# Patient Record
Sex: Female | Born: 1960
Health system: Southern US, Community
[De-identification: ages and names within clinical notes are randomized; demographics above are authoritative.]

## PROBLEM LIST (undated history)

## (undated) DIAGNOSIS — E785 Hyperlipidemia, unspecified: Secondary | ICD-10-CM

## (undated) DIAGNOSIS — M1990 Unspecified osteoarthritis, unspecified site: Secondary | ICD-10-CM

## (undated) DIAGNOSIS — J45909 Unspecified asthma, uncomplicated: Secondary | ICD-10-CM

## (undated) DIAGNOSIS — I471 Supraventricular tachycardia: Secondary | ICD-10-CM

## (undated) DIAGNOSIS — E1169 Type 2 diabetes mellitus with other specified complication: Secondary | ICD-10-CM

## (undated) DIAGNOSIS — I499 Cardiac arrhythmia, unspecified: Secondary | ICD-10-CM

## (undated) DIAGNOSIS — I491 Atrial premature depolarization: Secondary | ICD-10-CM

## (undated) DIAGNOSIS — I4719 Other supraventricular tachycardia: Secondary | ICD-10-CM

## (undated) DIAGNOSIS — I493 Ventricular premature depolarization: Secondary | ICD-10-CM

## (undated) DIAGNOSIS — G4733 Obstructive sleep apnea (adult) (pediatric): Secondary | ICD-10-CM

## (undated) DIAGNOSIS — I700 Atherosclerosis of aorta: Secondary | ICD-10-CM

## (undated) DIAGNOSIS — I2510 Atherosclerotic heart disease of native coronary artery without angina pectoris: Secondary | ICD-10-CM

## (undated) DIAGNOSIS — K760 Fatty (change of) liver, not elsewhere classified: Secondary | ICD-10-CM

## (undated) DIAGNOSIS — D219 Benign neoplasm of connective and other soft tissue, unspecified: Secondary | ICD-10-CM

## (undated) DIAGNOSIS — R0600 Dyspnea, unspecified: Secondary | ICD-10-CM

## (undated) DIAGNOSIS — Z9989 Dependence on other enabling machines and devices: Secondary | ICD-10-CM

## (undated) DIAGNOSIS — I10 Essential (primary) hypertension: Secondary | ICD-10-CM

## (undated) DIAGNOSIS — E669 Obesity, unspecified: Secondary | ICD-10-CM

## (undated) DIAGNOSIS — I5032 Chronic diastolic (congestive) heart failure: Secondary | ICD-10-CM

## (undated) HISTORY — DX: Obstructive sleep apnea (adult) (pediatric): Z99.89

## (undated) HISTORY — DX: Benign neoplasm of connective and other soft tissue, unspecified: D21.9

## (undated) HISTORY — DX: Atherosclerotic heart disease of native coronary artery without angina pectoris: I25.10

## (undated) HISTORY — PX: DILATION AND CURETTAGE OF UTERUS: SHX78

## (undated) HISTORY — DX: Fatty (change of) liver, not elsewhere classified: K76.0

## (undated) HISTORY — DX: Obesity, unspecified: E66.9

## (undated) HISTORY — DX: Morbid (severe) obesity due to excess calories: E66.01

## (undated) HISTORY — DX: Atrial premature depolarization: I49.1

## (undated) HISTORY — PX: OTHER SURGICAL HISTORY: SHX169

## (undated) HISTORY — PX: HYSTEROSCOPY: SHX211

## (undated) HISTORY — DX: Hyperlipidemia, unspecified: E78.5

## (undated) HISTORY — DX: Atherosclerosis of aorta: I70.0

## (undated) HISTORY — DX: Chronic diastolic (congestive) heart failure: I50.32

## (undated) HISTORY — DX: Unspecified asthma, uncomplicated: J45.909

## (undated) HISTORY — PX: TONSILLECTOMY: SUR1361

## (undated) HISTORY — DX: Other supraventricular tachycardia: I47.19

## (undated) HISTORY — DX: Supraventricular tachycardia: I47.1

## (undated) HISTORY — DX: Essential (primary) hypertension: I10

## (undated) HISTORY — DX: Type 2 diabetes mellitus with other specified complication: E11.69

## (undated) HISTORY — DX: Obstructive sleep apnea (adult) (pediatric): G47.33

## (undated) HISTORY — DX: Ventricular premature depolarization: I49.3

---

## 1998-02-10 ENCOUNTER — Other Ambulatory Visit: Admission: RE | Admit: 1998-02-10 | Discharge: 1998-02-10 | Payer: Self-pay | Admitting: Gynecology

## 1999-05-04 ENCOUNTER — Other Ambulatory Visit: Admission: RE | Admit: 1999-05-04 | Discharge: 1999-05-04 | Payer: Self-pay | Admitting: Gynecology

## 1999-05-05 ENCOUNTER — Encounter (INDEPENDENT_AMBULATORY_CARE_PROVIDER_SITE_OTHER): Payer: Self-pay

## 1999-05-05 ENCOUNTER — Ambulatory Visit (HOSPITAL_COMMUNITY): Admission: RE | Admit: 1999-05-05 | Discharge: 1999-05-05 | Payer: Self-pay | Admitting: Gynecology

## 1999-10-12 ENCOUNTER — Other Ambulatory Visit: Admission: RE | Admit: 1999-10-12 | Discharge: 1999-10-12 | Payer: Self-pay | Admitting: Gynecology

## 2000-05-04 ENCOUNTER — Other Ambulatory Visit: Admission: RE | Admit: 2000-05-04 | Discharge: 2000-05-04 | Payer: Self-pay | Admitting: Gynecology

## 2001-05-07 ENCOUNTER — Other Ambulatory Visit: Admission: RE | Admit: 2001-05-07 | Discharge: 2001-05-07 | Payer: Self-pay | Admitting: Gynecology

## 2002-05-08 ENCOUNTER — Other Ambulatory Visit: Admission: RE | Admit: 2002-05-08 | Discharge: 2002-05-08 | Payer: Self-pay | Admitting: Gynecology

## 2002-10-03 ENCOUNTER — Other Ambulatory Visit: Admission: RE | Admit: 2002-10-03 | Discharge: 2002-10-03 | Payer: Self-pay | Admitting: Gynecology

## 2003-05-12 ENCOUNTER — Other Ambulatory Visit: Admission: RE | Admit: 2003-05-12 | Discharge: 2003-05-12 | Payer: Self-pay | Admitting: Gynecology

## 2004-05-19 ENCOUNTER — Other Ambulatory Visit: Admission: RE | Admit: 2004-05-19 | Discharge: 2004-05-19 | Payer: Self-pay | Admitting: Gynecology

## 2004-08-09 ENCOUNTER — Encounter: Admission: RE | Admit: 2004-08-09 | Discharge: 2004-08-09 | Payer: Self-pay | Admitting: Orthopedic Surgery

## 2005-06-01 ENCOUNTER — Other Ambulatory Visit: Admission: RE | Admit: 2005-06-01 | Discharge: 2005-06-01 | Payer: Self-pay | Admitting: Gynecology

## 2006-06-02 ENCOUNTER — Other Ambulatory Visit: Admission: RE | Admit: 2006-06-02 | Discharge: 2006-06-02 | Payer: Self-pay | Admitting: Gynecology

## 2007-06-06 ENCOUNTER — Encounter: Admission: RE | Admit: 2007-06-06 | Discharge: 2007-06-06 | Payer: Self-pay | Admitting: Orthopedic Surgery

## 2007-07-20 ENCOUNTER — Other Ambulatory Visit: Admission: RE | Admit: 2007-07-20 | Discharge: 2007-07-20 | Payer: Self-pay | Admitting: Gynecology

## 2007-10-16 ENCOUNTER — Encounter: Payer: Self-pay | Admitting: Pulmonary Disease

## 2007-10-22 ENCOUNTER — Encounter: Payer: Self-pay | Admitting: Pulmonary Disease

## 2007-10-30 ENCOUNTER — Telehealth: Payer: Self-pay | Admitting: Pulmonary Disease

## 2007-12-09 ENCOUNTER — Encounter: Payer: Self-pay | Admitting: Pulmonary Disease

## 2007-12-10 ENCOUNTER — Encounter: Payer: Self-pay | Admitting: Pulmonary Disease

## 2008-01-22 ENCOUNTER — Encounter: Payer: Self-pay | Admitting: Pulmonary Disease

## 2008-01-31 ENCOUNTER — Ambulatory Visit: Payer: Self-pay | Admitting: Pulmonary Disease

## 2008-01-31 DIAGNOSIS — Q676 Pectus excavatum: Secondary | ICD-10-CM | POA: Insufficient documentation

## 2008-01-31 DIAGNOSIS — R0602 Shortness of breath: Secondary | ICD-10-CM | POA: Insufficient documentation

## 2008-02-06 ENCOUNTER — Telehealth (INDEPENDENT_AMBULATORY_CARE_PROVIDER_SITE_OTHER): Payer: Self-pay | Admitting: *Deleted

## 2008-02-18 ENCOUNTER — Telehealth (INDEPENDENT_AMBULATORY_CARE_PROVIDER_SITE_OTHER): Payer: Self-pay | Admitting: *Deleted

## 2008-03-26 ENCOUNTER — Ambulatory Visit: Payer: Self-pay | Admitting: Women's Health

## 2008-04-03 ENCOUNTER — Ambulatory Visit: Payer: Self-pay | Admitting: Women's Health

## 2008-05-06 ENCOUNTER — Ambulatory Visit: Payer: Self-pay | Admitting: Pulmonary Disease

## 2008-06-20 HISTORY — PX: DILATION AND CURETTAGE OF UTERUS: SHX78

## 2008-06-20 HISTORY — PX: HYSTEROSCOPY: SHX211

## 2008-06-20 HISTORY — PX: ABDOMINAL HYSTERECTOMY: SHX81

## 2008-07-28 ENCOUNTER — Ambulatory Visit: Payer: Self-pay | Admitting: Women's Health

## 2008-07-28 ENCOUNTER — Other Ambulatory Visit: Admission: RE | Admit: 2008-07-28 | Discharge: 2008-07-28 | Payer: Self-pay | Admitting: Gynecology

## 2008-07-28 ENCOUNTER — Encounter: Payer: Self-pay | Admitting: Women's Health

## 2008-08-07 ENCOUNTER — Ambulatory Visit: Payer: Self-pay | Admitting: Women's Health

## 2008-10-19 ENCOUNTER — Encounter: Payer: Self-pay | Admitting: Pulmonary Disease

## 2008-11-19 ENCOUNTER — Ambulatory Visit (HOSPITAL_COMMUNITY): Admission: RE | Admit: 2008-11-19 | Discharge: 2008-11-19 | Payer: Self-pay | Admitting: Obstetrics and Gynecology

## 2008-11-27 ENCOUNTER — Ambulatory Visit: Payer: Self-pay | Admitting: Pulmonary Disease

## 2008-11-27 ENCOUNTER — Telehealth (INDEPENDENT_AMBULATORY_CARE_PROVIDER_SITE_OTHER): Payer: Self-pay | Admitting: *Deleted

## 2008-12-01 ENCOUNTER — Telehealth (INDEPENDENT_AMBULATORY_CARE_PROVIDER_SITE_OTHER): Payer: Self-pay | Admitting: *Deleted

## 2008-12-03 ENCOUNTER — Ambulatory Visit: Payer: Self-pay | Admitting: Cardiology

## 2009-01-06 ENCOUNTER — Ambulatory Visit: Payer: Self-pay | Admitting: Pulmonary Disease

## 2009-01-15 ENCOUNTER — Telehealth: Payer: Self-pay | Admitting: Pulmonary Disease

## 2009-01-18 HISTORY — PX: OTHER SURGICAL HISTORY: SHX169

## 2009-02-13 ENCOUNTER — Ambulatory Visit: Payer: Self-pay | Admitting: Women's Health

## 2009-03-25 ENCOUNTER — Ambulatory Visit: Payer: Self-pay | Admitting: Women's Health

## 2009-06-18 ENCOUNTER — Ambulatory Visit: Payer: Self-pay | Admitting: Women's Health

## 2009-07-06 ENCOUNTER — Ambulatory Visit: Payer: Self-pay | Admitting: Women's Health

## 2010-01-15 ENCOUNTER — Encounter: Admission: RE | Admit: 2010-01-15 | Discharge: 2010-01-15 | Payer: Self-pay | Admitting: Obstetrics and Gynecology

## 2010-06-20 DIAGNOSIS — I5032 Chronic diastolic (congestive) heart failure: Secondary | ICD-10-CM

## 2010-06-20 HISTORY — DX: Chronic diastolic (congestive) heart failure: I50.32

## 2010-07-12 ENCOUNTER — Encounter: Payer: Self-pay | Admitting: Obstetrics and Gynecology

## 2010-07-20 NOTE — Progress Notes (Signed)
Summary: CXR reesults  Phone Note Call from Patient   Caller: Patient Call For: alva Summary of Call: req cxr results.  Initial call taken by: Tivis Ringer,  February 18, 2008 3:36 PM  Follow-up for Phone Call        let her know pectus was seen, heart size appears large as before, otherwise lungs look normal. Follow-up by: Comer Locket. Vassie Loll MD,  February 18, 2008 10:00 PM  Additional Follow-up for Phone Call Additional follow up Details #1::        Pt is aware of CXR results. Additional Follow-up by: Michel Bickers CMA,  February 19, 2008 9:39 AM

## 2010-07-22 ENCOUNTER — Other Ambulatory Visit: Payer: Self-pay | Admitting: Women's Health

## 2010-07-22 ENCOUNTER — Encounter: Payer: BC Managed Care – PPO | Admitting: Women's Health

## 2010-07-22 ENCOUNTER — Other Ambulatory Visit (HOSPITAL_COMMUNITY)
Admission: RE | Admit: 2010-07-22 | Discharge: 2010-07-22 | Disposition: A | Discharge status: Home / Self Care | Payer: BC Managed Care – PPO | Source: Ambulatory Visit | Attending: Gynecology | Admitting: Gynecology

## 2010-07-22 DIAGNOSIS — Z01419 Encounter for gynecological examination (general) (routine) without abnormal findings: Secondary | ICD-10-CM

## 2010-07-22 DIAGNOSIS — Z124 Encounter for screening for malignant neoplasm of cervix: Secondary | ICD-10-CM | POA: Insufficient documentation

## 2010-11-05 NOTE — Op Note (Signed)
[facility]  Patient:    Jaime Nichols                  MRN: [id number] Proc. Date: 05/05/99 Adm. Date:  [id number] Attending:  Merrily Pew                           Operative Report  PREOPERATIVE DIAGNOSIS:       Irregular endometrial cavity consistent with polyps.  POSTOPERATIVE DIAGNOSIS:      Endometrial hyperplastic-type pattern grossly.  OPERATION:                    Hysteroscopy, D&C.  SURGEON:                      Timothy P. Fontaine, M.D.  ASSISTANT:  ANESTHESIA:                   IV sedation with 1% lidocaine paracervical block.   SPECIMEN:                     Endometrial curettings.  ESTIMATED BLOOD LOSS:         Minimal.  SORBITOL DISCREPANCY:         Minimal.  COMPLICATIONS:                None.  FINDINGS:                     Endometrial cavity with exaggerated undulated pattern.  Peaked-up areas of endometrium.  No frank polyps.  Otherwise normal noting right and left tubal ostia visualized.  Fundus, anterior and posterior uterine surfaces, lower uterine segment, and endocervical canal visualized.  DESCRIPTION OF PROCEDURE:     The patient was taken to the operating room and placed in the supine position and received IV sedation.  She was subsequently placed in the low dorsal lithotomy position and received a perineal and vaginal  preparation with Betadine scrub and Betadine solution.  The bladder was emptied  with an in-and-out Foley catheterization.  EUA was performed and the patient was draped in the usual fashion.  The cervix was visualized, grasped with a single tooth tenaculum, and a paracervical block using 1% lidocaine a total of 10 cc placed.  The cervix was then gently and gradually dilated to admit the operative hysteroscope and hysteroscopy was performed.  A sharp curettage was performed.  Subsequent hysteroscopy showed emptying of the cavity without any residual abnormalities.  Good uterine  distention.  No evidence of perforation. Instruments were removed.  Tenaculum removed.  Adequate hemostasis visualized and the patient was subsequently taken to the recovery room in good condition having tolerated he procedure well. DD:  05/05/99 TD:  05/05/99 Job: [id number]

## 2010-12-27 ENCOUNTER — Other Ambulatory Visit: Payer: Self-pay | Admitting: Obstetrics and Gynecology

## 2010-12-27 DIAGNOSIS — Z1231 Encounter for screening mammogram for malignant neoplasm of breast: Secondary | ICD-10-CM

## 2011-02-15 ENCOUNTER — Ambulatory Visit: Payer: BC Managed Care – PPO

## 2011-02-17 ENCOUNTER — Ambulatory Visit
Admission: RE | Admit: 2011-02-17 | Discharge: 2011-02-17 | Disposition: A | Discharge status: Home / Self Care | Payer: BC Managed Care – PPO | Source: Ambulatory Visit | Attending: Obstetrics and Gynecology | Admitting: Obstetrics and Gynecology

## 2011-02-17 DIAGNOSIS — Z1231 Encounter for screening mammogram for malignant neoplasm of breast: Secondary | ICD-10-CM

## 2011-04-21 ENCOUNTER — Ambulatory Visit (INDEPENDENT_AMBULATORY_CARE_PROVIDER_SITE_OTHER): Payer: BC Managed Care – PPO | Admitting: Women's Health

## 2011-04-21 ENCOUNTER — Encounter: Payer: Self-pay | Admitting: Women's Health

## 2011-04-21 ENCOUNTER — Telehealth: Payer: Self-pay

## 2011-04-21 VITALS — BP 130/70

## 2011-04-21 DIAGNOSIS — B373 Candidiasis of vulva and vagina: Secondary | ICD-10-CM

## 2011-04-21 DIAGNOSIS — N898 Other specified noninflammatory disorders of vagina: Secondary | ICD-10-CM

## 2011-04-21 DIAGNOSIS — L293 Anogenital pruritus, unspecified: Secondary | ICD-10-CM

## 2011-04-21 DIAGNOSIS — E785 Hyperlipidemia, unspecified: Secondary | ICD-10-CM | POA: Insufficient documentation

## 2011-04-21 MED ORDER — FLUCONAZOLE 150 MG PO TABS: 150.0000 mg | ORAL_TABLET | Freq: Once | ORAL | Status: AC

## 2011-04-21 MED ORDER — NYSTATIN 100000 UNIT/GM EX CREA: TOPICAL_CREAM | Freq: Two times a day (BID) | CUTANEOUS | Status: DC

## 2011-04-21 NOTE — Progress Notes (Signed)
  Presents with a complaint of vaginal burning, with minimal discharge. Has had some problems with recurrent UTIs. States has not had increased pain with urination, denies any pain at the end of the stream. Denies fever.  Exam: UA negative. External genitalia erythematous at introitus. Speculum exam status post hysterectomy, vaginal walls are slightly erythematous, scant white discharge, wet prep positive for yeast.  Plan: Urine culture, nystatin cream to external genitalia twice daily as needed, Diflucan 150 by mouth x1 dose with a refill given. Yeast prevention discussed, instructed to call if no relief of symptoms.

## 2011-04-21 NOTE — Telephone Encounter (Signed)
Patient called in voice mail requesting RX for questionable BV symptoms. I called her back and got her voice mail and told her we recommended office visit to assess this before RX and she needed to call back and speak with appts. And schedule.

## 2011-04-22 ENCOUNTER — Other Ambulatory Visit: Payer: Self-pay | Admitting: Women's Health

## 2011-04-22 DIAGNOSIS — N390 Urinary tract infection, site not specified: Secondary | ICD-10-CM

## 2011-04-22 MED ORDER — SULFAMETHOXAZOLE-TRIMETHOPRIM 800-160 MG PO TABS: 1.0000 | ORAL_TABLET | Freq: Two times a day (BID) | ORAL | Status: AC

## 2011-04-25 ENCOUNTER — Telehealth: Payer: Self-pay | Admitting: *Deleted

## 2011-04-25 DIAGNOSIS — N390 Urinary tract infection, site not specified: Secondary | ICD-10-CM

## 2011-04-25 MED ORDER — CIPROFLOXACIN HCL 500 MG PO TABS: 500.0000 mg | ORAL_TABLET | Freq: Two times a day (BID) | ORAL | Status: AC

## 2011-04-25 NOTE — Telephone Encounter (Signed)
Patient called to say that the Bactrim rx given Friday is not really helping and wanted to know if she could be rx'd another prescription?

## 2011-04-25 NOTE — Telephone Encounter (Signed)
Amy, please call patient and called in Cipro 500 by mouth twice a day for 5 days #10. Please have her return to the office in 2 weeks for a test of cure UA.

## 2011-04-25 NOTE — Telephone Encounter (Signed)
Patient informed.  Order in pc.  Rx sent.

## 2011-04-25 NOTE — Telephone Encounter (Signed)
Jaime Nichols, she had 100,000 pure bacteria UTI. Please call in Cipro 500 by mouth twice a day for 5 days. Instructed her to return to the office in 2 weeks for a test of cure nUA. Have her call if this does not help her symptoms.

## 2011-08-04 ENCOUNTER — Other Ambulatory Visit: Payer: Self-pay | Admitting: Women's Health

## 2011-09-08 ENCOUNTER — Encounter: Payer: BC Managed Care – PPO | Admitting: Women's Health

## 2011-10-27 ENCOUNTER — Encounter: Payer: BC Managed Care – PPO | Admitting: Women's Health

## 2011-11-03 ENCOUNTER — Other Ambulatory Visit: Payer: Self-pay | Admitting: Dermatology

## 2011-11-23 ENCOUNTER — Encounter: Payer: BC Managed Care – PPO | Admitting: Women's Health

## 2012-03-05 ENCOUNTER — Other Ambulatory Visit: Payer: Self-pay | Admitting: Obstetrics and Gynecology

## 2012-03-05 DIAGNOSIS — Z1231 Encounter for screening mammogram for malignant neoplasm of breast: Secondary | ICD-10-CM

## 2012-03-20 ENCOUNTER — Ambulatory Visit
Admission: RE | Admit: 2012-03-20 | Discharge: 2012-03-20 | Disposition: A | Discharge status: Home / Self Care | Payer: BC Managed Care – PPO | Source: Ambulatory Visit | Attending: Obstetrics and Gynecology | Admitting: Obstetrics and Gynecology

## 2012-03-20 DIAGNOSIS — Z1231 Encounter for screening mammogram for malignant neoplasm of breast: Secondary | ICD-10-CM

## 2013-02-20 ENCOUNTER — Other Ambulatory Visit: Payer: Self-pay

## 2013-02-20 DIAGNOSIS — Z1231 Encounter for screening mammogram for malignant neoplasm of breast: Secondary | ICD-10-CM

## 2013-03-28 ENCOUNTER — Telehealth: Payer: Self-pay | Admitting: Cardiology

## 2013-03-28 NOTE — Telephone Encounter (Signed)
New Problem:  Pt states she wants a copy of her prescription for a C-pap machine. Pt states Dr. Mayford Knife stated  She could research C-pap then Dr Mayford Knife would approve it. Pt would like to see the Rx for her C-pap... Please  Advise. Pt states she can be contacted at home or on her mobile: [phone]

## 2013-04-01 ENCOUNTER — Ambulatory Visit
Admission: RE | Admit: 2013-04-01 | Discharge: 2013-04-01 | Disposition: A | Discharge status: Home / Self Care | Payer: BC Managed Care – PPO | Source: Ambulatory Visit

## 2013-04-01 DIAGNOSIS — Z1231 Encounter for screening mammogram for malignant neoplasm of breast: Secondary | ICD-10-CM

## 2013-04-01 NOTE — Telephone Encounter (Signed)
New Problem:  Pt request a call back of C pap machine prescription// Request a copy of the script for personal use.Marland Kitchen Please assist

## 2013-04-02 NOTE — Telephone Encounter (Signed)
Spoke w pt. She will call me tomorrow to let me know what cpap she chose for Dr. Mayford Knife to write an rx for and [place] send into [email or web address]

## 2013-04-03 ENCOUNTER — Telehealth: Payer: Self-pay | Admitting: Interventional Cardiology

## 2013-04-03 NOTE — Telephone Encounter (Signed)
New problem:   Pt states she is returning a call from [place] regarding her C-pap. Please advise

## 2013-04-03 NOTE — Telephone Encounter (Signed)
Pt wants her rx faxed to [phone] [facility].  Pt wants the Resmed airscent 10 for her.  At her last OV you had stated she could look up what CPAP machine she wanted and you would write a rx. This was stated at her last OV at [facility]. It is in [facility].

## 2013-04-04 NOTE — Telephone Encounter (Signed)
Prescription written

## 2013-04-04 NOTE — Telephone Encounter (Signed)
Faxed for pt.

## 2013-04-08 ENCOUNTER — Telehealth: Payer: Self-pay | Admitting: Cardiology

## 2013-04-08 NOTE — Telephone Encounter (Signed)
New PRobl;em  Pt states that the script for CPAP machine is not incorrect//No further details// Please call back to discuss.

## 2013-04-09 NOTE — Telephone Encounter (Signed)
Will route to [place] w/Dr. Mayford Knife

## 2013-04-09 NOTE — Telephone Encounter (Signed)
Follow up  Pt asked that you please call her back about her C pap scrprit

## 2013-04-09 NOTE — Telephone Encounter (Signed)
Pt needs a rx for her pressure to be continuous 12.6 like she had for her last machine. Ok to write and fax To [facility] [phone]

## 2013-04-10 NOTE — Telephone Encounter (Signed)
Sent to Medical records to be faxed over for pt.

## 2013-04-10 NOTE — Telephone Encounter (Signed)
Prescription done

## 2013-04-11 NOTE — Telephone Encounter (Signed)
Spoke with pt. We re-faxed CPAP rx to the [facility]AP shop. They stated they would call in the am if they still did not receive.

## 2013-04-11 NOTE — Telephone Encounter (Signed)
Follow up     Still do not have a correct presc for a cpap machine.  Pt is fustrated because the company still has not received the correct presc---Pls refax

## 2013-10-04 ENCOUNTER — Ambulatory Visit: Payer: BC Managed Care – PPO | Admitting: Interventional Cardiology

## 2013-11-21 ENCOUNTER — Ambulatory Visit: Payer: BC Managed Care – PPO | Admitting: Interventional Cardiology

## 2013-12-02 ENCOUNTER — Encounter: Payer: Self-pay | Admitting: Interventional Cardiology

## 2013-12-04 ENCOUNTER — Encounter: Payer: Self-pay | Admitting: Interventional Cardiology

## 2013-12-10 ENCOUNTER — Ambulatory Visit (INDEPENDENT_AMBULATORY_CARE_PROVIDER_SITE_OTHER): Payer: BC Managed Care – PPO | Admitting: Interventional Cardiology

## 2013-12-10 ENCOUNTER — Encounter: Payer: Self-pay | Admitting: Interventional Cardiology

## 2013-12-10 VITALS — BP 130/80 | HR 88 | Ht 63.0 in | Wt 284.0 lb

## 2013-12-10 DIAGNOSIS — I5032 Chronic diastolic (congestive) heart failure: Secondary | ICD-10-CM

## 2013-12-10 DIAGNOSIS — G4730 Sleep apnea, unspecified: Secondary | ICD-10-CM

## 2013-12-10 DIAGNOSIS — E785 Hyperlipidemia, unspecified: Secondary | ICD-10-CM

## 2013-12-10 NOTE — Patient Instructions (Signed)
Your physician recommends that you continue on your current medications as directed. Please refer to the Current Medication list given to you today.  Your physician has requested that you have an echocardiogram. Echocardiography is a painless test that uses sound waves to create images of your heart. It provides your doctor with information about the size and shape of your heart and how well your heart's chambers and valves are working. This procedure takes approximately one hour. There are no restrictions for this procedure.   Your physician wants you to follow-up in: 1 year You will receive a reminder letter in the mail two months in advance. If you don't receive a letter, please call our office to schedule the follow-up appointment.   

## 2013-12-10 NOTE — Progress Notes (Signed)
Patient ID: Jaime Nichols, female   DOB: 21-Sep-1960, 53 y.o.   MRN: [id number]    [address], [address] [place], [place]  [address] Phone: [phone] Fax:  [phone]  Date:  12/10/2013   ID:  Jaime Nichols, DOB 12-Apr-1961, MRN [id number]  PCP:  Gerrit Heck, MD   ASSESSMENT:  1. Diastolic heart failure, stable 2. Morbid obesity 3. Hypertension  PLAN:  1. 2-D Doppler echocardiogram to re-assess LV wall thickness and monitor LVH regression 2. No change in medical regimen at this time.   SUBJECTIVE: Jaime Nichols is a 53 y.o. female feels improved compared with the way she was doing last year. Tolerating metoprolol quite well.   Wt Readings from Last 3 Encounters:  12/10/13 284 lb (128.822 kg)  01/06/09 266 lb 4 oz (120.77 kg)  11/27/08 264 lb 6.1 oz (119.923 kg)     Past Medical History  Diagnosis Date  . Hypertension   . Hyperlipidemia   . Fibroid     Current Outpatient Prescriptions  Medication Sig Dispense Refill  . Calcium Citrate-Vitamin D (CALCIUM CITRATE + PO) Take 630 mg by mouth daily.      . Cholecalciferol (VITAMIN D3 PO) Take 2,500 Units by mouth daily.      . Glucosamine-Chondroit-Vit C-Mn (GLUCOSAMINE CHONDR 1500 COMPLX) CAPS Take 1,500 capsules by mouth daily.      Marland Kitchen lisinopril-hydrochlorothiazide (PRINZIDE,ZESTORETIC) 10-12.5 MG per tablet Take 1 tablet by mouth.       . metFORMIN (GLUCOPHAGE) 500 MG tablet Take 500 mg by mouth 2 (two) times daily with a meal.      . metoprolol succinate (TOPROL-XL) 25 MG 24 hr tablet Take 25 mg by mouth daily.       . Multiple Vitamin (MULTIVITAMIN) tablet Take 1 tablet by mouth daily.        . Oxycodone-Acetaminophen (MAGNACET PO) Take 250 mg by mouth daily.      . Probiotic Product (PROBIOTIC DAILY PO) Take by mouth daily.      . rosuvastatin (CRESTOR) 10 MG tablet Take 10 mg by mouth daily.         No current facility-administered medications for this visit.     Allergies:    Allergies  Allergen Reactions  . Latex     Social History:  The patient  reports that she has never smoked. She has never used smokeless tobacco. She reports that she drinks alcohol. She reports that she does not use illicit drugs.   ROS:  Please see the history of present illness.   Has not lost weight. Not exercising.   All other systems reviewed and negative.   OBJECTIVE: VS:  BP 130/80  Pulse 88  Ht 5\' 3"  (1.6 m)  Wt 284 lb (128.822 kg)  BMI 50.32 kg/m2 Well nourished, well developed, in no acute distress, obese HEENT: normal Neck: JVD unable to visualize due to short thick neck. Carotid bruit absent  Cardiac:  normal S1, S2; RRR; no murmur Lungs:  clear to auscultation bilaterally, no wheezing, rhonchi or rales Abd: soft, nontender, no hepatomegaly Ext: Edema trace. Pulses 2+ Skin: warm and dry Neuro:  CNs 2-12 intact, no focal abnormalities noted  EKG:  Normal sinus rhythm with left axis deviation   .    Signed, Illene Labrador III, MD 12/10/2013 4:26 PM

## 2013-12-25 ENCOUNTER — Other Ambulatory Visit: Payer: Self-pay | Admitting: *Deleted

## 2013-12-25 MED ORDER — METOPROLOL SUCCINATE ER 25 MG PO TB24: 25.0000 mg | ORAL_TABLET | Freq: Every day | ORAL | Status: DC

## 2013-12-30 ENCOUNTER — Ambulatory Visit (HOSPITAL_COMMUNITY): Payer: BC Managed Care – PPO | Attending: Cardiology | Admitting: Cardiology

## 2013-12-30 DIAGNOSIS — I10 Essential (primary) hypertension: Secondary | ICD-10-CM | POA: Insufficient documentation

## 2013-12-30 DIAGNOSIS — I509 Heart failure, unspecified: Secondary | ICD-10-CM | POA: Insufficient documentation

## 2013-12-30 DIAGNOSIS — I5032 Chronic diastolic (congestive) heart failure: Secondary | ICD-10-CM

## 2013-12-30 DIAGNOSIS — I517 Cardiomegaly: Secondary | ICD-10-CM | POA: Insufficient documentation

## 2013-12-30 DIAGNOSIS — E785 Hyperlipidemia, unspecified: Secondary | ICD-10-CM | POA: Insufficient documentation

## 2013-12-30 DIAGNOSIS — I079 Rheumatic tricuspid valve disease, unspecified: Secondary | ICD-10-CM | POA: Insufficient documentation

## 2013-12-30 NOTE — Progress Notes (Signed)
Echo performed. 

## 2014-02-06 ENCOUNTER — Ambulatory Visit: Payer: BC Managed Care – PPO | Admitting: Cardiology

## 2014-02-07 ENCOUNTER — Ambulatory Visit: Payer: BC Managed Care – PPO | Admitting: Cardiology

## 2014-02-21 ENCOUNTER — Ambulatory Visit: Payer: BC Managed Care – PPO | Admitting: Cardiology

## 2014-03-28 ENCOUNTER — Other Ambulatory Visit: Payer: Self-pay

## 2014-03-28 DIAGNOSIS — Z1239 Encounter for other screening for malignant neoplasm of breast: Secondary | ICD-10-CM

## 2014-04-10 ENCOUNTER — Other Ambulatory Visit: Payer: Self-pay

## 2014-04-10 DIAGNOSIS — Z1231 Encounter for screening mammogram for malignant neoplasm of breast: Secondary | ICD-10-CM

## 2014-04-16 ENCOUNTER — Ambulatory Visit
Admission: RE | Admit: 2014-04-16 | Discharge: 2014-04-16 | Disposition: A | Discharge status: Home / Self Care | Payer: BC Managed Care – PPO | Source: Ambulatory Visit

## 2014-04-16 DIAGNOSIS — Z1231 Encounter for screening mammogram for malignant neoplasm of breast: Secondary | ICD-10-CM

## 2014-04-21 ENCOUNTER — Encounter: Payer: Self-pay | Admitting: Interventional Cardiology

## 2014-04-22 ENCOUNTER — Ambulatory Visit (INDEPENDENT_AMBULATORY_CARE_PROVIDER_SITE_OTHER): Payer: BC Managed Care – PPO | Admitting: Cardiology

## 2014-04-22 ENCOUNTER — Encounter: Payer: Self-pay | Admitting: Cardiology

## 2014-04-22 VITALS — BP 140/80 | HR 103 | Ht 63.0 in | Wt 270.8 lb

## 2014-04-22 DIAGNOSIS — I10 Essential (primary) hypertension: Secondary | ICD-10-CM | POA: Insufficient documentation

## 2014-04-22 DIAGNOSIS — G4730 Sleep apnea, unspecified: Secondary | ICD-10-CM

## 2014-04-22 NOTE — Patient Instructions (Signed)
Your physician wants you to follow-up in: 1 year with Dr. Radford Pax. You will receive a reminder letter in the mail two months in advance. If you don't receive a letter, please call our office to schedule the follow-up appointment.  Your physician recommends that you continue on your current medications as directed. Please refer to the Current Medication list given to you today.

## 2014-04-22 NOTE — Progress Notes (Signed)
[place] [place], [place]  [address] Phone: [phone] Fax:  [phone]  Date:  04/22/2014   ID:  Jaime Nichols, DOB May 11, 1961, MRN [id number]  PCP:  Gerrit Heck, MD  Cardiologist:  Fransico Him, MD    History of Present Illness: This is a 53yo female with a history of OSA, obesity and HTN who presents today for followup.  She is doing well.  She tolerates her CPAP therapy without problems.  She tolerates the nasal pillow mask and feels the pressure is adequate.  She feels rested in the am and has no daytime sleepiness.  She does not get any aerobic exercise.   Wt Readings from Last 3 Encounters:  04/22/14 270 lb 12.8 oz (122.834 kg)  12/10/13 284 lb (128.822 kg)  01/06/09 266 lb 4 oz (120.77 kg)     Past Medical History  Diagnosis Date  . Hypertension   . Hyperlipidemia   . Fibroid     Current Outpatient Prescriptions  Medication Sig Dispense Refill  . Calcium Citrate-Vitamin D (CALCIUM CITRATE + PO) Take 630 mg by mouth daily.    . Cholecalciferol (VITAMIN D3 PO) Take 2,500 Units by mouth daily.    . Glucosamine-Chondroit-Vit C-Mn (GLUCOSAMINE CHONDR 1500 COMPLX) CAPS Take 1,500 capsules by mouth daily.    Marland Kitchen lisinopril-hydrochlorothiazide (PRINZIDE,ZESTORETIC) 10-12.5 MG per tablet Take 1 tablet by mouth.     . metFORMIN (GLUCOPHAGE) 500 MG tablet Take 500 mg by mouth 2 (two) times daily with a meal.    . metoprolol succinate (TOPROL-XL) 25 MG 24 hr tablet Take 1 tablet (25 mg total) by mouth daily. 30 tablet 5  . Multiple Vitamin (MULTIVITAMIN) tablet Take 1 tablet by mouth daily.      . Probiotic Product (PROBIOTIC DAILY PO) Take by mouth daily.    . rosuvastatin (CRESTOR) 10 MG tablet Take 10 mg by mouth daily.       No current facility-administered medications for this visit.    Allergies:    Allergies  Allergen Reactions  . Latex     Social History:  The patient  reports that she has never smoked. She has never used  smokeless tobacco. She reports that she drinks alcohol. She reports that she does not use illicit drugs.   Family History:  The patient's family history includes Diabetes in her paternal grandmother; Heart disease in her father; Hypertension in her mother.   ROS:  Please see the history of present illness.      All other systems reviewed and negative.   PHYSICAL EXAM: VS:  BP 140/80 mmHg  Pulse 103  Ht 5\' 3"  (1.6 m)  Wt 270 lb 12.8 oz (122.834 kg)  BMI 47.98 kg/m2 Well nourished, well developed, in no acute distress HEENT: normal Neck: no JVD Cardiac:  normal S1, S2; RRR; no murmur Lungs:  clear to auscultation bilaterally, no wheezing, rhonchi or rales Abd: soft, nontender, no hepatomegaly Ext: no edema Skin: warm and dry Neuro:  CNs 2-12 intact, no focal abnormalities noted  EKG:  NSR at 95bpm with LAD and poor R wave transition      ASSESSMENT AND PLAN:  1. OSA on CPAP and tolerating well.  Her d/l today showed an AHI of 0.4/hr on 12.6cm H2O and 99% compliance in using more than 4 hours nightly.  She will continue CPAP at 12cm H2O. 2. Obesity  - I have strongly encouraged her to get into an exercise program to try to lose weight  3. HTN - well controlled.  Continue Prinizide and Toprol  Followup with me in 1 year  Signed, Fransico Him, MD [facility] 04/22/2014 4:07 PM

## 2014-04-22 NOTE — Addendum Note (Signed)
Addended by: Harland German A on: 04/22/2014 05:38 PM   Modules accepted: Orders

## 2014-06-25 ENCOUNTER — Other Ambulatory Visit: Payer: Self-pay | Admitting: *Deleted

## 2014-06-25 MED ORDER — METOPROLOL SUCCINATE ER 25 MG PO TB24: 25.0000 mg | ORAL_TABLET | Freq: Every day | ORAL | Status: DC

## 2014-12-17 NOTE — Progress Notes (Signed)
Cardiology Office Note   Date:  12/18/2014   ID:  Jaime Nichols, DOB 29-Jul-1960, MRN [id number]  PCP:  Gerrit Heck, MD  Cardiologist:  Sinclair Grooms, MD   Chief Complaint  Patient presents with  . Congestive Heart Failure      History of Present Illness: Jaime Nichols is a 54 y.o. female who presents for morbid obesity, diastolic dysfunction, and hypertension. She still feels well. Beta blocker therapy has been a tremendous improvement for her. She feels that she had more impact early on after starting the beta blocker than she is now. She feels hot. She has some shortness of breath. She is terrified of increasing the dose of the beta blocker.    Past Medical History  Diagnosis Date  . Hypertension   . Hyperlipidemia   . Fibroid     Past Surgical History  Procedure Laterality Date  . Abdominal hysterectomy  2010    BSO  . Dilation and curettage of uterus    . Hysteroscopy    . Benign uterine polyps  01/2009     Current Outpatient Prescriptions  Medication Sig Dispense Refill  . Calcium Citrate-Vitamin D (CALCIUM CITRATE + PO) Take 630 mg by mouth daily.    . Cholecalciferol (VITAMIN D3 PO) Take 2,500 Units by mouth daily.    Marland Kitchen CLINPRO 5000 1.1 % PSTE Take 1 application by mouth 2 (two) times daily.  10  . Glucosamine-Chondroit-Vit C-Mn (GLUCOSAMINE CHONDR 1500 COMPLX) CAPS Take 1,500 capsules by mouth daily.    Marland Kitchen lisinopril-hydrochlorothiazide (PRINZIDE,ZESTORETIC) 10-12.5 MG per tablet Take 1 tablet by mouth.     . metFORMIN (GLUCOPHAGE) 500 MG tablet Take 500 mg by mouth 2 (two) times daily with a meal.    . metoprolol succinate (TOPROL-XL) 25 MG 24 hr tablet Take 1 tablet (25 mg total) by mouth daily. 30 tablet 5  . Multiple Vitamin (MULTIVITAMIN) tablet Take 1 tablet by mouth daily.      . rosuvastatin (CRESTOR) 10 MG tablet Take 10 mg by mouth daily.      Marland Kitchen sulfamethoxazole-trimethoprim (BACTRIM DS,SEPTRA DS) 800-160 MG per  tablet Take 1 tablet by mouth 2 (two) times daily.  0   No current facility-administered medications for this visit.    Allergies:   Latex    Social History:  The patient  reports that she has never smoked. She has never used smokeless tobacco. She reports that she drinks alcohol. She reports that she does not use illicit drugs.   Family History:  The patient's family history includes Depression in her brother; Diabetes in her paternal grandmother; Healthy in her sister and sister; Heart disease in her father; Hypertension in her mother.    ROS:  Please see the history of present illness.   Otherwise, review of systems are positive for none.   All other systems are reviewed and negative.    PHYSICAL EXAM: VS:  BP 110/82 mmHg  Pulse 87  Ht 5\' 3"  (1.6 m)  Wt 126.009 kg (277 lb 12.8 oz)  BMI 49.22 kg/m2  SpO2 96% , BMI Body mass index is 49.22 kg/(m^2). GEN: Well nourished, well developed, in no acute distress HEENT: normal Neck: no JVD, carotid bruits, or masses Cardiac: RRR; no murmurs, rubs, or gallops,no edema  Respiratory:  clear to auscultation bilaterally, normal work of breathing GI: soft, nontender, nondistended, + BS MS: no deformity or atrophy Skin: warm and dry, no rash Neuro:  Strength and sensation are intact Psych:  euthymic mood, full affect   EKG:  EKG is not ordered today.    Recent Labs: No results found for requested labs within last 365 days.    Lipid Panel No results found for: CHOL, TRIG, HDL, CHOLHDL, VLDL, LDLCALC, LDLDIRECT    Wt Readings from Last 3 Encounters:  12/18/14 126.009 kg (277 lb 12.8 oz)  04/22/14 122.834 kg (270 lb 12.8 oz)  12/10/13 128.822 kg (284 lb)      Other studies Reviewed: Additional studies/ records that were reviewed today include: .    ASSESSMENT AND PLAN:  1. Chronic diastolic heart failure No evidence of volume overload  2. Essential hypertension Controlled  3. Hyperlipidemia Followed by primary  care  4. Morbid obesity Significant  5. Sleep apnea     Current medicines are reviewed at length with the patient today.  The patient does not have concerns regarding medicines.  The following changes have been made:  no change.   She is having some mild shortness of breath. She feels her heart rate is faster than it has been on the beta blocker. We may need to upwardly adjust her dose over time. We discussed this but decided to leave her regimen stable for the time being.  Labs/ tests ordered today include:  No orders of the defined types were placed in this encounter.     Disposition:   FU with HS in 1 year  Signed, Sinclair Grooms, MD  12/18/2014 12:29 PM    [address], [employer], [place]  [address] Phone: [phone]; Fax: [phone]

## 2014-12-18 ENCOUNTER — Encounter: Payer: Self-pay | Admitting: Interventional Cardiology

## 2014-12-18 ENCOUNTER — Ambulatory Visit (INDEPENDENT_AMBULATORY_CARE_PROVIDER_SITE_OTHER): Payer: BLUE CROSS/BLUE SHIELD | Admitting: Interventional Cardiology

## 2014-12-18 VITALS — BP 110/82 | HR 87 | Ht 63.0 in | Wt 277.8 lb

## 2014-12-18 DIAGNOSIS — G4730 Sleep apnea, unspecified: Secondary | ICD-10-CM

## 2014-12-18 DIAGNOSIS — I5032 Chronic diastolic (congestive) heart failure: Secondary | ICD-10-CM | POA: Diagnosis not present

## 2014-12-18 DIAGNOSIS — I10 Essential (primary) hypertension: Secondary | ICD-10-CM

## 2014-12-18 DIAGNOSIS — E785 Hyperlipidemia, unspecified: Secondary | ICD-10-CM

## 2014-12-18 NOTE — Patient Instructions (Signed)
Medication Instructions:  Your physician recommends that you continue on your current medications as directed. Please refer to the Current Medication list given to you today.   Labwork: None   Testing/Procedures: None   Follow-Up: Your physician wants you to follow-up in: 1 year with Dr.Smith You will receive a reminder letter in the mail two months in advance. If you don't receive a letter, please call our office to schedule the follow-up appointment.   Any Other Special Instructions Will Be Listed Below (If Applicable). Your physician discussed the importance of regular exercise and recommended that you start or continue a regular exercise program for good health.  Low-Sodium Eating Plan Sodium raises blood pressure and causes water to be held in the body. Getting less sodium from food will help lower your blood pressure, reduce any swelling, and protect your heart, liver, and kidneys. We get sodium by adding salt (sodium chloride) to food. Most of our sodium comes from canned, boxed, and frozen foods. Restaurant foods, fast foods, and pizza are also very high in sodium. Even if you take medicine to lower your blood pressure or to reduce fluid in your body, getting less sodium from your food is important. WHAT IS MY PLAN? Most people should limit their sodium intake to 2,300 mg a day. Your health care provider recommends that you limit your sodium intake to __________ a day.  WHAT DO I NEED TO KNOW ABOUT THIS EATING PLAN? For the low-sodium eating plan, you will follow these general guidelines:  Choose foods with a % Daily Value for sodium of less than 5% (as listed on the food label).   Use salt-free seasonings or herbs instead of table salt or sea salt.   Check with your health care provider or pharmacist before using salt substitutes.   Eat fresh foods.  Eat more vegetables and fruits.  Limit canned vegetables. If you do use them, rinse them well to decrease the sodium.    Limit cheese to 1 oz (28 g) per day.   Eat lower-sodium products, often labeled as "lower sodium" or "no salt added."  Avoid foods that contain monosodium glutamate (MSG). MSG is sometimes added to [place] food and some canned foods.  Check food labels (Nutrition Facts labels) on foods to learn how much sodium is in one serving.  Eat more home-cooked food and less restaurant, buffet, and fast food.  When eating at a restaurant, ask that your food be prepared with less salt or none, if possible.  HOW DO I READ FOOD LABELS FOR SODIUM INFORMATION? The Nutrition Facts label lists the amount of sodium in one serving of the food. If you eat more than one serving, you must multiply the listed amount of sodium by the number of servings. Food labels may also identify foods as:  Sodium free--Less than 5 mg in a serving.  Very low sodium--35 mg or less in a serving.  Low sodium--140 mg or less in a serving.  Light in sodium--50% less sodium in a serving. For example, if a food that usually has 300 mg of sodium is changed to become light in sodium, it will have 150 mg of sodium.  Reduced sodium--25% less sodium in a serving. For example, if a food that usually has 400 mg of sodium is changed to reduced sodium, it will have 300 mg of sodium. WHAT FOODS CAN I EAT? Grains Low-sodium cereals, including oats, puffed wheat and rice, and shredded wheat cereals. Low-sodium crackers. Unsalted rice and pasta. Lower-sodium  bread.  Vegetables Frozen or fresh vegetables. Low-sodium or reduced-sodium canned vegetables. Low-sodium or reduced-sodium tomato sauce and paste. Low-sodium or reduced-sodium tomato and vegetable juices.  Fruits Fresh, frozen, and canned fruit. Fruit juice.  Meat and Other Protein Products Low-sodium canned tuna and salmon. Fresh or frozen meat, poultry, seafood, and fish. Lamb. Unsalted nuts. Dried beans, peas, and lentils without added salt. Unsalted canned beans.  Homemade soups without salt. Eggs.  Dairy Milk. Soy milk. Ricotta cheese. Low-sodium or reduced-sodium cheeses. Yogurt.  Condiments Fresh and dried herbs and spices. Salt-free seasonings. Onion and garlic powders. Low-sodium varieties of mustard and ketchup. Lemon juice.  Fats and Oils Reduced-sodium salad dressings. Unsalted butter.  Other Unsalted popcorn and pretzels.  The items listed above may not be a complete list of recommended foods or beverages. Contact your dietitian for more options. WHAT FOODS ARE NOT RECOMMENDED? Grains Instant hot cereals. Bread stuffing, pancake, and biscuit mixes. Croutons. Seasoned rice or pasta mixes. Noodle soup cups. Boxed or frozen macaroni and cheese. Self-rising flour. Regular salted crackers. Vegetables Regular canned vegetables. Regular canned tomato sauce and paste. Regular tomato and vegetable juices. Frozen vegetables in sauces. Salted french fries. Olives. Angie Fava. Relishes. Sauerkraut. Salsa. Meat and Other Protein Products Salted, canned, smoked, spiced, or pickled meats, seafood, or fish. Bacon, ham, sausage, hot dogs, corned beef, chipped beef, and packaged luncheon meats. Salt pork. Jerky. Pickled herring. Anchovies, regular canned tuna, and sardines. Salted nuts. Dairy Processed cheese and cheese spreads. Cheese curds. Blue cheese and cottage cheese. Buttermilk.  Condiments Onion and garlic salt, seasoned salt, table salt, and sea salt. Canned and packaged gravies. Worcestershire sauce. Tartar sauce. Barbecue sauce. Teriyaki sauce. Soy sauce, including reduced sodium. Steak sauce. Fish sauce. Oyster sauce. Cocktail sauce. Horseradish. Regular ketchup and mustard. Meat flavorings and tenderizers. Bouillon cubes. Hot sauce. Tabasco sauce. Marinades. Taco seasonings. Relishes. Fats and Oils Regular salad dressings. Salted butter. Margarine. Ghee. Bacon fat.  Other Potato and tortilla chips. Corn chips and puffs. Salted popcorn  and pretzels. Canned or dried soups. Pizza. Frozen entrees and pot pies.  The items listed above may not be a complete list of foods and beverages to avoid. Contact your dietitian for more information. Document Released: 11/26/2001 Document Revised: 06/11/2013 Document Reviewed: 04/10/2013 [facility] Patient Information 2015 [place], [place]. This information is not intended to replace advice given to you by your health care provider. Make sure you discuss any questions you have with your health care provider.

## 2014-12-24 ENCOUNTER — Other Ambulatory Visit: Payer: Self-pay

## 2014-12-24 MED ORDER — METOPROLOL SUCCINATE ER 25 MG PO TB24: 25.0000 mg | ORAL_TABLET | Freq: Every day | ORAL | Status: DC

## 2015-01-28 ENCOUNTER — Encounter: Payer: Self-pay | Admitting: Cardiology

## 2015-03-16 ENCOUNTER — Other Ambulatory Visit: Payer: Self-pay

## 2015-03-16 MED ORDER — METOPROLOL SUCCINATE ER 25 MG PO TB24: 25.0000 mg | ORAL_TABLET | Freq: Every day | ORAL | Status: DC

## 2015-03-16 NOTE — Telephone Encounter (Signed)
Patient requesting 90 day supply  Belva Crome, MD at 12/17/2014 7:14 PM  metoprolol succinate (TOPROL-XL) 25 MG 24 hr tabletTake 1 tablet (25 mg total) by mouth daily Current medicines are reviewed at length with the patient today. The patient does not have concerns regarding medicines. The following changes have been made: no change.

## 2015-04-10 ENCOUNTER — Other Ambulatory Visit: Payer: Self-pay

## 2015-04-10 DIAGNOSIS — Z1231 Encounter for screening mammogram for malignant neoplasm of breast: Secondary | ICD-10-CM

## 2015-04-30 ENCOUNTER — Ambulatory Visit (INDEPENDENT_AMBULATORY_CARE_PROVIDER_SITE_OTHER): Payer: BLUE CROSS/BLUE SHIELD | Admitting: Cardiology

## 2015-04-30 ENCOUNTER — Encounter: Payer: Self-pay | Admitting: Cardiology

## 2015-04-30 VITALS — BP 118/82 | HR 94 | Ht 63.0 in | Wt 275.6 lb

## 2015-04-30 DIAGNOSIS — G4730 Sleep apnea, unspecified: Secondary | ICD-10-CM

## 2015-04-30 DIAGNOSIS — I10 Essential (primary) hypertension: Secondary | ICD-10-CM

## 2015-04-30 NOTE — Patient Instructions (Signed)

## 2015-04-30 NOTE — Progress Notes (Signed)
Cardiology Office Note   Date:  04/30/2015   ID:  Jaime Nichols, DOB [birth year], MRN [id number]  PCP:  Gerrit Heck, MD    Chief Complaint  Patient presents with  . Sleep Apnea      History of Present Illness: This is a 54yo female with a history of OSA, obesity and HTN who presents today for followup. She is doing well. She tolerates her CPAP therapy without problems. She tolerates the nasal pillow mask and feels the pressure is adequate. She feels rested in the am and has no daytime sleepiness. She does not get any aerobic exercise.     Past Medical History  Diagnosis Date  . Hypertension   . Hyperlipidemia   . Fibroid     Past Surgical History  Procedure Laterality Date  . Abdominal hysterectomy  2010    BSO  . Dilation and curettage of uterus    . Hysteroscopy    . Benign uterine polyps  01/2009     Current Outpatient Prescriptions  Medication Sig Dispense Refill  . Calcium Citrate-Vitamin D (CALCIUM CITRATE + PO) Take 630 mg by mouth daily.    . Cholecalciferol (VITAMIN D3 PO) Take 2,500 Units by mouth daily.    Marland Kitchen CLINPRO 5000 1.1 % PSTE Take 1 application by mouth 2 (two) times daily.  10  . Glucosamine-Chondroit-Vit C-Mn (GLUCOSAMINE CHONDR 1500 COMPLX) CAPS Take 1,500 capsules by mouth daily.    Marland Kitchen lisinopril-hydrochlorothiazide (PRINZIDE,ZESTORETIC) 10-12.5 MG per tablet Take 1 tablet by mouth.     . metFORMIN (GLUCOPHAGE) 500 MG tablet Take 500 mg by mouth 2 (two) times daily with a meal.    . metoprolol succinate (TOPROL-XL) 25 MG 24 hr tablet Take 1 tablet (25 mg total) by mouth daily. 90 tablet 2  . Multiple Vitamin (MULTIVITAMIN) tablet Take 1 tablet by mouth daily.      . rosuvastatin (CRESTOR) 10 MG tablet Take 10 mg by mouth daily.       No current facility-administered medications for this visit.    Allergies:   Latex    Social History:  The patient  reports that she has never smoked. She has never  used smokeless tobacco. She reports that she drinks alcohol. She reports that she does not use illicit drugs.   Family History:  The patient's family history includes Depression in her brother; Diabetes in her paternal grandmother; Healthy in her sister and sister; Heart disease in her father; Hypertension in her mother.    ROS:  Please see the history of present illness.   Otherwise, review of systems are positive for none.   All other systems are reviewed and negative.    PHYSICAL EXAM: VS:  BP 118/82 mmHg  Pulse 94  Ht 5\' 3"  (1.6 m)  Wt 275 lb 9.6 oz (125.011 kg)  BMI 48.83 kg/m2 , BMI Body mass index is 48.83 kg/(m^2). GEN: Well nourished, well developed, in no acute distress HEENT: normal Neck: no JVD, carotid bruits, or masses Cardiac: RRR; no murmurs, rubs, or gallops,no edema  Respiratory:  clear to auscultation bilaterally, normal work of breathing GI: soft, nontender, nondistended, + BS MS: no deformity or atrophy Skin: warm and dry, no rash Neuro:  Strength and sensation are intact Psych: euthymic mood, full affect   EKG:  EKG was ordered today showing NSR with LAD and IRBBB and septal infarct    Recent Labs:  No results found for requested labs within last 365 days.    Lipid Panel No results found for: CHOL, TRIG, HDL, CHOLHDL, VLDL, LDLCALC, LDLDIRECT    Wt Readings from Last 3 Encounters:  04/30/15 275 lb 9.6 oz (125.011 kg)  12/18/14 277 lb 12.8 oz (126.009 kg)  04/22/14 270 lb 12.8 oz (122.834 kg)    ASSESSMENT AND PLAN:  1. OSA on CPAP and tolerating well. Her d/l today showed an AHI of 0.4/hr on 12.6cm H2O and 100% compliance in using more than 4 hours nightly. She will continue CPAP at 12cm H2O. 2. Obesity - I have strongly encouraged her to get into an exercise program to try to lose weight 3. HTN - well controlled. Continue Prinizide and Toprol     Current medicines are reviewed at length with the patient today.  The patient does not have  concerns regarding medicines.  The following changes have been made:  no change  Labs/ tests ordered today: See above Assessment and Plan No orders of the defined types were placed in this encounter.     Disposition:   FU with me in 1 year  Signed, Sueanne Margarita, MD  04/30/2015 2:58 PM    [address], [place], [place]  [address] Phone: [phone]; Fax: [phone]

## 2015-05-13 ENCOUNTER — Encounter: Payer: Self-pay | Admitting: Cardiology

## 2015-05-19 ENCOUNTER — Ambulatory Visit
Admission: RE | Admit: 2015-05-19 | Discharge: 2015-05-19 | Disposition: A | Discharge status: Home / Self Care | Payer: BLUE CROSS/BLUE SHIELD | Source: Ambulatory Visit

## 2015-05-19 DIAGNOSIS — Z1231 Encounter for screening mammogram for malignant neoplasm of breast: Secondary | ICD-10-CM

## 2015-06-26 ENCOUNTER — Telehealth: Payer: Self-pay | Admitting: *Deleted

## 2015-06-26 MED ORDER — FLUCONAZOLE 150 MG PO TABS: 150.0000 mg | ORAL_TABLET | Freq: Once | ORAL | Status: DC

## 2015-06-26 NOTE — Telephone Encounter (Signed)
Per Jaime Nichols staff message to nancy young:   Jaime Gala! Good morning.  Help help help :) Look who called........last in for ce w/u was feb 2012 and last in for problem visit was nov 2012.  i explained to her about new pt all over again. And she is dealing w/a yeast inf? And has tried moistat and requesting diflucan. i told her all i could do is ask you and she does have a pcp dr Drema [place], said its hard to get in there.  She did make her appt for ce on jan 25  You decide and let me know   Jaime Gala young response:  Best if office visit with Dr Phineas Real or Dr Toney Rakes, if unable to get her in - [place] for diflucan 150mg  and office visit is no relief  Jaime Nichols called and pt and she states unable to come in so Rx will be sent per nancy above note.

## 2015-06-27 ENCOUNTER — Other Ambulatory Visit: Payer: Self-pay | Admitting: Women's Health

## 2015-06-27 DIAGNOSIS — B3731 Acute candidiasis of vulva and vagina: Secondary | ICD-10-CM

## 2015-06-27 DIAGNOSIS — B373 Candidiasis of vulva and vagina: Secondary | ICD-10-CM

## 2015-06-27 MED ORDER — FLUCONAZOLE 150 MG PO TABS: 150.0000 mg | ORAL_TABLET | Freq: Once | ORAL | Status: DC

## 2015-06-30 ENCOUNTER — Ambulatory Visit: Payer: Self-pay | Admitting: Women's Health

## 2015-07-14 ENCOUNTER — Encounter: Payer: Self-pay | Admitting: Women's Health

## 2015-07-14 ENCOUNTER — Ambulatory Visit (INDEPENDENT_AMBULATORY_CARE_PROVIDER_SITE_OTHER): Payer: BLUE CROSS/BLUE SHIELD | Admitting: Women's Health

## 2015-07-14 VITALS — BP 128/80 | Ht 63.0 in | Wt 271.0 lb

## 2015-07-14 DIAGNOSIS — Z01419 Encounter for gynecological examination (general) (routine) without abnormal findings: Secondary | ICD-10-CM | POA: Diagnosis not present

## 2015-07-14 DIAGNOSIS — E119 Type 2 diabetes mellitus without complications: Secondary | ICD-10-CM | POA: Insufficient documentation

## 2015-07-14 DIAGNOSIS — B373 Candidiasis of vulva and vagina: Secondary | ICD-10-CM | POA: Diagnosis not present

## 2015-07-14 DIAGNOSIS — B3731 Acute candidiasis of vulva and vagina: Secondary | ICD-10-CM

## 2015-07-14 LAB — URINALYSIS W MICROSCOPIC + REFLEX CULTURE
Bilirubin Urine: NEGATIVE
Casts: NONE SEEN [LPF]
Crystals: NONE SEEN [HPF]
Hgb urine dipstick: NEGATIVE
LEUKOCYTES UA: NEGATIVE
NITRITE: POSITIVE — AB
PH: 5 (ref 5.0–8.0)

## 2015-07-14 LAB — WET PREP FOR TRICH, YEAST, CLUE
CLUE CELLS WET PREP: NONE SEEN
TRICH WET PREP: NONE SEEN

## 2015-07-14 MED ORDER — FLUCONAZOLE 150 MG PO TABS: ORAL_TABLET | ORAL | Status: DC

## 2015-07-14 NOTE — Patient Instructions (Addendum)
Menopause is a normal process in which your reproductive ability comes to an end. This process happens gradually over a span of months to years, usually between the ages of 48 and 55. Menopause is complete when you have missed 12 consecutive menstrual periods. It is important to talk with your health care provider about some of the most common conditions that affect postmenopausal women, such as heart disease, cancer, and bone loss (osteoporosis). Adopting a healthy lifestyle and getting preventive care can help to promote your health and wellness. Those actions can also lower your chances of developing some of these common conditions. WHAT SHOULD I KNOW ABOUT MENOPAUSE? During menopause, you may experience a number of symptoms, such as:  Moderate-to-severe hot flashes.  Night sweats.  Decrease in sex drive.  Mood swings.  Headaches.  Tiredness.  Irritability.  Memory problems.  Insomnia. Choosing to treat or not to treat menopausal changes is an individual decision that you make with your health care provider. WHAT SHOULD I KNOW ABOUT HORMONE REPLACEMENT THERAPY AND SUPPLEMENTS? Hormone therapy products are effective for treating symptoms that are associated with menopause, such as hot flashes and night sweats. Hormone replacement carries certain risks, especially as you become older. If you are thinking about using estrogen or estrogen with progestin treatments, discuss the benefits and risks with your health care provider. WHAT SHOULD I KNOW ABOUT HEART DISEASE AND STROKE? Heart disease, heart attack, and stroke become more likely as you age. This may be due, in part, to the hormonal changes that your body experiences during menopause. These can affect how your body processes dietary fats, triglycerides, and cholesterol. Heart attack and stroke are both medical emergencies. There are many things that you can do to help prevent heart disease and stroke:  Have your blood pressure  checked at least every 1-2 years. High blood pressure causes heart disease and increases the risk of stroke.  If you are 55-79 years old, ask your health care provider if you should take aspirin to prevent a heart attack or a stroke.  Do not use any tobacco products, including cigarettes, chewing tobacco, or electronic cigarettes. If you need help quitting, ask your health care provider.  It is important to eat a healthy diet and maintain a healthy weight.  Be sure to include plenty of vegetables, fruits, low-fat dairy products, and lean protein.  Avoid eating foods that are high in solid fats, added sugars, or salt (sodium).  Get regular exercise. This is one of the most important things that you can do for your health.  Try to exercise for at least 150 minutes each week. The type of exercise that you do should increase your heart rate and make you sweat. This is known as moderate-intensity exercise.  Try to do strengthening exercises at least twice each week. Do these in addition to the moderate-intensity exercise.  Know your numbers.Ask your health care provider to check your cholesterol and your blood glucose. Continue to have your blood tested as directed by your health care provider. WHAT SHOULD I KNOW ABOUT CANCER SCREENING? There are several types of cancer. Take the following steps to reduce your risk and to catch any cancer development as early as possible. Breast Cancer  Practice breast self-awareness.  This means understanding how your breasts normally appear and feel.  It also means doing regular breast self-exams. Let your health care provider know about any changes, no matter how small.  If you are 40 or older, have a clinician do a   breast exam (clinical breast exam or CBE) every year. Depending on your age, family history, and medical history, it may be recommended that you also have a yearly breast X-ray (mammogram).  If you have a family history of breast cancer,  talk with your health care provider about genetic screening.  If you are at high risk for breast cancer, talk with your health care provider about having an MRI and a mammogram every year.  Breast cancer (BRCA) gene test is recommended for women who have family members with BRCA-related cancers. Results of the assessment will determine the need for genetic counseling and BRCA1 and for BRCA2 testing. BRCA-related cancers include these types:  Breast. This occurs in males or females.  Ovarian.  Tubal. This may also be called fallopian tube cancer.  Cancer of the abdominal or pelvic lining (peritoneal cancer).  Prostate.  Pancreatic. Cervical, Uterine, and Ovarian Cancer Your health care provider may recommend that you be screened regularly for cancer of the pelvic organs. These include your ovaries, uterus, and vagina. This screening involves a pelvic exam, which includes checking for microscopic changes to the surface of your cervix (Pap test).  For women ages 21-65, health care providers may recommend a pelvic exam and a Pap test every three years. For women ages 77-65, they may recommend the Pap test and pelvic exam, combined with testing for human papilloma virus (HPV), every five years. Some types of HPV increase your risk of cervical cancer. Testing for HPV may also be done on women of any age who have unclear Pap test results.  Other health care providers may not recommend any screening for nonpregnant women who are considered low risk for pelvic cancer and have no symptoms. Ask your health care provider if a screening pelvic exam is right for you.  If you have had past treatment for cervical cancer or a condition that could lead to cancer, you need Pap tests and screening for cancer for at least 20 years after your treatment. If Pap tests have been discontinued for you, your risk factors (such as having a new sexual partner) need to be reassessed to determine if you should start having  screenings again. Some women have medical problems that increase the chance of getting cervical cancer. In these cases, your health care provider may recommend that you have screening and Pap tests more often.  If you have a family history of uterine cancer or ovarian cancer, talk with your health care provider about genetic screening.  If you have vaginal bleeding after reaching menopause, tell your health care provider.  There are currently no reliable tests available to screen for ovarian cancer. Lung Cancer Lung cancer screening is recommended for adults 3-70 years old who are at high risk for lung cancer because of a history of smoking. A yearly low-dose CT scan of the lungs is recommended if you:  Currently smoke.  Have a history of at least 30 pack-years of smoking and you currently smoke or have quit within the past 15 years. A pack-year is smoking an average of one pack of cigarettes per day for one year. Yearly screening should:  Continue until it has been 15 years since you quit.  Stop if you develop a health problem that would prevent you from having lung cancer treatment. Colorectal Cancer  This type of cancer can be detected and can often be prevented.  Routine colorectal cancer screening usually begins at age 38 and continues through age 12.  If you have  risk factors for colon cancer, your health care provider may recommend that you be screened at an earlier age.  If you have a family history of colorectal cancer, talk with your health care provider about genetic screening.  Your health care provider may also recommend using home test kits to check for hidden blood in your stool.  A small camera at the end of a tube can be used to examine your colon directly (sigmoidoscopy or colonoscopy). This is done to check for the earliest forms of colorectal cancer.  Direct examination of the colon should be repeated every 5-10 years until age 67. However, if early forms of  precancerous polyps or small growths are found or if you have a family history or genetic risk for colorectal cancer, you may need to be screened more often. Skin Cancer  Check your skin from head to toe regularly.  Monitor any moles. Be sure to tell your health care provider:  About any new moles or changes in moles, especially if there is a change in a mole's shape or color.  If you have a mole that is larger than the size of a pencil eraser.  If any of your family members has a history of skin cancer, especially at a young age, talk with your health care provider about genetic screening.  Always use sunscreen. Apply sunscreen liberally and repeatedly throughout the day.  Whenever you are outside, protect yourself by wearing long sleeves, pants, a wide-brimmed hat, and sunglasses. WHAT SHOULD I KNOW ABOUT OSTEOPOROSIS? Osteoporosis is a condition in which bone destruction happens more quickly than new bone creation. After menopause, you may be at an increased risk for osteoporosis. To help prevent osteoporosis or the bone fractures that can happen because of osteoporosis, the following is recommended:  If you are 39-61 years old, get at least 1,000 mg of calcium and at least 600 mg of vitamin D per day.  If you are older than age 16 but younger than age 7, get at least 1,200 mg of calcium and at least 600 mg of vitamin D per day.  If you are older than age 47, get at least 1,200 mg of calcium and at least 800 mg of vitamin D per day. Smoking and excessive alcohol intake increase the risk of osteoporosis. Eat foods that are rich in calcium and vitamin D, and do weight-bearing exercises several times each week as directed by your health care provider. WHAT SHOULD I KNOW ABOUT HOW MENOPAUSE AFFECTS Jaime Nichols? Depression may occur at any age, but it is more common as you become older. Common symptoms of depression include:  Low or sad mood.  Changes in sleep patterns.  Changes  in appetite or eating patterns.  Feeling an overall lack of motivation or enjoyment of activities that you previously enjoyed.  Frequent crying spells. Talk with your health care provider if you think that you are experiencing depression. WHAT SHOULD I KNOW ABOUT IMMUNIZATIONS? It is important that you get and maintain your immunizations. These include:  Tetanus, diphtheria, and pertussis (Tdap) booster vaccine.  Influenza every year before the flu season begins.  Pneumonia vaccine.  Shingles vaccine. Your health care provider may also recommend other immunizations.   This information is not intended to replace advice given to you by your health care provider. Make sure you discuss any questions you have with your health care provider.   Document Released: 07/29/2005 Document Revised: 06/27/2014 Document Reviewed: 02/06/2014 Elsevier Interactive Patient Education 2016 Elsevier  Inc[employer] Basic Carbohydrate Counting for Diabetes Mellitus Carbohydrate counting is a method for keeping track of the amount of carbohydrates you eat. Eating carbohydrates naturally increases the level of sugar (glucose) in your blood, so it is important for you to know the amount that is okay for you to have in every meal. Carbohydrate counting helps keep the level of glucose in your blood within normal limits. The amount of carbohydrates allowed is different for every person. A dietitian can help you calculate the amount that is right for you. Once you know the amount of carbohydrates you can have, you can count the carbohydrates in the foods you want to eat. Carbohydrates are found in the following foods:  Grains, such as breads and cereals.  Dried beans and soy products.  Starchy vegetables, such as potatoes, peas, and corn.  Fruit and fruit juices.  Milk and yogurt.  Sweets and snack foods, such as cake, cookies, candy, chips, soft drinks, and fruit drinks. CARBOHYDRATE COUNTING There are two ways to  count the carbohydrates in your food. You can use either of the methods or a combination of both. Reading the "Nutrition Facts" on [place] The "Nutrition Facts" is an area that is included on the labels of almost all packaged food and beverages in the [place]. It includes the serving size of that food or beverage and information about the nutrients in each serving of the food, including the grams (g) of carbohydrate per serving.  Decide the number of servings of this food or beverage that you will be able to eat or drink. Multiply that number of servings by the number of grams of carbohydrate that is listed on the label for that serving. The total will be the amount of carbohydrates you will be having when you eat or drink this food or beverage. Learning Standard Serving Sizes of Food When you eat food that is not packaged or does not include "Nutrition Facts" on the label, you need to measure the servings in order to count the amount of carbohydrates.A serving of most carbohydrate-rich foods contains about 15 g of carbohydrates. The following list includes serving sizes of carbohydrate-rich foods that provide 15 g ofcarbohydrate per serving:   1 slice of bread (1 oz) or 1 six-inch tortilla.    of a hamburger bun or English muffin.  4-6 crackers.   cup unsweetened dry cereal.    cup hot cereal.   cup rice or pasta.    cup mashed potatoes or  of a large baked potato.  1 cup fresh fruit or one small piece of fruit.    cup canned or frozen fruit or fruit juice.  1 cup milk.   cup plain fat-free yogurt or yogurt sweetened with artificial sweeteners.   cup cooked dried beans or starchy vegetable, such as peas, corn, or potatoes.  Decide the number of standard-size servings that you will eat. Multiply that number of servings by 15 (the grams of carbohydrates in that serving). For example, if you eat 2 cups of strawberries, you will have eaten 2 servings and 30 g of  carbohydrates (2 servings x 15 g = 30 g). For foods such as soups and casseroles, in which more than one food is mixed in, you will need to count the carbohydrates in each food that is included. EXAMPLE OF CARBOHYDRATE COUNTING Sample Dinner  3 oz chicken breast.   cup of brown rice.   cup of corn.  1 cup milk.   1 cup  strawberries with sugar-free whipped topping.  Carbohydrate Calculation Step 1: Identify the foods that contain carbohydrates:   Rice.   Corn.   Milk.   Strawberries. Step 2:Calculate the number of servings eaten of each:   2 servings of rice.   1 serving of corn.   1 serving of milk.   1 serving of strawberries. Step 3: Multiply each of those number of servings by 15 g:   2 servings of rice x 15 g = 30 g.   1 serving of corn x 15 g = 15 g.   1 serving of milk x 15 g = 15 g.   1 serving of strawberries x 15 g = 15 g. Step 4: Add together all of the amounts to find the total grams of carbohydrates eaten: 30 g + 15 g + 15 g + 15 g = 75 g.   This information is not intended to replace advice given to you by your health care provider. Make sure you discuss any questions you have with your health care provider.   Document Released: 06/06/2005 Document Revised: 06/27/2014 Document Reviewed: 05/03/2013 Elsevier Interactive Patient Education 2016 Elsevier Inc. Monilial Vaginitis Vaginitis in a soreness, swelling and redness (inflammation) of the vagina and vulva. Monilial vaginitis is not a sexually transmitted infection. CAUSES  Yeast vaginitis is caused by yeast (candida) that is normally found in your vagina. With a yeast infection, the candida has overgrown in number to a point that upsets the chemical balance. SYMPTOMS   White, thick vaginal discharge.  Swelling, itching, redness and irritation of the vagina and possibly the lips of the vagina (vulva).  Burning or painful urination.  Painful intercourse. DIAGNOSIS  Things that  may contribute to monilial vaginitis are:  Postmenopausal and virginal states.  Pregnancy.  Infections.  Being tired, sick or stressed, especially if you had monilial vaginitis in the past.  Diabetes. Good control will help lower the chance.  Birth control pills.  Tight fitting garments.  Using bubble bath, feminine sprays, douches or deodorant tampons.  Taking certain medications that kill germs (antibiotics).  Sporadic recurrence can occur if you become ill. TREATMENT  Your caregiver will give you medication.  There are several kinds of anti monilial vaginal creams and suppositories specific for monilial vaginitis. For recurrent yeast infections, use a suppository or cream in the vagina 2 times a week, or as directed.  Anti-monilial or steroid cream for the itching or irritation of the vulva may also be used. Get your caregiver's permission.  Painting the vagina with methylene blue solution may help if the monilial cream does not work.  Eating yogurt may help prevent monilial vaginitis. HOME CARE INSTRUCTIONS   Finish all medication as prescribed.  Do not have sex until treatment is completed or after your caregiver tells you it is okay.  Take warm sitz baths.  Do not douche.  Do not use tampons, especially scented ones.  Wear cotton underwear.  Avoid tight pants and panty hose.  Tell your sexual partner that you have a yeast infection. They should go to their caregiver if they have symptoms such as mild rash or itching.  Your sexual partner should be treated as well if your infection is difficult to eliminate.  Practice safer sex. Use condoms.  Some vaginal medications cause latex condoms to fail. Vaginal medications that harm condoms are:  Cleocin cream.  Butoconazole (Femstat).  Terconazole (Terazol) vaginal suppository.  Miconazole (Monistat) (may be purchased over the counter). SEEK MEDICAL CARE  IF:   You have a temperature by mouth above 102  F (38.9 C).  The infection is getting worse after 2 days of treatment.  The infection is not getting better after 3 days of treatment.  You develop blisters in or around your vagina.  You develop vaginal bleeding, and it is not your menstrual period.  You have pain when you urinate.  You develop intestinal problems.  You have pain with sexual intercourse.   This information is not intended to replace advice given to you by your health care provider. Make sure you discuss any questions you have with your health care provider.   Document Released: 03/16/2005 Document Revised: 08/29/2011 Document Reviewed: 12/08/2014 Elsevier Interactive Patient Education 2016 Elsevier Inc. Sleeve Gastrectomy A sleeve gastrectomy is a surgery in which a large portion of the stomach is removed. After the surgery, the stomach will be a narrow tube about the size of a banana. This surgery is performed to help a person lose weight. The person loses weight because the reduced size of the stomach restricts the amount of food that the person can eat. The stomach will hold much less food than before the surgery. Also, the part of the stomach that is removed produces a hormone that causes hunger.  This surgery is done for people who have morbid obesity, defined as a body mass index (BMI) greater than 40. BMI is an estimate of body fat and is calculated from the height and weight of a person. This surgery may also be done for people with a BMI between 35 and 40 if they have other diseases, such as type 2 diabetes mellitus, obstructive sleep apnea, or heart and lung disorders (cardiopulmonary diseases).  LET [facility] CARE PROVIDER KNOW ABOUT:  Any allergies you have.   All medicines you are taking, including vitamins, herbs, eyedrops, creams, and over-the-counter medicines.   Use of steroids (by mouth or creams).   Previous problems you or members of your family have had with the use of anesthetics.   Any  blood disorders you have.   Previous surgeries you have had.   Possibility of pregnancy, if this applies.   Other health problems you have. RISKS AND COMPLICATIONS Generally, sleeve gastrectomy is a safe procedure. However, as with any procedure, complications can occur. Possible complications include:  Infection.  Bleeding.  Blood clots.  Damage to other organs or tissue.  Leakage of fluid from the stomach into the abdominal cavity (rare). BEFORE THE PROCEDURE  You may need to have blood tests and imaging tests (such as X-rays or ultrasonography) done before the day of surgery. A test to evaluate your esophagus and how it moves (esophageal manometry) may also be done.  You may be placed on a liquid diet 2-3 weeks before the surgery.  Ask your health care provider about changing or stopping your regular medicines.  Do not eat or drink anything for at least 8 hours before the procedure.   Make plans to have someone drive you home after your hospital stay. Also arrange for someone to help you with activities during recovery. PROCEDURE  A laparoscopic technique is usually used for this surgery:  You will be given medicine to make you sleep through the procedure (general anesthetic). This medicine will be given through an intravenous (IV) access tube that is put into one of your veins.  Once you are asleep, your abdomen will be cleaned and sterilized.  Several small incisions will be made in your abdomen.  Your abdomen will be filled with air so that it expands. This gives the surgeon more room to operate and makes your organs easier to see.  A thin, lighted tube with a tiny camera on the end (laparoscope) is put through a small incision in your abdomen. The camera on the laparoscope sends a picture to a TV screen in the operating room. This gives the surgeon a good view inside the abdomen.  Hollow tubes are put through the other small incisions in your abdomen. The tools  needed for the procedure are put through these tubes.  The surgeon uses staples to divide part of the stomach and then removes it through one of the incisions.  The remaining stomach may be reinforced using stitches or surgical glue or both to prevent leakage of the stomach contents. A small tube (drain) may be placed through one of the incisions to allow extra fluid to flow from the area.  The incisions are closed with stitches, staples, or glue. AFTER THE PROCEDURE  You will be monitored closely in a recovery area. Once the anesthetic has worn off, you will likely be moved to a regular hospital room.  You will be given medicine for pain and nausea.   You may have a drain from one of the incisions in your abdomen. If a drain is used, it may stay in place after you go home from the hospital and be removed at a follow-up appointment.   You will be encouraged to walk around several times a day. This helps prevent blood clots.  You will be started on a liquid diet the first day after your surgery. Sometimes a test is done to check for leaking before you can eat.  You will be urged to cough and do deep breathing exercises. This helps prevent a lung infection after a surgery.  You will likely need to stay in the hospital for a few days.    This information is not intended to replace advice given to you by your health care provider. Make sure you discuss any questions you have with your health care provider.   Document Released: 04/03/2009 Document Revised: 02/06/2013 Document Reviewed: 11/28/2014 Elsevier Interactive Patient Education [employer].

## 2015-07-14 NOTE — Progress Notes (Signed)
Jaime Nichols 1960/12/17 [id number]    History:    Presents for annual exam.  Postmenopausal on no HRT, 2010 TAH with BSO for benign endometrial polyps done at [facility]. Hypertension, hypercholesteremia primary care manages. Negative colonoscopy at age 55. Normal Pap and mammogram history. Chronic knee pain has follow-up scheduled.  Past medical history, past surgical history, family history and social history were all reviewed and documented in the EPIC chart. One daughter PharmD, youngest daughter graduating with the degree in graphic design this year. Both doing well. Mother hypertension, father heart disease. Homemaker.  ROS:  A ROS was performed and pertinent positives and negatives are included.  Exam:  Filed Vitals:   07/14/15 1430  BP: 128/80    General appearance:  Normal Thyroid:  Symmetrical, normal in size, without palpable masses or nodularity. Respiratory  Auscultation:  Clear without wheezing or rhonchi Cardiovascular  Auscultation:  Regular rate, without rubs, murmurs or gallops  Edema/varicosities:  Not grossly evident Abdominal  Soft,nontender, without masses, guarding or rebound.  Liver/spleen:  No organomegaly noted  Hernia:  None appreciated  Skin  Inspection:  Grossly normal   Breasts: Examined lying and sitting.     Right: Without masses, retractions, discharge or axillary adenopathy.     Left: Without masses, retractions, discharge or axillary adenopathy. Gentitourinary   Inguinal/mons:  Normal without inguinal adenopathy  External genitalia:  Erythematous  BUS/Urethra/Skene's glands:  Normal  Vagina:  Erythematous, wet prep positive for yeast  Cervix:  Uterus absent Adnexa/parametria:     Rt: Without masses or tenderness.   Lt: Without masses or tenderness.  Anus and perineum: Normal  Digital rectal exam: Normal sphincter tone without palpated masses or tenderness  Assessment/Plan:  55 y.o. MWF G2 P2 for annual exam with complaint of vaginal  irritation with itching.  2010 TAH with BSO on no HRT Yeast vaginitis Reports osteopenia DEXA at primary care no meds Hypertension/hypercholesterolemia/diabetes-primary care manages labs and meds Morbid obesity Chronic knee pain-with. Follow-up scheduled  Plan: Diflucan 150 by mouth today, repeat in 3 days. Prescription, proper use given and reviewed. Instructed to apply A and D ointment externally has stress incontinence which is added to the irritation. Yeast prevention discussed. Reviewed diet, diabetes control help to prevent yeast. SBE's, continue annual screening mammogram normal November 2016. Follow-up DEXA at primary care. Reviewed importance of weight loss in relationship to chronic knee pain and helping chronic disease prevention. Home safety, fall prevention reviewed.   Huel Cote [facility], 5:07 PM 07/14/2015

## 2015-07-15 ENCOUNTER — Ambulatory Visit: Payer: Self-pay | Admitting: Women's Health

## 2015-07-17 ENCOUNTER — Other Ambulatory Visit: Payer: Self-pay | Admitting: Women's Health

## 2015-07-17 ENCOUNTER — Telehealth: Payer: Self-pay

## 2015-07-17 LAB — URINE CULTURE

## 2015-07-17 MED ORDER — CIPROFLOXACIN HCL 250 MG PO TABS: 250.0000 mg | ORAL_TABLET | Freq: Two times a day (BID) | ORAL | Status: DC

## 2015-07-17 NOTE — Telephone Encounter (Signed)
Jaime Nichols, Sensitivity is final now and the type of bacteria changed on final culture. Just wanted you to review before I sent the Septra in. Thanks

## 2015-07-17 NOTE — Telephone Encounter (Signed)
Septra is effective, good catch, Cipro I think would be better  Cipro 250 twice daily for 3 days.

## 2015-07-17 NOTE — Telephone Encounter (Signed)
Called in to pharmacy and patient was informed.

## 2015-07-17 NOTE — Telephone Encounter (Signed)
Left message to call me.

## 2015-07-17 NOTE — Telephone Encounter (Signed)
Checking on urine results from earlier this week.

## 2015-07-17 NOTE — Telephone Encounter (Signed)
Urine culture is positive for E coli, sensitivity is not back, septra bid for 3 days

## 2015-07-20 ENCOUNTER — Telehealth: Payer: Self-pay | Admitting: *Deleted

## 2015-07-20 NOTE — Telephone Encounter (Signed)
Rx for Cipro 250 mg x 3 days on 07/16/14 called today asking if Rx could be extended, today is last dose of Rx and still has symptoms. Please advise

## 2015-07-21 ENCOUNTER — Telehealth: Payer: Self-pay | Admitting: *Deleted

## 2015-07-21 ENCOUNTER — Other Ambulatory Visit: Payer: Self-pay | Admitting: Women's Health

## 2015-07-21 DIAGNOSIS — N3000 Acute cystitis without hematuria: Secondary | ICD-10-CM

## 2015-07-21 MED ORDER — CIPROFLOXACIN HCL 250 MG PO TABS: 250.0000 mg | ORAL_TABLET | Freq: Two times a day (BID) | ORAL | Status: DC

## 2015-07-21 MED ORDER — CIPROFLOXACIN HCL 500 MG PO TABS: 500.0000 mg | ORAL_TABLET | Freq: Two times a day (BID) | ORAL | Status: DC

## 2015-07-21 NOTE — Telephone Encounter (Signed)
Pt called yesterday c/o symptoms no better requesting extended dose of cipro 250 mg # 6 tablets was sent. Pt then called back again asking if maybe she should switch to Cipro 500 mg x 7 days? Since the 250 mg dose doesn't seem to help. [phone] if needed. Pt just wants you thoughts. Please advise

## 2015-07-21 NOTE — Telephone Encounter (Signed)
TC to review request, history of recurrent UTIs was having 5-6 yearly and would have to use Cipro 500 for 7 days last year only one, this year only one. States feels 25-30% better will do Cipro 500 for 7 days twice a day and do a test of cure UA after completing medication. Aware of UTI prevention.

## 2015-07-21 NOTE — Telephone Encounter (Signed)
Left on voicemail Rx sent,  °

## 2015-07-21 NOTE — Telephone Encounter (Signed)
Ok repeat for 3 more days

## 2015-07-24 ENCOUNTER — Encounter: Payer: Self-pay | Admitting: Cardiology

## 2015-07-24 ENCOUNTER — Telehealth: Payer: Self-pay | Admitting: *Deleted

## 2015-07-24 MED ORDER — FLUCONAZOLE 150 MG PO TABS: 150.0000 mg | ORAL_TABLET | Freq: Once | ORAL | Status: DC

## 2015-07-24 MED ORDER — AMOXICILLIN-POT CLAVULANATE 500-125 MG PO TABS: 1.0000 | ORAL_TABLET | Freq: Two times a day (BID) | ORAL | Status: DC

## 2015-07-24 NOTE — Telephone Encounter (Signed)
Pt called back to follow up telephone counter 07/21/15 states she is on day 3 of Cipro 500 mg and is waiting for it to get worse. Pt asked if maybe she should be switched to another sensitive medication? [id number]

## 2015-07-24 NOTE — Telephone Encounter (Signed)
Pt informed Rx's sent.

## 2015-07-24 NOTE — Telephone Encounter (Signed)
Okay, stop Cipro, Augmentin 500 mg by mouth twice a day for 5 days. Also Diflucan 150 times one dose, often penicillin-based antibiotics can cause yeast infections.

## 2015-08-10 ENCOUNTER — Telehealth: Payer: Self-pay | Admitting: *Deleted

## 2015-08-10 NOTE — Telephone Encounter (Signed)
Pt called c/o vaginal infection ,took diflucan tablet and no relief, I advised pt to make OV with provider.

## 2015-08-11 ENCOUNTER — Encounter: Payer: Self-pay | Admitting: Gynecology

## 2015-08-11 ENCOUNTER — Ambulatory Visit (INDEPENDENT_AMBULATORY_CARE_PROVIDER_SITE_OTHER): Payer: BLUE CROSS/BLUE SHIELD | Admitting: Gynecology

## 2015-08-11 VITALS — BP 120/74

## 2015-08-11 DIAGNOSIS — N61 Mastitis without abscess: Secondary | ICD-10-CM

## 2015-08-11 DIAGNOSIS — N39498 Other specified urinary incontinence: Secondary | ICD-10-CM | POA: Diagnosis not present

## 2015-08-11 DIAGNOSIS — R300 Dysuria: Secondary | ICD-10-CM

## 2015-08-11 DIAGNOSIS — N898 Other specified noninflammatory disorders of vagina: Secondary | ICD-10-CM | POA: Diagnosis not present

## 2015-08-11 LAB — URINALYSIS W MICROSCOPIC + REFLEX CULTURE
Bilirubin Urine: NEGATIVE
Casts: NONE SEEN [LPF]
Crystals: NONE SEEN [HPF]
Ketones, ur: NEGATIVE
LEUKOCYTES UA: NEGATIVE
Nitrite: NEGATIVE
PH: 5 (ref 5.0–8.0)
PROTEIN: NEGATIVE
Specific Gravity, Urine: 1.015 (ref 1.001–1.035)
WBC, UA: NONE SEEN WBC/HPF (ref <=5)
YEAST: NONE SEEN [HPF]

## 2015-08-11 LAB — WET PREP FOR TRICH, YEAST, CLUE
CLUE CELLS WET PREP: NONE SEEN
TRICH WET PREP: NONE SEEN
WBC, Wet Prep HPF POC: NONE SEEN
Yeast Wet Prep HPF POC: NONE SEEN

## 2015-08-11 MED ORDER — TERCONAZOLE 0.4 % VA CREA: 1.0000 | TOPICAL_CREAM | Freq: Every day | VAGINAL | Status: DC

## 2015-08-11 MED ORDER — NYSTATIN-TRIAMCINOLONE 100000-0.1 UNIT/GM-% EX OINT: 1.0000 "application " | TOPICAL_OINTMENT | Freq: Two times a day (BID) | CUTANEOUS | Status: DC

## 2015-08-11 NOTE — Progress Notes (Signed)
Jaime Nichols 01/10/1961 [id number]        55 y.o.  G2P2 Presents with vaginal irritation. Has been treated for recurrent UTIs and now has irritation with slight discharge. Took a Diflucan several days ago but the irritation has continued. Also notes a red area on her right breast times several days. She notes that she has recurrent right breast red areas that she thinks are infected hair follicles. Comes and goes.  Past medical history,surgical history, problem list, medications, allergies, family history and social history were all reviewed and documented in the EPIC chart.  Directed ROS with pertinent positives and negatives documented in the history of present illness/assessment and plan.  Exam: Gilman Schmidt assistant Filed Vitals:   08/11/15 1455  BP: 120/74   General appearance:  Normal Both breasts examined with right breast showing a 2 cm erythematous folliculitis type area [id number] position several finger breasts off areola. Otherwise without masses, retractions, discharge or adenopathy. Left breast without masses, retractions, discharge, adenopathy Abdomen soft nontender without masses guarding rebound Pelvic external BUS vagina with generalized erythematous vulvitis from periclitoral to perianal region. Vagina without significant discharge. Bimanual without gross masses or tenderness noting exam limited by abdominal girth  Assessment/Plan:  55 y.o. G2P2 with:  1. Erythematous vulvitis suggestive of yeast. Wet prep is unremarkable. We'll cover with Terazol 7 day cream internally 7 nights. Mycolog cream externally 2-3 times daily. Follow up if symptoms continue or recur. 2. Recent treatment for UTI. Urinalysis with 20-40 RBCs with few bacteria. Patient notes she does not think it's a clean-catch urine due to her incontinence. I suspect it is secondary to contamination from the skin irritation.  Will follow up with culture and treat accordingly. Recommended clean-catch repeat  urinalysis in several weeks.  If continues with blood in her urine then urology referral. 3. Urinary incontinence. Has been for years. Had seen urology who recommended weight loss first before surgery. Patient has failed to do so. Offered appointment now with urologist but declined at this point. She will call if she wants referral. 4. Erythematous area right breast consistent with folliculitis. Reports having recurrent episodes different places of the right breast. Nothing ever persists.  Recent mammogram in November. Recommend heat to the area as long as it totally resolves and will follow. If persists or certainly worsen sent follow up for further evaluation.    Anastasio Auerbach MD, 3:45 PM 08/11/2015

## 2015-08-11 NOTE — Patient Instructions (Signed)
Use the terconazole vaginal cream nightly for 7 nights. You see other prescribed cream externally twice daily for the outside rash Apply heat to the red area of the right breast. Call if this area persists. Repeat a clean catch urinalysis in several weeks to make sure the blood in the urine clears.

## 2015-08-12 LAB — URINE CULTURE
Colony Count: NO GROWTH
ORGANISM ID, BACTERIA: NO GROWTH

## 2015-08-14 ENCOUNTER — Telehealth: Payer: Self-pay | Admitting: *Deleted

## 2015-08-14 NOTE — Telephone Encounter (Signed)
Pt called requesting urine culture results 08/11/15, I left detailed message on voicemail per DPR no growth.

## 2015-09-08 ENCOUNTER — Ambulatory Visit (INDEPENDENT_AMBULATORY_CARE_PROVIDER_SITE_OTHER): Payer: BLUE CROSS/BLUE SHIELD | Admitting: Women's Health

## 2015-09-08 ENCOUNTER — Encounter: Payer: Self-pay | Admitting: Women's Health

## 2015-09-08 DIAGNOSIS — B3731 Acute candidiasis of vulva and vagina: Secondary | ICD-10-CM

## 2015-09-08 DIAGNOSIS — B373 Candidiasis of vulva and vagina: Secondary | ICD-10-CM | POA: Diagnosis not present

## 2015-09-08 DIAGNOSIS — R350 Frequency of micturition: Secondary | ICD-10-CM | POA: Diagnosis not present

## 2015-09-08 LAB — URINALYSIS W MICROSCOPIC + REFLEX CULTURE
Casts: NONE SEEN [LPF]
Crystals: NONE SEEN [HPF]
LEUKOCYTES UA: NEGATIVE
NITRITE: NEGATIVE
PH: 5 (ref 5.0–8.0)
Specific Gravity, Urine: 1.025 (ref 1.001–1.035)

## 2015-09-08 LAB — WET PREP FOR TRICH, YEAST, CLUE
CLUE CELLS WET PREP: NONE SEEN
Trich, Wet Prep: NONE SEEN

## 2015-09-08 MED ORDER — TERCONAZOLE 0.4 % VA CREA
1.0000 | TOPICAL_CREAM | Freq: Every day | VAGINAL | Status: DC
Start: 2015-09-08 — End: 2015-11-20

## 2015-09-08 NOTE — Progress Notes (Signed)
Patient ID: Jaime Nichols, female   DOB: April 14, 1961, 55 y.o.   MRN: [id number] Presents with complaint of intense vaginal irritation, burning sensation of the skin with urination, vaginal itching. Treated with Terazol 7 for 1 week in February had good relief but symptoms returned within 2 weeks. Has had several UTIs and yeast infections in the past 6 months. Is having difficulty maintaining blood sugars, has had increased hemoglobin A1c's and has follow-up scheduled with primary care. Has had recent positive hematuria, urinary incontinence for years, has appointment with urologist in one week.  Denies pain at the end of the stream of urination, abdominal pain or fever.  Exam: Appears well. Obese. External genitalia extremely erythematous at introitus, labia edematous, wet prep done with a Q-tip, positive for yeast. UA negative leukocytes, +3 glucose, +3 blood, 6-10 WBCs, 10-20 rbc's, moderate bacteria.  Yeast vaginitis Hematuria Type 2 diabetes-poor control  Plan: Terazol 7 one applicator at bedtime internally and rub externally as well for 2 weeks. Prescription, proper use given and reviewed. Instructed to call if no relief of symptoms. Keep scheduled urology appointment. Urine culture pending.

## 2015-09-08 NOTE — Patient Instructions (Signed)

## 2015-09-09 LAB — URINE CULTURE: Colony Count: 30000

## 2015-09-10 ENCOUNTER — Telehealth: Payer: Self-pay | Admitting: *Deleted

## 2015-09-10 ENCOUNTER — Encounter: Payer: Self-pay | Admitting: Women's Health

## 2015-09-10 NOTE — Telephone Encounter (Signed)
Pt called asking if Rx for diflucan was sent to pharmacy from [place] 09/08/15 I called pt back and told her only Terazol 7 one applicator at bedtime internally and rub externally as well for 2 weeks was sent and to use internally and externally.

## 2015-09-29 DIAGNOSIS — N390 Urinary tract infection, site not specified: Secondary | ICD-10-CM | POA: Diagnosis not present

## 2015-09-29 DIAGNOSIS — N281 Cyst of kidney, acquired: Secondary | ICD-10-CM | POA: Diagnosis not present

## 2015-09-29 DIAGNOSIS — R319 Hematuria, unspecified: Secondary | ICD-10-CM | POA: Diagnosis not present

## 2015-09-29 DIAGNOSIS — B379 Candidiasis, unspecified: Secondary | ICD-10-CM | POA: Diagnosis not present

## 2015-09-30 ENCOUNTER — Telehealth: Payer: Self-pay | Admitting: *Deleted

## 2015-09-30 MED ORDER — ESTRADIOL 2 MG VA RING: 2.0000 mg | VAGINAL_RING | VAGINAL | Status: DC

## 2015-09-30 NOTE — Telephone Encounter (Signed)
The Estring would be a good choice for Jaime Nichols, Estring 2 mg per vagina every 3 months has minimal systemic affect but will help vaginal and urinary symptoms hopefully. There is a coupon card for this. The Estring is put in the vagina every 3 months, thrown away and a new one put in then., You can have intercourse with it in. Since minimal systemic effects has much less risk for blood clots and strokes.

## 2015-09-30 NOTE — Telephone Encounter (Signed)
Pt aware, rx sent.

## 2015-09-30 NOTE — Telephone Encounter (Signed)
Pt is established with urologist now , had CT scan done yesterday. Pt said that the urologist mention to her she may want to start on estrogen vaginal cream, due to vaginal atrophy. Pt hadTAH with BSO 2010. If this is option, do want OV to discuss? Please advise

## 2015-10-06 ENCOUNTER — Encounter: Payer: Self-pay | Admitting: *Deleted

## 2015-10-06 ENCOUNTER — Encounter: Payer: BLUE CROSS/BLUE SHIELD | Attending: Family Medicine | Admitting: *Deleted

## 2015-10-06 DIAGNOSIS — E119 Type 2 diabetes mellitus without complications: Secondary | ICD-10-CM | POA: Diagnosis not present

## 2015-10-07 NOTE — Progress Notes (Signed)
Diabetes Self-Management Education  Visit Type: First/Initial  Appt. Start Time: 1545 Appt. End Time: 1700  10/07/2015  Ms. Jaime Nichols, identified by name and date of birth, is a 55 y.o. female with a diagnosis of Diabetes:  .   ASSESSMENT  States she has been diagnosed with diabetes for ~2 years and for "several years" has maintained glucose control through low dose Metformin.  States her A1C rose to ~10% this past December and she's not sure why.         Diabetes Self-Management Education - 10/06/15 1550    Visit Information   Visit Type First/Initial   Complications   Last HgB A1C per patient/outside source 10 %   How often do you check your blood sugar? 1-2 times/day   Fasting Blood glucose range (mg/dL) >200   Number of hypoglycemic episodes per month 0   Have you had a dilated eye exam in the past 12 months? Yes   Have you had a dental exam in the past 12 months? Yes   Are you checking your feet? 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  has "heart issue"   Patient Education   Disease state  Factors that contribute to the development of diabetes;Explored patient's options for treatment of their diabetes   Nutrition management  Other (comment)  MyPlate recommendations   Physical activity and exercise  Role of exercise on diabetes management, blood pressure control and cardiac health.;Helped patient identify appropriate exercises in relation to his/her diabetes, diabetes complications and other health issue.   Medications Other (comment)  referred to endocrinology, as needed, for med adjustment   Psychosocial adjustment Role of stress on diabetes   Personal strategies to promote health Lifestyle issues that need to be addressed for better diabetes care   Individualized Goals (developed by patient)   Nutrition General guidelines for healthy choices and portions discussed   Outcomes   Expected Outcomes Demonstrated interest in learning. Expect positive outcomes   Future DMSE PRN   Program Status Not Completed      Individualized Plan for Diabetes Self-Management Training:   Learning Objective:  Patient will have a greater understanding of diabetes self-management. Patient education plan is to attend individual and/or group sessions per assessed needs and concerns.   Plan:  Follow MyPlate recommendations for meal planning and focus on increasing vegetables and providing adequate carbohydrates Increase physical activity via recumbent bike or water aerobics Consider asking PCP for referral to endocrinology Practice time management with your dog in order to decrease stress  Expected Outcomes:  Demonstrated interest in learning. Expect positive outcomes  Education material provided: My Plate  If problems or questions, patient to contact team via:  Phone  Future DSME appointment: PRN

## 2015-10-07 NOTE — Patient Instructions (Signed)
Follow MyPlate recommendations for meal planning and focus on increasing vegetables and providing adequate carbohydrates Increase physical activity via recumbent bike or water aerobics Consider asking PCP for referral to endocrinology Practice time management with your dog in order to decrease stress

## 2015-10-08 DIAGNOSIS — L720 Epidermal cyst: Secondary | ICD-10-CM | POA: Diagnosis not present

## 2015-10-15 DIAGNOSIS — N390 Urinary tract infection, site not specified: Secondary | ICD-10-CM | POA: Diagnosis not present

## 2015-10-21 DIAGNOSIS — E1165 Type 2 diabetes mellitus with hyperglycemia: Secondary | ICD-10-CM | POA: Diagnosis not present

## 2015-10-28 ENCOUNTER — Encounter: Payer: Self-pay | Admitting: Endocrinology

## 2015-10-28 ENCOUNTER — Ambulatory Visit (INDEPENDENT_AMBULATORY_CARE_PROVIDER_SITE_OTHER): Payer: BLUE CROSS/BLUE SHIELD | Admitting: Endocrinology

## 2015-10-28 VITALS — BP 136/80 | HR 86 | Temp 98.5°F | Ht 63.0 in | Wt 260.0 lb

## 2015-10-28 DIAGNOSIS — I509 Heart failure, unspecified: Secondary | ICD-10-CM

## 2015-10-28 DIAGNOSIS — E669 Obesity, unspecified: Secondary | ICD-10-CM | POA: Diagnosis not present

## 2015-10-28 DIAGNOSIS — E1142 Type 2 diabetes mellitus with diabetic polyneuropathy: Secondary | ICD-10-CM

## 2015-10-28 MED ORDER — SITAGLIPTIN PHOSPHATE 100 MG PO TABS: 100.0000 mg | ORAL_TABLET | Freq: Every day | ORAL | Status: DC

## 2015-10-28 MED ORDER — GLIMEPIRIDE 4 MG PO TABS
4.0000 mg | ORAL_TABLET | Freq: Every day | ORAL | Status: DC
Start: 2015-10-28 — End: 2016-02-16

## 2015-10-28 NOTE — Progress Notes (Signed)
Subjective:    Patient ID: Jaime Nichols, female    DOB: [birth year], 55 y.o.   MRN: [id number]  HPI pt states DM was dx'ed in 2015 (she had GDM in 1992); she has mild if any neuropathy of the lower extremities; she is unaware of any associated chronic complications; she has never been on insulin; pt says her diet and exercise are poor; she has never had pancreatitis, severe hypoglycemia or DKA.  Glycemic control deteriorated from 2016 to 2017.  She says cbg's are in the 300's.  She has chronic yeast vaginitis and UTI's.   Past Medical History  Diagnosis Date  . Hyperlipidemia   . Fibroid   . Diabetes mellitus without complication ([place])   . CHF (congestive heart failure) ([place])   . HTN (hypertension)     Past Surgical History  Procedure Laterality Date  . Abdominal hysterectomy  2010    BSO  . Dilation and curettage of uterus    . Hysteroscopy    . Benign uterine polyps  01/2009    Social History   Social History  . Marital Status: Married    Spouse Name: N/A  . Number of Children: N/A  . Years of Education: N/A   Occupational History  . Not on file.   Social History Main Topics  . Smoking status: Never Smoker   . Smokeless tobacco: Never Used  . Alcohol Use: Yes     Comment: occ.  . Drug Use: No  . Sexual Activity:    Partners: Male    Birth Control/ Protection: Surgical     Comment: hyst   Other Topics Concern  . Not on file   Social History Narrative    Current Outpatient Prescriptions on File Prior to Visit  Medication Sig Dispense Refill  . Cholecalciferol (VITAMIN D3 PO) Take 2,500 Units by mouth daily.    Marland Kitchen CLINPRO 5000 1.1 % PSTE Take 1 application by mouth 2 (two) times daily. Reported on 08/11/2015  10  . estradiol (ESTRING) 2 MG vaginal ring Place 2 mg vaginally every 3 (three) months. follow package directions 1 each 3  . Glucosamine-Chondroit-Vit C-Mn (GLUCOSAMINE CHONDR 1500 COMPLX) CAPS Take 1,500 capsules by mouth daily.    Marland Kitchen  lisinopril-hydrochlorothiazide (PRINZIDE,ZESTORETIC) 10-12.5 MG per tablet Take 1 tablet by mouth.     Marland Kitchen MAGNESIUM PO Take 1 tablet by mouth daily.    . metoprolol succinate (TOPROL-XL) 25 MG 24 hr tablet Take 1 tablet (25 mg total) by mouth daily. 90 tablet 2  . nystatin-triamcinolone ointment (MYCOLOG) Apply 1 application topically 2 (two) times daily. 30 g 0  . rosuvastatin (CRESTOR) 20 MG tablet Take 20 mg by mouth every other day.   1  . terconazole (TERAZOL 7) 0.4 % vaginal cream Place 1 applicator vaginally at bedtime. For 14 nights 90 g 1   No current facility-administered medications on file prior to visit.    Allergies  Allergen Reactions  . Latex Other (See Comments)    REACTION: "CONTACT DERMATITIS"  . Erythromycin     Family History  Problem Relation Age of Onset  . Hypertension Mother   . Heart disease Father   . Diabetes Paternal Grandmother   . Healthy Sister   . Depression Brother   . Healthy Sister     BP 136/80 mmHg  Pulse 86  Temp(Src) 98.5 F (36.9 C) (Oral)  Ht 5\' 3"  (1.6 m)  Wt 260 lb (117.935 kg)  BMI 46.07 kg/m2  SpO2 96%  Review of Systems denies blurry vision, headache, chest pain, sob, n/v, muscle cramps, excessive diaphoresis, depression, cold intolerance, rhinorrhea, and easy bruising.  She has frequent urination and weight loss (15 lbs x 5 months).      Objective:   Physical Exam VS: see vs page GEN: no distress HEAD: head: no deformity eyes: no periorbital swelling, no proptosis external nose and ears are normal mouth: no lesion seen NECK: supple, thyroid is not enlarged CHEST WALL: no deformity LUNGS: clear to auscultation CV: reg rate and rhythm, no murmur ABD: abdomen is soft, nontender.  no hepatosplenomegaly.  not distended.  no hernia MUSCULOSKELETAL: muscle bulk and strength are grossly normal.  no obvious joint swelling.  gait is normal and steady EXTEMITIES: no deformity.  no ulcer on the feet.  feet are of normal color and  temp.  no edema PULSES: dorsalis pedis intact bilat.  no carotid bruit NEURO:  cn 2-12 grossly intact.   readily moves all 4's.  sensation is intact to touch on the feet SKIN:  Normal texture and temperature.  No rash or suspicious lesion is visible.   NODES:  None palpable at the neck PSYCH: alert, well-oriented.  Does not appear anxious nor depressed.    outside test results are reviewed: A1c=11.4%  I have reviewed outside records, and summarized: Pt was noted to have severely elevated a1c, and referred here.  i personally reviewed electrocardiogram tracing (04/30/15): Indication: HTN Impression: anterior QS complexes.    Assessment & Plan:  DM: severe exacerbation.  I told pt she will likely need insulin, but she declines for now.   Morbid obesity, new to me  Patient is advised the following: Patient Instructions  good diet and exercise significantly improve the control of your diabetes.  please let me know if you wish to be referred to a dietician.  high blood sugar is very risky to your health.  you should see an eye doctor and dentist every year.  It is very important to get all recommended vaccinations.  controlling your blood pressure and cholesterol drastically reduces the damage diabetes does to your body.  Those who smoke should quit.  please discuss these with your doctor.  check your blood sugar once a day.  vary the time of day when you check, between before the 3 meals, and at bedtime.  also check if you have symptoms of your blood sugar being too high or too low.  please keep a record of the readings and bring it to your next appointment here (or you can bring the meter itself).  You can write it on any piece of paper.  please call us sooner if your blood sugar goes below 70, or if you have a lot of readings over 200.  Please consider having weight loss surgery.  It is good for your health.  Here is some information about it.  If you decide to consider further, please call  the phone number in the papers, and register for a free informational meeting. Please continue the same metformin, and: I have sent a prescription to your pharmacy, to add 2 more medications.  Drink plenty of fluids.  This helps your blood sugar, also.   Please come back for a follow-up appointment in 2-3 weeks.

## 2015-10-28 NOTE — Patient Instructions (Addendum)
good diet and exercise significantly improve the control of your diabetes.  please let me know if you wish to be referred to a dietician.  high blood sugar is very risky to your health.  you should see an eye doctor and dentist every year.  It is very important to get all recommended vaccinations.  controlling your blood pressure and cholesterol drastically reduces the damage diabetes does to your body.  Those who smoke should quit.  please discuss these with your doctor.  check your blood sugar once a day.  vary the time of day when you check, between before the 3 meals, and at bedtime.  also check if you have symptoms of your blood sugar being too high or too low.  please keep a record of the readings and bring it to your next appointment here (or you can bring the meter itself).  You can write it on any piece of paper.  please call us sooner if your blood sugar goes below 70, or if you have a lot of readings over 200.  Please consider having weight loss surgery.  It is good for your health.  Here is some information about it.  If you decide to consider further, please call the phone number in the papers, and register for a free informational meeting. Please continue the same metformin, and: I have sent a prescription to your pharmacy, to add 2 more medications.  Drink plenty of fluids.  This helps your blood sugar, also.   Please come back for a follow-up appointment in 2-3 weeks.

## 2015-11-20 ENCOUNTER — Encounter: Payer: Self-pay | Admitting: Endocrinology

## 2015-11-20 ENCOUNTER — Ambulatory Visit (INDEPENDENT_AMBULATORY_CARE_PROVIDER_SITE_OTHER): Payer: BLUE CROSS/BLUE SHIELD | Admitting: Endocrinology

## 2015-11-20 VITALS — BP 112/70 | HR 91 | Temp 98.5°F | Ht 63.0 in | Wt 264.0 lb

## 2015-11-20 DIAGNOSIS — E1142 Type 2 diabetes mellitus with diabetic polyneuropathy: Secondary | ICD-10-CM | POA: Diagnosis not present

## 2015-11-20 MED ORDER — BROMOCRIPTINE MESYLATE 2.5 MG PO TABS: ORAL_TABLET | ORAL | Status: DC

## 2015-11-20 NOTE — Patient Instructions (Addendum)
check your blood sugar once a day.  vary the time of day when you check, between before the 3 meals, and at bedtime.  also check if you have symptoms of your blood sugar being too high or too low.  please keep a record of the readings and bring it to your next appointment here (or you can bring the meter itself).  You can write it on any piece of paper.  please call us sooner if your blood sugar goes below 70, or if you have a lot of readings over 200. Please continue to pursue the weight loss surgery.  It is good for your health.  I have sent a prescription to your pharmacy, to add "bromocriptine," to help your blood sugar. It has possible side effects of nausea and dizziness.  These go away with time.  You can avoid these by taking it at bedtime. Please call us next week, to tell us how the blood sugar is.  If necessary, we can add "invokana."  Here is a discount card.  If we add this, we would need to half the lisinopril-HCTZ.  Drink plenty of fluids.  This helps your blood sugar, also.   Please come back for a follow-up appointment in 3 months.

## 2015-11-20 NOTE — Progress Notes (Signed)
Subjective:    Patient ID: Jaime Nichols, female    DOB: 05-09-1961, 55 y.o.   MRN: [id number]  HPI Pt returns for f/u of diabetes mellitus: DM type: 2 Dx'ed: 123456 Complications: none Therapy: 3 oral meds GDM: never DKA: never Severe hypoglycemia: never Pancreatitis: never Other: she has never been on insulin; She has chronic yeast vaginitis and UTI's; she will likely need insulin, but she declines for now  Interval history: she is pursuing weight loss surgery. She tolerate meds well.  no cbg record, but states fasting cbg's (the time of day she checks) are in the mid-100's.   Past Medical History  Diagnosis Date  . Hyperlipidemia   . Fibroid   . Diabetes mellitus without complication ([place])   . CHF (congestive heart failure) ([place])   . HTN (hypertension)     Past Surgical History  Procedure Laterality Date  . Abdominal hysterectomy  2010    BSO  . Dilation and curettage of uterus    . Hysteroscopy    . Benign uterine polyps  01/2009    Social History   Social History  . Marital Status: Married    Spouse Name: N/A  . Number of Children: N/A  . Years of Education: N/A   Occupational History  . Not on file.   Social History Main Topics  . Smoking status: Never Smoker   . Smokeless tobacco: Never Used  . Alcohol Use: Yes     Comment: occ.  . Drug Use: No  . Sexual Activity:    Partners: Male    Birth Control/ Protection: Surgical     Comment: hyst   Other Topics Concern  . Not on file   Social History Narrative    Current Outpatient Prescriptions on File Prior to Visit  Medication Sig Dispense Refill  . Cholecalciferol (VITAMIN D3 PO) Take 2,500 Units by mouth daily.    Marland Kitchen CLINPRO 5000 1.1 % PSTE Take 1 application by mouth 2 (two) times daily. Reported on 08/11/2015  10  . estradiol (ESTRING) 2 MG vaginal ring Place 2 mg vaginally every 3 (three) months. follow package directions 1 each 3  . glimepiride (AMARYL) 4 MG tablet Take 1 tablet (4 mg  total) by mouth daily before breakfast. 30 tablet 3  . Glucosamine-Chondroit-Vit C-Mn (GLUCOSAMINE CHONDR 1500 COMPLX) CAPS Take 1,500 capsules by mouth daily.    Marland Kitchen lisinopril-hydrochlorothiazide (PRINZIDE,ZESTORETIC) 10-12.5 MG per tablet Take 1 tablet by mouth.     Marland Kitchen MAGNESIUM PO Take 1 tablet by mouth daily.    . metFORMIN (GLUMETZA) 1000 MG (MOD) 24 hr tablet Take 1,000 mg by mouth 2 (two) times daily with a meal.    . metoprolol succinate (TOPROL-XL) 25 MG 24 hr tablet Take 1 tablet (25 mg total) by mouth daily. 90 tablet 2  . rosuvastatin (CRESTOR) 20 MG tablet Take 20 mg by mouth every other day.   1  . sitaGLIPtin (JANUVIA) 100 MG tablet Take 1 tablet (100 mg total) by mouth daily. 30 tablet 11  . valACYclovir (VALTREX) 1000 MG tablet Take 1,000 mg by mouth 2 (two) times daily.     No current facility-administered medications on file prior to visit.    Allergies  Allergen Reactions  . Latex Other (See Comments)    REACTION: "CONTACT DERMATITIS"  . Erythromycin    Family History  Problem Relation Age of Onset  . Hypertension Mother   . Heart disease Father   . Diabetes Paternal Grandmother   .  Healthy Sister   . Depression Brother   . Healthy Sister    BP 112/70 mmHg  Pulse 91  Temp(Src) 98.5 F (36.9 C) (Oral)  Ht 5\' 3"  (1.6 m)  Wt 264 lb (119.75 kg)  BMI 46.78 kg/m2  SpO2 98%  Review of Systems She denies hypoglycemia    Objective:   Physical Exam VITAL SIGNS:  See vs page GENERAL: no distress Pulses: dorsalis pedis intact bilat.   MSK: no deformity of the feet CV: trace leg edema Skin:  no ulcer on the feet.  normal color and temp on the feet. Neuro: sensation is intact to touch on the feet.       Assessment & Plan:  Type 2 DM: she needs increased rx.  Patient is advised the following: Patient Instructions  check your blood sugar once a day.  vary the time of day when you check, between before the 3 meals, and at bedtime.  also check if you have  symptoms of your blood sugar being too high or too low.  please keep a record of the readings and bring it to your next appointment here (or you can bring the meter itself).  You can write it on any piece of paper.  please call us sooner if your blood sugar goes below 70, or if you have a lot of readings over 200. Please continue to pursue the weight loss surgery.  It is good for your health.  I have sent a prescription to your pharmacy, to add "bromocriptine," to help your blood sugar. It has possible side effects of nausea and dizziness.  These go away with time.  You can avoid these by taking it at bedtime. Please call us next week, to tell us how the blood sugar is.  If necessary, we can add "invokana."  Here is a discount card.  If we add this, we would need to half the lisinopril-HCTZ.  Drink plenty of fluids.  This helps your blood sugar, also.   Please come back for a follow-up appointment in 3 months.     Renato Shin, MD

## 2015-11-23 ENCOUNTER — Telehealth: Payer: Self-pay | Admitting: Endocrinology

## 2015-11-23 NOTE — Telephone Encounter (Signed)
Pt advised it is ok to stay hold off on the bromocriptine for right now. Pt voiced understanding

## 2015-11-23 NOTE — Telephone Encounter (Signed)
ok 

## 2015-11-23 NOTE — Telephone Encounter (Signed)
Can she put off starting the bromocriptine this am was 120 upon wake up and she wants to give the other med a longer time to start, is it ok for her to hold off for now

## 2015-11-23 NOTE — Telephone Encounter (Signed)
See note below and please advise, Thanks! 

## 2015-11-25 ENCOUNTER — Telehealth: Payer: Self-pay | Admitting: Endocrinology

## 2015-11-25 MED ORDER — METFORMIN HCL ER 500 MG PO TB24: 500.0000 mg | ORAL_TABLET | Freq: Every day | ORAL | Status: DC

## 2015-11-25 NOTE — Addendum Note (Signed)
Addended by: Verlin Grills T on: 11/25/2015 03:16 PM   Modules accepted: Orders

## 2015-11-25 NOTE — Telephone Encounter (Signed)
rx needed for metformin xr 500 mg 2 tabs twice daily called [facility] on [address].

## 2015-11-25 NOTE — Telephone Encounter (Signed)
Rx submitted per pt's request.  

## 2015-11-25 NOTE — Telephone Encounter (Signed)
Please refill prn 

## 2015-11-25 NOTE — Telephone Encounter (Signed)
See note below and please advise if ok to refill. Medication is not on current med list.  

## 2015-11-27 ENCOUNTER — Telehealth: Payer: Self-pay | Admitting: Endocrinology

## 2015-11-27 MED ORDER — METFORMIN HCL ER 500 MG PO TB24: ORAL_TABLET | ORAL | Status: DC

## 2015-11-27 NOTE — Telephone Encounter (Signed)
Patient has questions about her metformin medication it was reduced, she want to know why, please advise

## 2015-11-27 NOTE — Telephone Encounter (Signed)
LM for call back from pt

## 2015-11-27 NOTE — Telephone Encounter (Signed)
Jaime Nichols, Could you call this pt and advise we fixed the rx the first prescription for metformin 500 mg 1 time per day was an error.

## 2015-12-24 ENCOUNTER — Telehealth: Payer: Self-pay | Admitting: Endocrinology

## 2015-12-24 MED ORDER — METFORMIN HCL ER 500 MG PO TB24: ORAL_TABLET | ORAL | Status: DC

## 2015-12-24 NOTE — Telephone Encounter (Signed)
Need a refill of metFORMIN (GLUCOPHAGE XR) 500 MG 24 hr tablet, send to  [id number] [place], [place] (Phone) [phone] (Fax)

## 2015-12-24 NOTE — Telephone Encounter (Signed)
Rx submitted per pt's request.  

## 2016-01-03 ENCOUNTER — Other Ambulatory Visit: Payer: Self-pay | Admitting: Interventional Cardiology

## 2016-01-28 DIAGNOSIS — E782 Mixed hyperlipidemia: Secondary | ICD-10-CM | POA: Diagnosis not present

## 2016-01-28 DIAGNOSIS — I10 Essential (primary) hypertension: Secondary | ICD-10-CM | POA: Diagnosis not present

## 2016-01-28 DIAGNOSIS — E1165 Type 2 diabetes mellitus with hyperglycemia: Secondary | ICD-10-CM | POA: Diagnosis not present

## 2016-01-28 DIAGNOSIS — E559 Vitamin D deficiency, unspecified: Secondary | ICD-10-CM | POA: Diagnosis not present

## 2016-01-29 DIAGNOSIS — E559 Vitamin D deficiency, unspecified: Secondary | ICD-10-CM | POA: Diagnosis not present

## 2016-01-29 DIAGNOSIS — E1165 Type 2 diabetes mellitus with hyperglycemia: Secondary | ICD-10-CM | POA: Diagnosis not present

## 2016-01-29 DIAGNOSIS — E782 Mixed hyperlipidemia: Secondary | ICD-10-CM | POA: Diagnosis not present

## 2016-02-16 ENCOUNTER — Other Ambulatory Visit: Payer: Self-pay | Admitting: Endocrinology

## 2016-02-24 ENCOUNTER — Ambulatory Visit (INDEPENDENT_AMBULATORY_CARE_PROVIDER_SITE_OTHER): Payer: BLUE CROSS/BLUE SHIELD | Admitting: Endocrinology

## 2016-02-24 VITALS — BP 142/86 | HR 89 | Wt 268.0 lb

## 2016-02-24 DIAGNOSIS — E1142 Type 2 diabetes mellitus with diabetic polyneuropathy: Secondary | ICD-10-CM

## 2016-02-24 MED ORDER — REPAGLINIDE 2 MG PO TABS: 2.0000 mg | ORAL_TABLET | Freq: Three times a day (TID) | ORAL | 11 refills | Status: DC

## 2016-02-24 NOTE — Patient Instructions (Addendum)
check your blood sugar once a day.  vary the time of day when you check, between before the 3 meals, and at bedtime.  also check if you have symptoms of your blood sugar being too high or too low.  please keep a record of the readings and bring it to your next appointment here (or you can bring the meter itself).  You can write it on any piece of paper.  please call us sooner if your blood sugar goes below 70, or if you have a lot of readings over 200. Please continue to pursue the weight loss surgery.  It is good for your health.   I have sent a prescription to your pharmacy, to add "repaglinide."  If necessary, we can add "invokana" or bromocriptine.  Please come back for a follow-up appointment in 3 months.

## 2016-02-24 NOTE — Progress Notes (Signed)
Subjective:    Patient ID: Jaime Nichols, female    DOB: 1961-05-05, 55 y.o.   MRN: [id number]  HPI Pt returns for f/u of diabetes mellitus: DM type: 2 Dx'ed: 123456 Complications: none Therapy: 3 oral meds GDM: never DKA: never Severe hypoglycemia: never Pancreatitis: never Other: she has never been on insulin; she has chronic yeast vaginitis and UTI's; she will likely need insulin, but she declines for now.  Interval history: Meter is downloaded today, and the printout is scanned into the record.  It varies from 95-193.  It is in general higher as the day goes on.  She does not take parlodel.  2 mos ago, she had tremor.  She checked cbg, and it was 70.   Past Medical History:  Diagnosis Date  . CHF (congestive heart failure) ([place])   . Diabetes mellitus without complication ([address])   . Fibroid   . HTN (hypertension)   . Hyperlipidemia     Past Surgical History:  Procedure Laterality Date  . ABDOMINAL HYSTERECTOMY  2010   BSO  . Benign uterine polyps  01/2009  . DILATION AND CURETTAGE OF UTERUS    . HYSTEROSCOPY      Social History   Social History  . Marital status: Married    Spouse name: N/A  . Number of children: N/A  . Years of education: N/A   Occupational History  . Not on file.   Social History Main Topics  . Smoking status: Never Smoker  . Smokeless tobacco: Never Used  . Alcohol use Yes     Comment: occ.  . Drug use: No  . Sexual activity: Yes    Partners: Male    Birth control/ protection: Surgical     Comment: hyst   Other Topics Concern  . Not on file   Social History Narrative  . No narrative on file    Current Outpatient Prescriptions on File Prior to Visit  Medication Sig Dispense Refill  . CLINPRO 5000 1.1 % PSTE Take 1 application by mouth 2 (two) times daily. Reported on 08/11/2015  10  . lisinopril-hydrochlorothiazide (PRINZIDE,ZESTORETIC) 10-12.5 MG per tablet Take 1 tablet by mouth.     Marland Kitchen MAGNESIUM PO Take 1 tablet by mouth  daily.    . metFORMIN (GLUCOPHAGE XR) 500 MG 24 hr tablet Take 2 tabs twice daily with meals. 120 tablet 5  . metoprolol succinate (TOPROL-XL) 25 MG 24 hr tablet TAKE 1 TABLET (25 MG TOTAL) BY MOUTH DAILY. 90 tablet 0  . rosuvastatin (CRESTOR) 20 MG tablet Take 20 mg by mouth every other day.   1  . sitaGLIPtin (JANUVIA) 100 MG tablet Take 1 tablet (100 mg total) by mouth daily. 30 tablet 11   No current facility-administered medications on file prior to visit.     Allergies  Allergen Reactions  . Latex Other (See Comments)    REACTION: "CONTACT DERMATITIS"  . Erythromycin     Family History  Problem Relation Age of Onset  . Hypertension Mother   . Heart disease Father   . Diabetes Paternal Grandmother   . Healthy Sister   . Depression Brother   . Healthy Sister     BP (!) 142/86   Pulse 89   Wt 268 lb (121.6 kg)   SpO2 97%   BMI 47.47 kg/m    Review of Systems She denies LOC.     Objective:   Physical Exam VITAL SIGNS:  See vs page GENERAL: no distress Pulses:  dorsalis pedis intact bilat.   MSK: no deformity of the feet CV: trace leg edema Skin:  no ulcer on the feet.  normal color and temp on the feet. Neuro: sensation is intact to touch on the feet.    outside test results are reviewed:  A1c=6.4%    Assessment & Plan:  Type 2 DM: overcontrolled, for this SU-containing regimen

## 2016-02-29 DIAGNOSIS — L738 Other specified follicular disorders: Secondary | ICD-10-CM | POA: Diagnosis not present

## 2016-02-29 DIAGNOSIS — D485 Neoplasm of uncertain behavior of skin: Secondary | ICD-10-CM | POA: Diagnosis not present

## 2016-02-29 DIAGNOSIS — D2262 Melanocytic nevi of left upper limb, including shoulder: Secondary | ICD-10-CM | POA: Diagnosis not present

## 2016-02-29 DIAGNOSIS — D1801 Hemangioma of skin and subcutaneous tissue: Secondary | ICD-10-CM | POA: Diagnosis not present

## 2016-02-29 DIAGNOSIS — D225 Melanocytic nevi of trunk: Secondary | ICD-10-CM | POA: Diagnosis not present

## 2016-03-02 ENCOUNTER — Encounter: Payer: Self-pay | Admitting: *Deleted

## 2016-03-16 ENCOUNTER — Encounter: Payer: Self-pay | Admitting: Interventional Cardiology

## 2016-03-16 ENCOUNTER — Ambulatory Visit (INDEPENDENT_AMBULATORY_CARE_PROVIDER_SITE_OTHER): Payer: BLUE CROSS/BLUE SHIELD | Admitting: Interventional Cardiology

## 2016-03-16 VITALS — BP 114/68 | HR 95 | Ht 62.5 in | Wt 268.6 lb

## 2016-03-16 DIAGNOSIS — I5032 Chronic diastolic (congestive) heart failure: Secondary | ICD-10-CM

## 2016-03-16 DIAGNOSIS — R0602 Shortness of breath: Secondary | ICD-10-CM | POA: Diagnosis not present

## 2016-03-16 DIAGNOSIS — I10 Essential (primary) hypertension: Secondary | ICD-10-CM

## 2016-03-16 DIAGNOSIS — E785 Hyperlipidemia, unspecified: Secondary | ICD-10-CM | POA: Diagnosis not present

## 2016-03-16 NOTE — Progress Notes (Signed)
Cardiology Office Note    Date:  03/16/2016   ID:  Jaime Nichols, DOB 1961/02/20, MRN [id number]  PCP:  Gerrit Heck, MD  Cardiologist: Sinclair Grooms, MD   Chief Complaint  Patient presents with  . Congestive Heart Failure    History of Present Illness:  Jaime Nichols is a 55 y.o. female who presents for morbid obesity, diastolic dysfunction, and hypertension. She still feels well. Beta blocker therapy has been a tremendous improvement for her. She feels that she had more impact early on after starting the beta blocker than she is now. She feels hot. She has some shortness of breath. She is terrified of increasing the dose of the beta blocker.DM 2 with poor control.  She is concerned that she can fill her heart beating faster than normal after she exercises or walks up a flight of stairs. She is concerned that her heart rate then increased to "" 105-110 bpm". He returns to normal after resting. She denies any spontaneous acceleration's and heart rate. She denies orthopnea, PND, and peripheral edema.   Past Medical History:  Diagnosis Date  . CHF (congestive heart failure) ([place])   . Diabetes mellitus without complication ([place])   . Fibroid   . HTN (hypertension)   . Hyperlipidemia     Past Surgical History:  Procedure Laterality Date  . ABDOMINAL HYSTERECTOMY  2010   BSO  . Benign uterine polyps  01/2009  . DILATION AND CURETTAGE OF UTERUS    . HYSTEROSCOPY      Current Medications: Outpatient Medications Prior to Visit  Medication Sig Dispense Refill  . CLINPRO 5000 1.1 % PSTE Take 1 application by mouth 2 (two) times daily. Reported on 08/11/2015  10  . lisinopril-hydrochlorothiazide (PRINZIDE,ZESTORETIC) 10-12.5 MG per tablet Take 1 tablet by mouth.     Marland Kitchen MAGNESIUM PO Take 1 tablet by mouth daily.    . metoprolol succinate (TOPROL-XL) 25 MG 24 hr tablet TAKE 1 TABLET (25 MG TOTAL) BY MOUTH DAILY. 90 tablet 0  . rosuvastatin (CRESTOR) 20 MG  tablet Take 20 mg by mouth every other day.   1  . sitaGLIPtin (JANUVIA) 100 MG tablet Take 1 tablet (100 mg total) by mouth daily. 30 tablet 11  . metFORMIN (GLUCOPHAGE XR) 500 MG 24 hr tablet Take 2 tabs twice daily with meals. (Patient not taking: Reported on 03/16/2016) 120 tablet 5  . repaglinide (PRANDIN) 2 MG tablet Take 1 tablet (2 mg total) by mouth 3 (three) times daily before meals. (Patient not taking: Reported on 03/16/2016) 90 tablet 11   No facility-administered medications prior to visit.      Allergies:   Latex and Erythromycin   Social History   Social History  . Marital status: Married    Spouse name: N/A  . Number of children: N/A  . Years of education: N/A   Social History Main Topics  . Smoking status: Never Smoker  . Smokeless tobacco: Never Used  . Alcohol use Yes     Comment: occ.  . Drug use: No  . Sexual activity: Yes    Partners: Male    Birth control/ protection: Surgical     Comment: hyst   Other Topics Concern  . None   Social History Narrative  . None     Family History:  The patient's family history includes Depression in her brother; Diabetes in her paternal grandmother; Healthy in her sister and sister; Heart disease in her father; Hypertension in  her mother.   ROS:   Please see the history of present illness.    Shortness of breath, orthopnea, and anxiety.  All other systems reviewed and are negative.   PHYSICAL EXAM:   VS:  BP 114/68   Pulse 95   Ht 5' 2.5" (1.588 m)   Wt 268 lb 9.6 oz (121.8 kg)   BMI 48.34 kg/m    GEN: Well nourished, well developed, in no acute distress . Morbidly obese. HEENT: normal  Neck: no JVD, carotid bruits, or masses Cardiac: RRR; no murmurs, rubs, or gallops,no edema  Respiratory:  clear to auscultation bilaterally, normal work of breathing GI: soft, nontender, nondistended, + BS MS: no deformity or atrophy  Skin: warm and dry, no rash Neuro:  Alert and Oriented x 3, Strength and sensation are  intact Psych: euthymic mood, full affect  Wt Readings from Last 3 Encounters:  03/16/16 268 lb 9.6 oz (121.8 kg)  02/24/16 268 lb (121.6 kg)  11/20/15 264 lb (119.7 kg)      Studies/Labs Reviewed:   EKG:  EKG  Not performed.  Recent Labs: No results found for requested labs within last 8760 hours.   Lipid Panel No results found for: CHOL, TRIG, HDL, CHOLHDL, VLDL, LDLCALC, LDLDIRECT  Additional studies/ records that were reviewed today include:  We will review the echocardiogram that was performed in 2015. Jaime attempted to explain diastolic dysfunction and contrasted with true clinical heart failure.    ASSESSMENT:    1. Chronic diastolic heart failure ([place])   2. Essential hypertension   3. Hyperlipidemia   4. DYSPNEA   5. Morbid obesity due to excess calories ([place])      PLAN:  In order of problems listed above:  1. No evidence of volume overload or heart failure this time. She was re-assure that about the "diastolic failure", that was on her echo report. 2. Excellent blood pressure control. 2 g sodium diet and weight loss are encouraged. 3. Followed by her primary care 4. This is not much of an issue although she does admit to very lax commitment / attitude towards exercise. Jaime encouraged her to reengage in a regular routine of aerobic activity.  This is a prolonged office visit due to extended conversation concerning diastolic dysfunction. Greater than 50% of the time spent during this 40 minute visit was spent in counseling.  Medication Adjustments/Labs and Tests Ordered: Current medicines are reviewed at length with the patient today.  Concerns regarding medicines are outlined above.  Medication changes, Labs and Tests ordered today are listed in the Patient Instructions below. There are no Patient Instructions on file for this visit.   Signed, Sinclair Grooms, MD  03/16/2016 11:47 AM    [facility] [place], [place], [place]   [address] Phone: [phone]; Fax: [phone]

## 2016-03-16 NOTE — Patient Instructions (Signed)

## 2016-04-04 ENCOUNTER — Other Ambulatory Visit: Payer: Self-pay | Admitting: Interventional Cardiology

## 2016-04-15 ENCOUNTER — Other Ambulatory Visit: Payer: Self-pay

## 2016-04-15 MED ORDER — GLIMEPIRIDE 4 MG PO TABS: 4.0000 mg | ORAL_TABLET | Freq: Every day | ORAL | 1 refills | Status: DC

## 2016-04-15 MED ORDER — METFORMIN HCL ER 500 MG PO TB24: 1000.0000 mg | ORAL_TABLET | Freq: Two times a day (BID) | ORAL | 2 refills | Status: DC

## 2016-04-19 DIAGNOSIS — M549 Dorsalgia, unspecified: Secondary | ICD-10-CM | POA: Diagnosis not present

## 2016-04-19 DIAGNOSIS — Z23 Encounter for immunization: Secondary | ICD-10-CM | POA: Diagnosis not present

## 2016-04-29 ENCOUNTER — Ambulatory Visit: Payer: BLUE CROSS/BLUE SHIELD | Admitting: Cardiology

## 2016-05-20 DIAGNOSIS — M545 Low back pain: Secondary | ICD-10-CM | POA: Diagnosis not present

## 2016-05-20 DIAGNOSIS — M546 Pain in thoracic spine: Secondary | ICD-10-CM | POA: Diagnosis not present

## 2016-06-01 DIAGNOSIS — M546 Pain in thoracic spine: Secondary | ICD-10-CM | POA: Diagnosis not present

## 2016-06-08 ENCOUNTER — Encounter: Payer: Self-pay | Admitting: *Deleted

## 2016-06-08 DIAGNOSIS — M546 Pain in thoracic spine: Secondary | ICD-10-CM | POA: Diagnosis not present

## 2016-06-15 DIAGNOSIS — M546 Pain in thoracic spine: Secondary | ICD-10-CM | POA: Diagnosis not present

## 2016-06-21 DIAGNOSIS — M546 Pain in thoracic spine: Secondary | ICD-10-CM | POA: Diagnosis not present

## 2016-06-27 ENCOUNTER — Encounter: Payer: Self-pay | Admitting: Cardiology

## 2016-06-28 DIAGNOSIS — M546 Pain in thoracic spine: Secondary | ICD-10-CM | POA: Diagnosis not present

## 2016-06-28 DIAGNOSIS — M545 Low back pain: Secondary | ICD-10-CM | POA: Diagnosis not present

## 2016-06-29 ENCOUNTER — Encounter: Payer: Self-pay | Admitting: Cardiology

## 2016-06-29 ENCOUNTER — Telehealth: Payer: Self-pay

## 2016-06-29 ENCOUNTER — Ambulatory Visit (INDEPENDENT_AMBULATORY_CARE_PROVIDER_SITE_OTHER): Payer: BLUE CROSS/BLUE SHIELD | Admitting: Cardiology

## 2016-06-29 VITALS — BP 124/62 | HR 98 | Ht 62.75 in | Wt 276.1 lb

## 2016-06-29 DIAGNOSIS — G4733 Obstructive sleep apnea (adult) (pediatric): Secondary | ICD-10-CM | POA: Insufficient documentation

## 2016-06-29 DIAGNOSIS — I5032 Chronic diastolic (congestive) heart failure: Secondary | ICD-10-CM | POA: Diagnosis not present

## 2016-06-29 DIAGNOSIS — Z9989 Dependence on other enabling machines and devices: Secondary | ICD-10-CM | POA: Insufficient documentation

## 2016-06-29 DIAGNOSIS — I10 Essential (primary) hypertension: Secondary | ICD-10-CM | POA: Diagnosis not present

## 2016-06-29 NOTE — Telephone Encounter (Signed)
Patient in today for OSA evaluation. She requests to change primary cardiologist from Dr. Tamala Julian to Dr. Radford Pax so she only has to see one doctor yearly.  To Dr. Tamala Julian and Dr. Radford Pax for agreement.

## 2016-06-29 NOTE — Patient Instructions (Signed)

## 2016-06-29 NOTE — Telephone Encounter (Signed)
I am fine with that 

## 2016-06-29 NOTE — Telephone Encounter (Signed)
Cool with me.

## 2016-06-29 NOTE — Progress Notes (Signed)
Cardiology Office Note    Date:  06/29/2016   ID:  Jaime Nichols, DOB September 05, 1960, MRN [id number]  PCP:  Gerrit Heck, MD  Cardiologist:  Fransico Him, MD   Chief Complaint  Patient presents with  . Sleep Apnea  . Hypertension    History of Present Illness:  Jaime Nichols is a 56 y.o. female with a history of OSA, obesity and HTN who presents today for followup. She is doing well. She tolerates her CPAP therapy without problems. She tolerates the nasal pillow mask and feels the pressure is adequate. She feels rested in the am and has no significant daytime sleepiness and does not nap unless she is having problems with her back or is sick. She does not get any aerobic exercise due to chronic back pain.   Past Medical History:  Diagnosis Date  . Chronic diastolic CHF (congestive heart failure) ([place])   . Diabetes mellitus without complication ([place])   . Fibroid   . HTN (hypertension)   . Hyperlipidemia   . OSA on CPAP     Past Surgical History:  Procedure Laterality Date  . ABDOMINAL HYSTERECTOMY  2010   BSO  . Benign uterine polyps  01/2009  . DILATION AND CURETTAGE OF UTERUS    . HYSTEROSCOPY      Current Medications: Outpatient Medications Prior to Visit  Medication Sig Dispense Refill  . CLINPRO 5000 1.1 % PSTE Take 1 application by mouth 2 (two) times daily. Reported on 08/11/2015  10  . glimepiride (AMARYL) 4 MG tablet Take 1 tablet (4 mg total) by mouth daily before breakfast. 90 tablet 1  . lisinopril-hydrochlorothiazide (PRINZIDE,ZESTORETIC) 10-12.5 MG per tablet Take 1 tablet by mouth.     Marland Kitchen MAGNESIUM PO Take 1 tablet by mouth daily.    . metFORMIN (GLUCOPHAGE-XR) 500 MG 24 hr tablet Take 2 tablets (1,000 mg total) by mouth 2 (two) times daily with a meal. 360 tablet 2  . metoprolol succinate (TOPROL-XL) 25 MG 24 hr tablet TAKE 1 TABLET (25 MG TOTAL) BY MOUTH DAILY. 90 tablet 3  . rosuvastatin (CRESTOR) 20 MG tablet Take 20 mg by  mouth every other day.   1  . sitaGLIPtin (JANUVIA) 100 MG tablet Take 1 tablet (100 mg total) by mouth daily. 30 tablet 11   No facility-administered medications prior to visit.      Allergies:   Latex and Erythromycin   Social History   Social History  . Marital status: Married    Spouse name: N/A  . Number of children: N/A  . Years of education: N/A   Social History Main Topics  . Smoking status: Never Smoker  . Smokeless tobacco: Never Used  . Alcohol use Yes     Comment: occ.  . Drug use: No  . Sexual activity: Yes    Partners: Male    Birth control/ protection: Surgical     Comment: hyst   Other Topics Concern  . None   Social History Narrative  . None     Family History:  The patient's family history includes Depression in her brother; Diabetes in her paternal grandmother; Healthy in her sister and sister; Heart disease in her father; Hypertension in her mother.   ROS:   Please see the history of present illness.    ROS All other systems reviewed and are negative.  No flowsheet data found.     PHYSICAL EXAM:   VS:  BP 124/62   Pulse 98  Ht 5' 2.75" (1.594 m)   Wt 276 lb 1.9 oz (125.2 kg)   SpO2 95%   BMI 49.30 kg/m    GEN: Well nourished, well developed, in no acute distress  HEENT: normal  Neck: no JVD, carotid bruits, or masses Cardiac: RRR; no murmurs, rubs, or gallops,no edema.  Intact distal pulses bilaterally.  Respiratory:  clear to auscultation bilaterally, normal work of breathing GI: soft, nontender, nondistended, + BS MS: no deformity or atrophy  Skin: warm and dry, no rash Neuro:  Alert and Oriented x 3, Strength and sensation are intact Psych: euthymic mood, full affect  Wt Readings from Last 3 Encounters:  06/29/16 276 lb 1.9 oz (125.2 kg)  03/16/16 268 lb 9.6 oz (121.8 kg)  02/24/16 268 lb (121.6 kg)      Studies/Labs Reviewed:   EKG:  EKG ist ordered today and showed sinus tachycardia at 104bpm with septal infarct and no  ST changes.  No significant change from 2016.  Recent Labs: No results found for requested labs within last 8760 hours.   Lipid Panel No results found for: CHOL, TRIG, HDL, CHOLHDL, VLDL, LDLCALC, LDLDIRECT  Additional studies/ records that were reviewed today include:  CPAP download    ASSESSMENT:    1. OSA on CPAP   2. Essential hypertension   3. Morbid obesity ([place])   4. Chronic diastolic CHF (congestive heart failure) ([facility])      PLAN:  In order of problems listed above:  OSA - the patient is tolerating PAP therapy well without any problems. The PAP download was reviewed today and showed an AHI of 0.4/hr on 12.6 cm H2O with 100% compliance in using more than 4 hours nightly.  The patient has been using and benefiting from CPAP use and will continue to benefit from therapy.  HTN - BP controlled on current meds.  Continue ACE I/diuretic /BB. Obesity - unfortunately she is limited in her exercise due to chronic back problems. I encouraged her to consider weight loss surgery.  She has discussed this with her endocrinologist. Chronic diastolic CHF - appears euvolemic on exam today.       Medication Adjustments/Labs and Tests Ordered: Current medicines are reviewed at length with the patient today.  Concerns regarding medicines are outlined above.  Medication changes, Labs and Tests ordered today are listed in the Patient Instructions below.  There are no Patient Instructions on file for this visit.   Signed, Fransico Him, MD  06/29/2016 11:20 AM    [address] [place], [place], [place]  [address] Phone: [phone]; Fax: [phone]

## 2016-06-30 NOTE — Addendum Note (Signed)
Addended by: Zebedee Iba on: 06/30/2016 03:11 PM   Modules accepted: Orders

## 2016-07-05 ENCOUNTER — Telehealth: Payer: Self-pay | Admitting: Cardiology

## 2016-07-05 NOTE — Telephone Encounter (Signed)
Patient called to update her pharmacy:  [facility] [phone] [address] [place]

## 2016-07-12 ENCOUNTER — Telehealth: Payer: Self-pay | Admitting: Cardiology

## 2016-07-12 NOTE — Telephone Encounter (Signed)
Patient requests Dr. Radford Pax to take a second look at her EKG from last OV. She reports Dr. Radford Pax told her that her EKG was unchanged from the others, but there is a new finding at the top of the most recent EKG that suggests "possible lateral infarct" and she is concerned. Explained to patient that Dr. Radford Pax reviewed her EKG prior to seeing her and was not concerned with any findings. Per Dr. Theodosia Blender note: "EKG:  EKG ist ordered today and showed sinus tachycardia at 104bpm with septal infarct and no ST changes.  No significant change from 2016."  Patient requests Dr. Radford Pax to take another look at her EKG for more assurance.  To Dr. Radford Pax.    Patient requests Dr. Theodosia Blender notes to be left on her VM if patient does not pick up.

## 2016-07-12 NOTE — Telephone Encounter (Signed)
New Message  Pt call requesting to speak with RN about abnormal EKG. Please call back to discuss

## 2016-07-13 DIAGNOSIS — M8588 Other specified disorders of bone density and structure, other site: Secondary | ICD-10-CM | POA: Diagnosis not present

## 2016-07-13 NOTE — Telephone Encounter (Signed)
Informed patient of Dr. Theodosia Blender notes and she understand there have been no significant changes since at least 2015. She was grateful for call.

## 2016-07-13 NOTE — Telephone Encounter (Signed)
Her EKG shows poor R wave progression that is due to lead placement due to breast tissue.  Her EKG has been the same since at least 2015 at which time an echo showed all walls of her heart to be functioning normally with no evidence of infarct

## 2016-07-14 DIAGNOSIS — M546 Pain in thoracic spine: Secondary | ICD-10-CM | POA: Diagnosis not present

## 2016-07-14 DIAGNOSIS — M545 Low back pain: Secondary | ICD-10-CM | POA: Diagnosis not present

## 2016-07-18 DIAGNOSIS — M2550 Pain in unspecified joint: Secondary | ICD-10-CM | POA: Diagnosis not present

## 2016-07-18 DIAGNOSIS — M150 Primary generalized (osteo)arthritis: Secondary | ICD-10-CM | POA: Diagnosis not present

## 2016-07-18 DIAGNOSIS — M545 Low back pain: Secondary | ICD-10-CM | POA: Diagnosis not present

## 2016-07-19 DIAGNOSIS — M546 Pain in thoracic spine: Secondary | ICD-10-CM | POA: Diagnosis not present

## 2016-07-19 DIAGNOSIS — M545 Low back pain: Secondary | ICD-10-CM | POA: Diagnosis not present

## 2016-08-15 DIAGNOSIS — H5213 Myopia, bilateral: Secondary | ICD-10-CM | POA: Diagnosis not present

## 2016-08-15 DIAGNOSIS — E119 Type 2 diabetes mellitus without complications: Secondary | ICD-10-CM | POA: Diagnosis not present

## 2016-10-10 ENCOUNTER — Other Ambulatory Visit: Payer: Self-pay | Admitting: Endocrinology

## 2016-10-28 ENCOUNTER — Other Ambulatory Visit: Payer: Self-pay | Admitting: Women's Health

## 2016-10-28 DIAGNOSIS — Z1231 Encounter for screening mammogram for malignant neoplasm of breast: Secondary | ICD-10-CM

## 2016-11-01 DIAGNOSIS — E559 Vitamin D deficiency, unspecified: Secondary | ICD-10-CM | POA: Diagnosis not present

## 2016-11-01 DIAGNOSIS — E119 Type 2 diabetes mellitus without complications: Secondary | ICD-10-CM | POA: Diagnosis not present

## 2016-11-01 DIAGNOSIS — I10 Essential (primary) hypertension: Secondary | ICD-10-CM | POA: Diagnosis not present

## 2016-11-01 DIAGNOSIS — E782 Mixed hyperlipidemia: Secondary | ICD-10-CM | POA: Diagnosis not present

## 2016-11-02 DIAGNOSIS — E119 Type 2 diabetes mellitus without complications: Secondary | ICD-10-CM | POA: Diagnosis not present

## 2016-11-02 DIAGNOSIS — E782 Mixed hyperlipidemia: Secondary | ICD-10-CM | POA: Diagnosis not present

## 2016-11-02 DIAGNOSIS — R6889 Other general symptoms and signs: Secondary | ICD-10-CM | POA: Diagnosis not present

## 2016-11-03 ENCOUNTER — Other Ambulatory Visit: Payer: Self-pay | Admitting: Endocrinology

## 2016-11-03 NOTE — Telephone Encounter (Signed)
Please refill x 3 mos Ov is due 

## 2016-11-04 NOTE — Telephone Encounter (Signed)
Patient needs appointment for further refills

## 2016-11-22 ENCOUNTER — Encounter: Payer: Self-pay | Admitting: Women's Health

## 2016-11-22 ENCOUNTER — Ambulatory Visit
Admission: RE | Admit: 2016-11-22 | Discharge: 2016-11-22 | Disposition: A | Discharge status: Home / Self Care | Payer: BLUE CROSS/BLUE SHIELD | Source: Ambulatory Visit | Attending: Women's Health | Admitting: Women's Health

## 2016-11-22 DIAGNOSIS — Z1231 Encounter for screening mammogram for malignant neoplasm of breast: Secondary | ICD-10-CM

## 2016-12-01 DIAGNOSIS — M79644 Pain in right finger(s): Secondary | ICD-10-CM | POA: Diagnosis not present

## 2016-12-23 DIAGNOSIS — S61210A Laceration without foreign body of right index finger without damage to nail, initial encounter: Secondary | ICD-10-CM | POA: Diagnosis not present

## 2017-01-02 ENCOUNTER — Other Ambulatory Visit: Payer: Self-pay | Admitting: Endocrinology

## 2017-01-19 DIAGNOSIS — R350 Frequency of micturition: Secondary | ICD-10-CM | POA: Diagnosis not present

## 2017-02-02 ENCOUNTER — Other Ambulatory Visit: Payer: Self-pay | Admitting: Endocrinology

## 2017-02-07 DIAGNOSIS — E559 Vitamin D deficiency, unspecified: Secondary | ICD-10-CM | POA: Diagnosis not present

## 2017-02-07 DIAGNOSIS — R300 Dysuria: Secondary | ICD-10-CM | POA: Diagnosis not present

## 2017-02-07 DIAGNOSIS — E119 Type 2 diabetes mellitus without complications: Secondary | ICD-10-CM | POA: Diagnosis not present

## 2017-02-07 DIAGNOSIS — E782 Mixed hyperlipidemia: Secondary | ICD-10-CM | POA: Diagnosis not present

## 2017-02-14 DIAGNOSIS — N390 Urinary tract infection, site not specified: Secondary | ICD-10-CM | POA: Diagnosis not present

## 2017-02-22 ENCOUNTER — Other Ambulatory Visit: Payer: Self-pay | Admitting: Endocrinology

## 2017-02-27 DIAGNOSIS — N3946 Mixed incontinence: Secondary | ICD-10-CM | POA: Diagnosis not present

## 2017-02-27 DIAGNOSIS — N390 Urinary tract infection, site not specified: Secondary | ICD-10-CM | POA: Diagnosis not present

## 2017-02-28 ENCOUNTER — Ambulatory Visit (INDEPENDENT_AMBULATORY_CARE_PROVIDER_SITE_OTHER): Payer: BLUE CROSS/BLUE SHIELD | Admitting: Women's Health

## 2017-02-28 ENCOUNTER — Encounter: Payer: Self-pay | Admitting: Women's Health

## 2017-02-28 VITALS — BP 118/66

## 2017-02-28 DIAGNOSIS — L739 Follicular disorder, unspecified: Secondary | ICD-10-CM

## 2017-02-28 NOTE — Progress Notes (Signed)
Presents with complaint of a lump  left upper inner arm for the past few days. Nonpainful, normal mammogram 11/2016. Denies change in breast exam. Denies vaginal discharge, urinary symptoms, abdominal pain or fever. Numerous health problems including diabetes, hypercholesteremia, hypertension, and heart disease. Contemplating weight loss surgery, needs bilateral knee replacements.  Exam: Appears well. Morbid obesity. Breast exam and sitting and lying position without palpable nodules, no visible retractions, one superficial small 2 cm erythematous area on left upper breast nontender, left upper inner arm 2 cm superficial nontender probable nodules/ folliculitis, non-indurated.  Probable folliculitis  Plan: Reviewed importance of keeping dry, avoid skin to skin rubbing, apply Neosporin or topical antibiotic cream,. Reassurance given, keep scheduled annual exam in October, questions answered. Instructed to call if continued problems.

## 2017-02-28 NOTE — Patient Instructions (Signed)
Sleeve Gastrectomy A sleeve gastrectomy is a surgery in which a large portion of the stomach is removed. After the surgery, the stomach is a narrow tube about the size of a banana. The reduced size of the stomach restricts the amount of food that you can eat, which helps you to lose weight. Tell a health care provider about:  Any allergies you have.  All medicines you are taking, including vitamins, herbs, eye drops, creams, and over-the-counter medicines.  Any problems you or family members have had with anesthetic medicines.  Any blood disorders you have.  Any surgeries you have had.  Any medical conditions you have.  Whether you are pregnant or may be pregnant, if this applies. What are the risks? Generally, this is a safe procedure. However, problems may occur, including:  Infection.  Bleeding.  Allergic reactions to medicines.  Damage to other structures or organs.  Blood clots.  Leakage of fluid from the stomach into the abdominal cavity. (This is rare.)  What happens before the procedure?  Ask your health care provider about: ? Changing or stopping your regular medicines. This is especially important if you are taking diabetes medicines or blood thinners. ? Taking medicines such as aspirin and ibuprofen. These medicines can thin your blood. Do not take these medicines before your procedure if your health care provider instructs you not to.  You may have tests, including: ? Blood tests. ? Urine tests. ? Stool tests. ? X-rays. ? Ultrasound. ? Esophageal manometry. This checks the tube that carries food and liquids from your mouth to your stomach (esophagus).  Follow instructions from your health care provider about eating or drinking restrictions.  Plan to have someone take you home after the procedure.  If you will be going home right after the procedure, plan to have someone with you for 24 hours.  Ask your health care provider how your surgical site will be  marked or identified.  You may be given antibiotic medicine to help prevent infection. What happens during the procedure?  To reduce your risk of infection: ? Your health care team will wash or sanitize their hands. ? Your skin will be washed with soap.  An IV tube will be inserted into one of your veins.  You will be given a medicine to make you fall asleep (general anesthetic). You may also be given a medicine to help you relax (sedative).  Several small incisions will be made in your abdomen.  A thin, lighted tube with a tiny camera on the end (laparoscope) will be inserted into one of the incisions. The camera sends a picture to a TV screen in the operating room. This gives the surgeon a good view of the stomach.  Small surgical instruments will be put through the other incisions.  Part of your stomach will be cut and removed through one of the incisions.  The remaining part of your stomach will be closed using stitches (sutures).  A small tube (drain) may be placed through one of the incisions to allow extra fluid to flow from the area.  Your incisions may be closed with sutures, staples, or skin glue.  Your incisions may be covered with a bandage (dressing). The procedure may vary among health care providers and hospitals. What happens after the procedure?  You will continue to receive fluids and medicines through an IV tube.  You may have some pain and nausea. Medicines will be available to help you.  You may have fluid leaking from a  drain in one of your incisions.  You may have to wear compression stockings. These stockings help to prevent blood clots and reduce swelling in your legs.  You will be encouraged to walk around several times a day. This helps to prevent blood clots.  You will be started on a liquid diet the first day after your procedure. You may have some tests to check if you are ready to start the liquid diet.  You will be encouraged to cough and to  perform deep-breathing exercises. This helps to prevent a lung infection.  Do not drive for 24 hours if you received a sedative. This information is not intended to replace advice given to you by your health care provider. Make sure you discuss any questions you have with your health care provider. Document Released: 04/03/2009 Document Revised: 02/29/2016 Document Reviewed: 11/28/2014 Elsevier Interactive Patient Education  [employer].

## 2017-03-15 DIAGNOSIS — E782 Mixed hyperlipidemia: Secondary | ICD-10-CM | POA: Diagnosis not present

## 2017-03-15 DIAGNOSIS — E559 Vitamin D deficiency, unspecified: Secondary | ICD-10-CM | POA: Diagnosis not present

## 2017-03-15 DIAGNOSIS — E119 Type 2 diabetes mellitus without complications: Secondary | ICD-10-CM | POA: Diagnosis not present

## 2017-04-02 ENCOUNTER — Other Ambulatory Visit: Payer: Self-pay | Admitting: Endocrinology

## 2017-04-02 ENCOUNTER — Other Ambulatory Visit: Payer: Self-pay | Admitting: Interventional Cardiology

## 2017-04-02 NOTE — Telephone Encounter (Signed)
Please refill x 1 Ov is due  

## 2017-04-03 ENCOUNTER — Ambulatory Visit (INDEPENDENT_AMBULATORY_CARE_PROVIDER_SITE_OTHER): Payer: BLUE CROSS/BLUE SHIELD | Admitting: Women's Health

## 2017-04-03 ENCOUNTER — Encounter: Payer: Self-pay | Admitting: Women's Health

## 2017-04-03 ENCOUNTER — Other Ambulatory Visit: Payer: Self-pay

## 2017-04-03 VITALS — BP 126/80 | Ht 62.0 in | Wt 277.0 lb

## 2017-04-03 DIAGNOSIS — B373 Candidiasis of vulva and vagina: Secondary | ICD-10-CM | POA: Diagnosis not present

## 2017-04-03 DIAGNOSIS — Z01419 Encounter for gynecological examination (general) (routine) without abnormal findings: Secondary | ICD-10-CM

## 2017-04-03 DIAGNOSIS — B3731 Acute candidiasis of vulva and vagina: Secondary | ICD-10-CM

## 2017-04-03 MED ORDER — GLIMEPIRIDE 4 MG PO TABS: ORAL_TABLET | ORAL | 0 refills | Status: DC

## 2017-04-03 MED ORDER — TERCONAZOLE 0.4 % VA CREA: 1.0000 | TOPICAL_CREAM | Freq: Every day | VAGINAL | 1 refills | Status: DC

## 2017-04-03 NOTE — Progress Notes (Signed)
Jaime Nichols [birth year] [id number]    History:    Presents for annual exam.  2010 TAH with BSO for endometrial polyps done at [facility]. Hypertension, DM, hypercholesteremia, osteopenia, managed by PC. Neg colonoscopy at 56.   Past medical history, past surgical history, family history and social history were all reviewed and documented in the EPIC chart. Homemaker, 2 daughters both doing well. Mother hypertension, father heart disease.   ROS was performed and pertinent positives and negatives are included.  Exam:  Vitals:   04/03/17 1413  BP: 126/80  Weight: 277 lb (125.6 kg)  Height: 5\' 2"  (1.575 m)   Body mass index is 50.66 kg/m.   General appearance:  Normal Thyroid:  Symmetrical, normal in size, without palpable masses or nodularity. Respiratory  Auscultation:  Clear without wheezing or rhonchi Cardiovascular  Auscultation:  Regular rate, without rubs, murmurs or gallops  Edema/varicosities:  Not grossly evident Abdominal  Soft,nontender, without masses, guarding or rebound.  Liver/spleen:  No organomegaly noted  Hernia:  None appreciated  Skin  Inspection:  Grossly normal   Breasts: Examined lying and sitting.     Right: Without masses, retractions, discharge or axillary adenopathy.     Left: Without masses, retractions, discharge or axillary adenopathy. Gentitourinary   Inguinal/mons:  Normal without inguinal adenopathy  External genitalia:  Normal  BUS/Urethra/Skene's glands:  Normal  Vagina:  Normal  Cervix:  And uterus abent  Adnexa/parametria:     Rt: Without masses or tenderness.   Lt: Without masses or tenderness.  Anus and perineum: Normal  Digital rectal exam: Normal sphincter tone without palpated masses or tenderness  Assessment/Plan:  56 y.o. M WF G2 P2 for annual exam with no complaints.  2010 TAH with BSO for endometrial polyps on no HRT Hypertension, diabetes, hypercholesterolemia -  primary care manages labs and meds Chronic knee  pain Morbid obesity  Plan: Refill of Terazol 7 given per request, no symptoms today. SBE's, continue annual screening mammogram, calcium rich diet, vitamin D 2000 daily encouraged.Reviewed importance of weight loss, briefly reviewed weight loss surgery. Normal Pap history, screening guidelines reviewed.   Victoria Vera, 3:44 PM 04/03/2017

## 2017-04-03 NOTE — Patient Instructions (Addendum)
Health Maintenance for Postmenopausal Women Menopause is a normal process in which your reproductive ability comes to an end. This process happens gradually over a span of months to years, usually between the ages of 22 and 9. Menopause is complete when you have missed 12 consecutive menstrual periods. It is important to talk with your health care provider about some of the most common conditions that affect postmenopausal women, such as heart disease, cancer, and bone loss (osteoporosis). Adopting a healthy lifestyle and getting preventive care can help to promote your health and wellness. Those actions can also lower your chances of developing some of these common conditions. What should I know about menopause? During menopause, you may experience a number of symptoms, such as:  Moderate-to-severe hot flashes.  Night sweats.  Decrease in sex drive.  Mood swings.  Headaches.  Tiredness.  Irritability.  Memory problems.  Insomnia.  Choosing to treat or not to treat menopausal changes is an individual decision that you make with your health care provider. What should I know about hormone replacement therapy and supplements? Hormone therapy products are effective for treating symptoms that are associated with menopause, such as hot flashes and night sweats. Hormone replacement carries certain risks, especially as you become older. If you are thinking about using estrogen or estrogen with progestin treatments, discuss the benefits and risks with your health care provider. What should I know about heart disease and stroke? Heart disease, heart attack, and stroke become more likely as you age. This may be due, in part, to the hormonal changes that your body experiences during menopause. These can affect how your body processes dietary fats, triglycerides, and cholesterol. Heart attack and stroke are both medical emergencies. There are many things that you can do to help prevent heart disease  and stroke:  Have your blood pressure checked at least every 1-2 years. High blood pressure causes heart disease and increases the risk of stroke.  If you are 53-22 years old, ask your health care provider if you should take aspirin to prevent a heart attack or a stroke.  Do not use any tobacco products, including cigarettes, chewing tobacco, or electronic cigarettes. If you need help quitting, ask your health care provider.  It is important to eat a healthy diet and maintain a healthy weight. ? Be sure to include plenty of vegetables, fruits, low-fat dairy products, and lean protein. ? Avoid eating foods that are high in solid fats, added sugars, or salt (sodium).  Get regular exercise. This is one of the most important things that you can do for your health. ? Try to exercise for at least 150 minutes each week. The type of exercise that you do should increase your heart rate and make you sweat. This is known as moderate-intensity exercise. ? Try to do strengthening exercises at least twice each week. Do these in addition to the moderate-intensity exercise.  Know your numbers.Ask your health care provider to check your cholesterol and your blood glucose. Continue to have your blood tested as directed by your health care provider.  What should I know about cancer screening? There are several types of cancer. Take the following steps to reduce your risk and to catch any cancer development as early as possible. Breast Cancer  Practice breast self-awareness. ? This means understanding how your breasts normally appear and feel. ? It also means doing regular breast self-exams. Let your health care provider know about any changes, no matter how small.  If you are 40  or older, have a clinician do a breast exam (clinical breast exam or CBE) every year. Depending on your age, family history, and medical history, it may be recommended that you also have a yearly breast X-ray (mammogram).  If you  have a family history of breast cancer, talk with your health care provider about genetic screening.  If you are at high risk for breast cancer, talk with your health care provider about having an MRI and a mammogram every year.  Breast cancer (BRCA) gene test is recommended for women who have family members with BRCA-related cancers. Results of the assessment will determine the need for genetic counseling and BRCA1 and for BRCA2 testing. BRCA-related cancers include these types: ? Breast. This occurs in males or females. ? Ovarian. ? Tubal. This may also be called fallopian tube cancer. ? Cancer of the abdominal or pelvic lining (peritoneal cancer). ? Prostate. ? Pancreatic.  Cervical, Uterine, and Ovarian Cancer Your health care provider may recommend that you be screened regularly for cancer of the pelvic organs. These include your ovaries, uterus, and vagina. This screening involves a pelvic exam, which includes checking for microscopic changes to the surface of your cervix (Pap test).  For women ages 21-65, health care providers may recommend a pelvic exam and a Pap test every three years. For women ages 79-65, they may recommend the Pap test and pelvic exam, combined with testing for human papilloma virus (HPV), every five years. Some types of HPV increase your risk of cervical cancer. Testing for HPV may also be done on women of any age who have unclear Pap test results.  Other health care providers may not recommend any screening for nonpregnant women who are considered low risk for pelvic cancer and have no symptoms. Ask your health care provider if a screening pelvic exam is right for you.  If you have had past treatment for cervical cancer or a condition that could lead to cancer, you need Pap tests and screening for cancer for at least 20 years after your treatment. If Pap tests have been discontinued for you, your risk factors (such as having a new sexual partner) need to be  reassessed to determine if you should start having screenings again. Some women have medical problems that increase the chance of getting cervical cancer. In these cases, your health care provider may recommend that you have screening and Pap tests more often.  If you have a family history of uterine cancer or ovarian cancer, talk with your health care provider about genetic screening.  If you have vaginal bleeding after reaching menopause, tell your health care provider.  There are currently no reliable tests available to screen for ovarian cancer.  Lung Cancer Lung cancer screening is recommended for adults 69-62 years old who are at high risk for lung cancer because of a history of smoking. A yearly low-dose CT scan of the lungs is recommended if you:  Currently smoke.  Have a history of at least 30 pack-years of smoking and you currently smoke or have quit within the past 15 years. A pack-year is smoking an average of one pack of cigarettes per day for one year.  Yearly screening should:  Continue until it has been 15 years since you quit.  Stop if you develop a health problem that would prevent you from having lung cancer treatment.  Colorectal Cancer  This type of cancer can be detected and can often be prevented.  Routine colorectal cancer screening usually begins at  age 42 and continues through age 45.  If you have risk factors for colon cancer, your health care provider may recommend that you be screened at an earlier age.  If you have a family history of colorectal cancer, talk with your health care provider about genetic screening.  Your health care provider may also recommend using home test kits to check for hidden blood in your stool.  A small camera at the end of a tube can be used to examine your colon directly (sigmoidoscopy or colonoscopy). This is done to check for the earliest forms of colorectal cancer.  Direct examination of the colon should be repeated every  5-10 years until age 71. However, if early forms of precancerous polyps or small growths are found or if you have a family history or genetic risk for colorectal cancer, you may need to be screened more often.  Skin Cancer  Check your skin from head to toe regularly.  Monitor any moles. Be sure to tell your health care provider: ? About any new moles or changes in moles, especially if there is a change in a mole's shape or color. ? If you have a mole that is larger than the size of a pencil eraser.  If any of your family members has a history of skin cancer, especially at a young age, talk with your health care provider about genetic screening.  Always use sunscreen. Apply sunscreen liberally and repeatedly throughout the day.  Whenever you are outside, protect yourself by wearing long sleeves, pants, a wide-brimmed hat, and sunglasses.  What should I know about osteoporosis? Osteoporosis is a condition in which bone destruction happens more quickly than new bone creation. After menopause, you may be at an increased risk for osteoporosis. To help prevent osteoporosis or the bone fractures that can happen because of osteoporosis, the following is recommended:  If you are 46-71 years old, get at least 1,000 mg of calcium and at least 600 mg of vitamin D per day.  If you are older than age 55 but younger than age 65, get at least 1,200 mg of calcium and at least 600 mg of vitamin D per day.  If you are older than age 54, get at least 1,200 mg of calcium and at least 800 mg of vitamin D per day.  Smoking and excessive alcohol intake increase the risk of osteoporosis. Eat foods that are rich in calcium and vitamin D, and do weight-bearing exercises several times each week as directed by your health care provider. What should I know about how menopause affects my mental health? Depression may occur at any age, but it is more common as you become older. Common symptoms of depression  include:  Low or sad mood.  Changes in sleep patterns.  Changes in appetite or eating patterns.  Feeling an overall lack of motivation or enjoyment of activities that you previously enjoyed.  Frequent crying spells.  Talk with your health care provider if you think that you are experiencing depression. What should I know about immunizations? It is important that you get and maintain your immunizations. These include:  Tetanus, diphtheria, and pertussis (Tdap) booster vaccine.  Influenza every year before the flu season begins.  Pneumonia vaccine.  Shingles vaccine.  Your health care provider may also recommend other immunizations. This information is not intended to replace advice given to you by your health care provider. Make sure you discuss any questions you have with your health care provider. Document Released: 07/29/2005  Document Revised: 12/25/2015 Document Reviewed: 03/10/2015 Elsevier Interactive Patient Education  2018 [employer].  Carbohydrate Counting for Diabetes Mellitus, Adult Carbohydrate counting is a method for keeping track of how many carbohydrates you eat. Eating carbohydrates naturally increases the amount of sugar (glucose) in the blood. Counting how many carbohydrates you eat helps keep your blood glucose within normal limits, which helps you manage your diabetes (diabetes mellitus). It is important to know how many carbohydrates you can safely have in each meal. This is different for every person. A diet and nutrition specialist (registered dietitian) can help you make a meal plan and calculate how many carbohydrates you should have at each meal and snack. Carbohydrates are found in the following foods:  Grains, such as breads and cereals.  Dried beans and soy products.  Starchy vegetables, such as potatoes, peas, and corn.  Fruit and fruit juices.  Milk and yogurt.  Sweets and snack foods, such as cake, cookies, candy, chips, and soft  drinks.  How do I count carbohydrates? There are two ways to count carbohydrates in food. You can use either of the methods or a combination of both. Reading "Nutrition Facts" on packaged food The "Nutrition Facts" list is included on the labels of almost all packaged foods and beverages in the [place] It includes:  The serving size.  Information about nutrients in each serving, including the grams (g) of carbohydrate per serving.  To use the "Nutrition Facts":  Decide how many servings you will have.  Multiply the number of servings by the number of carbohydrates per serving.  The resulting number is the total amount of carbohydrates that you will be having.  Learning standard serving sizes of other foods When you eat foods containing carbohydrates that are not packaged or do not include "Nutrition Facts" on the label, you need to measure the servings in order to count the amount of carbohydrates:  Measure the foods that you will eat with a food scale or measuring cup, if needed.  Decide how many standard-size servings you will eat.  Multiply the number of servings by 15. Most carbohydrate-rich foods have about 15 g of carbohydrates per serving. ? For example, if you eat 8 oz (170 g) of strawberries, you will have eaten 2 servings and 30 g of carbohydrates (2 servings x 15 g = 30 g).  For foods that have more than one food mixed, such as soups and casseroles, you must count the carbohydrates in each food that is included.  The following list contains standard serving sizes of common carbohydrate-rich foods. Each of these servings has about 15 g of carbohydrates:   hamburger bun or  English muffin.   oz (15 mL) syrup.   oz (14 g) jelly.  1 slice of bread.  1 six-inch tortilla.  3 oz (85 g) cooked rice or pasta.  4 oz (113 g) cooked dried beans.  4 oz (113 g) starchy vegetable, such as peas, corn, or potatoes.  4 oz (113 g) hot cereal.  4 oz (113 g) mashed potatoes  or  of a large baked potato.  4 oz (113 g) canned or frozen fruit.  4 oz (120 mL) fruit juice.  4-6 crackers.  6 chicken nuggets.  6 oz (170 g) unsweetened dry cereal.  6 oz (170 g) plain fat-free yogurt or yogurt sweetened with artificial sweeteners.  8 oz (240 mL) milk.  8 oz (170 g) fresh fruit or one small piece of fruit.  24 oz (680 g)  popcorn.  Example of carbohydrate counting Sample meal  3 oz (85 g) chicken breast.  6 oz (170 g) brown rice.  4 oz (113 g) corn.  8 oz (240 mL) milk.  8 oz (170 g) strawberries with sugar-free whipped topping. Carbohydrate calculation 1. Identify the foods that contain carbohydrates: ? Rice. ? Corn. ? Milk. ? Strawberries. 2. Calculate how many servings you have of each food: ? 2 servings rice. ? 1 serving corn. ? 1 serving milk. ? 1 serving strawberries. 3. Multiply each number of servings by 15 g: ? 2 servings rice x 15 g = 30 g. ? 1 serving corn x 15 g = 15 g. ? 1 serving milk x 15 g = 15 g. ? 1 serving strawberries x 15 g = 15 g. 4. Add together all of the amounts to find the total grams of carbohydrates eaten: ? 30 g + 15 g + 15 g + 15 g = 75 g of carbohydrates total. This information is not intended to replace advice given to you by your health care provider. Make sure you discuss any questions you have with your health care provider. Document Released: 06/06/2005 Document Revised: 12/25/2015 Document Reviewed: 11/18/2015 Elsevier Interactive Patient Education  2018 [employer]  

## 2017-04-30 ENCOUNTER — Other Ambulatory Visit: Payer: Self-pay | Admitting: Interventional Cardiology

## 2017-05-04 ENCOUNTER — Other Ambulatory Visit: Payer: Self-pay | Admitting: Endocrinology

## 2017-05-08 DIAGNOSIS — L738 Other specified follicular disorders: Secondary | ICD-10-CM | POA: Diagnosis not present

## 2017-05-08 DIAGNOSIS — B353 Tinea pedis: Secondary | ICD-10-CM | POA: Diagnosis not present

## 2017-05-08 DIAGNOSIS — L858 Other specified epidermal thickening: Secondary | ICD-10-CM | POA: Diagnosis not present

## 2017-05-08 DIAGNOSIS — L918 Other hypertrophic disorders of the skin: Secondary | ICD-10-CM | POA: Diagnosis not present

## 2017-05-22 ENCOUNTER — Telehealth: Payer: Self-pay | Admitting: Cardiology

## 2017-05-22 NOTE — Telephone Encounter (Signed)
Patient c/o SOB, and heart palpitations for the past week. BP 109/40 HR 103 when she felt those symptoms. Patient stated symptoms have improved but she still feels her heart racing at times and is winded with walking around the house. She also stated she was eating canned soup for a few days and "my fingers feel tight" Patient stated she weighed herself 2 days ago and had 3lb weight gain. Patient denies chest pain and chest pressure. Informed patient I would send to Dr. Radford Pax for further recommendations.  Patient has an upcoming appt on 07/17/17. Patient verbalized understanding and thanked me for the call.

## 2017-05-22 NOTE — Telephone Encounter (Signed)
New message  Patient calling with concerns of heart racing.  Patient c/o Palpitations:  High priority if patient c/o lightheadedness, shortness of breath, or chest pain  1) How long have you had palpitations/irregular HR/ Afib? Are you having the symptoms now?  Heart racing  2) Are you currently experiencing lightheadedness, SOB or CP? SOB all last week  3) Do you have a history of afib (atrial fibrillation) or irregular heart rhythm? NO  4) Have you checked your BP or HR? (document readings if available): 109/40 HR 103  5) Are you experiencing any other symptoms? Fatigue, SOB, heart racing

## 2017-05-23 NOTE — Telephone Encounter (Signed)
Patient is scheduled with Melina Copa, PA on 05/26/17 at 10am. Patient confirmed appointment and thanked me for the call.

## 2017-05-23 NOTE — Telephone Encounter (Signed)
[place] please have patient come in to see the extender

## 2017-05-25 ENCOUNTER — Encounter: Payer: Self-pay | Admitting: Physician Assistant

## 2017-05-25 NOTE — Progress Notes (Signed)
Cardiology Office Note    Date:  05/26/2017  ID:  Makia Bossi Prats, DOB Dec 04, 1960, MRN [id number] PCP:  Leighton Ruff, MD  Cardiologist:  Dr. Radford Pax   Chief Complaint: palpitations, SOB  History of Present Illness:  Karyssa Amaral Strange is a 56 y.o. female with history of OSA, morbid obesity, HTN, chronic diastolic CHF, DM who presents for evaluation of palpitations and dyspnea.   Last labs were in 10/2015 showing glucose 264, Cr 0.74, Na 140, K 4.6, CO2 33, albumin 4.4, Tbili 0.5, ALP 88, AST 18, ALT 29, A1C 11.4; in 2015 she had Hgb 13. 2D echo 12/2013 showed EF 55-60%, grade 1 DD, cannot exclude RWMA, mildly dilated LA.  Ms Brill describes 4-6 hrs of palpitations on Saturday 05/20/17. She states that during this episode she could not do any activities and could only sit still. She felt her heart racing in her throat. During this time, she was short of breath and had an overall feeling of unwell. She also felt that if she stood and walked that she might pass out. During this time, her BP was lower than her usual (eg. 113/41) and her HR per her BP monitor was in the low 100s. The palpitations subsided after 4-6 hrs; however, she generally felt unwell all weekend. She wonders if this was perhaps related to the Zuppa toscana soup she made with her husband which was extremely salty. The palpitations have not recurred this week. She reports chronic mild DOE (not out of proportion to baseline level of activity). She knows she needs to lose weight. She denies chest pain. She states that her toprol helped her previous self-reported tachycardia, but that it might be time to increase the dose as she states she frequently feels like her heart rate is increased above her baseline in the 80-90s.    Past Medical History:  Diagnosis Date  . Chronic diastolic CHF (congestive heart failure) ([place])   . Diabetes mellitus type 2 in obese ([place])   . Fibroid   . HTN (hypertension)   . Hyperlipidemia    . OSA on CPAP     Past Surgical History:  Procedure Laterality Date  . ABDOMINAL HYSTERECTOMY  2010   BSO  . Benign uterine polyps  01/2009  . DILATION AND CURETTAGE OF UTERUS    . HYSTEROSCOPY      Current Medications: Current Meds  Medication Sig  . CALCIUM CITRATE PO Take 600 mg by mouth daily.  Marland Kitchen CLINPRO 5000 1.1 % PSTE Take 1 application by mouth 2 (two) times daily. Reported on 08/11/2015  . glimepiride (AMARYL) 4 MG tablet TAKE 1 TABLET BY MOUTH EVERY DAY BEFORE BREAKFAST  . JANUVIA 100 MG tablet TAKE 1 TABLET BY MOUTH EVERY DAY  . lisinopril-hydrochlorothiazide (PRINZIDE,ZESTORETIC) 10-12.5 MG per tablet Take 1 tablet by mouth.   Marland Kitchen MAGNESIUM PO Take 1 tablet by mouth daily.  . metFORMIN (GLUCOPHAGE-XR) 500 MG 24 hr tablet TAKE 2 TABLETS BY MOUTH TWICE A DAY WITH MEALS  . metoprolol succinate (TOPROL-XL) 25 MG 24 hr tablet Take one (1) tablet (25 mg) by mouth daily. Please call to make appointment 2nd attempt thanks.  . rosuvastatin (CRESTOR) 20 MG tablet Take 20 mg by mouth every other day.   . [DISCONTINUED] terconazole (TERAZOL 7) 0.4 % vaginal cream Place 1 applicator vaginally at bedtime.  . [DISCONTINUED] tiZANidine (ZANAFLEX) 2 MG tablet Take 1 tablet by mouth as directed.     Allergies:   Latex; Erythromycin; and Statins  Social History   Socioeconomic History  . Marital status: Married    Spouse name: None  . Number of children: None  . Years of education: None  . Highest education level: None  Social Needs  . Financial resource strain: None  . Food insecurity - worry: None  . Food insecurity - inability: None  . Transportation needs - medical: None  . Transportation needs - non-medical: None  Occupational History  . None  Tobacco Use  . Smoking status: Never Smoker  . Smokeless tobacco: Never Used  Substance and Sexual Activity  . Alcohol use: Yes    Comment: occ.  . Drug use: No  . Sexual activity: Yes    Partners: Male    Birth  control/protection: Surgical    Comment: hyst  Other Topics Concern  . None  Social History Narrative  . None     Family History:  Family History  Problem Relation Age of Onset  . Hypertension Mother   . Heart disease Father   . Healthy Sister   . Depression Brother   . Healthy Sister   . Diabetes Paternal Grandmother     ROS:   Please see the history of present illness.All other systems are reviewed and otherwise negative.    PHYSICAL EXAM:   VS:  BP 140/70   Pulse 82   Resp 16   Ht 5\' 3"  (1.6 m)   Wt 276 lb 12.8 oz (125.6 kg)   SpO2 98%   BMI 49.03 kg/m   BMI: Body mass index is 49.03 kg/m. GEN: Well nourished, well developed morbidlyobese WF, in no acute distress  HEENT: normocephalic, atraumatic Neck: no JVD, carotid bruits, or masses Cardiac: RRR; no murmurs, rubs, or gallops, no edema  Respiratory:  clear to auscultation bilaterally, normal work of breathing GI: soft, nontender, nondistended, + BS MS: no deformity or atrophy  Skin: warm and dry, no rash Neuro:  Alert and Oriented x 3, Strength and sensation are intact, follows commands Psych: euthymic mood, full affect  Wt Readings from Last 3 Encounters:  05/26/17 276 lb 12.8 oz (125.6 kg)  04/03/17 277 lb (125.6 kg)  06/29/16 276 lb 1.9 oz (125.2 kg)      Studies/Labs Reviewed:   EKG:  EKG was ordered today and personally reviewed by me and demonstrates NSR 90bpm, once PVC, poor R wave progression  Recent Labs: No results found for requested labs within last 8760 hours.   Lipid Panel No results found for: CHOL, TRIG, HDL, CHOLHDL, VLDL, LDLCALC, LDLDIRECT  Additional studies/ records that were reviewed today include: Summarized above.    ASSESSMENT & PLAN:    1. Palpitations - pt experienced 4-6 hrs of palpitations associated with lower than usual blood pressure, feeling unwell, shortness of breath, and feelings of pre-syncope. These palpitations subsided, but given her obesity and OSA, she  would be at high risk for something like atrial fib/flutter. Will update labs including BMET, CBC, Mg, TSH. I also discussed wearing a 30-day event monitor to capture a recurrence of her palpitations to rule out atrial arrhythmia. She is reluctant but agreeable. Will also update 2D echo as this information will help guide what med regimen to titrate if needed. Will tentatively increase Toprol to 1.5 tablets daily. 2. Dyspnea - this was associated with her 4-6 hr bout of palpitations. She states she is at her baseline SOB given her weight and overall cardiac health. F/u BNP with labs. Pulse ox during this visit was 98% on  room air.  3. Chronic diastolic CHF - Last echo in 2015 with Grade 1 DD. Plan to repeat echo pending results of her event monitor. If changed from previous, may consider ischemic evaluation. She appears euvolemic on exam. Discussed importance of long term weight loss. 4. Essential HTN - She states her BP was depressed compared to her usual BP during palpitations. Her blood pressure is upper-normal today. She states she normally runs 465-035W systolic. Continue Toprol (increase as above) and lisinopril-HCTZ.   Disposition: F/u with me in 6-8 weeks post-monitor.  Medication Adjustments/Labs and Tests Ordered: Current medicines are reviewed at length with the patient today.  Concerns regarding medicines are outlined above. Medication changes, Labs and Tests ordered today are summarized above and listed in the Patient Instructions accessible in Encounters.   Signed, Charlie Pitter, PA-C  05/26/2017 10:37 AM    [address], [place], [place]  [address] Phone: [phone]; Fax: [phone]

## 2017-05-26 ENCOUNTER — Ambulatory Visit (INDEPENDENT_AMBULATORY_CARE_PROVIDER_SITE_OTHER): Payer: BLUE CROSS/BLUE SHIELD | Admitting: Physician Assistant

## 2017-05-26 ENCOUNTER — Encounter: Payer: Self-pay | Admitting: Physician Assistant

## 2017-05-26 VITALS — BP 140/70 | HR 82 | Resp 16 | Ht 63.0 in | Wt 276.8 lb

## 2017-05-26 DIAGNOSIS — I10 Essential (primary) hypertension: Secondary | ICD-10-CM

## 2017-05-26 DIAGNOSIS — R0602 Shortness of breath: Secondary | ICD-10-CM | POA: Diagnosis not present

## 2017-05-26 DIAGNOSIS — R002 Palpitations: Secondary | ICD-10-CM | POA: Diagnosis not present

## 2017-05-26 DIAGNOSIS — I5032 Chronic diastolic (congestive) heart failure: Secondary | ICD-10-CM | POA: Diagnosis not present

## 2017-05-26 LAB — BASIC METABOLIC PANEL
BUN / CREAT RATIO: 18 (ref 9–23)
BUN: 14 mg/dL (ref 6–24)
CALCIUM: 10 mg/dL (ref 8.7–10.2)
CO2: 26 mmol/L (ref 20–29)
CREATININE: 0.8 mg/dL (ref 0.57–1.00)
Chloride: 101 mmol/L (ref 96–106)
GFR calc non Af Amer: 82 mL/min/{1.73_m2} (ref >59)
GFR, EST AFRICAN AMERICAN: 95 mL/min/{1.73_m2} (ref >59)
Glucose: 123 mg/dL — ABNORMAL HIGH (ref 65–99)
Potassium: 4.6 mmol/L (ref 3.5–5.2)
Sodium: 142 mmol/L (ref 134–144)

## 2017-05-26 LAB — CBC
HEMATOCRIT: 41.6 % (ref 34.0–46.6)
HEMOGLOBIN: 13.6 g/dL (ref 11.1–15.9)
MCH: 28.2 pg (ref 26.6–33.0)
MCHC: 32.7 g/dL (ref 31.5–35.7)
MCV: 86 fL (ref 79–97)
Platelets: 358 10*3/uL (ref 150–379)
RBC: 4.82 x10E6/uL (ref 3.77–5.28)
RDW: 14.5 % (ref 12.3–15.4)
WBC: 10.3 10*3/uL (ref 3.4–10.8)

## 2017-05-26 LAB — PRO B NATRIURETIC PEPTIDE: NT-Pro BNP: 117 pg/mL (ref 0–287)

## 2017-05-26 LAB — TSH: TSH: 1.6 u[IU]/mL (ref 0.450–4.500)

## 2017-05-26 LAB — MAGNESIUM: Magnesium: 2 mg/dL (ref 1.6–2.3)

## 2017-05-26 MED ORDER — METOPROLOL SUCCINATE ER 25 MG PO TB24: 37.5000 mg | ORAL_TABLET | Freq: Every day | ORAL | 3 refills | Status: DC

## 2017-05-26 NOTE — Patient Instructions (Addendum)
Medication Instructions:  Your physician has recommended you make the following change in your medication: 1.  INCREASE the Toprol 25 mg to taking 1 1/2 tablet daily   Labwork: TODAY:  BMET, CBC, MAGNESIUM, TSH, & PRO BNP  Testing/Procedures: Your physician has requested that you have an echocardiogram. Echocardiography is a painless test that uses sound waves to create images of your heart. It provides your doctor with information about the size and shape of your heart and how well your heart's chambers and valves are working. This procedure takes approximately one hour. There are no restrictions for this procedure.   Your physician has recommended that you wear an event monitor. Event monitors are medical devices that record the heart's electrical activity. Doctors most often [place] these monitors to diagnose arrhythmias. Arrhythmias are problems with the speed or rhythm of the heartbeat. The monitor is a small, portable device. You can wear one while you do your normal daily activities. This is usually used to diagnose what is causing palpitations/syncope (passing out).    Follow-Up: Your physician recommends that you schedule a follow-up appointment in: [place], PA-C   Any Other Special Instructions Will Be Listed Below (If Applicable).  Echocardiogram An echocardiogram, or echocardiography, uses sound waves (ultrasound) to produce an image of your heart. The echocardiogram is simple, painless, obtained within a short period of time, and offers valuable information to your health care provider. The images from an echocardiogram can provide information such as:  Evidence of coronary artery disease (CAD).  Heart size.  Heart muscle function.  Heart valve function.  Aneurysm detection.  Evidence of a past heart attack.  Fluid buildup around the heart.  Heart muscle thickening.  Assess heart valve function.  Tell a health care provider about:  Any allergies  you have.  All medicines you are taking, including vitamins, herbs, eye drops, creams, and over-the-counter medicines.  Any problems you or family members have had with anesthetic medicines.  Any blood disorders you have.  Any surgeries you have had.  Any medical conditions you have.  Whether you are pregnant or may be pregnant. What happens before the procedure? No special preparation is needed. Eat and drink normally. What happens during the procedure?  In order to produce an image of your heart, gel will be applied to your chest and a wand-like tool (transducer) will be moved over your chest. The gel will help transmit the sound waves from the transducer. The sound waves will harmlessly bounce off your heart to allow the heart images to be captured in real-time motion. These images will then be recorded.  You may need an IV to receive a medicine that improves the quality of the pictures. What happens after the procedure? You may return to your normal schedule including diet, activities, and medicines, unless your health care provider tells you otherwise. This information is not intended to replace advice given to you by your health care provider. Make sure you discuss any questions you have with your health care provider. Document Released: 06/03/2000 Document Revised: 01/23/2016 Document Reviewed: 02/11/2013 Elsevier Interactive Patient Education  2017 [place].   Cardiac Event Monitoring A cardiac event monitor is a small recording device that is used to detect abnormal heart rhythms (arrhythmias). The monitor is used to record your heart rhythm when you have symptoms, such as:  Fast heartbeats (palpitations), such as heart racing or fluttering.  Dizziness.  Fainting or light-headedness.  Unexplained weakness.  Some monitors are wired  to electrodes placed on your chest. Electrodes are flat, sticky disks that attach to your skin. Other monitors may be hand-held or worn  on the wrist. The monitor can be worn for up to 30 days. If the monitor is attached to your chest, a technician will prepare your chest for the electrode placement and show you how to work the monitor. Take time to practice using the monitor before you leave the office. Make sure you understand how to send the information from the monitor to your health care provider. In some cases, you may need to use a landline telephone instead of a cell phone. What are the risks? Generally, this device is safe to use, but it possible that the skin under the electrodes will become irritated. How to use your cardiac event monitor  Wear your monitor at all times, except when you are in water: ? Do not let the monitor get wet. ? Take the monitor off when you bathe. Do not swim or use a hot tub with it on.  Keep your skin clean. Do not put body lotion or moisturizer on your chest.  Change the electrodes as told by your health care provider or any time they stop sticking to your skin. You may need to use medical tape to keep them on.  Try to put the electrodes in slightly different places on your chest to help prevent skin irritation. They must remain in the area under your left breast and in the upper right section of your chest.  Make sure the monitor is safely clipped to your clothing or in a location close to your body that your health care provider recommends.  Press the button to record as soon as you feel heart-related symptoms, such as: ? Dizziness. ? Weakness. ? Light-headedness. ? Palpitations. ? Thumping or pounding in your chest. ? Shortness of breath. ? Unexplained weakness.  Keep a diary of your activities, such as walking, doing chores, and taking medicine. It is very important to note what you were doing when you pushed the button to record your symptoms. This will help your health care provider determine what might be contributing to your symptoms.  Send the recorded information as  recommended by your health care provider. It may take some time for your health care provider to process the results.  Change the batteries as told by your health care provider.  Keep electronic devices away from your monitor. This includes: ? Tablets. ? MP3 players. ? Cell phones.  While wearing your monitor you should avoid: ? Electric blankets. ? Armed forces operational officer. ? Electric toothbrushes. ? Microwave ovens. ? Magnets. ? Metal detectors. Get help right away if:  You have chest pain.  You have extreme difficulty breathing or shortness of breath.  You develop a very fast heartbeat that persists.  You develop dizziness that does not go away.  You faint or constantly feel like you are about to faint. Summary  A cardiac event monitor is a small recording device that is used to help detect abnormal heart rhythms (arrhythmias).  The monitor is used to record your heart rhythm when you have heart-related symptoms.  Make sure you understand how to send the information from the monitor to your health care provider.  It is important to press the button on the monitor when you have any heart-related symptoms.  Keep a diary of your activities, such as walking, doing chores, and taking medicine. It is very important to note what you were doing when  you pushed the button to record your symptoms. This will help your health care provider learn what might be causing your symptoms. This information is not intended to replace advice given to you by your health care provider. Make sure you discuss any questions you have with your health care provider. Document Released: 03/15/2008 Document Revised: 05/21/2016 Document Reviewed: 05/21/2016 Elsevier Interactive Patient Education  2017 [employer].   If you need a refill on your cardiac medications before your next appointment, please call your pharmacy.

## 2017-05-28 ENCOUNTER — Other Ambulatory Visit: Payer: Self-pay | Admitting: Interventional Cardiology

## 2017-06-08 ENCOUNTER — Other Ambulatory Visit (HOSPITAL_COMMUNITY): Payer: BLUE CROSS/BLUE SHIELD

## 2017-06-21 DIAGNOSIS — L820 Inflamed seborrheic keratosis: Secondary | ICD-10-CM | POA: Diagnosis not present

## 2017-06-29 ENCOUNTER — Other Ambulatory Visit: Payer: Self-pay | Admitting: Endocrinology

## 2017-06-29 ENCOUNTER — Ambulatory Visit (HOSPITAL_COMMUNITY): Payer: BLUE CROSS/BLUE SHIELD | Attending: Cardiology

## 2017-06-29 ENCOUNTER — Other Ambulatory Visit (HOSPITAL_COMMUNITY): Payer: BLUE CROSS/BLUE SHIELD

## 2017-06-29 ENCOUNTER — Ambulatory Visit (INDEPENDENT_AMBULATORY_CARE_PROVIDER_SITE_OTHER): Payer: BLUE CROSS/BLUE SHIELD

## 2017-06-29 ENCOUNTER — Other Ambulatory Visit: Payer: Self-pay

## 2017-06-29 DIAGNOSIS — R0602 Shortness of breath: Secondary | ICD-10-CM | POA: Insufficient documentation

## 2017-06-29 DIAGNOSIS — R002 Palpitations: Secondary | ICD-10-CM | POA: Diagnosis not present

## 2017-07-17 ENCOUNTER — Encounter: Payer: Self-pay | Admitting: Cardiology

## 2017-07-17 ENCOUNTER — Ambulatory Visit (INDEPENDENT_AMBULATORY_CARE_PROVIDER_SITE_OTHER): Payer: BLUE CROSS/BLUE SHIELD | Admitting: Cardiology

## 2017-07-17 VITALS — BP 136/70 | HR 96 | Ht 62.0 in | Wt 278.8 lb

## 2017-07-17 DIAGNOSIS — I5032 Chronic diastolic (congestive) heart failure: Secondary | ICD-10-CM | POA: Diagnosis not present

## 2017-07-17 DIAGNOSIS — Z9989 Dependence on other enabling machines and devices: Secondary | ICD-10-CM | POA: Diagnosis not present

## 2017-07-17 DIAGNOSIS — G4733 Obstructive sleep apnea (adult) (pediatric): Secondary | ICD-10-CM

## 2017-07-17 DIAGNOSIS — I10 Essential (primary) hypertension: Secondary | ICD-10-CM | POA: Diagnosis not present

## 2017-07-17 NOTE — Progress Notes (Signed)
Cardiology Office Note:    Date:  07/17/2017   ID:  Jaime Nichols, DOB 09/17/1960, MRN [id number]  PCP:  Leighton Ruff, MD  Cardiologist:  No primary care provider on file.    Referring MD: Leighton Ruff, MD   Chief Complaint  Patient presents with  . Sleep Apnea  . Hypertension    History of Present Illness:    Jaime Nichols is a 57 y.o. female with a hx of OSA, obesity and HTN.  She is doing well with her CPAP device.  She tolerates the mask and feels the pressure is adequate.  Since going on CPAP she feels rested in the am and has no significant daytime sleepiness.  She denies any significant mouth or nasal dryness or nasal congestion.  She does not think that he snores.   She denies any chest pain or pressure, SOB, DOE, PND, orthopnea, LE edema, dizziness, palpitations or syncope. She is compliant with her meds and is tolerating meds with no SE.    Past Medical History:  Diagnosis Date  . Chronic diastolic CHF (congestive heart failure) ([place])   . Diabetes mellitus type 2 in obese ([place])   . Fibroid   . HTN (hypertension)   . Hyperlipidemia   . OSA on CPAP     Past Surgical History:  Procedure Laterality Date  . ABDOMINAL HYSTERECTOMY  2010   BSO  . Benign uterine polyps  01/2009  . DILATION AND CURETTAGE OF UTERUS    . HYSTEROSCOPY      Current Medications: Current Meds  Medication Sig  . CALCIUM CITRATE PO Take 600 mg by mouth daily.  Marland Kitchen CLINPRO 5000 1.1 % PSTE Take 1 application by mouth 2 (two) times daily. Reported on 08/11/2015  . glimepiride (AMARYL) 4 MG tablet TAKE 1 TABLET BY MOUTH EVERY DAY BEFORE BREAKFAST  . JANUVIA 100 MG tablet TAKE 1 TABLET BY MOUTH EVERY DAY  . lisinopril-hydrochlorothiazide (PRINZIDE,ZESTORETIC) 10-12.5 MG per tablet Take 1 tablet by mouth daily.   Marland Kitchen MAGNESIUM PO Take 1 tablet by mouth daily.  . metFORMIN (GLUCOPHAGE-XR) 500 MG 24 hr tablet TAKE 2 TABLETS BY MOUTH TWICE A DAY WITH MEALS  . metoprolol succinate  (TOPROL XL) 25 MG 24 hr tablet Take 1.5 tablets (37.5 mg total) by mouth daily.  . rosuvastatin (CRESTOR) 20 MG tablet Take 20 mg by mouth every other day.      Allergies:   Latex; Erythromycin; and Statins   Social History   Socioeconomic History  . Marital status: Married    Spouse name: None  . Number of children: None  . Years of education: None  . Highest education level: None  Social Needs  . Financial resource strain: None  . Food insecurity - worry: None  . Food insecurity - inability: None  . Transportation needs - medical: None  . Transportation needs - non-medical: None  Occupational History  . None  Tobacco Use  . Smoking status: Never Smoker  . Smokeless tobacco: Never Used  Substance and Sexual Activity  . Alcohol use: Yes    Comment: occ.  . Drug use: No  . Sexual activity: Yes    Partners: Male    Birth control/protection: Surgical    Comment: hyst  Other Topics Concern  . None  Social History Narrative  . None     Family History: The patient's family history includes Depression in her brother; Diabetes in her paternal grandmother; Healthy in her sister and sister; Heart disease  in her father; Hypertension in her mother.  ROS:   Please see the history of present illness.    ROS  All other systems reviewed and negative.   EKGs/Labs/Other Studies Reviewed:    The following studies were reviewed today: CPAP download  EKG:  EKG is not ordered today.    Recent Labs: 05/26/2017: BUN 14; Creatinine, Ser 0.80; Hemoglobin 13.6; Magnesium 2.0; NT-Pro BNP 117; Platelets 358; Potassium 4.6; Sodium 142; TSH 1.600   Recent Lipid Panel No results found for: CHOL, TRIG, HDL, CHOLHDL, VLDL, LDLCALC, LDLDIRECT  Physical Exam:    VS:  BP 136/70   Pulse 96   Ht 5\' 2"  (1.575 m)   Wt 278 lb 12.8 oz (126.5 kg)   BMI 50.99 kg/m     Wt Readings from Last 3 Encounters:  07/17/17 278 lb 12.8 oz (126.5 kg)  05/26/17 276 lb 12.8 oz (125.6 kg)  04/03/17 277 lb  (125.6 kg)     GEN:  Well nourished, well developed in no acute distress HEENT: Normal NECK: No JVD; No carotid bruits LYMPHATICS: No lymphadenopathy CARDIAC: RRR, no murmurs, rubs, gallops RESPIRATORY:  Clear to auscultation without rales, wheezing or rhonchi  ABDOMEN: Soft, non-tender, non-distended MUSCULOSKELETAL:  No edema; No deformity  SKIN: Warm and dry NEUROLOGIC:  Alert and oriented x 3 PSYCHIATRIC:  Normal affect   ASSESSMENT:    1. OSA on CPAP   2. Essential hypertension   3. Morbid obesity ([place])   4. Chronic diastolic CHF (congestive heart failure) ([facility])    PLAN:    In order of problems listed above:  1.  OSA - the patient is tolerating PAP therapy well without any problems. The PAP download was reviewed today and showed an AHI of 0.4/hr on 12.6 cm H2O with 100% compliance in using more than 4 hours nightly.  The patient has been using and benefiting from PAP use and will continue to benefit from therapy.   2.  HTN - BP is well controlled on exam today.  She will continue on Lisinopril HCT 10-12.5mg  daily and Toprol XL 37.5mg  daily  Her creatinine is stable at 0.8.  3.  Morbid obesity - I have strongly encouraged her to get into a routine exercise program and cut back on carbs and portions.   4.  Chronic diastolic CHF - she appears euvolemic on exam and weight is stable.  She has chronic DOE that she feels is stable and likely related to underlying obesity.     Medication Adjustments/Labs and Tests Ordered: Current medicines are reviewed at length with the patient today.  Concerns regarding medicines are outlined above.  No orders of the defined types were placed in this encounter.  No orders of the defined types were placed in this encounter.   Signed, Fransico Him, MD  07/17/2017 12:58 PM    [facility]

## 2017-07-17 NOTE — Patient Instructions (Signed)

## 2017-07-21 DIAGNOSIS — E559 Vitamin D deficiency, unspecified: Secondary | ICD-10-CM | POA: Diagnosis not present

## 2017-07-21 DIAGNOSIS — E119 Type 2 diabetes mellitus without complications: Secondary | ICD-10-CM | POA: Diagnosis not present

## 2017-07-21 DIAGNOSIS — R002 Palpitations: Secondary | ICD-10-CM | POA: Diagnosis not present

## 2017-07-21 DIAGNOSIS — E782 Mixed hyperlipidemia: Secondary | ICD-10-CM | POA: Diagnosis not present

## 2017-07-27 ENCOUNTER — Ambulatory Visit: Payer: BLUE CROSS/BLUE SHIELD | Admitting: Physician Assistant

## 2017-07-31 ENCOUNTER — Other Ambulatory Visit: Payer: Self-pay | Admitting: Endocrinology

## 2017-08-02 ENCOUNTER — Telehealth: Payer: Self-pay

## 2017-08-02 ENCOUNTER — Other Ambulatory Visit: Payer: Self-pay

## 2017-08-02 MED ORDER — SITAGLIPTIN PHOSPHATE 100 MG PO TABS: 100.0000 mg | ORAL_TABLET | Freq: Every day | ORAL | 0 refills | Status: DC

## 2017-08-02 NOTE — Telephone Encounter (Signed)
Please refill x 1 Ov is due  

## 2017-08-02 NOTE — Telephone Encounter (Signed)
Patient requesting a refill on Januvia but she has not been seen since 02/24/16 with no future appointments scheduled- refill or refuse please advise

## 2017-08-02 NOTE — Telephone Encounter (Signed)
I have sent to patient;'s pharmacy.  

## 2017-08-03 ENCOUNTER — Telehealth: Payer: Self-pay

## 2017-08-03 DIAGNOSIS — I493 Ventricular premature depolarization: Secondary | ICD-10-CM

## 2017-08-03 MED ORDER — METOPROLOL SUCCINATE ER 50 MG PO TB24: 50.0000 mg | ORAL_TABLET | Freq: Every day | ORAL | 0 refills | Status: DC

## 2017-08-03 NOTE — Telephone Encounter (Signed)
Notes recorded by Teressa Senter, RN on 08/03/2017 at 2:25 PM EST Patient made aware of results and Dr. Theodosia Blender recommendation to INCREASE Toprol to 50 mg and patient scheduled for 24 holter monitor on 08/08/17. Patient in agreement with plan and thanked me for the call.   Notes recorded by Sueanne Margarita, MD on 08/02/2017 at 10:28 AM EST Heart monitor showed NSR with frequent PVCs and an episode of nonsustained atrial tachy. No afib. Increase toprol XL to 50mg  daily and get a 24 hour HOlter to assess PVC load

## 2017-08-03 NOTE — Telephone Encounter (Signed)
Please refill x 1 Ov is due  

## 2017-08-04 ENCOUNTER — Other Ambulatory Visit: Payer: Self-pay

## 2017-08-04 MED ORDER — SITAGLIPTIN PHOSPHATE 100 MG PO TABS: 100.0000 mg | ORAL_TABLET | Freq: Every day | ORAL | 0 refills | Status: DC

## 2017-08-08 ENCOUNTER — Ambulatory Visit (INDEPENDENT_AMBULATORY_CARE_PROVIDER_SITE_OTHER): Payer: BLUE CROSS/BLUE SHIELD

## 2017-08-08 DIAGNOSIS — I493 Ventricular premature depolarization: Secondary | ICD-10-CM

## 2017-08-14 ENCOUNTER — Encounter: Payer: Self-pay | Admitting: Physician Assistant

## 2017-08-14 NOTE — Progress Notes (Signed)
Cardiology Office Note    Date:  08/16/2017  ID:  Jaime Nichols, DOB 1960/11/02, MRN [id number] PCP:  Leighton Ruff, MD  Cardiologist:  Dr. Radford Pax   Chief Complaint: f/u event monitor  History of Present Illness:  Jaime Nichols is a 57 y.o. female with history of OSA, morbid obesity, HTN, chronic diastolic CHF, DM who presents for f/u of palpitations (PVCs, atrial tach) and dyspnea.  She has a longstanding history of dyspnea with exertion. She even recalls a stress test from many years ago when she weighed 150lb where she had an exaggerated HR response with exercise. She was told it was due to deconditioning. She fell off a ladder many years ago and attributes substantial weight gain to that event. She also has 2 bad knees which need replacing, but has been told this cannot occur until she loses some weight. She was recently seen in clinic 05/2017 for palpitations and dyspnea with sensation of heart racing in her throat.  She also felt that if she stood and walked that she might pass out. During this time, her BP was lower than her usual (eg. 113/41) and her HR per her BP monitor was in the low 100s. The palpitations subsided after 4-6 hrs; however, she generally felt unwell all weekend. She wondered if this was perhaps related to the Zuppa toscana soup she made with her husband which was extremely salty. Labs showed normal TSH, CBC, Mg, BNP. 2D Echo 06/2017 showed EF 60-65%, grade 2 DD, high ventricular filling pressure, mild LAE, PASP 36mmHg. Event monitor showed NSR/sinus tach with average HR 87bpm, occasional PVCs, bigeminal and trigeminal, and nonsustained atrial tach. Dr. Radford Pax increased her Toprol to 50mg  daily and arranged subsequent f/u 24 hour monitor which showed average HR 84 (58->126 range), occasional PVCs with load 1.28%, and occasional PACs in form of singles, couplets, and triplets.  She returns for follow-up today. To clarify she reports a longstanding history of  DOE but feels it has been worsening ever since December. She stopped all magnesium/calcium supplements because she thought this might be contributing to the palpitations. She does feel the metoprolol increased has helped some but but she continues to notice SOB/heart racing with minimal activity. She also notices palpitations (both tachy and flipping/skipping) after she eats dinner or when she goes to lie down at night. Her dad died in his 25s of a cardiac event - they were told it was atherosclerosis, but he apparently also had PVCs. Her sister has PVCs and a more distant relative has WPW. She reports remote stress tests but had a traumatic experience feeling like she was pushed too far on the treadmill one time. She is compliant with CPAP.   Past Medical History:  Diagnosis Date  . Chronic diastolic CHF (congestive heart failure) ([place])   . Diabetes mellitus type 2 in obese ([place])   . Fibroid   . HTN (hypertension)   . Hyperlipidemia   . OSA on CPAP   . PAT (paroxysmal atrial tachycardia) ([place])   . PVC's (premature ventricular contractions)     Past Surgical History:  Procedure Laterality Date  . ABDOMINAL HYSTERECTOMY  2010   BSO  . Benign uterine polyps  01/2009  . DILATION AND CURETTAGE OF UTERUS    . HYSTEROSCOPY      Current Medications: Current Meds  Medication Sig  . CLINPRO 5000 1.1 % PSTE Take 1 application by mouth 2 (two) times daily. Reported on 08/11/2015  . glimepiride (AMARYL) 4  MG tablet TAKE 1 TABLET BY MOUTH EVERY DAY BEFORE BREAKFAST  . lisinopril-hydrochlorothiazide (PRINZIDE,ZESTORETIC) 10-12.5 MG per tablet Take 1 tablet by mouth daily.   . metFORMIN (GLUCOPHAGE-XR) 500 MG 24 hr tablet TAKE 2 TABLETS BY MOUTH TWICE A DAY WITH MEALS  . metoprolol succinate (TOPROL-XL) 50 MG 24 hr tablet Take 1 tablet (50 mg total) by mouth daily. Take with or immediately following a meal.  . rosuvastatin (CRESTOR) 20 MG tablet Take 20 mg by mouth every other day.   . sitaGLIPtin  (JANUVIA) 100 MG tablet Take 1 tablet (100 mg total) by mouth daily.    Allergies:   Latex; Erythromycin; and Statins   Social History   Socioeconomic History  . Marital status: Married    Spouse name: None  . Number of children: None  . Years of education: None  . Highest education level: None  Social Needs  . Financial resource strain: None  . Food insecurity - worry: None  . Food insecurity - inability: None  . Transportation needs - medical: None  . Transportation needs - non-medical: None  Occupational History  . None  Tobacco Use  . Smoking status: Never Smoker  . Smokeless tobacco: Never Used  Substance and Sexual Activity  . Alcohol use: Yes    Comment: occ.  . Drug use: No  . Sexual activity: Yes    Partners: Male    Birth control/protection: Surgical    Comment: hyst  Other Topics Concern  . None  Social History Narrative  . None     Family History:  Family History  Problem Relation Age of Onset  . Hypertension Mother   . Heart disease Father   . Healthy Sister   . Depression Brother   . Healthy Sister   . Diabetes Paternal Grandmother     ROS:   Please see the history of present illness.  All other systems are reviewed and otherwise negative.    PHYSICAL EXAM:   VS:  BP 132/64   Pulse 88   Ht 5\' 3"  (1.6 m)   Wt 280 lb (127 kg)   SpO2 96%   BMI 49.60 kg/m   BMI: Body mass index is 49.6 kg/m. GEN: Well nourished, well developed morbidly obese WF, in no acute distress  HEENT: normocephalic, atraumatic Neck: no JVD, carotid bruits, or masses Cardiac: RRR; no murmurs, rubs, or gallops, no edema  Respiratory:  clear to auscultation bilaterally, normal work of breathing GI: soft, nontender, nondistended, + BS MS: no deformity or atrophy  Skin: warm and dry, no rash Neuro:  Alert and Oriented x 3, Strength and sensation are intact, follows commands Psych: euthymic mood, full affect  Wt Readings from Last 3 Encounters:  08/16/17 280 lb (127  kg)  07/17/17 278 lb 12.8 oz (126.5 kg)  05/26/17 276 lb 12.8 oz (125.6 kg)      Studies/Labs Reviewed:   EKG:  EKG was ordered today and personally reviewed by me and demonstrates NSR 86bpm, possible prior anteroseptal infarct, no acute ST-T changes  Recent Labs: 05/26/2017: BUN 14; Creatinine, Ser 0.80; Hemoglobin 13.6; Magnesium 2.0; NT-Pro BNP 117; Platelets 358; Potassium 4.6; Sodium 142; TSH 1.600   Lipid Panel No results found for: CHOL, TRIG, HDL, CHOLHDL, VLDL, LDLCALC, LDLDIRECT  Additional studies/ records that were reviewed today include: Summarized above    ASSESSMENT & PLAN:   1. Dyspnea on exertion - suspect multifactorial from deconditioning, morbid obesity (?early OHS), ectopy, and chronic diastolic CHF. We walked  her in clinic today and her HR went to 129 and pulse ox dropped briefly to 88% transiently but came up to 93% then back up to 96% upon rest. It took a while for her heart rate to recover back to baseline (her normal baseline appears in the 80s-90s). She reports acute on chronic dyspnea ever since December. Given these findings, will check stat d-dimer. If abnormal, I would recommend a CT angio to definitively rule out PE (will sign this out to APP on call this evening). Will also plan on 2V CXR. Recent labs otherwise by PCP earlier this month showed Hgb 13.6, Cr 0.777, K 4.3, TSH 1.850, A1C 7.1, LDL 72. On event monitor result, Dr. Radford Pax recommended titration of Toprol to 75mg  daily. The patient is hesitant because she is concerned about being sensitive to medication but is agreeable. Given her family history will also update her stress test. She had a bad experience with a treadmill test in the past but is agreeable to [place]. This will likely be a 2-day test given her size. Recent BNP for similar sx was normal but unfortunately her obesity could mask a potential rise related to HF. Empiric trial of diuretic could be considered if the above evaluation is  unrevealing and she has no improvement with symptoms after titration of beta blocker. 2. Palpitations - PVCs/PACs/paroxysmal atrial tach - increase metoprolol was above. 3. Chronic diastolic CHF - volume status is difficult to assess given BMI of almost 50 but her lungs sound clear and she does not have any pitting edema on exam, only large baseline leg habitus. If symptoms do not improve with increasing beta blocker, could consider trial of empiric diuretic. 4. HTN - follow BP with med changes. 5. Morbid obesity/OSA - CPAP followed by Dr. Radford Pax. Long discussion about role of weight as I feel her smyptoms are only likely to worsen rather than improve unless this is addressed. This has been a long struggle for her. I am worried given her HR/O2 response to exercise that this may represent early manifestations of obesity hypoventilation syndrome.  Disposition: F/u with Dr. Betsy Pries team APP in 4 weeks.   Medication Adjustments/Labs and Tests Ordered: Current medicines are reviewed at length with the patient today.  Concerns regarding medicines are outlined above. Medication changes, Labs and Tests ordered today are summarized above and listed in the Patient Instructions accessible in Encounters.   Signed, Charlie Pitter, PA-C  08/16/2017 3:32 PM    [facility] [place], [place], [place]  [address] Phone: [phone]; Fax: [phone]

## 2017-08-15 DIAGNOSIS — H5213 Myopia, bilateral: Secondary | ICD-10-CM | POA: Diagnosis not present

## 2017-08-15 DIAGNOSIS — E119 Type 2 diabetes mellitus without complications: Secondary | ICD-10-CM | POA: Diagnosis not present

## 2017-08-16 ENCOUNTER — Ambulatory Visit
Admission: RE | Admit: 2017-08-16 | Discharge: 2017-08-16 | Disposition: A | Discharge status: Home / Self Care | Payer: BLUE CROSS/BLUE SHIELD | Source: Ambulatory Visit | Attending: Physician Assistant | Admitting: Physician Assistant

## 2017-08-16 ENCOUNTER — Ambulatory Visit (INDEPENDENT_AMBULATORY_CARE_PROVIDER_SITE_OTHER): Payer: BLUE CROSS/BLUE SHIELD | Admitting: Physician Assistant

## 2017-08-16 ENCOUNTER — Encounter: Payer: Self-pay | Admitting: Physician Assistant

## 2017-08-16 VITALS — BP 132/64 | HR 88 | Ht 63.0 in | Wt 280.0 lb

## 2017-08-16 DIAGNOSIS — R0609 Other forms of dyspnea: Principal | ICD-10-CM

## 2017-08-16 DIAGNOSIS — I493 Ventricular premature depolarization: Secondary | ICD-10-CM | POA: Diagnosis not present

## 2017-08-16 DIAGNOSIS — I5032 Chronic diastolic (congestive) heart failure: Secondary | ICD-10-CM

## 2017-08-16 DIAGNOSIS — R0600 Dyspnea, unspecified: Secondary | ICD-10-CM

## 2017-08-16 DIAGNOSIS — I471 Supraventricular tachycardia: Secondary | ICD-10-CM

## 2017-08-16 DIAGNOSIS — G4733 Obstructive sleep apnea (adult) (pediatric): Secondary | ICD-10-CM

## 2017-08-16 DIAGNOSIS — R0602 Shortness of breath: Secondary | ICD-10-CM | POA: Diagnosis not present

## 2017-08-16 DIAGNOSIS — I10 Essential (primary) hypertension: Secondary | ICD-10-CM

## 2017-08-16 DIAGNOSIS — Z9989 Dependence on other enabling machines and devices: Secondary | ICD-10-CM | POA: Diagnosis not present

## 2017-08-16 DIAGNOSIS — I491 Atrial premature depolarization: Secondary | ICD-10-CM

## 2017-08-16 LAB — D-DIMER, QUANTITATIVE (NOT AT ARMC): D-DIMER: 0.53 mg{FEU}/L — AB (ref 0.00–0.49)

## 2017-08-16 MED ORDER — METOPROLOL SUCCINATE ER 50 MG PO TB24: 50.0000 mg | ORAL_TABLET | Freq: Every day | ORAL | 3 refills | Status: DC

## 2017-08-16 MED ORDER — METOPROLOL SUCCINATE ER 25 MG PO TB24: 25.0000 mg | ORAL_TABLET | Freq: Every day | ORAL | 3 refills | Status: DC

## 2017-08-16 NOTE — Patient Instructions (Addendum)
Medication Instructions:  Your physician has recommended you make the following change in your medication:  1-INCREASE Metoprolol 75 mg by mouth daily.  Labwork: Your physician recommends that you have lab work today- d-dimer.   Testing/Procedures: Your physician has requested that you have a lexiscan myoview. For further information please visit [email or web address]. Please follow instruction sheet, as given.  A chest x-ray takes a picture of the organs and structures inside the chest, including the heart, lungs, and blood vessels. This test can show several things, including, whether the heart is enlarges; whether fluid is building up in the lungs; and whether pacemaker / defibrillator leads are still in place.  Follow-Up: Your physician wants you to follow-up in: 1 months with Dr. Radford Pax or Melina Copa PA.   If you need a refill on your cardiac medications before your next appointment, please call your pharmacy.

## 2017-08-17 ENCOUNTER — Telehealth: Payer: Self-pay | Admitting: Physician Assistant

## 2017-08-17 NOTE — Telephone Encounter (Signed)
NEW MESSAGE ° ° ° °Patient calling for lab results. Please call °

## 2017-08-17 NOTE — Telephone Encounter (Signed)
Pt is aware of D- dimer,chest xray results and PA's recommendations. Pt verbalized understanding. Pt states that she didn't refused to the weight management when it was recommended the first time. Pt said that she did go to a nutritionist.

## 2017-08-20 ENCOUNTER — Other Ambulatory Visit: Payer: Self-pay | Admitting: Endocrinology

## 2017-08-20 NOTE — Telephone Encounter (Signed)
Please refill x 1 Ov is due  

## 2017-08-21 ENCOUNTER — Telehealth (HOSPITAL_COMMUNITY): Payer: Self-pay | Admitting: *Deleted

## 2017-08-21 NOTE — Telephone Encounter (Signed)
Patient given detailed instructions per Myocardial Perfusion Study Information Sheet for the test on 08/23/17 at 1230. Patient notified to arrive 15 minutes early and that it is imperative to arrive on time for appointment to keep from having the test rescheduled.  If you need to cancel or reschedule your appointment, please call the office within 24 hours of your appointment. . Patient verbalized understanding.Jaime Nichols, Ranae Palms

## 2017-08-22 DIAGNOSIS — R102 Pelvic and perineal pain: Secondary | ICD-10-CM | POA: Diagnosis not present

## 2017-08-22 DIAGNOSIS — N390 Urinary tract infection, site not specified: Secondary | ICD-10-CM | POA: Diagnosis not present

## 2017-08-23 ENCOUNTER — Ambulatory Visit (HOSPITAL_COMMUNITY): Payer: BLUE CROSS/BLUE SHIELD | Attending: Internal Medicine

## 2017-08-23 DIAGNOSIS — I110 Hypertensive heart disease with heart failure: Secondary | ICD-10-CM | POA: Insufficient documentation

## 2017-08-23 DIAGNOSIS — G4733 Obstructive sleep apnea (adult) (pediatric): Secondary | ICD-10-CM | POA: Insufficient documentation

## 2017-08-23 DIAGNOSIS — I10 Essential (primary) hypertension: Secondary | ICD-10-CM | POA: Diagnosis not present

## 2017-08-23 DIAGNOSIS — I5032 Chronic diastolic (congestive) heart failure: Secondary | ICD-10-CM | POA: Diagnosis not present

## 2017-08-23 DIAGNOSIS — R0600 Dyspnea, unspecified: Secondary | ICD-10-CM

## 2017-08-23 DIAGNOSIS — I493 Ventricular premature depolarization: Secondary | ICD-10-CM | POA: Diagnosis not present

## 2017-08-23 DIAGNOSIS — E119 Type 2 diabetes mellitus without complications: Secondary | ICD-10-CM | POA: Diagnosis not present

## 2017-08-23 DIAGNOSIS — I491 Atrial premature depolarization: Secondary | ICD-10-CM | POA: Diagnosis not present

## 2017-08-23 DIAGNOSIS — R0609 Other forms of dyspnea: Secondary | ICD-10-CM | POA: Insufficient documentation

## 2017-08-23 DIAGNOSIS — I471 Supraventricular tachycardia: Secondary | ICD-10-CM

## 2017-08-23 DIAGNOSIS — Z9989 Dependence on other enabling machines and devices: Secondary | ICD-10-CM

## 2017-08-23 MED ORDER — REGADENOSON 0.4 MG/5ML IV SOLN
0.4000 mg | Freq: Once | INTRAVENOUS | Status: AC
  Administered 2017-08-23: 0.4 mg via INTRAVENOUS

## 2017-08-23 MED ORDER — TECHNETIUM TC 99M TETROFOSMIN IV KIT
32.9000 | PACK | Freq: Once | INTRAVENOUS | Status: AC | PRN
  Administered 2017-08-23: 32.9 via INTRAVENOUS
  Filled 2017-08-23: qty 33

## 2017-08-24 ENCOUNTER — Ambulatory Visit (HOSPITAL_COMMUNITY): Payer: BLUE CROSS/BLUE SHIELD | Attending: Cardiology

## 2017-08-24 LAB — MYOCARDIAL PERFUSION IMAGING
CHL CUP NUCLEAR SRS: 2
CSEPPHR: 107 {beats}/min
LV dias vol: 91 mL (ref 46–106)
LV sys vol: 40 mL
NUC STRESS TID: 0.95
RATE: 0.29
Rest HR: 75 {beats}/min
SDS: 3
SSS: 5

## 2017-08-24 MED ORDER — TECHNETIUM TC 99M TETROFOSMIN IV KIT
31.8000 | PACK | Freq: Once | INTRAVENOUS | Status: AC | PRN
  Administered 2017-08-24: 31.8 via INTRAVENOUS
  Filled 2017-08-24: qty 32

## 2017-09-01 ENCOUNTER — Telehealth: Payer: Self-pay | Admitting: Cardiology

## 2017-09-01 NOTE — Telephone Encounter (Signed)
New Message   Would like to know if she is cleared for flying on the current medication she is on. Please call

## 2017-09-01 NOTE — Telephone Encounter (Signed)
She is fine to travel

## 2017-09-01 NOTE — Telephone Encounter (Signed)
Pt calling to ask Dr Radford Pax or Melina Copa PA-C if there is any contraindication with her current cardiac meds and cardiac history and taking a flight in later April, to a family members wedding. Pt states that she will need to purchase plane tickets in the very near future, and wants to make sure this is not contraindicated before purchasing. Informed the pt that when reviewing recent test and labs done, it appears she is safe to fly, but I will still run this by Dayna and Dr Radford Pax. Informed the pt that someone from the office will call her back, once advise provided.  Pt verbalized understanding and agrees with this plan.

## 2017-09-01 NOTE — Telephone Encounter (Signed)
Left message letting pt know ok to travel per Dr. Radford Pax. Advised to call back with any questions.

## 2017-09-13 ENCOUNTER — Other Ambulatory Visit: Payer: Self-pay | Admitting: Endocrinology

## 2017-09-14 NOTE — Telephone Encounter (Signed)
Please refill x 1 Ov is due  

## 2017-09-14 NOTE — Telephone Encounter (Signed)
Last ov 02/24/16 no future scheduled refill or refuse please advise

## 2017-09-18 ENCOUNTER — Ambulatory Visit (INDEPENDENT_AMBULATORY_CARE_PROVIDER_SITE_OTHER): Payer: BLUE CROSS/BLUE SHIELD | Admitting: Physician Assistant

## 2017-09-18 ENCOUNTER — Encounter: Payer: Self-pay | Admitting: Physician Assistant

## 2017-09-18 VITALS — BP 126/66 | HR 95 | Ht 62.0 in | Wt 279.1 lb

## 2017-09-18 DIAGNOSIS — I493 Ventricular premature depolarization: Secondary | ICD-10-CM | POA: Diagnosis not present

## 2017-09-18 DIAGNOSIS — I491 Atrial premature depolarization: Secondary | ICD-10-CM

## 2017-09-18 DIAGNOSIS — R0600 Dyspnea, unspecified: Secondary | ICD-10-CM

## 2017-09-18 DIAGNOSIS — I5032 Chronic diastolic (congestive) heart failure: Secondary | ICD-10-CM

## 2017-09-18 DIAGNOSIS — I471 Supraventricular tachycardia: Secondary | ICD-10-CM | POA: Diagnosis not present

## 2017-09-18 DIAGNOSIS — R0609 Other forms of dyspnea: Secondary | ICD-10-CM | POA: Diagnosis not present

## 2017-09-18 DIAGNOSIS — I10 Essential (primary) hypertension: Secondary | ICD-10-CM | POA: Diagnosis not present

## 2017-09-18 MED ORDER — DILTIAZEM HCL ER COATED BEADS 120 MG PO CP24: 120.0000 mg | ORAL_CAPSULE | Freq: Every day | ORAL | 3 refills | Status: DC

## 2017-09-18 MED ORDER — METOPROLOL SUCCINATE ER 25 MG PO TB24: 25.0000 mg | ORAL_TABLET | Freq: Every day | ORAL | 3 refills | Status: DC

## 2017-09-18 NOTE — Progress Notes (Signed)
Cardiology Office Note    Date:  09/18/2017  ID:  Jaime Nichols, DOB 1961-01-17, MRN [id number] PCP:  Leighton Ruff, MD  Cardiologist:  Dr. Radford Pax   Chief Complaint: f/u shortness of breath  History of Present Illness:  Jaime Nichols ([place]) is a 57 y.o. female with history of OSA, morbid obesity, HTN, chronic diastolic CHF, DM, nonsustained atrial tach, PACs, PVCs who presents for f/u of palpitations (PVCs, atrial tach) and dyspnea.  She has a longstanding history of dyspnea with exertion. She even recalls a stress test from many years ago when she weighed 150lb where she had an exaggerated HR response with exercise. She was told it was due to deconditioning. She fell off a ladder many years ago and attributes substantial weight gain to that event. She also has 2 arthritic knees which need replacing, but has been told this cannot occur until she loses some weight. She has been unable to do so thus far. She was seen in clinic 05/2017 for palpitations and dyspnea with sensation of heart racing in her throat. During this time, her BP was lower than her usual (eg. 113/41) and her HR per her BP monitor was in the low 100s. The palpitations subsided after 4-6 hrs but she generally felt unwell all that weekend. She wondered if this was perhaps related to the Zuppa toscana soup she made with her husband which was extremely salty. Labs showed normal TSH, CBC, Mg, BNP. 2D Echo 06/2017 showed EF 60-65%, grade 2 DD, high ventricular filling pressure, mild LAE, PASP 36mmHg. Event monitor showed NSR/sinus tach with average HR 87bpm, occasional PVCs, bigeminal and trigeminal, and nonsustained atrial tach. Dr. Radford Pax recommended to increase her Toprol to 50mg  daily and arranged subsequent f/u 24 hour monitor which showed average HR 84 (58->126 range), occasional PVCs with load 1.28%, and occasional PACs in form of singles, couplets, and triplets. When seen back in follow-up 07/2017, the  metoprolol had helped her palpitations but she continued to notice sensation of heart racing and dyspnea with minimal activity. She also reported episodic palpitations (both tachy and flipping/skipping) after eating dinner or lying down at night. She did report compliance with CPAP. At that visit, she ambulated and her HR went up to 129 and pulse ox dropped transiently to 88% but then came up to 93-96% on rest. It took a while for her HR to recover back to baseline to the 80s-90s. D-dimer was minimally up at 0.53 which, when adjusted for age criteria, was negative (<0.58). Nuclear stress test 08/23/17 was low risk with fixed small mild apical perfusion defect - given normal wall motion, suspect attenuation, no evidence for ischemia, EF 56%. CXR showed borderline cardiomegaly, aortic atherosclerosis, calcified mediastinal lymph nodes, clear lungs. Her dad died in his 17s of a cardiac event. They were told it was atherosclerosis, but he apparently also had PVCs. Her sister has PVCs and a more distant relative has WPW. It was recommended she titrate Toprol to 75mg  daily. She was extensively counseled on dire need for weight loss. No recent lipids on file as this is followed through PCP. She previously had issues with higher doses of rosuvastatin but is tolerating 20mg  QOD well.  She returns for follow-up feeling slightly better. She admits she was nervous to titrate the Toprol so actually only recently went from 25->50mg . That brought her palpitations down to twice a week, lasting 2-3 hours at a time, described as an uncomfortable sensation. She will periodically take an extra 1/2  Toprol and symptoms will ease off. She remains hesitant to fully titrate the Toprol but asks if there is perhaps a medicine that can control the rhythm better. Her DOE persists and as a result she is stuck in a pattern where she is completely sedentary because she becomes SOB with any attempts at exercise. This has been that way "even 175  pounds ago" - She states she is happy with status quo so finds it hard to get motivated.   Past Medical History:  Diagnosis Date  . Aortic atherosclerosis ([place])   . Chronic diastolic CHF (congestive heart failure) ([place])   . Diabetes mellitus type 2 in obese ([place])   . Fibroid   . HTN (hypertension)   . Hyperlipidemia   . Morbid obesity ([place])   . OSA on CPAP   . PAT (paroxysmal atrial tachycardia) ([place])   . Premature atrial contractions   . PVC's (premature ventricular contractions)     Past Surgical History:  Procedure Laterality Date  . ABDOMINAL HYSTERECTOMY  2010   BSO  . Benign uterine polyps  01/2009  . DILATION AND CURETTAGE OF UTERUS    . HYSTEROSCOPY      Current Medications: Current Meds  Medication Sig  . CLINPRO 5000 1.1 % PSTE Take 1 application by mouth 2 (two) times daily. Reported on 08/11/2015  . glimepiride (AMARYL) 4 MG tablet TAKE 1 TABLET BY MOUTH EVERY DAY BEFORE BREAKFAST  . lisinopril-hydrochlorothiazide (PRINZIDE,ZESTORETIC) 10-12.5 MG per tablet Take 1 tablet by mouth daily.   . metFORMIN (GLUCOPHAGE-XR) 500 MG 24 hr tablet TAKE 2 TABLETS BY MOUTH TWICE A DAY WITH MEALS  . metoprolol succinate (TOPROL XL) 25 MG 24 hr tablet Take 1 tablet (25 mg total) by mouth daily. Take with metoprolol succinate 50 mg to make dose 75 mg.  . metoprolol succinate (TOPROL-XL) 50 MG 24 hr tablet Take 1 tablet (50 mg total) by mouth daily. Take with metoprolol succinate 25 mg tablet to make dose 75 mg.  . rosuvastatin (CRESTOR) 20 MG tablet Take 20 mg by mouth every other day.   . sitaGLIPtin (JANUVIA) 100 MG tablet Take 1 tablet (100 mg total) by mouth daily.    Allergies:   Latex; Erythromycin; and Statins   Social History   Socioeconomic History  . Marital status: Married    Spouse name: Not on file  . Number of children: Not on file  . Years of education: Not on file  . Highest education level: Not on file  Occupational History  . Not on file  Social Needs    . Financial resource strain: Not on file  . Food insecurity:    Worry: Not on file    Inability: Not on file  . Transportation needs:    Medical: Not on file    Non-medical: Not on file  Tobacco Use  . Smoking status: Never Smoker  . Smokeless tobacco: Never Used  Substance and Sexual Activity  . Alcohol use: Yes    Comment: occ.  . Drug use: No  . Sexual activity: Yes    Partners: Male    Birth control/protection: Surgical    Comment: hyst  Lifestyle  . Physical activity:    Days per week: Not on file    Minutes per session: Not on file  . Stress: Not on file  Relationships  . Social connections:    Talks on phone: Not on file    Gets together: Not on file    Attends religious  service: Not on file    Active member of club or organization: Not on file    Attends meetings of clubs or organizations: Not on file    Relationship status: Not on file  Other Topics Concern  . Not on file  Social History Narrative  . Not on file     Family History:  Family History  Problem Relation Age of Onset  . Hypertension Mother   . Heart disease Father   . Healthy Sister   . Depression Brother   . Healthy Sister   . Diabetes Paternal Grandmother    ROS:   Please see the history of present illness. Chronically sleeps elevated even since 170lb All other systems are reviewed and otherwise negative.    PHYSICAL EXAM:   VS:  BP 126/66   Pulse 95   Ht 5\' 2"  (1.575 m)   Wt 279 lb 1.9 oz (126.6 kg)   SpO2 97%   BMI 51.05 kg/m   BMI: Body mass index is 51.05 kg/m. GEN: Well nourished, well developed morbidly obese WF in no acute distress  HEENT: normocephalic, atraumatic Neck: no JVD, carotid bruits, or masses Cardiac: RRR; no murmurs, rubs, or gallops, no edema  Respiratory:  clear to auscultation bilaterally, normal work of breathing GI: soft, nontender, nondistended, + BS MS: no deformity or atrophy  Skin: warm and dry, no rash Neuro:  Alert and Oriented x 3, Strength  and sensation are intact, follows commands Psych: euthymic mood, full affect  Wt Readings from Last 3 Encounters:  09/18/17 279 lb 1.9 oz (126.6 kg)  08/23/17 280 lb (127 kg)  08/16/17 280 lb (127 kg)      Studies/Labs Reviewed:   EKG:  EKG was ordered today and personally reviewed by me and demonstrates NSR 78bpm, left axis deviation, poor R wave progression, nonspecific TW changes no change from prior.  Recent Labs: 05/26/2017: BUN 14; Creatinine, Ser 0.80; Hemoglobin 13.6; Magnesium 2.0; NT-Pro BNP 117; Platelets 358; Potassium 4.6; Sodium 142; TSH 1.600   Lipid Panel No results found for: CHOL, TRIG, HDL, CHOLHDL, VLDL, LDLCALC, LDLDIRECT  Additional studies/ records that were reviewed today include: Summarized above.    ASSESSMENT & PLAN:   1. Dyspnea on exertion - suspected due to deconditioning and exaggerated HR response to activity. Recent BNP wnl. D-dimer was marginally elevated but normal when adjusted for age (<0.58), and picture doesn't really fit the chronicity for VTE. Nuclear stress test was low risk and EF is normal. Her CXR showed calcified mediastinal lymph nodes. She recalls seeing Dr. Elsworth Soho many years ago and was told perhaps she had restrictive lung disease but she does not wish to see him again. She is interested in pulmonology evaluation which I think would be helpful given her recent progression in symptoms and the transient drop in O2 last visit. We again emphasized importance of regular physical activity. Will also follow dyspnea response to med titration as below. 2. PACs/PVCS/PAT - she remains terrified of titrating Toprol dose as was present in prior notes even to when Dr. Tamala Julian saw the patient. She wants to try a different approach. Will decrease Toprol to 25mg  daily (keeping it on board, as it still had some benefit when she initially started it) and add Diltiazem CD 120mg  daily. May need EP input if she remains symptomatic. 3. Chronic diastolic CHF -  appears euvolemic on exam. Lungs clear, no effusion or vascular congestion on CXR, BNP normal, and no edema. 4. Essential HTN - controlled,  follow with med changes. 5. Aortic atherosclerosis - discussed importance of following with primary care to make sure goal LDL is <70. If not, I told her they may need to consider addition of Zetia.  Disposition: F/u with Dr. Anise Salvo in 3 months. I will also send chart to Dr. Radford Pax for review to see if there is anything else she would suggest.   Medication Adjustments/Labs and Tests Ordered: Current medicines are reviewed at length with the patient today.  Concerns regarding medicines are outlined above. Medication changes, Labs and Tests ordered today are summarized above and listed in the Patient Instructions accessible in Encounters.   Signed, Charlie Pitter, PA-C  09/18/2017 4:10 PM    [facility] [place], [place], [place]  [address] Phone: [phone]; Fax: [phone]

## 2017-09-18 NOTE — Patient Instructions (Signed)
Your physician has recommended you make the following change in your medication:  1.) stop the Toprol XL 50 mg 2.) continue Toprol XL 25 mg once a day 3.) start diltiazem CD 120 once a day  You have been referred to Pulmonary- Dr. Lake Bells   Your physician recommends that you schedule a follow-up appointment in: 3 months with Melina Copa, PA-C.

## 2017-09-19 ENCOUNTER — Other Ambulatory Visit: Payer: Self-pay

## 2017-09-19 ENCOUNTER — Telehealth: Payer: Self-pay | Admitting: Student

## 2017-09-19 DIAGNOSIS — R0609 Other forms of dyspnea: Principal | ICD-10-CM

## 2017-09-19 DIAGNOSIS — R0600 Dyspnea, unspecified: Secondary | ICD-10-CM

## 2017-09-19 NOTE — Telephone Encounter (Signed)
Spoke to patient and gave her Dr Theodosia Blender recommendations.. She verbalized understanding.Marland Kitchen

## 2017-09-19 NOTE — Telephone Encounter (Signed)
Lpmtcb 4/2 md

## 2017-09-19 NOTE — Telephone Encounter (Signed)
   Covering for Dayna - she had reached out to Dr. Radford Pax to see if she had any further recommendations regarding testing for the patient's persistent dyspnea on exertion.  Dr. Radford Pax has recommended a cardiopulmonary stress test. It was also recommended she follow-up with Pulmonology again for possible restrictive lung disease.   Please contact the patient with these recommendations. Thank you for your help.   Signed, Erma Heritage, PA-C 09/19/2017, 12:52 PM

## 2017-09-21 ENCOUNTER — Telehealth (HOSPITAL_COMMUNITY): Payer: Self-pay | Admitting: *Deleted

## 2017-09-21 NOTE — Telephone Encounter (Signed)
Patient was contacted per request from [facility] at [facility] for Dr. Theodosia Blender patient to have CPX scheduled. Patient was unavailable but LMTCB to schedule appointment.    Landis Martins, MS, ACSM-RCEP 09/21/2017 3:58 PM

## 2017-09-24 ENCOUNTER — Other Ambulatory Visit: Payer: Self-pay | Admitting: Endocrinology

## 2017-09-24 NOTE — Telephone Encounter (Signed)
Please refill x 1 Ov is due  

## 2017-09-25 ENCOUNTER — Telehealth (HOSPITAL_COMMUNITY): Payer: Self-pay | Admitting: *Deleted

## 2017-09-25 ENCOUNTER — Other Ambulatory Visit: Payer: Self-pay

## 2017-09-25 MED ORDER — METFORMIN HCL ER 500 MG PO TB24: ORAL_TABLET | ORAL | 0 refills | Status: DC

## 2017-09-25 NOTE — Telephone Encounter (Signed)
Returned patient call to schedule CPX. Patient was unavailable. LMTCB.    Landis Martins, MS, ACSM-RCEP 09/25/2017 8:48 AM

## 2017-10-11 ENCOUNTER — Ambulatory Visit (HOSPITAL_COMMUNITY): Payer: BLUE CROSS/BLUE SHIELD | Attending: Internal Medicine

## 2017-10-11 DIAGNOSIS — R0600 Dyspnea, unspecified: Secondary | ICD-10-CM

## 2017-10-11 DIAGNOSIS — R0609 Other forms of dyspnea: Secondary | ICD-10-CM | POA: Insufficient documentation

## 2017-10-17 ENCOUNTER — Other Ambulatory Visit: Payer: Self-pay | Admitting: Family Medicine

## 2017-10-17 DIAGNOSIS — Z1231 Encounter for screening mammogram for malignant neoplasm of breast: Secondary | ICD-10-CM

## 2017-11-21 DIAGNOSIS — E1165 Type 2 diabetes mellitus with hyperglycemia: Secondary | ICD-10-CM | POA: Diagnosis not present

## 2017-11-21 DIAGNOSIS — E538 Deficiency of other specified B group vitamins: Secondary | ICD-10-CM | POA: Diagnosis not present

## 2017-11-21 DIAGNOSIS — M2550 Pain in unspecified joint: Secondary | ICD-10-CM | POA: Diagnosis not present

## 2017-11-21 DIAGNOSIS — Z79899 Other long term (current) drug therapy: Secondary | ICD-10-CM | POA: Diagnosis not present

## 2017-11-22 ENCOUNTER — Encounter: Payer: Self-pay | Admitting: Pulmonary Disease

## 2017-11-22 ENCOUNTER — Ambulatory Visit (INDEPENDENT_AMBULATORY_CARE_PROVIDER_SITE_OTHER): Payer: BLUE CROSS/BLUE SHIELD | Admitting: Pulmonary Disease

## 2017-11-22 VITALS — BP 140/78 | HR 93 | Ht 62.0 in | Wt 276.0 lb

## 2017-11-22 DIAGNOSIS — J849 Interstitial pulmonary disease, unspecified: Secondary | ICD-10-CM

## 2017-11-22 DIAGNOSIS — J984 Other disorders of lung: Secondary | ICD-10-CM | POA: Diagnosis not present

## 2017-11-22 DIAGNOSIS — R0600 Dyspnea, unspecified: Secondary | ICD-10-CM

## 2017-11-22 DIAGNOSIS — Q676 Pectus excavatum: Secondary | ICD-10-CM | POA: Diagnosis not present

## 2017-11-22 NOTE — Progress Notes (Signed)
Synopsis: Referred in June 2019 for dyspnea in the setting of OSA, diastolic heart failure.  Subjective:   PATIENT ID: Jaime Nichols GENDER: female DOB: 03-28-1961, MRN: [id number]   HPI  Chief Complaint  Patient presents with  . New Consult    dyspnea on exertion    Jaime Nichols is here to see me because she can't "breathe laying down flat" > she says that she recalls having dyspnea at age 57 when she weighed only 112 pounds > she had a hard time filling the dummy with air > at age 57 she weighed around 120 pounds she moved to [place] and she really had a hard time breathing > she thinks that the humidity makes her breathe worse > at age 18 she and her husband tried to go hiking and she couldn't breathe carrying a backpack > she has pectus excavatum recognized age 57 > by her mid thirties she caught "bronchiolitis" which she called "death in a can" because she felt like she was going to die; she weighed 150 pounds at the time.  She had severe coughing fits in the evenings.  She was treated with prednisone, inhaled bronchodilators and inhaled corticosteroids.  After she recovered from the bronchiolitis she realized that she couldn't sleep laying flat > in her 30's she fell and had a severe injury to her left foot and couldn't exercise much, since then she has struggled with severe left foot pain and hasn't exercised and has gained over 100 pounds.   > she doesn't cough much and she doesn't get sick too often > she has used Xopenex of an on for a few years when she gets colds that help her with preventing chest symptoms.  > she doesn't get bad colds > she can't lay without dyspnea > she can't walk too far because her knee pain is pretty bad > if her heart isn't bothering her too much she can load the dishwasher and do activities in the house without too much difficulty  She has struggled with heart rhythm abnormalities.  When she was in cardiology she had a walking oximetry  test and she was told that her oxygen dropped.    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 Aortic atherosclerosis ([place])   . Chronic diastolic CHF (congestive heart failure) ([place])   . Diabetes mellitus type 2 in obese ([place])   . Fibroid   . HTN (hypertension)   . Hyperlipidemia   . Morbid obesity ([place])   . OSA on CPAP   . PAT (paroxysmal atrial tachycardia) ([place])   . Premature atrial contractions   . PVC's (premature ventricular contractions)      Family History  Problem Relation Age of Onset  . Hypertension Mother   . Heart disease Father   . Healthy Sister   . Depression Brother   . Healthy Sister   . Diabetes Paternal Grandmother      Social History   Socioeconomic History  . Marital status: Married    Spouse name: Not on file  . Number of children: Not on file  . Years of education: Not on file  . Highest education level: Not on file  Occupational History  . Not on file  Social Needs  . Financial  resource strain: Not on file  . Food insecurity:    Worry: Not on file    Inability: Not on file  . Transportation needs:    Medical: Not on file    Non-medical: Not on file  Tobacco Use  . Smoking status: Never Smoker  . Smokeless tobacco: Never Used  Substance and Sexual Activity  . Alcohol use: Yes    Comment: occ.  . Drug use: No  . Sexual activity: Yes    Partners: Male    Birth control/protection: Surgical    Comment: hyst  Lifestyle  . Physical activity:    Days per week: Not on file    Minutes per session: Not on file  . Stress: Not on file  Relationships  . Social connections:    Talks on phone: Not on file    Gets together: Not on file    Attends religious service: Not on file    Active member of club or organization: Not on  file    Attends meetings of clubs or organizations: Not on file    Relationship status: Not on file  . Intimate partner violence:    Fear of current or ex partner: Not on file    Emotionally abused: Not on file    Physically abused: Not on file    Forced sexual activity: Not on file  Other Topics Concern  . Not on file  Social History Narrative  . Not on file     Allergies  Allergen Reactions  . Latex Other (See Comments)    REACTION: "CONTACT DERMATITIS"  . Erythromycin Diarrhea  . Statins Other (See Comments)     Outpatient Medications Prior to Visit  Medication Sig Dispense Refill  . CLINPRO 5000 1.1 % PSTE Take 1 application by mouth 2 (two) times daily. Reported on 08/11/2015  10  . diltiazem (CARDIZEM CD) 120 MG 24 hr capsule Take 1 capsule (120 mg total) by mouth daily. 30 capsule 3  . glimepiride (AMARYL) 4 MG tablet TAKE 1 TABLET BY MOUTH EVERY DAY BEFORE BREAKFAST 90 tablet 0  . lisinopril-hydrochlorothiazide (PRINZIDE,ZESTORETIC) 10-12.5 MG per tablet Take 1 tablet by mouth daily.     . metFORMIN (GLUCOPHAGE-XR) 500 MG 24 hr tablet TAKE 2 TABLETS BY MOUTH TWICE A DAY WITH MEALS 360 tablet 0  . metoprolol succinate (TOPROL XL) 25 MG 24 hr tablet Take 1 tablet (25 mg total) by mouth daily. 90 tablet 3  . rosuvastatin (CRESTOR) 20 MG tablet Take 20 mg by mouth every other day.   1  . sitaGLIPtin (JANUVIA) 100 MG tablet Take 1 tablet (100 mg total) by mouth daily. 90 tablet 0   No facility-administered medications prior to visit.     Review of Systems  Constitutional: Negative for fever and weight loss.  HENT: Negative for congestion, ear pain, nosebleeds and sore throat.   Eyes: Negative for redness.  Respiratory: Positive for shortness of breath. Negative for wheezing.   Cardiovascular: Positive for palpitations and leg swelling.  Gastrointestinal: Negative for nausea and vomiting.  Genitourinary: Negative for dysuria.  Musculoskeletal: Positive for joint pain.    Skin: Negative for rash.  Neurological: Negative for headaches.  Endo/Heme/Allergies: Does not bruise/bleed easily.  Psychiatric/Behavioral: Negative for depression. The patient is not nervous/anxious.       Objective:  Physical Exam   Vitals:   11/22/17 1054  BP: 140/78  Pulse: 93  SpO2: 94%  Weight: 276 lb (125.2 kg)  Height: 5\' 2"  (1.575 m)  RA  Gen: morbidly obese but well appearing, no acute distress HENT: NCAT, OP clear, neck supple without masses Eyes: PERRL, EOMi Lymph: no cervical lymphadenopathy PULM: CTA B CV: RRR, no mgr, no JVD GI: BS+, soft, nontender, no hsm Derm: trace ankle edema no rash or skin breakdown MSK: normal bulk and tone Neuro: A&Ox4, CN II-XII intact, MAEW Psyche: normal mood and affect   CBC    Component Value Date/Time   WBC 10.3 05/26/2017 1130   RBC 4.82 05/26/2017 1130   HGB 13.6 05/26/2017 1130   HCT 41.6 05/26/2017 1130   PLT 358 05/26/2017 1130   MCV 86 05/26/2017 1130   MCH 28.2 05/26/2017 1130   MCHC 32.7 05/26/2017 1130   RDW 14.5 05/26/2017 1130     Chest imaging: 2010 CT chest > personally reviewed showing calcified lymph nodes, no pulmonary parenchymal abnormality 07/2017 CXR > calcified mediastinal lymph nodes, cardiomegally  PFT:  Cardiopulmonary stress test 09/2017 : Spirometry: Ratio 73%, FVC 1.48 L (49% pred), MVV 57 l/min (65%); stopped due to leg fatigue BP increased from 409 systolic to 811; VO2 max 91.4 ml/kg; at peak exercise ventilation reached 91% of ventilatory reserve.  O2 saturation dropped from 97% at the beginning of exercise to 91% on exertion.  Labs:  Path:  Echo: 06/2017 LEVF 60-65% Grade 2 diastolid dysfunction, mild LVH, LAE (mild), PA pressure 32 mmHg  Heart Catheterization:      Assessment & Plan:   Restrictive lung disease - Plan: Pulmonary function test  Interstitial pulmonary disease ([place]) - Plan: CT Chest High Resolution  Dyspnea, unspecified type  Pectus  excavatum  Discussion: This is a pleasant 57 year old female with significant obesity and a history of pectus excavatum and comes to my clinic today for evaluation of dyspnea.  I think the most likely cause is going to be restrictive lung disease secondary to the above listed causes.  However, she did have a significant episode of bronchiolitis in her 104s and she describes significant trouble laying flat so I worry about bronchomalacia.  I think the best approach moving forward is to get a lung function test and a high-resolution CT scan of her chest to assess further.  I am also concerned about the fact that her O2 saturation will drop from 97% to 91% when she exercises, so I think there is value in getting a CT scan of the chest to ensure there is no evidence of a pulmonary parenchymal disease.  By my interpretation of the raw data from her exercise stress test it looks like she is ventilatory limited and that she reached 91% of her ventilatory reserve when she stopped.  She actually had pretty decent aerobic capacity.  The calcified mediastinal lymph nodes in her chest likely represent old histoplasmosis exposure.  Plan: For shortness of breath: We will check a lung function test We are going to check a high-resolution CT scan of the chest to make sure there is no evidence of an underlying pulmonary parenchymal disease  For being overweight: The following behaviors have been associated with weight loss: Weigh yourself daily Write down everything you eat Drink a glass of water prior to eating a meal Only eat when you are hungry Buy food from the periphery of the grocery store, not the middle  We will see you back in about 2 weeks ago over there is results    Current Outpatient Medications:  .  CLINPRO 5000 1.1 % PSTE, Take 1 application by mouth 2 (two) times  daily. Reported on 08/11/2015, Disp: , Rfl: 10 .  diltiazem (CARDIZEM CD) 120 MG 24 hr capsule, Take 1 capsule (120 mg total) by  mouth daily., Disp: 30 capsule, Rfl: 3 .  glimepiride (AMARYL) 4 MG tablet, TAKE 1 TABLET BY MOUTH EVERY DAY BEFORE BREAKFAST, Disp: 90 tablet, Rfl: 0 .  lisinopril-hydrochlorothiazide (PRINZIDE,ZESTORETIC) 10-12.5 MG per tablet, Take 1 tablet by mouth daily. , Disp: , Rfl:  .  metFORMIN (GLUCOPHAGE-XR) 500 MG 24 hr tablet, TAKE 2 TABLETS BY MOUTH TWICE A DAY WITH MEALS, Disp: 360 tablet, Rfl: 0 .  metoprolol succinate (TOPROL XL) 25 MG 24 hr tablet, Take 1 tablet (25 mg total) by mouth daily., Disp: 90 tablet, Rfl: 3 .  rosuvastatin (CRESTOR) 20 MG tablet, Take 20 mg by mouth every other day. , Disp: , Rfl: 1 .  sitaGLIPtin (JANUVIA) 100 MG tablet, Take 1 tablet (100 mg total) by mouth daily., Disp: 90 tablet, Rfl: 0

## 2017-11-22 NOTE — Patient Instructions (Signed)
For shortness of breath: We will check a lung function test We are going to check a high-resolution CT scan of the chest to make sure there is no evidence of an underlying pulmonary parenchymal disease  For being overweight: The following behaviors have been associated with weight loss: Weigh yourself daily Write down everything you eat Drink a glass of water prior to eating a meal Only eat when you are hungry Buy food from the periphery of the grocery store, not the middle  We will see you back in about 2 weeks ago over there is results

## 2017-11-23 ENCOUNTER — Ambulatory Visit
Admission: RE | Admit: 2017-11-23 | Discharge: 2017-11-23 | Disposition: A | Discharge status: Home / Self Care | Payer: BLUE CROSS/BLUE SHIELD | Source: Ambulatory Visit | Attending: Family Medicine | Admitting: Family Medicine

## 2017-11-23 DIAGNOSIS — Z1231 Encounter for screening mammogram for malignant neoplasm of breast: Secondary | ICD-10-CM | POA: Diagnosis not present

## 2017-11-28 ENCOUNTER — Telehealth: Payer: Self-pay | Admitting: Pulmonary Disease

## 2017-11-28 NOTE — Telephone Encounter (Signed)
Called and spoke with patient. She states that she called the wrong doctors office. Nothing further needed.

## 2017-12-01 ENCOUNTER — Encounter: Payer: Self-pay | Admitting: Cardiology

## 2017-12-06 ENCOUNTER — Ambulatory Visit (INDEPENDENT_AMBULATORY_CARE_PROVIDER_SITE_OTHER)
Admission: RE | Admit: 2017-12-06 | Discharge: 2017-12-06 | Disposition: A | Discharge status: Home / Self Care | Payer: BLUE CROSS/BLUE SHIELD | Source: Ambulatory Visit | Attending: Pulmonary Disease | Admitting: Pulmonary Disease

## 2017-12-06 DIAGNOSIS — R0602 Shortness of breath: Secondary | ICD-10-CM | POA: Diagnosis not present

## 2017-12-06 DIAGNOSIS — R300 Dysuria: Secondary | ICD-10-CM | POA: Diagnosis not present

## 2017-12-06 DIAGNOSIS — J849 Interstitial pulmonary disease, unspecified: Secondary | ICD-10-CM

## 2017-12-11 DIAGNOSIS — R300 Dysuria: Secondary | ICD-10-CM | POA: Diagnosis not present

## 2017-12-11 DIAGNOSIS — R399 Unspecified symptoms and signs involving the genitourinary system: Secondary | ICD-10-CM | POA: Diagnosis not present

## 2017-12-11 DIAGNOSIS — R319 Hematuria, unspecified: Secondary | ICD-10-CM | POA: Diagnosis not present

## 2017-12-13 ENCOUNTER — Telehealth: Payer: Self-pay | Admitting: Pulmonary Disease

## 2017-12-13 NOTE — Telephone Encounter (Signed)
Called and spoke with pt regarding conerns with CT orders. Pt has HRCT chest for BQ and a CT scan of bladder with her urologist. Pt was wanting to know if these were separate tests or one test. Advised pt that these are two separate orders, two separate tests. Pt verbalized understanding, had no further questions. Nothing further needed at this time.

## 2017-12-13 NOTE — Progress Notes (Signed)
Cardiology Office Note    Date:  12/14/2017  ID:  Jaime Nichols, DOB May 06, 1961, MRN [id number] PCP:  Leighton Ruff, MD  Cardiologist:  Fransico Him, MD   Chief Complaint: f/u palpitations and dyspnea  History of Present Illness:  Jaime Nichols ([place]) is a 57 y.o. female with history of OSA, morbid obesity, HTN, chronic diastolic CHF, DM, nonsustained atrial tach, PACs, PVCs who presents for f/u of palpitations (PVCs, atrial tach) and dyspnea.  She has a longstanding history of dyspnea with exertion, even back to her 61s when she was a normal weight. She recalls being SOB even taking a CPR class. She remembers a stress test from many years ago when she weighed 150lb where she had an exaggerated HR response with exercise. She was told it was due to deconditioning. She fell off a ladder many years ago and attributes substantial weight gain to that event. She also has 2 arthritic knees which need replacing, but has been told this cannot occur until she loses some weight. She has not had the motivation to do so thus far. She was seen in clinic 05/2017 for palpitations and dyspnea with sensation of heart racing in her throat. Labs showed normal TSH, CBC, Mg, BNP. 2D Echo 06/2017 showed EF 60-65%, grade 2 DD, high ventricular filling pressure, mild LAE, PASP 66mmHg. Event monitor showed NSR/sinus tach with average HR 87bpm, occasional PVCs, bigeminal and trigeminal, and nonsustained atrial tach. Dr. Radford Pax recommended to increase her Toprol to 50mg  daily. Subsequent f/u 24 hour monitor which showed average HR 84 (58->126 range), occasional PVCs with load 1.28%, and occasional PACs in form of singles, couplets, and triplets.   When seen back in follow-up 07/2017, she continued to notice sensation of heart racing and dyspnea with minimal activity. She did report compliance with CPAP. At that visit, she ambulated and her HR went up to 129 and pulse ox dropped transiently to 88% but  then came up to 93-96% on rest. It took a while for her HR to recover back to baseline to the 80s-90s. D-dimer was minimally up at 0.53 which, when adjusted for age criteria, was negative (<0.58). Nuclear stress test 08/23/17 was low risk with fixed small mild apical perfusion defect - given normal wall motion, suspect attenuation, no evidence for ischemia, EF 56%. CXR showed borderline cardiomegaly, aortic atherosclerosis, calcified mediastinal lymph nodes, clear lungs. It was recommended she titrate Toprol but she declined as she was terrified to do so. We instead kept Toprol at 25mg  daily and added diltiazem. She was extensively counseled on dire need for weight loss. She was referred for CPX and pulmonology evaluation, who noted episode of broncholitis in her 32s. CPX showed normal functional capacity with significant limitation due to severe obesity and related restrictive properties.  She had an exaggerated heart rate and hypertensive response to exercise with mild oxygen desaturations to a low of 91% at peak exercise. High resolution CT scan 12/06/17 showed no evidence of interstitial lung disease, but did show enlarged pulmonary arteries for which pulm planned to see back in follow-up regarding. PFTs pending.  She returns for follow-up today. Diltiazem significantly improved her palpitations. Endo recently changed her DM regimen to Trulicity. She recently purchased a treadmill but it is too big so she is trying to sell it and get another one. Dyspnea on exertion is relatively unchanged. No chest pain. Has been watching salt intake.  Past Medical History:  Diagnosis Date  . Aortic atherosclerosis ([place])   .  Chronic diastolic CHF (congestive heart failure) ([place])   . Diabetes mellitus type 2 in obese ([place])   . Fibroid   . HTN (hypertension)   . Hyperlipidemia   . Morbid obesity ([place])   . OSA on CPAP   . PAT (paroxysmal atrial tachycardia) ([place])   . Premature atrial contractions   . PVC's (premature  ventricular contractions)     Past Surgical History:  Procedure Laterality Date  . ABDOMINAL HYSTERECTOMY  2010   BSO  . Benign uterine polyps  01/2009  . DILATION AND CURETTAGE OF UTERUS    . HYSTEROSCOPY      Current Medications: Current Meds  Medication Sig  . ciprofloxacin (CIPRO) 500 MG tablet Take 500 mg by mouth 2 (two) times daily. Take 500 mg by mouth two times daily for five days.  Marland Kitchen CLINPRO 5000 1.1 % PSTE Take 1 application by mouth 2 (two) times daily. Reported on 08/11/2015  . diltiazem (CARDIZEM CD) 120 MG 24 hr capsule Take 1 capsule (120 mg total) by mouth daily.  . Dulaglutide (TRULICITY [place]) Inject into the skin. Inject into the skin once weekly.  Marland Kitchen glimepiride (AMARYL) 4 MG tablet TAKE 1 TABLET BY MOUTH EVERY DAY BEFORE BREAKFAST  . lisinopril-hydrochlorothiazide (PRINZIDE,ZESTORETIC) 10-12.5 MG per tablet Take 1 tablet by mouth daily.   . metFORMIN (GLUCOPHAGE-XR) 500 MG 24 hr tablet TAKE 2 TABLETS BY MOUTH TWICE A DAY WITH MEALS  . metoprolol succinate (TOPROL XL) 25 MG 24 hr tablet Take 1 tablet (25 mg total) by mouth daily.  . rosuvastatin (CRESTOR) 20 MG tablet Take 20 mg by mouth every other day.   . vitamin B-12 (CYANOCOBALAMIN) 1000 MCG tablet Take 1,000 mcg by mouth daily. Take 1,000 mcg sublingual once daily.    Allergies:   Latex; Erythromycin; and Statins   Social History   Socioeconomic History  . Marital status: Married    Spouse name: Not on file  . Number of children: Not on file  . Years of education: Not on file  . Highest education level: Not on file  Occupational History  . Not on file  Social Needs  . Financial resource strain: Not on file  . Food insecurity:    Worry: Not on file    Inability: Not on file  . Transportation needs:    Medical: Not on file    Non-medical: Not on file  Tobacco Use  . Smoking status: Never Smoker  . Smokeless tobacco: Never Used  Substance and Sexual Activity  . Alcohol use: Yes    Comment: occ.    . Drug use: No  . Sexual activity: Yes    Partners: Male    Birth control/protection: Surgical    Comment: hyst  Lifestyle  . Physical activity:    Days per week: Not on file    Minutes per session: Not on file  . Stress: Not on file  Relationships  . Social connections:    Talks on phone: Not on file    Gets together: Not on file    Attends religious service: Not on file    Active member of club or organization: Not on file    Attends meetings of clubs or organizations: Not on file    Relationship status: Not on file  Other Topics Concern  . Not on file  Social History Narrative  . Not on file     Family History:  The patient's family history includes Depression in her brother; Diabetes in her  paternal grandmother; Healthy in her sister and sister; Heart disease in her father; Hypertension in her mother.  Her dad died in his 79s of a cardiac event. They were told it was atherosclerosis, but he apparently also had PVCs. Her sister has PVCs and a more distant relative has WPW.   ROS:   Please see the history of present illness. Otherwise, review of systems is positive for increased LEE that I cannot appreciate by exam today. All other systems are reviewed and otherwise negative.    PHYSICAL EXAM:   VS:  BP 120/60   Pulse 88   Ht 5\' 2"  (1.575 m)   Wt 275 lb 12.8 oz (125.1 kg)   BMI 50.44 kg/m   BMI: Body mass index is 50.44 kg/m. GEN: Well nourished, well developed morbidly obese WF, in no acute distress HEENT: normocephalic, atraumatic Neck: no JVD, carotid bruits, or masses Cardiac: RRR; no murmurs, rubs, or gallops, no edema  Respiratory:  clear to auscultation bilaterally, normal work of breathing GI: soft, nontender, nondistended, + BS MS: no deformity or atrophy Skin: warm and dry, no rash Neuro:  Alert and Oriented x 3, Strength and sensation are intact, follows commands Psych: euthymic mood, full affect  Wt Readings from Last 3 Encounters:  12/14/17 275  lb 12.8 oz (125.1 kg)  11/22/17 276 lb (125.2 kg)  09/18/17 279 lb 1.9 oz (126.6 kg)      Studies/Labs Reviewed:   EKG:  EKG was not ordered today  Recent Labs: 05/26/2017: BUN 14; Creatinine, Ser 0.80; Hemoglobin 13.6; Magnesium 2.0; NT-Pro BNP 117; Platelets 358; Potassium 4.6; Sodium 142; TSH 1.600   Lipid Panel No results found for: CHOL, TRIG, HDL, CHOLHDL, VLDL, LDLCALC, LDLDIRECT  Additional studies/ records that were reviewed today include: Summarized above.   ASSESSMENT & PLAN:   1. Dyspnea on exertion with history of desaturation with exercise - extensive w/u as above. Appreciate thorough pulm input to help discern etiology. Continue pulm workup as planned. High res CT raised question of PA enlargement but recent echo showed normal sized PA and normal PASP. Await PFTs. Continued weight loss encouraged. Ultimately if no specific pulm etiology is found could consider definitive R/LHC although the chronicity of her dyspnea goes back decades.  2. Atrial tach, PACs, PVCs - much improved with combo of diltiazem and Toprol - continue. 3. Possible pulmonary artery enlargement - as above. Await pulm input. 4. Chronic diastolic CHF - she reports since starting diltiazem she's had intermittent edema of lower extremities. This is not significant on exam today but could be a potential side effect. However since she was helped by diltiazem, will continue. Advised sodium restriction and elevation of lower extremities. Weight is actually down a few lb so does not appear volume overloaded on exam today.  Disposition: F/u with Dr. Radford Pax in 6 months.   Medication Adjustments/Labs and Tests Ordered: Current medicines are reviewed at length with the patient today.  Concerns regarding medicines are outlined above. Medication changes, Labs and Tests ordered today are summarized above and listed in the Patient Instructions accessible in Encounters.   Signed, Charlie Pitter, PA-C  12/14/2017 10:29  AM    [address], [place], [place]  [address] Phone: [phone]; Fax: [phone]

## 2017-12-14 ENCOUNTER — Encounter: Payer: Self-pay | Admitting: Physician Assistant

## 2017-12-14 ENCOUNTER — Ambulatory Visit (INDEPENDENT_AMBULATORY_CARE_PROVIDER_SITE_OTHER): Payer: BLUE CROSS/BLUE SHIELD | Admitting: Physician Assistant

## 2017-12-14 VITALS — BP 120/60 | HR 88 | Ht 62.0 in | Wt 275.8 lb

## 2017-12-14 DIAGNOSIS — I5032 Chronic diastolic (congestive) heart failure: Secondary | ICD-10-CM

## 2017-12-14 DIAGNOSIS — I493 Ventricular premature depolarization: Secondary | ICD-10-CM | POA: Diagnosis not present

## 2017-12-14 DIAGNOSIS — I491 Atrial premature depolarization: Secondary | ICD-10-CM | POA: Diagnosis not present

## 2017-12-14 DIAGNOSIS — R0609 Other forms of dyspnea: Secondary | ICD-10-CM

## 2017-12-14 DIAGNOSIS — I288 Other diseases of pulmonary vessels: Secondary | ICD-10-CM | POA: Diagnosis not present

## 2017-12-14 DIAGNOSIS — I471 Supraventricular tachycardia: Secondary | ICD-10-CM | POA: Diagnosis not present

## 2017-12-14 DIAGNOSIS — R0600 Dyspnea, unspecified: Secondary | ICD-10-CM

## 2017-12-14 NOTE — Patient Instructions (Signed)
Medication Instructions:  Your physician recommends that you continue on your current medications as directed. Please refer to the Current Medication list given to you today.   Labwork: None ordered  Testing/Procedures: None ordered  Follow-Up: Your physician recommends that you schedule a follow-up appointment in: [place]   Any Other Special Instructions Will Be Listed Below (If Applicable).     If you need a refill on your cardiac medications before your next appointment, please call your pharmacy.

## 2017-12-18 DIAGNOSIS — E119 Type 2 diabetes mellitus without complications: Secondary | ICD-10-CM | POA: Diagnosis not present

## 2017-12-19 DIAGNOSIS — K5790 Diverticulosis of intestine, part unspecified, without perforation or abscess without bleeding: Secondary | ICD-10-CM | POA: Diagnosis not present

## 2017-12-19 DIAGNOSIS — R109 Unspecified abdominal pain: Secondary | ICD-10-CM | POA: Diagnosis not present

## 2017-12-19 DIAGNOSIS — N200 Calculus of kidney: Secondary | ICD-10-CM | POA: Diagnosis not present

## 2017-12-19 DIAGNOSIS — K760 Fatty (change of) liver, not elsewhere classified: Secondary | ICD-10-CM | POA: Diagnosis not present

## 2017-12-22 ENCOUNTER — Other Ambulatory Visit: Payer: Self-pay | Admitting: Physician Assistant

## 2017-12-27 DIAGNOSIS — E1165 Type 2 diabetes mellitus with hyperglycemia: Secondary | ICD-10-CM | POA: Diagnosis not present

## 2017-12-27 DIAGNOSIS — E782 Mixed hyperlipidemia: Secondary | ICD-10-CM | POA: Diagnosis not present

## 2017-12-27 DIAGNOSIS — E559 Vitamin D deficiency, unspecified: Secondary | ICD-10-CM | POA: Diagnosis not present

## 2017-12-27 DIAGNOSIS — I10 Essential (primary) hypertension: Secondary | ICD-10-CM | POA: Diagnosis not present

## 2017-12-28 ENCOUNTER — Ambulatory Visit (INDEPENDENT_AMBULATORY_CARE_PROVIDER_SITE_OTHER): Payer: BLUE CROSS/BLUE SHIELD | Admitting: Pulmonary Disease

## 2017-12-28 ENCOUNTER — Encounter: Payer: Self-pay | Admitting: Pulmonary Disease

## 2017-12-28 VITALS — BP 130/78 | HR 110 | Ht 63.0 in | Wt 274.0 lb

## 2017-12-28 DIAGNOSIS — J4530 Mild persistent asthma, uncomplicated: Secondary | ICD-10-CM | POA: Diagnosis not present

## 2017-12-28 DIAGNOSIS — R0600 Dyspnea, unspecified: Secondary | ICD-10-CM

## 2017-12-28 DIAGNOSIS — J984 Other disorders of lung: Secondary | ICD-10-CM

## 2017-12-28 LAB — PULMONARY FUNCTION TEST
DL/VA % pred: 143 %
DL/VA: 6.7 ml/min/mmHg/L
DLCO unc % pred: 90 %
DLCO unc: 20.77 ml/min/mmHg
FEF 25-75 PRE: 0.78 L/s
FEF 25-75 Post: 1.04 L/sec
FEF2575-%CHANGE-POST: 33 %
FEF2575-%PRED-PRE: 33 %
FEF2575-%Pred-Post: 44 %
FEV1-%Change-Post: 9 %
FEV1-%PRED-POST: 51 %
FEV1-%Pred-Pre: 46 %
FEV1-POST: 1.29 L
FEV1-PRE: 1.17 L
FEV1FVC-%Change-Post: 5 %
FEV1FVC-%Pred-Pre: 90 %
FEV6-%CHANGE-POST: 3 %
FEV6-%PRED-POST: 55 %
FEV6-%PRED-PRE: 53 %
FEV6-PRE: 1.66 L
FEV6-Post: 1.72 L
FEV6FVC-%CHANGE-POST: 0 %
FEV6FVC-%PRED-POST: 102 %
FEV6FVC-%PRED-PRE: 103 %
FVC-%CHANGE-POST: 3 %
FVC-%Pred-Post: 53 %
FVC-%Pred-Pre: 51 %
FVC-Post: 1.73 L
FVC-Pre: 1.66 L
POST FEV6/FVC RATIO: 100 %
PRE FEV1/FVC RATIO: 71 %
Post FEV1/FVC ratio: 75 %
Pre FEV6/FVC Ratio: 100 %
RV % PRED: 93 %
RV: 1.79 L
TLC % pred: 71 %
TLC: 3.49 L

## 2017-12-28 MED ORDER — FLUTICASONE FUROATE-VILANTEROL 200-25 MCG/INH IN AEPB: 1.0000 | INHALATION_SPRAY | Freq: Every day | RESPIRATORY_TRACT | 0 refills | Status: DC

## 2017-12-28 NOTE — Patient Instructions (Signed)
Asthma: Try taking Breo 1 puff daily no matter how you feel Call me after using the sample of Breo for a week and let me know if you think it is helpful, if so we can call in a prescription Follow-up with [place] in 4 to 6 weeks Use albuterol as needed for chest tightness wheezing or shortness of breath [place]  Shortness of breath: As I explained today I think this is a complex problem due to your weight as well as possibly asthma. I think you need to continue efforts to lose weight  I like for you to come back and follow-up with [place] in 4 to 6 weeks to discuss whether or not the bronchodilator helped.  If it did we can talk about options for using bronchodilators in the future.

## 2017-12-28 NOTE — Progress Notes (Signed)
Synopsis: Referred in June 2019 for dyspnea in the setting of OSA, diastolic heart failure.  Subjective:   PATIENT ID: Jaime Nichols GENDER: female DOB: [birth year], MRN: [id number]   HPI  Chief Complaint  Patient presents with  . Follow-up    PFT today, questions about CT scan, still SOB, still unable to breathe laying flat   Jaime Nichols is here to go over her lung function testing today.  She says there has not been any interval changes since the last visit.  She still has some trouble with breathing when she exerts herself but there is no significant interval change.  No bronchitis or pneumonia since the last visit.    Past Medical History:  Diagnosis Date  . Aortic atherosclerosis ([place])   . Chronic diastolic CHF (congestive heart failure) ([place])   . Diabetes mellitus type 2 in obese ([place])   . Fibroid   . HTN (hypertension)   . Hyperlipidemia   . Morbid obesity ([place])   . OSA on CPAP   . PAT (paroxysmal atrial tachycardia) ([place])   . Premature atrial contractions   . PVC's (premature ventricular contractions)       Review of Systems  Constitutional: Negative for fever and weight loss.  HENT: Negative for congestion, ear pain, nosebleeds and sore throat.   Eyes: Negative for redness.  Respiratory: Positive for shortness of breath. Negative for wheezing.   Cardiovascular: Positive for palpitations and leg swelling.  Gastrointestinal: Negative for nausea and vomiting.  Genitourinary: Negative for dysuria.  Musculoskeletal: Positive for joint pain.  Skin: Negative for rash.  Neurological: Negative for headaches.  Endo/Heme/Allergies: Does not bruise/bleed easily.  Psychiatric/Behavioral: Negative for depression. The patient is not nervous/anxious.       Objective:  Physical Exam   Vitals:   12/28/17 1209  BP: 130/78  Pulse: (!) 110  SpO2: 93%  Weight: 274 lb (124.3 kg)  Height: 5\' 3"  (1.6 m)    RA  Gen: obese but well appearing HENT: OP clear, TM's  clear, neck supple PULM: CTA B, normal percussion CV: RRR, no mgr, trace edema GI: BS+, soft, nontender Derm: no cyanosis or rash Psyche: normal mood and affect  CBC    Component Value Date/Time   WBC 10.3 05/26/2017 1130   RBC 4.82 05/26/2017 1130   HGB 13.6 05/26/2017 1130   HCT 41.6 05/26/2017 1130   PLT 358 05/26/2017 1130   MCV 86 05/26/2017 1130   MCH 28.2 05/26/2017 1130   MCHC 32.7 05/26/2017 1130   RDW 14.5 05/26/2017 1130     Chest imaging: 2010 CT chest > personally reviewed showing calcified lymph nodes, no pulmonary parenchymal abnormality 07/2017 CXR > calcified mediastinal lymph nodes, cardiomegally June 2019 high-resolution CT scanning of the chest showed some atelectasis, findings worrisome for pulmonary arterial hypertension, air trapping noted on expiratory images  PFT:   Cardiopulmonary stress test 09/2017 : Spirometry: Ratio 73%, FVC 1.48 L (49% pred), MVV 57 l/min (65%); stopped due to leg fatigue BP increased from 765 systolic to 465; VO2 max 03.5 ml/kg; at peak exercise ventilation reached 91% of ventilatory reserve.  O2 saturation dropped from 97% at the beginning of exercise to 91% on exertion.  Labs:  Path:  Echo: 06/2017 LEVF 60-65% Grade 2 diastolid dysfunction, mild LVH, LAE (mild), PA pressure 32 mmHg  Heart Catheterization:      Assessment & Plan:   Restrictive lung disease  Dyspnea, unspecified type  Mild persistent asthma without complication  Discussion: Jaime Nichols comes  to clinic today to ask me about pulmonary causes of shortness of breath.  She has restrictive lung disease which is mild secondary to her obesity and she does have small airways obstruction which was reversible with albuterol.  So I think she may have some degree of asthma but I think most of the problem is that she is overweight.  Though there was findings worrisome for increased pulmonary pressure on the CT scan of her chest the diffusion capacity on her lung function  testing was within normal limits so I do not think there is pulmonary arteriopathy.  Plan: Asthma: Try taking Breo 1 puff daily no matter how you feel Call me after using the sample of Breo for a week and let me know if you think it is helpful, if so we can call in a prescription Follow-up with [place] in 4 to 6 weeks Use albuterol as needed for chest tightness wheezing or shortness of breath Exline  Shortness of breath: As I explained today I think this is a complex problem due to your weight as well as possibly asthma. I think you need to continue efforts to lose weight  I like for you to come back and follow-up with [place] in 4 to 6 weeks to discuss whether or not the bronchodilator helped.  If it did we can talk about options for using bronchodilators in the future.  > 50% of this 27 minute visit spent face to face   Current Outpatient Medications:  .  CLINPRO 5000 1.1 % PSTE, Take 1 application by mouth 2 (two) times daily. Reported on 08/11/2015, Disp: , Rfl: 10 .  diltiazem (CARDIZEM CD) 120 MG 24 hr capsule, TAKE 1 CAPSULE BY MOUTH EVERY DAY, Disp: 90 capsule, Rfl: 3 .  Dulaglutide (TRULICITY [place]), Inject into the skin. Inject into the skin once weekly., Disp: , Rfl:  .  glimepiride (AMARYL) 4 MG tablet, TAKE 1 TABLET BY MOUTH EVERY DAY BEFORE BREAKFAST, Disp: 90 tablet, Rfl: 0 .  lisinopril-hydrochlorothiazide (PRINZIDE,ZESTORETIC) 10-12.5 MG per tablet, Take 1 tablet by mouth daily. , Disp: , Rfl:  .  metFORMIN (GLUCOPHAGE-XR) 500 MG 24 hr tablet, TAKE 2 TABLETS BY MOUTH TWICE A DAY WITH MEALS, Disp: 360 tablet, Rfl: 0 .  metoprolol succinate (TOPROL XL) 25 MG 24 hr tablet, Take 1 tablet (25 mg total) by mouth daily., Disp: 90 tablet, Rfl: 3 .  rosuvastatin (CRESTOR) 20 MG tablet, Take 20 mg by mouth every other day. , Disp: , Rfl: 1 .  vitamin B-12 (CYANOCOBALAMIN) 1000 MCG tablet, Take 1,000 mcg by mouth daily. Take 1,000 mcg sublingual once daily., Disp: , Rfl:  .  fluticasone  furoate-vilanterol (BREO ELLIPTA) 200-25 MCG/INH AEPB, Inhale 1 puff into the lungs daily., Disp: 1 each, Rfl: 0

## 2017-12-28 NOTE — Progress Notes (Signed)
PFT done today. 

## 2017-12-29 ENCOUNTER — Other Ambulatory Visit: Payer: Self-pay | Admitting: Endocrinology

## 2017-12-29 DIAGNOSIS — E538 Deficiency of other specified B group vitamins: Secondary | ICD-10-CM | POA: Diagnosis not present

## 2018-01-01 DIAGNOSIS — E538 Deficiency of other specified B group vitamins: Secondary | ICD-10-CM | POA: Diagnosis not present

## 2018-01-01 DIAGNOSIS — E559 Vitamin D deficiency, unspecified: Secondary | ICD-10-CM | POA: Diagnosis not present

## 2018-01-01 DIAGNOSIS — E782 Mixed hyperlipidemia: Secondary | ICD-10-CM | POA: Diagnosis not present

## 2018-01-04 DIAGNOSIS — Z713 Dietary counseling and surveillance: Secondary | ICD-10-CM | POA: Diagnosis not present

## 2018-01-11 DIAGNOSIS — N390 Urinary tract infection, site not specified: Secondary | ICD-10-CM | POA: Diagnosis not present

## 2018-01-11 DIAGNOSIS — R300 Dysuria: Secondary | ICD-10-CM | POA: Diagnosis not present

## 2018-01-16 ENCOUNTER — Telehealth: Payer: Self-pay | Admitting: Pulmonary Disease

## 2018-01-16 NOTE — Telephone Encounter (Signed)
Called pt who stated she was started on Trulicity by Dr. Buddy Duty and she stated she is not due to come back until Sept 1.  Due to the new med she was started on, she is going to hold taking the [facility] until she goes back to see Dr. Buddy Duty due to the [facility] having a steroid in it.  Pt states she is not having any current problems with her breathing but wanted to make sure it would be okay for her to hold off on the [place] with her being on the new med.  Dr. Lake Bells, please advise on this for pt. Thanks!

## 2018-01-17 NOTE — Telephone Encounter (Signed)
OK to hold

## 2018-01-18 NOTE — Telephone Encounter (Signed)
LMTCB

## 2018-01-18 NOTE — Telephone Encounter (Signed)
Spoke with the pt and notified of recs per BQ  She verbalized understanding

## 2018-01-26 DIAGNOSIS — R399 Unspecified symptoms and signs involving the genitourinary system: Secondary | ICD-10-CM | POA: Diagnosis not present

## 2018-01-26 DIAGNOSIS — R319 Hematuria, unspecified: Secondary | ICD-10-CM | POA: Diagnosis not present

## 2018-01-30 DIAGNOSIS — E539 Vitamin B deficiency, unspecified: Secondary | ICD-10-CM | POA: Diagnosis not present

## 2018-02-08 ENCOUNTER — Ambulatory Visit: Payer: BLUE CROSS/BLUE SHIELD | Admitting: Pulmonary Disease

## 2018-02-22 DIAGNOSIS — Z5181 Encounter for therapeutic drug level monitoring: Secondary | ICD-10-CM | POA: Diagnosis not present

## 2018-02-22 DIAGNOSIS — E538 Deficiency of other specified B group vitamins: Secondary | ICD-10-CM | POA: Diagnosis not present

## 2018-02-22 DIAGNOSIS — E119 Type 2 diabetes mellitus without complications: Secondary | ICD-10-CM | POA: Diagnosis not present

## 2018-03-03 ENCOUNTER — Other Ambulatory Visit: Payer: Self-pay | Admitting: Endocrinology

## 2018-03-07 ENCOUNTER — Ambulatory Visit: Payer: BLUE CROSS/BLUE SHIELD | Admitting: Pulmonary Disease

## 2018-03-28 ENCOUNTER — Encounter: Payer: Self-pay | Admitting: Pulmonary Disease

## 2018-03-28 ENCOUNTER — Ambulatory Visit (INDEPENDENT_AMBULATORY_CARE_PROVIDER_SITE_OTHER): Payer: BLUE CROSS/BLUE SHIELD | Admitting: Pulmonary Disease

## 2018-03-28 DIAGNOSIS — R0600 Dyspnea, unspecified: Secondary | ICD-10-CM | POA: Diagnosis not present

## 2018-03-28 DIAGNOSIS — J984 Other disorders of lung: Secondary | ICD-10-CM

## 2018-03-28 NOTE — Progress Notes (Signed)
I saw and examined Jaime Nichols today independently.  She reports ongoing dyspnea, no problems with cough.  She says that she took the [place] and it made her incontinence worse.  She said that it did not affect her breathing for the 2 weeks that she was taking the medicine.  She had notes some chronic rib pain bilaterally which is been there for 20 years.  She is considering gastric bypass or bariatric surgery.  On exam: [place] lungs are clear to auscultation no wheezing, cardiovascular regular rate and rhythm no murmurs gallops rubs  Asthma: This is a mild problem and was not alleviated by Breo.  In general I think there is very little evidence that she has significant asthma or that it is contributing to her shortness of breath.  If she had asthma the [place] should have helped her feel less short of breath.  Dyspnea: As described multiple times in the past this is due to restrictive lung disease from obesity and deconditioning.  I strongly encouraged her to consider gastric bypass or bariatric surgery of some kind today.  Greater than 50% of this 26-minute visit spent face-to-face

## 2018-03-28 NOTE — Progress Notes (Signed)
Synopsis: Referred in June 2019 for dyspnea in the setting of OSA, diastolic heart failure.  Subjective:   PATIENT ID: Jaime Nichols GENDER: female DOB: [birth year], MRN: [id number]   HPI  Chief Complaint  Patient presents with  . Follow-up    doing "about the same" - Tried the [facility] but didn't see much difference in breathing but made her bladder symptoms worse.   OV 03/28/18 - Follow up Patient presents for a follow up. She was prescribed Breo at last visit. She tried it for 2 weeks, but states that she stopped it due to increased bladder incontinence. She states that she still has some trouble with breathing with exertion, but no significant interval change. She has started the process/classes for bariatric surgery.  She also complains of some intermittent bilateral rib pain that has been ongoing for the past 20 years. She denies any injury or fall. She denies any fever, chest pain, or edema.   Past Medical History:  Diagnosis Date  . Aortic atherosclerosis ([place])   . Chronic diastolic CHF (congestive heart failure) ([place])   . Diabetes mellitus type 2 in obese ([place])   . Fibroid   . HTN (hypertension)   . Hyperlipidemia   . Morbid obesity ([place])   . OSA on CPAP   . PAT (paroxysmal atrial tachycardia) ([place])   . Premature atrial contractions   . PVC's (premature ventricular contractions)       Review of Systems  Constitutional: Negative for fever and weight loss.  HENT: Negative for congestion, ear pain, nosebleeds and sore throat.   Eyes: Negative for redness.  Respiratory: Negative for shortness of breath and wheezing.   Cardiovascular: Negative for chest pain, palpitations and leg swelling.  Gastrointestinal: Negative for nausea and vomiting.  Genitourinary: Negative for dysuria.  Skin: Negative for rash.  Neurological: Negative for headaches.  Endo/Heme/Allergies: Does not bruise/bleed easily.  Psychiatric/Behavioral: Negative for depression. The patient is not  nervous/anxious.       Objective:  Physical Exam  Constitutional: She is oriented to person, place, and time. She appears well-developed and well-nourished. No distress.  Cardiovascular: Normal rate and regular rhythm.  Pulmonary/Chest: Effort normal and breath sounds normal. No respiratory distress.  Musculoskeletal: She exhibits no edema.  Neurological: She is alert and oriented to person, place, and time.  Psychiatric: She has a normal mood and affect.  Nursing note and vitals reviewed.    Vitals:   03/28/18 1601  BP: (!) 144/80  Pulse: 97  SpO2: 97%  Weight: 275 lb (124.7 kg)  Height: 5\' 2"  (1.575 m)     Chest imaging: 2010 CT chest > personally reviewed showing calcified lymph nodes, no pulmonary parenchymal abnormality 07/2017 CXR > calcified mediastinal lymph nodes, cardiomegally June 2019 high-resolution CT scanning of the chest showed some atelectasis, findings worrisome for pulmonary arterial hypertension, air trapping noted on expiratory images  PFT:   Cardiopulmonary stress test 09/2017 : Spirometry: Ratio 73%, FVC 1.48 L (49% pred), MVV 57 l/min (65%); stopped due to leg fatigue BP increased from 027 systolic to 253; VO2 max 66.4 ml/kg; at peak exercise ventilation reached 91% of ventilatory reserve.  O2 saturation dropped from 97% at the beginning of exercise to 91% on exertion.  Echo: 06/2017 LEVF 60-65% Grade 2 diastolid dysfunction, mild LVH, LAE (mild), PA pressure 32 mmHg        Assessment & Plan:   Morbid obesity ([place])  Discussion: Asthma: Refused Breo Use albuterol as needed for chest tightness wheezing  or shortness of breath Exline  Shortness of breath: As I explained today I think this is a complex problem due to your weight as well as possibly asthma. I think you need to continue efforts to lose weight/bariatric surgery  Current Outpatient Medications:  .  CLINPRO 5000 1.1 % PSTE, Take 1 application by mouth 2 (two) times daily.  Reported on 08/11/2015, Disp: , Rfl: 10 .  diltiazem (CARDIZEM CD) 120 MG 24 hr capsule, TAKE 1 CAPSULE BY MOUTH EVERY DAY, Disp: 90 capsule, Rfl: 3 .  Dulaglutide (TRULICITY [place]), Inject into the skin. Inject into the skin once weekly., Disp: , Rfl:  .  glimepiride (AMARYL) 4 MG tablet, TAKE 1 TABLET BY MOUTH EVERY DAY BEFORE BREAKFAST, Disp: 90 tablet, Rfl: 0 .  lisinopril-hydrochlorothiazide (PRINZIDE,ZESTORETIC) 10-12.5 MG per tablet, Take 1 tablet by mouth daily. , Disp: , Rfl:  .  metFORMIN (GLUCOPHAGE-XR) 500 MG 24 hr tablet, TAKE 2 TABLETS BY MOUTH TWICE A DAY WITH MEALS, Disp: 360 tablet, Rfl: 0 .  metoprolol succinate (TOPROL XL) 25 MG 24 hr tablet, Take 1 tablet (25 mg total) by mouth daily., Disp: 90 tablet, Rfl: 3 .  vitamin B-12 (CYANOCOBALAMIN) 1000 MCG tablet, Take 1,000 mcg by mouth daily. Take 1,000 mcg sublingual once daily., Disp: , Rfl:  .  fluticasone furoate-vilanterol (BREO ELLIPTA) 200-25 MCG/INH AEPB, Inhale 1 puff into the lungs daily. (Patient not taking: Reported on 03/28/2018), Disp: 1 each, Rfl: 0 .  rosuvastatin (CRESTOR) 20 MG tablet, Take 20 mg by mouth every other day. , Disp: , Rfl: 1

## 2018-03-28 NOTE — Patient Instructions (Signed)
Continue current medications Continue process for bariatric surgery Stay active Follow up with Dr. Lake Bells as needed

## 2018-04-13 DIAGNOSIS — E538 Deficiency of other specified B group vitamins: Secondary | ICD-10-CM | POA: Diagnosis not present

## 2018-05-02 DIAGNOSIS — S80819A Abrasion, unspecified lower leg, initial encounter: Secondary | ICD-10-CM | POA: Diagnosis not present

## 2018-05-15 ENCOUNTER — Encounter: Payer: BLUE CROSS/BLUE SHIELD | Admitting: Women's Health

## 2018-05-15 DIAGNOSIS — E538 Deficiency of other specified B group vitamins: Secondary | ICD-10-CM | POA: Diagnosis not present

## 2018-05-24 ENCOUNTER — Ambulatory Visit (INDEPENDENT_AMBULATORY_CARE_PROVIDER_SITE_OTHER): Payer: BLUE CROSS/BLUE SHIELD | Admitting: Cardiology

## 2018-05-24 ENCOUNTER — Encounter: Payer: Self-pay | Admitting: Cardiology

## 2018-05-24 VITALS — BP 141/84 | HR 99 | Ht 62.0 in | Wt 275.0 lb

## 2018-05-24 DIAGNOSIS — G4733 Obstructive sleep apnea (adult) (pediatric): Secondary | ICD-10-CM | POA: Diagnosis not present

## 2018-05-24 DIAGNOSIS — Z9989 Dependence on other enabling machines and devices: Secondary | ICD-10-CM

## 2018-05-24 DIAGNOSIS — I5032 Chronic diastolic (congestive) heart failure: Secondary | ICD-10-CM

## 2018-05-24 DIAGNOSIS — I471 Supraventricular tachycardia: Secondary | ICD-10-CM

## 2018-05-24 DIAGNOSIS — E7800 Pure hypercholesterolemia, unspecified: Secondary | ICD-10-CM

## 2018-05-24 DIAGNOSIS — I10 Essential (primary) hypertension: Secondary | ICD-10-CM

## 2018-05-24 DIAGNOSIS — E785 Hyperlipidemia, unspecified: Secondary | ICD-10-CM

## 2018-05-24 NOTE — Progress Notes (Signed)
Cardiology Office Note:    Date:  05/24/2018   ID:  Jaime Nichols, DOB July 09, 1960, MRN [id number]  PCP:  Leighton Ruff, MD  Cardiologist:  Fransico Him, MD    Referring MD: Leighton Ruff, MD   Chief Complaint  Patient presents with  . Sleep Apnea  . Hypertension  . Congestive Heart Failure    History of Present Illness:    Jaime Nichols is a 57 y.o. female with a hx of OSA, obesity, chronic diastolic CHF, PAT and HTN.  She is here today for followup and is doing well.  She denies any chest pain or pressure, PND, orthopnea, LE edema, dizziness, palpitations or syncope. She has chronic SOB that is multifactorial from obesity, pectus excavatum and possible asthma, followed by Dr. Milton Ferguson.  She is compliant with her meds and is tolerating meds with no SE.  She is doing well with her CPAP device.  She tolerates the mask and feels the pressure is adequate.  Since going on CPAP she feels rested in the am and has no significant daytime sleepiness.  She denies any significant mouth or nasal dryness or nasal congestion.  She does not think that he snores.     Past Medical History:  Diagnosis Date  . Aortic atherosclerosis ([place])   . Chronic diastolic CHF (congestive heart failure) ([place])   . Diabetes mellitus type 2 in obese ([place])   . Fibroid   . HTN (hypertension)   . Hyperlipidemia   . Morbid obesity ([place])   . OSA on CPAP   . PAT (paroxysmal atrial tachycardia) ([place])   . Premature atrial contractions   . PVC's (premature ventricular contractions)     Past Surgical History:  Procedure Laterality Date  . ABDOMINAL HYSTERECTOMY  2010   BSO  . Benign uterine polyps  01/2009  . DILATION AND CURETTAGE OF UTERUS    . HYSTEROSCOPY      Current Medications: Current Meds  Medication Sig  . CLINPRO 5000 1.1 % PSTE Take 1 application by mouth 2 (two) times daily. Reported on 08/11/2015  . diltiazem (CARDIZEM CD) 120 MG 24 hr capsule TAKE 1 CAPSULE BY MOUTH EVERY DAY    . Dulaglutide (TRULICITY [place]) Inject into the skin. Inject into the skin once weekly.  [place] Kitchen glimepiride (AMARYL) 4 MG tablet TAKE 1 TABLET BY MOUTH EVERY DAY BEFORE BREAKFAST  . lisinopril-hydrochlorothiazide (PRINZIDE,ZESTORETIC) 10-12.5 MG per tablet Take 1 tablet by mouth daily.   . metFORMIN (GLUCOPHAGE-XR) 500 MG 24 hr tablet TAKE 2 TABLETS BY MOUTH TWICE A DAY WITH MEALS  . metoprolol succinate (TOPROL XL) 25 MG 24 hr tablet Take 1 tablet (25 mg total) by mouth daily.  . vitamin B-12 (CYANOCOBALAMIN) 1000 MCG tablet Take 1,000 mcg by mouth daily. Take 1,000 mcg sublingual once daily.     Allergies:   Azithromycin; Latex; Statins; and Erythromycin   Social History   Socioeconomic History  . Marital status: Married    Spouse name: Not on file  . Number of children: Not on file  . Years of education: Not on file  . Highest education level: Not on file  Occupational History  . Not on file  Social Needs  . Financial resource strain: Not on file  . Food insecurity:    Worry: Not on file    Inability: Not on file  . Transportation needs:    Medical: Not on file    Non-medical: Not on file  Tobacco Use  . Smoking status:  Never Smoker  . Smokeless tobacco: Never Used  Substance and Sexual Activity  . Alcohol use: Yes    Comment: occ.  . Drug use: No  . Sexual activity: Yes    Partners: Male    Birth control/protection: Surgical    Comment: hyst  Lifestyle  . Physical activity:    Days per week: Not on file    Minutes per session: Not on file  . Stress: Not on file  Relationships  . Social connections:    Talks on phone: Not on file    Gets together: Not on file    Attends religious service: Not on file    Active member of club or organization: Not on file    Attends meetings of clubs or organizations: Not on file    Relationship status: Not on file  Other Topics Concern  . Not on file  Social History Narrative  . Not on file     Family History: The patient's  family history includes Arrhythmia in her other and sister; Depression in her brother; Diabetes in her paternal grandmother; Healthy in her sister and sister; Heart disease in her father; Hypertension in her mother.  ROS:   Please see the history of present illness.    ROS  All other systems reviewed and negative.   EKGs/Labs/Other Studies Reviewed:    The following studies were reviewed today: PAP download  EKG:  EKG is not ordered today.   Recent Labs: 05/26/2017: BUN 14; Creatinine, Ser 0.80; Hemoglobin 13.6; Magnesium 2.0; NT-Pro BNP 117; Platelets 358; Potassium 4.6; Sodium 142; TSH 1.600   Recent Lipid Panel No results found for: CHOL, TRIG, HDL, CHOLHDL, VLDL, LDLCALC, LDLDIRECT  Physical Exam:    VS:  BP (!) 141/84   Pulse 99   Ht 5\' 2"  (1.575 m)   Wt 275 lb (124.7 kg)   BMI 50.30 kg/m     Wt Readings from Last 3 Encounters:  05/24/18 275 lb (124.7 kg)  03/28/18 275 lb (124.7 kg)  12/28/17 274 lb (124.3 kg)     GEN:  Well nourished, well developed in no acute distress HEENT: Normal NECK: No JVD; No carotid bruits LYMPHATICS: No lymphadenopathy CARDIAC: RRR, no murmurs, rubs, gallops RESPIRATORY:  Clear to auscultation without rales, wheezing or rhonchi  ABDOMEN: Soft, non-tender, non-distended MUSCULOSKELETAL:  No edema; No deformity  SKIN: Warm and dry NEUROLOGIC:  Alert and oriented x 3 PSYCHIATRIC:  Normal affect   ASSESSMENT:    1. Chronic diastolic CHF (congestive heart failure) ([place])   2. Essential hypertension   3. PAT (paroxysmal atrial tachycardia) ([place])   4. OSA on CPAP   5. Morbid obesity ([place])    PLAN:    In order of problems listed above:  1.  Chronic diastolic CHF - she appears euvolemic on exam today and her weight is stable.  She will continue on diuretic.    2.  HTN - BP is controlled on exam today.  She will continue on Cardizem CD 120mg  daily, Lisinopril HCT 10-12.5mg  daily and Toprol XL 25mg  daily.  Her creatinine was stable at  0.72 and K+ 4 on 01/01/2018.    3.  PAT - she denies any palpitations.  She will continue on Cardizem and Toprol.   4.  OSA - the patient is tolerating PAP therapy well without any problems. The PAP download was reviewed today and showed an AHI of 0.6/hr on 12.6 cm H2O with 100% compliance in using more than 4 hours  nightly.  The patient has been using and benefiting from PAP use and will continue to benefit from therapy.   5.  Morbid obesity - I have encouraged her to get into a routine exercise program and cut back on carbs and portions.      Medication Adjustments/Labs and Tests Ordered: Current medicines are reviewed at length with the patient today.  Concerns regarding medicines are outlined above.  No orders of the defined types were placed in this encounter.  No orders of the defined types were placed in this encounter.   Signed, Fransico Him, MD  05/24/2018 1:27 PM    [facility]

## 2018-05-24 NOTE — Addendum Note (Signed)
Addended by: Sarina Ill on: 05/24/2018 01:45 PM   Modules accepted: Orders

## 2018-05-24 NOTE — Patient Instructions (Signed)
Medication Instructions:  Your physician recommends that you continue on your current medications as directed. Please refer to the Current Medication list given to you today.  If you need a refill on your cardiac medications before your next appointment, please call your pharmacy.   Lab work:  If you have labs (blood work) drawn today and your tests are completely normal, you will receive your results only by: . MyChart Message (if you have MyChart) OR . A paper copy in the mail If you have any lab test that is abnormal or we need to change your treatment, we will call you to review the results.  Follow-Up: At [facility], you and your health needs are our priority.  As part of our continuing mission to provide you with exceptional heart care, we have created designated Provider Care Teams.  These Care Teams include your primary Cardiologist (physician) and Advanced Practice Providers (APPs -  Physician Assistants and Nurse Practitioners) who all work together to provide you with the care you need, when you need it. You will need a follow up appointment in 1 years.  Please call our office 2 months in advance to schedule this appointment.  You may see Traci Turner, MD or one of the following Advanced Practice Providers on your designated Care Team:   Brittainy Simmons, PA-C Dayna Dunn, PA-C . Michele Lenze, PA-C    

## 2018-06-11 DIAGNOSIS — L821 Other seborrheic keratosis: Secondary | ICD-10-CM | POA: Diagnosis not present

## 2018-06-11 DIAGNOSIS — L853 Xerosis cutis: Secondary | ICD-10-CM | POA: Diagnosis not present

## 2018-06-11 DIAGNOSIS — D1801 Hemangioma of skin and subcutaneous tissue: Secondary | ICD-10-CM | POA: Diagnosis not present

## 2018-06-11 DIAGNOSIS — D2261 Melanocytic nevi of right upper limb, including shoulder: Secondary | ICD-10-CM | POA: Diagnosis not present

## 2018-06-18 DIAGNOSIS — E538 Deficiency of other specified B group vitamins: Secondary | ICD-10-CM | POA: Diagnosis not present

## 2018-06-25 DIAGNOSIS — Z79899 Other long term (current) drug therapy: Secondary | ICD-10-CM | POA: Diagnosis not present

## 2018-06-25 DIAGNOSIS — E119 Type 2 diabetes mellitus without complications: Secondary | ICD-10-CM | POA: Diagnosis not present

## 2018-06-25 DIAGNOSIS — Z8639 Personal history of other endocrine, nutritional and metabolic disease: Secondary | ICD-10-CM | POA: Diagnosis not present

## 2018-06-28 ENCOUNTER — Other Ambulatory Visit: Payer: BLUE CROSS/BLUE SHIELD | Admitting: *Deleted

## 2018-06-28 DIAGNOSIS — E785 Hyperlipidemia, unspecified: Secondary | ICD-10-CM

## 2018-06-28 DIAGNOSIS — E7800 Pure hypercholesterolemia, unspecified: Secondary | ICD-10-CM | POA: Diagnosis not present

## 2018-06-28 LAB — LIPID PANEL
CHOLESTEROL TOTAL: 217 mg/dL — AB (ref 100–199)
Chol/HDL Ratio: 5.4 ratio — ABNORMAL HIGH (ref 0.0–4.4)
HDL: 40 mg/dL (ref >39)
LDL CALC: 140 mg/dL — AB (ref 0–99)
TRIGLYCERIDES: 185 mg/dL — AB (ref 0–149)
VLDL Cholesterol Cal: 37 mg/dL (ref 5–40)

## 2018-06-28 LAB — HEPATIC FUNCTION PANEL
ALBUMIN: 4.4 g/dL (ref 3.5–5.5)
ALK PHOS: 93 IU/L (ref 39–117)
ALT: 42 IU/L — ABNORMAL HIGH (ref 0–32)
AST: 27 IU/L (ref 0–40)
Bilirubin Total: 0.3 mg/dL (ref 0.0–1.2)
Bilirubin, Direct: 0.11 mg/dL (ref 0.00–0.40)
Total Protein: 7.2 g/dL (ref 6.0–8.5)

## 2018-06-29 ENCOUNTER — Other Ambulatory Visit: Payer: Self-pay | Admitting: Endocrinology

## 2018-07-04 NOTE — Progress Notes (Signed)
She can start zetia or you can refer her to the lipid clinic

## 2018-07-09 ENCOUNTER — Telehealth: Payer: Self-pay | Admitting: Cardiology

## 2018-07-09 DIAGNOSIS — E7800 Pure hypercholesterolemia, unspecified: Secondary | ICD-10-CM

## 2018-07-09 NOTE — Telephone Encounter (Signed)
New Message   Patients states that Dr. Radford Pax was to reach back to her in reference to what cholesterol medication she recommends. She is wondering is it Zetia. Please call to discuss

## 2018-07-09 NOTE — Telephone Encounter (Signed)
Sent result note to Dr. Radford Pax. Waiting on her response.

## 2018-07-11 NOTE — Telephone Encounter (Signed)
Notes recorded by Sueanne Margarita, MD on 07/09/2018 at 7:05 PM EST She will need a Chest CT for calcium score and if calcium score high enough she may qualify for PCSK9i otherwise would try Zetia. Please order CHest CT calcium score

## 2018-07-11 NOTE — Telephone Encounter (Signed)
Spoke with the patient, she wanted to get a calcium score and understood about the out of pocket cost. Sending message to [facility].

## 2018-07-27 ENCOUNTER — Ambulatory Visit (INDEPENDENT_AMBULATORY_CARE_PROVIDER_SITE_OTHER)
Admission: RE | Admit: 2018-07-27 | Discharge: 2018-07-27 | Disposition: A | Discharge status: Home / Self Care | Payer: Self-pay | Source: Ambulatory Visit | Attending: Cardiology | Admitting: Cardiology

## 2018-07-27 DIAGNOSIS — E7800 Pure hypercholesterolemia, unspecified: Secondary | ICD-10-CM

## 2018-07-30 ENCOUNTER — Encounter: Payer: Self-pay | Admitting: Cardiology

## 2018-07-30 DIAGNOSIS — I2510 Atherosclerotic heart disease of native coronary artery without angina pectoris: Secondary | ICD-10-CM | POA: Insufficient documentation

## 2018-07-30 DIAGNOSIS — R3129 Other microscopic hematuria: Secondary | ICD-10-CM | POA: Diagnosis not present

## 2018-07-31 ENCOUNTER — Telehealth: Payer: Self-pay

## 2018-07-31 DIAGNOSIS — R931 Abnormal findings on diagnostic imaging of heart and coronary circulation: Secondary | ICD-10-CM

## 2018-07-31 DIAGNOSIS — E785 Hyperlipidemia, unspecified: Secondary | ICD-10-CM

## 2018-07-31 DIAGNOSIS — M1711 Unilateral primary osteoarthritis, right knee: Secondary | ICD-10-CM | POA: Diagnosis not present

## 2018-07-31 DIAGNOSIS — M25561 Pain in right knee: Secondary | ICD-10-CM | POA: Diagnosis not present

## 2018-07-31 DIAGNOSIS — I2510 Atherosclerotic heart disease of native coronary artery without angina pectoris: Secondary | ICD-10-CM

## 2018-07-31 DIAGNOSIS — E7800 Pure hypercholesterolemia, unspecified: Secondary | ICD-10-CM

## 2018-07-31 DIAGNOSIS — K7460 Unspecified cirrhosis of liver: Secondary | ICD-10-CM

## 2018-07-31 NOTE — Telephone Encounter (Signed)
Left a message to call back.

## 2018-07-31 NOTE — Telephone Encounter (Signed)
-----   Message from Sueanne Margarita, MD sent at 07/30/2018 10:45 PM EST ----- Please let patient know that patient that CT showed fatty liver with enlarged lateral segment of left liver lobe which can be seen in early cirrhosis from fatty liver.  Please refer to [facility] for further evaluation.  Coronary calcium score is very high.  Please get a coronary CTA with morphology and FFR to assess further.  Please refer to lipid clinic for PCSK9i as she is statin intolerant and also start ASA 81mg  daily.

## 2018-08-01 MED ORDER — METOPROLOL TARTRATE 100 MG PO TABS: ORAL_TABLET | ORAL | 0 refills | Status: DC

## 2018-08-01 NOTE — Telephone Encounter (Signed)
Spoke with the patient, she expressed understanding about her test results and accepted having a Cardiac CT, referral to lipid clinic and [facility]. The patient expressed understanding about the Cardiac CT instructions and a copy was sent to her home. The patient stated that in the past she had taken ASA 81 mg, however, she had to stop since it upset her Interstitial cystitis. The patient wanted to know if she should have something else since ASA caused her problems in the past. Sending to Dr. Radford Pax for recommendations.

## 2018-08-01 NOTE — Telephone Encounter (Signed)
Follow up  ° ° °Patient is returning your call. °

## 2018-08-02 NOTE — Telephone Encounter (Signed)
Nothing else for now I place of ASA - will wait for CT

## 2018-08-06 NOTE — Telephone Encounter (Signed)
Needs to see PCP for workup of chronic inflammation

## 2018-08-06 NOTE — Telephone Encounter (Signed)
Spoke with the patient, stated ASA 81 mg has been fine so far. I advised the patient to let our office know if she develops cystitis. The patient also stated that her joints are still hurting and see was seen at [facility] by Gerrit Halls PA-C and he stated she might be experiencing chronic inflammation. He stated that she would need lab work such as ANA, sed rate and CRP. The patient wanted to know if we could drawn the labs instead of PCP due to everyone being sick at PCP.

## 2018-08-06 NOTE — Telephone Encounter (Signed)
Sending patient a MyChart message.

## 2018-08-13 ENCOUNTER — Other Ambulatory Visit: Payer: BLUE CROSS/BLUE SHIELD

## 2018-08-13 DIAGNOSIS — R931 Abnormal findings on diagnostic imaging of heart and coronary circulation: Secondary | ICD-10-CM

## 2018-08-13 DIAGNOSIS — I2510 Atherosclerotic heart disease of native coronary artery without angina pectoris: Secondary | ICD-10-CM | POA: Diagnosis not present

## 2018-08-13 LAB — BASIC METABOLIC PANEL
BUN/Creatinine Ratio: 15 (ref 9–23)
BUN: 12 mg/dL (ref 6–24)
CHLORIDE: 98 mmol/L (ref 96–106)
CO2: 23 mmol/L (ref 20–29)
Calcium: 9.5 mg/dL (ref 8.7–10.2)
Creatinine, Ser: 0.81 mg/dL (ref 0.57–1.00)
GFR calc Af Amer: 92 mL/min/{1.73_m2} (ref >59)
GFR calc non Af Amer: 80 mL/min/{1.73_m2} (ref >59)
Glucose: 210 mg/dL — ABNORMAL HIGH (ref 65–99)
Potassium: 4.2 mmol/L (ref 3.5–5.2)
Sodium: 139 mmol/L (ref 134–144)

## 2018-08-17 DIAGNOSIS — H2513 Age-related nuclear cataract, bilateral: Secondary | ICD-10-CM | POA: Diagnosis not present

## 2018-08-17 DIAGNOSIS — H5213 Myopia, bilateral: Secondary | ICD-10-CM | POA: Diagnosis not present

## 2018-08-17 DIAGNOSIS — E119 Type 2 diabetes mellitus without complications: Secondary | ICD-10-CM | POA: Diagnosis not present

## 2018-08-22 ENCOUNTER — Ambulatory Visit (INDEPENDENT_AMBULATORY_CARE_PROVIDER_SITE_OTHER): Payer: BLUE CROSS/BLUE SHIELD | Admitting: Pharmacist

## 2018-08-22 ENCOUNTER — Telehealth (HOSPITAL_COMMUNITY): Payer: Self-pay | Admitting: Emergency Medicine

## 2018-08-22 DIAGNOSIS — E785 Hyperlipidemia, unspecified: Secondary | ICD-10-CM

## 2018-08-22 NOTE — Telephone Encounter (Signed)
Reaching out to patient to offer assistance regarding upcoming cardiac imaging study; pt verbalizes understanding of appt date/time, parking situation and where to check in, pre-test NPO status and medications ordered, and verified current allergies; name and call back number provided for further questions should they arise Lacy Taglieri RN Navigator Cardiac Imaging [facility] [phone] office [phone] cell 

## 2018-08-22 NOTE — Progress Notes (Addendum)
Patient ID: Jaime Nichols                 DOB: [birth year]                    MRN: [id number]     HPI: Jaime Nichols is a 58 y.o. female patient referred to lipid clinic by Dr. Radford Nichols. PMH is significant for OSA, obesity, E8BT, chronic diastolic CHF, PAT, and HTN. Patient underwent calcium score on 07/27/18 which was elevated at 64, 99th percentile when matched for age and sex. Given cardiovascular history, calcium score and recent lipid panel showing elevated LDL of 140, patient would benefit from aggressive antihyperlipidemic therapy. Patient's 10-year ASCVD risk is 12.2%. Patient was treated with rosuvastatin in the past but was discontinued due to muscle aches. Patient presents to clinic today to assess other lipid lowering options.   Patient is in good spirits today. Patient reports previously taking rosuvastatin 20 mg daily, then transitioned to 20 mg every other day due to muscle aches. Patient stopped rosuvastatin in August 2019 due to intolerance. Patient has recently contributed some of her muscle aches to inflammation which improved after steroid shots. Patient re-initiated rosuvastatin at 20 mg every Monday, Wednesday, Friday approximately 3 weeks ago and denies any myalgias today. Discussed with patient the benefits of adding PCSK9i or Zetia. Patient wishes to speak with family before starting new medication. Patient reports chronic limp due to past injury to left knee which has limited exercise in addition to shortness of breath. Encouraged patient to start with low-intensity exercises. Of note, patient reports recently discovered hepatic steatosis and enlargement of the lateral segment of left lobe of liver found on CT 07/27/2018. LFTs wnl. Patient is following up with GI specialist in 2 weeks.   Current Medications: Rosuvastatin 20 mg qMWF Intolerances: rosuvastatin 10 & 20 mg daily - muscle pain  Risk Factors: Calcium risk score 871 (99 percentile), obesity, family history,  HTN, diabetes, elevated LDL, age LDL goal: <70 mg/dL  Diet: Consumes fish and chicken, tends to avoid red meats. Trying to reduce carbohydrates.   Exercise: Limited due to double knee replacement and shortness of breath during exercise  Family History: Mother - HTN, arrhythmia; Father - heart disease (died of MI at 43); Brother - depression; Paternal Grandmother - diabetes   Social History: Denies tobacco and drug use. Reports occasional alcohol use.  Labs: 06/28/2018: TC 217, TG 185, HDL 40, LDL 140 (no lipid lowering therapy) 06/28/2018: Alk Phos 93, AST 27, ALT 42 06/28/2018: SCr 0.81, K 4.2 06/2018: A1c 6.9% 01/01/2018: TC 133, TG 146, HDL 36, LDL 69, non-HDL 98 (rosuvastatin 20 mg QOD)   Past Medical History:  Diagnosis Date  . Aortic atherosclerosis ([place])   . Chronic diastolic CHF (congestive heart failure) ([place])   . Coronary artery calcification seen on CAT scan    very high calcium score at 875  . Diabetes mellitus type 2 in obese ([place])   . Fibroid   . HTN (hypertension)   . Hyperlipidemia   . Morbid obesity ([place])   . OSA on CPAP   . PAT (paroxysmal atrial tachycardia) ([place])   . Premature atrial contractions   . PVC's (premature ventricular contractions)     Current Outpatient Medications on File Prior to Visit  Medication Sig Dispense Refill  . CLINPRO 5000 1.1 % PSTE Take 1 application by mouth 2 (two) times daily. Reported on 08/11/2015  10  . diltiazem (CARDIZEM CD) 120 MG 24  hr capsule TAKE 1 CAPSULE BY MOUTH EVERY DAY 90 capsule 3  . Dulaglutide (TRULICITY [place]) Inject into the skin. Inject into the skin once weekly.    Marland Kitchen glimepiride (AMARYL) 4 MG tablet TAKE 1 TABLET BY MOUTH EVERY DAY BEFORE BREAKFAST 90 tablet 0  . lisinopril-hydrochlorothiazide (PRINZIDE,ZESTORETIC) 10-12.5 MG per tablet Take 1 tablet by mouth daily.     . metFORMIN (GLUCOPHAGE-XR) 500 MG 24 hr tablet TAKE 2 TABLETS BY MOUTH TWICE A DAY WITH MEALS 360 tablet 0  . metoprolol succinate (TOPROL XL) 25  MG 24 hr tablet Take 1 tablet (25 mg total) by mouth daily. 90 tablet 3  . metoprolol tartrate (LOPRESSOR) 100 MG tablet Take 1 tablet, 100 mg, by mouth, 2 hours before your Cardiac CT. 1 tablet 0  . vitamin B-12 (CYANOCOBALAMIN) 1000 MCG tablet Take 1,000 mcg by mouth daily. Take 1,000 mcg sublingual once daily.     No current facility-administered medications on file prior to visit.     Allergies  Allergen Reactions  . Azithromycin Nausea And Vomiting  . Latex Other (See Comments)    REACTION: "CONTACT DERMATITIS"  . Statins Other (See Comments)  . Erythromycin Diarrhea    Assessment/Plan:  1. Hyperlipidemia - Patient's LDL is above goal <70 mg/dL. Patient has been tolerating recent initiation of rosuvastatin 20 mg qMWF. Patient instructed to try taking rosuvastatin up to 7 days per week. Patient would benefit most with starting PCSK9i to get her to goal. Repatha prior authorization submitted and approved immediately. Patient would still like to discuss with family before deciding to start therapy. Advised to call clinic once she has made a decision. Given patient's limited exercise tolerance, encouraged to start with low-intensity exercises like walking. Instructed patient to continue avoiding red meats and consuming food low in saturated fats.   Patient seen with Willia Craze, Pharmacy Student  Sadat Sliwa E. Huma Imhoff, PharmD, BCACP, [facility] [address] [place], [place] [address] Phone: [phone]; Fax: [phone] 08/22/2018 3:21 PM   Addendum: CT on 08/24/18 revealed coronary artery calcifications in LAD (<30% prox and <50% mid)) and < 30% prox/mid/diastal, aggressive risk factor modification warranted per Dr Jaime Nichols. Rx for Repatha sent to local pharmacy along with copay card details to make monthly cost $5. Pt still wishing to discuss safety with GI physician due to recent finding of hepatic steatosis.

## 2018-08-22 NOTE — Patient Instructions (Addendum)
It was great to see you today!  We will start the process with your insurance for the new medication Repatha.  Continue taking rosuvastatin 20 mg three days a week. You may take up to once daily (140 mg per week) as tolerable.   Feel free to contact us at [phone].

## 2018-08-24 ENCOUNTER — Ambulatory Visit (HOSPITAL_COMMUNITY): Admission: RE | Admit: 2018-08-24 | Payer: BLUE CROSS/BLUE SHIELD | Source: Ambulatory Visit

## 2018-08-24 ENCOUNTER — Ambulatory Visit (HOSPITAL_COMMUNITY)
Admission: RE | Admit: 2018-08-24 | Discharge: 2018-08-24 | Disposition: A | Discharge status: Home / Self Care | Payer: BLUE CROSS/BLUE SHIELD | Source: Ambulatory Visit | Attending: Cardiology | Admitting: Cardiology

## 2018-08-24 DIAGNOSIS — R931 Abnormal findings on diagnostic imaging of heart and coronary circulation: Secondary | ICD-10-CM | POA: Diagnosis not present

## 2018-08-24 DIAGNOSIS — I2510 Atherosclerotic heart disease of native coronary artery without angina pectoris: Secondary | ICD-10-CM | POA: Diagnosis not present

## 2018-08-24 DIAGNOSIS — L720 Epidermal cyst: Secondary | ICD-10-CM | POA: Diagnosis not present

## 2018-08-24 MED ORDER — NITROGLYCERIN 0.4 MG SL SUBL
SUBLINGUAL_TABLET | SUBLINGUAL | Status: AC
  Administered 2018-08-24: 0.8 mg via SUBLINGUAL
  Filled 2018-08-24: qty 2

## 2018-08-24 MED ORDER — NITROGLYCERIN 0.4 MG SL SUBL
0.8000 mg | SUBLINGUAL_TABLET | Freq: Once | SUBLINGUAL | Status: AC
  Administered 2018-08-24: 0.8 mg via SUBLINGUAL
  Filled 2018-08-24: qty 25

## 2018-08-24 MED ORDER — METOPROLOL TARTRATE 5 MG/5ML IV SOLN
INTRAVENOUS | Status: AC
  Administered 2018-08-24: 5 mg via INTRAVENOUS
  Filled 2018-08-24: qty 5

## 2018-08-24 MED ORDER — IOHEXOL 350 MG/ML SOLN
100.0000 mL | Freq: Once | INTRAVENOUS | Status: AC | PRN
  Administered 2018-08-24: 100 mL via INTRAVENOUS

## 2018-08-24 MED ORDER — METOPROLOL TARTRATE 5 MG/5ML IV SOLN
INTRAVENOUS | Status: AC
  Administered 2018-08-24: 5 mg via INTRAVENOUS
  Filled 2018-08-24: qty 15

## 2018-08-24 MED ORDER — METOPROLOL TARTRATE 5 MG/5ML IV SOLN
5.0000 mg | INTRAVENOUS | Status: DC | PRN
  Administered 2018-08-24 (×4): 5 mg via INTRAVENOUS
  Filled 2018-08-24 (×4): qty 5

## 2018-08-28 DIAGNOSIS — L720 Epidermal cyst: Secondary | ICD-10-CM | POA: Diagnosis not present

## 2018-08-29 ENCOUNTER — Telehealth: Payer: Self-pay

## 2018-08-29 MED ORDER — ASPIRIN EC 81 MG PO TBEC: 81.0000 mg | DELAYED_RELEASE_TABLET | Freq: Every day | ORAL | 3 refills | Status: DC

## 2018-08-29 NOTE — Telephone Encounter (Signed)
Notes recorded by Sueanne Margarita, MD on 08/24/2018 at 4:02 PM EST Please let patient know that she has coronary artery calcifications in LAD (<30% prox and <50% mid)) and < 30% prox/mid/diastal. She needs aggressive risk factor modfication. She is intolerant to statins - please refer to lipid clinic for PCSK9i. Also add ASA 81mg  daily. Check HbA1C

## 2018-08-29 NOTE — Telephone Encounter (Signed)
-----   Message from Sueanne Margarita, MD sent at 08/24/2018  4:03 PM EST ----- Forward study to PCP for review - patient has fatty liver due to obesity

## 2018-08-29 NOTE — Telephone Encounter (Signed)
Spoke with the patient, she expressed understanding about her results. She had a a recent A1C at Dr. Cindra Eves office, her most recent A1C was 6.8. I also received a staff message from the [place], PharmD, stating the patient was approved for Repatha. The patient stated she would need to discuss with the GI doctor, since she had elevated liver enzymes and a fatty liver. I will fax the records to Dr. Perley Jain office for reference. She has been tolerating aspirin 81 mg.

## 2018-09-02 NOTE — Telephone Encounter (Signed)
repatha is not like the statins and does not have the effects on the liver - please correct me if wrong [facility]

## 2018-09-03 MED ORDER — EVOLOCUMAB 140 MG/ML ~~LOC~~ SOAJ: 1.0000 "pen " | SUBCUTANEOUS | 11 refills | Status: DC

## 2018-09-03 NOTE — Addendum Note (Signed)
Addended by: SUPPLE, MEGAN E on: 09/03/2018 07:44 AM   Modules accepted: Orders

## 2018-09-03 NOTE — Telephone Encounter (Signed)
Please encourage patient to go forward to [place]

## 2018-09-03 NOTE — Telephone Encounter (Signed)
Correct - her CT showed hepatic steatosis which is not a contraindication for PCSK9i use (or statin use either). She should start Repatha due to calcium score in the 99th percentile. Her liver enzymes were also normal when checked in January - ALT was very mildly elevated at 42 but this is not an issue with PCSK9i therapy and we will continue to monitor.

## 2018-09-07 DIAGNOSIS — K760 Fatty (change of) liver, not elsewhere classified: Secondary | ICD-10-CM | POA: Diagnosis not present

## 2018-09-07 DIAGNOSIS — R748 Abnormal levels of other serum enzymes: Secondary | ICD-10-CM | POA: Diagnosis not present

## 2018-09-28 ENCOUNTER — Telehealth: Payer: Self-pay | Admitting: *Deleted

## 2018-09-28 NOTE — Telephone Encounter (Signed)
-----   Message from Freada Bergeron, [place] sent at 09/28/2018  9:02 PM EDT ----- Hollace Kinnier

## 2018-10-01 ENCOUNTER — Telehealth: Payer: Self-pay | Admitting: *Deleted

## 2018-10-01 NOTE — Telephone Encounter (Signed)
-----   Message from Sueanne Margarita, MD sent at 10/01/2018  3:09 PM EDT ----- Hollace Kinnier looks great!  AHI 0.4/hr and very compliant  Traci ----- Message ----- From: Freada Bergeron, CMA Sent: 10/01/2018   1:57 PM EDT To: Sueanne Margarita, MD  Download printed and scanned on one unit patient sent back the other unit.

## 2018-10-01 NOTE — Telephone Encounter (Signed)
Informed patient of compliance results and verbalized understanding was indicated. Patient is aware and agreeable to AHI being within range at 0.4. Patient is aware and agreeable to being in compliance with machine usage Patient is aware and agreeable to no change in current pressures

## 2018-10-01 NOTE — Telephone Encounter (Signed)
download printed and scanned on one unit patient sent back the other unit.

## 2018-10-18 ENCOUNTER — Telehealth: Payer: Self-pay | Admitting: Cardiology

## 2018-10-18 NOTE — Telephone Encounter (Signed)
Video/[email or web address].me/smart phone [id number]/verbal consent4/30/20   YOUR CARDIOLOGY TEAM HAS ARRANGED FOR AN E-VISIT FOR YOUR APPOINTMENT - PLEASE REVIEW IMPORTANT INFORMATION BELOW SEVERAL DAYS PRIOR TO YOUR APPOINTMENT  Due to the recent COVID-19 pandemic, we are transitioning in-person office visits to tele-medicine visits in an effort to decrease unnecessary exposure to our patients, their families, and staff. These visits are billed to your insurance just like a normal visit is. We also encourage you to sign up for MyChart if you have not already done so. You will need a smartphone if possible. For patients that do not have this, we can still complete the visit using a regular telephone but do prefer a smartphone to enable video when possible. You may have a family member that lives with you that can help. If possible, we also ask that you have a blood pressure cuff and scale at home to measure your blood pressure, heart rate and weight prior to your scheduled appointment. Patients with clinical needs that need an in-person evaluation and testing will still be able to come to the office if absolutely necessary. If you have any questions, feel free to call our office.      YOUR PROVIDER WILL BE USING THE FOLLOWING PLATFORM TO COMPLETE YOUR VISIT: \  [email or web address]    IF USING MYCHART - How to Download the MyChart App to Your SmartPhone   - If Apple, go to [employer] and type in MyChart in the search bar and download the app. If Android, ask patient to go to [employer] and type in [facility] in the search bar and download the app. The app is free but as with any other app downloads, your phone may require you to verify saved payment information or Apple/Android password.  - You will need to then log into the app with your MyChart username and password, and select [facility] as your healthcare provider to link the account.  - When it is time for your visit, go to the MyChart app, find appointments,  and click Begin Video Visit. Be sure to Select Allow for your device to access the Microphone and Camera for your visit. You will then be connected, and your provider will be with you shortly.  **If you have any issues connecting or need assistance, please contact MyChart service desk [phone])**  **If using a computer, in order to ensure the best quality for your visit, you will need to use either of the following Internet Browsers: Insurance underwriter or Microsoft Edge**   IF USING DOXIMITY or [email or web address].ME - The staff will give you instructions on receiving your link to join the meeting the day of your visit.      2-3 DAYS BEFORE YOUR APPOINTMENT  You will receive a telephone call from one of our [facility] team members - your caller ID may say "Unknown caller." If this is a video visit, we will walk you through how to get the video launched on your phone. We will remind you check your blood pressure, heart rate and weight prior to your scheduled appointment. If you have an Apple Watch or Kardia, please upload any pertinent ECG strips the day before or morning of your appointment to [facility]. Our staff will also make sure you have reviewed the consent and agree to move forward with your scheduled tele-health visit.     THE DAY OF YOUR APPOINTMENT  Approximately 15 minutes prior to your scheduled appointment, you will receive a telephone call from one of  [facility] team - your caller ID may say "Unknown caller."  Our staff will confirm medications, vital signs for the day and any symptoms you may be experiencing. Please have this information available prior to the time of visit start. It may also be helpful for you to have a pad of paper and pen handy for any instructions given during your visit. They will also walk you through joining the smartphone meeting if this is a video visit.    CONSENT FOR TELE-HEALTH VISIT - PLEASE REVIEW  I hereby voluntarily request, consent and authorize  [facility] and its employed or contracted physicians, physician assistants, nurse practitioners or other licensed health care professionals (the Practitioner), to provide me with telemedicine health care services (the Services") as deemed necessary by the treating Practitioner. I acknowledge and consent to receive the Services by the Practitioner via telemedicine. I understand that the telemedicine visit will involve communicating with the Practitioner through live audiovisual communication technology and the disclosure of certain medical information by electronic transmission. I acknowledge that I have been given the opportunity to request an in-person assessment or other available alternative prior to the telemedicine visit and am voluntarily participating in the telemedicine visit.  I understand that I have the right to withhold or withdraw my consent to the use of telemedicine in the course of my care at any time, without affecting my right to future care or treatment, and that the Practitioner or I may terminate the telemedicine visit at any time. I understand that I have the right to inspect all information obtained and/or recorded in the course of the telemedicine visit and may receive copies of available information for a reasonable fee.  I understand that some of the potential risks of receiving the Services via telemedicine include:   Delay or interruption in medical evaluation due to technological equipment failure or disruption;  Information transmitted may not be sufficient (e.g. poor resolution of images) to allow for appropriate medical decision making by the Practitioner; and/or   In rare instances, security protocols could fail, causing a breach of personal health information.  Furthermore, I acknowledge that it is my responsibility to provide information about my medical history, conditions and care that is complete and accurate to the best of my ability. I acknowledge that  Practitioner's advice, recommendations, and/or decision may be based on factors not within their control, such as incomplete or inaccurate data provided by me or distortions of diagnostic images or specimens that may result from electronic transmissions. I understand that the practice of medicine is not an exact science and that Practitioner makes no warranties or guarantees regarding treatment outcomes. I acknowledge that I will receive a copy of this consent concurrently upon execution via email to the email address I last provided but may also request a printed copy by calling the office of [facility].    I understand that my insurance will be billed for this visit.   I have read or had this consent read to me.  I understand the contents of this consent, which adequately explains the benefits and risks of the Services being provided via telemedicine.   I have been provided ample opportunity to ask questions regarding this consent and the Services and have had my questions answered to my satisfaction.  I give my informed consent for the services to be provided through the use of telemedicine in my medical care  By participating in this telemedicine visit I agree to the above.

## 2018-10-18 NOTE — Progress Notes (Signed)
Virtual Visit via Video Note   This visit type was conducted due to national recommendations for restrictions regarding the COVID-19 Pandemic (e.g. social distancing) in an effort to limit this patient's exposure and mitigate transmission in our community.  Due to her co-morbid illnesses, this patient is at least at moderate risk for complications without adequate follow up.  This format is felt to be most appropriate for this patient at this time.  All issues noted in this document were discussed and addressed.  A limited physical exam was performed with this format.  Please refer to the patient's chart for her consent to telehealth for [facility].    Evaluation Performed:  Follow-up visit  This visit type was conducted due to national recommendations for restrictions regarding the COVID-19 Pandemic (e.g. social distancing).  This format is felt to be most appropriate for this patient at this time.  All issues noted in this document were discussed and addressed.  No physical exam was performed (except for noted visual exam findings with Video Visits).  Please refer to the patient's chart (MyChart message for video visits and phone note for telephone visits) for the patient's consent to telehealth for [facility].  Date:  10/19/2018   ID:  Jaime Nichols, DOB 07-03-1960, MRN [id number]  Patient Location:  Home  Provider location:   [place]  PCP:  Leighton Ruff, MD  Cardiologist:  Fransico Him, MD  Electrophysiologist:  None   Chief Complaint:  OSA, HTN, CHF  History of Present Illness:    Jaime Nichols is a 58 y.o. female who presents via audio/video conferencing for a telehealth visit today.    Jaime Nichols is a 58 y.o. female with a hx ofOSA, obesity, chronic diastolic CHF, PAT and HTN. She has chronic SOB that is multifactorial from obesity, pectus excavatum and possible asthma, followed by Dr. Milton Ferguson.  She is here today for followup and is doing  well.  She denies any chest pain or pressure, SOB, DOE (except for some mild DOE with allergies that resolved with inhaler), PND, orthopnea,dizziness or syncope. Occsaionally she will feel her heart race when going up stairs and feels a skip occasionally at night.  She occasionally has some mild LE if it is a hot day, on her feet a lot or eat too much sodium.  She is compliant with her meds and is tolerating meds with no SE.  She is doing well with her CPAP device and thinks that she has gotten used to it.  She tolerates the mask and feels the pressure is adequate.  Since going on CPAP she feels rested in the am and has no significant daytime sleepiness.  She denies any significant mouth or nasal dryness or nasal congestion.  She does not think that he snores.    The patient does not have symptoms concerning for COVID-19 infection (fever, chills, cough, or new shortness of breath).    Prior CV studies:   The following studies were reviewed today:  2D echo 06/2017, lexiscan myoview 08/2017, cardiopulmonary stress test 09/2017  Past Medical History:  Diagnosis Date   Aortic atherosclerosis (HCC)    Chronic diastolic CHF (congestive heart failure) (HCC)    Coronary artery calcification seen on CAT scan    very high calcium score at 875   Diabetes mellitus type 2 in obese (HCC)    Fibroid    HTN (hypertension)    Hyperlipidemia    Morbid obesity (HCC)    OSA on  CPAP    PAT (paroxysmal atrial tachycardia) (HCC)    Premature atrial contractions    PVC's (premature ventricular contractions)    Past Surgical History:  Procedure Laterality Date   ABDOMINAL HYSTERECTOMY  2010   BSO   Benign uterine polyps  01/2009   DILATION AND CURETTAGE OF UTERUS     HYSTEROSCOPY       Current Meds  Medication Sig   aspirin EC 81 MG tablet Take 1 tablet (81 mg total) by mouth daily.   CLINPRO 5000 1.1 % PSTE Take 1 application by mouth 2 (two) times daily. Reported on 08/11/2015    co-enzyme Q-10 30 MG capsule Take 30 mg by mouth 3 (three) times daily.   diltiazem (CARDIZEM CD) 120 MG 24 hr capsule TAKE 1 CAPSULE BY MOUTH EVERY DAY   Evolocumab (REPATHA SURECLICK) 675 MG/ML SOAJ Inject 1 pen into the skin every 14 (fourteen) days.   glimepiride (AMARYL) 4 MG tablet TAKE 1 TABLET BY MOUTH EVERY DAY BEFORE BREAKFAST   lisinopril-hydrochlorothiazide (PRINZIDE,ZESTORETIC) 10-12.5 MG per tablet Take 1 tablet by mouth daily.    metFORMIN (GLUCOPHAGE-XR) 500 MG 24 hr tablet TAKE 2 TABLETS BY MOUTH TWICE A DAY WITH MEALS   metoprolol succinate (TOPROL XL) 25 MG 24 hr tablet Take 1 tablet (25 mg total) by mouth daily.   rosuvastatin (CRESTOR) 20 MG tablet Take 20 mg by mouth every Monday, Wednesday, and Friday.   TRULICITY 9.16 BW/4.6KZ SOPN INJECT 1 PEN UNDER THE SKIN ONCE A WEEK   [DISCONTINUED] Dulaglutide (TRULICITY [place]) Inject into the skin. Inject into the skin once weekly.     Allergies:   Azithromycin; Latex; Statins; Nickel; and Erythromycin   Social History   Tobacco Use   Smoking status: Never Smoker   Smokeless tobacco: Never Used  Substance Use Topics   Alcohol use: Yes    Comment: occ.   Drug use: No     Family Hx: The patient's family history includes Arrhythmia in her sister and another family member; Depression in her brother; Diabetes in her paternal grandmother; Healthy in her sister and sister; Heart disease in her father; Hypertension in her mother.  ROS:   Please see the history of present illness.     All other systems reviewed and are negative.   Labs/Other Tests and Data Reviewed:    Recent Labs: 06/28/2018: ALT 42 08/13/2018: BUN 12; Creatinine, Ser 0.81; Potassium 4.2; Sodium 139   Recent Lipid Panel Lab Results  Component Value Date/Time   CHOL 217 (H) 06/28/2018 10:08 AM   TRIG 185 (H) 06/28/2018 10:08 AM   HDL 40 06/28/2018 10:08 AM   CHOLHDL 5.4 (H) 06/28/2018 10:08 AM   LDLCALC 140 (H) 06/28/2018 10:08 AM    Wt  Readings from Last 3 Encounters:  10/19/18 274 lb (124.3 kg)  05/24/18 275 lb (124.7 kg)  03/28/18 275 lb (124.7 kg)     Objective:    Vital Signs:  BP 131/71    Pulse 98    Temp 98.5 F (36.9 C)    Ht 5\' 2"  (1.575 m)    Wt 274 lb (124.3 kg)    SpO2 95%    BMI 50.12 kg/m    CONSTITUTIONAL:  Well nourished, well developed female in no acute distress.  EYES: anicteric MOUTH: oral mucosa is pink RESPIRATORY: Normal respiratory effort, symmetric expansion CARDIOVASCULAR: No peripheral edema SKIN: No rash, lesions or ulcers MUSCULOSKELETAL: no digital cyanosis NEURO: Cranial Nerves II-XII grossly intact, moves all extremities  PSYCH: Intact judgement and insight.  A&O x 3, Mood/affect appropriate   ASSESSMENT & PLAN:    1.  Chronic diastolic CHF -she has chronic shortness of breath due to allergies but improves with her inhalers.  She has minimal LE edema.  Her weight is stable.  She will continue on diuretic therapy.  2.  Hypertension -blood pressures controlled on exam today.  She will continue on Cardizem CD 120 mg daily, Toprol-XL 25 mg daily and lisinopril HCT 10-12.5 mg daily.  Creatinine was stable at 0.81 on 08/13/2018.  3.  PAT -sometimes at night she will notice a skipped heart beat but nothing bothersome.  She will continue on Cardizem CD 120 mg daily and Toprol-XL 25 mg daily.  4.  OSA - the patient is tolerating PAP therapy well without any problems. The PAP download was reviewed today and showed an AHI of 0.4/hr on 12.6 cm H2O with 73% compliance in using more than 4 hours nightly.  The patient has been using and benefiting from PAP use and will continue to benefit from therapy.   5.  Morbid Obesity - I have encouraged her to get into a routine exercise program and cut back on carbs and portions. She is contemplating weight loss surgery in the future.  6. Type 2 diabetes mellitus - this is followed by her PCP.  Her last hemoglobin A1c was 6.9 on 06/25/2018.  She will continue  Metformin XR 500 mg daily 2 tablets twice daily and glimepiride 4 mg 1/2 tablet daily.  7.  Coronary artery calcifications by chest CT -she will continue on aspirin 81 mg daily and Crestor.  She needs aggressive risk factor modification with adequate control of diabetes.  Nuclear stress test 2019 showed no ischemia.  She has not had any anginal symptoms.  8.  Hyperlipidemia -her LDL goal is less than 70.  She does not tolerate higher doses of statins.  She will continue on Crestor 20 mg Monday Wednesday and Friday and Repatha.  Her LDL was 140 on 06/28/2018.  I will repeat an FLP and ALT in July once the COVID 19 crisis resolves.  98.  COVID-19 Education:The signs and symptoms of COVID-19 were discussed with the patient and how to seek care for testing (follow up with PCP or arrange E-visit).  The importance of social distancing was discussed today.  Patient Risk:   After full review of this patient's clinical status, I feel that they are at least moderate risk at this time.  Time:   Today, I have spent 20 minutes directly with the patient on video discussing medical problems including coronary ca+, HTN, CHF, DM, obesity, OSA.  We also reviewed the symptoms of COVID 19 and the ways to protect against contracting the virus with telehealth technology.  I spent an additional 5 minutes reviewing patient's chart including PAP compliance download, stress test, 2D echo.  Medication Adjustments/Labs and Tests Ordered: Current medicines are reviewed at length with the patient today.  Concerns regarding medicines are outlined above.  Tests Ordered: No orders of the defined types were placed in this encounter.  Medication Changes: No orders of the defined types were placed in this encounter.   Disposition:  Follow up in 1 year(s)  Signed, Fransico Him, MD  10/19/2018 2:38 PM    [place]

## 2018-10-19 ENCOUNTER — Encounter: Payer: Self-pay | Admitting: Cardiology

## 2018-10-19 ENCOUNTER — Telehealth (INDEPENDENT_AMBULATORY_CARE_PROVIDER_SITE_OTHER): Payer: BLUE CROSS/BLUE SHIELD | Admitting: Cardiology

## 2018-10-19 ENCOUNTER — Other Ambulatory Visit: Payer: Self-pay

## 2018-10-19 VITALS — BP 131/71 | HR 98 | Temp 98.5°F | Ht 62.0 in | Wt 274.0 lb

## 2018-10-19 DIAGNOSIS — I5032 Chronic diastolic (congestive) heart failure: Secondary | ICD-10-CM

## 2018-10-19 DIAGNOSIS — N181 Chronic kidney disease, stage 1: Secondary | ICD-10-CM

## 2018-10-19 DIAGNOSIS — E1122 Type 2 diabetes mellitus with diabetic chronic kidney disease: Secondary | ICD-10-CM

## 2018-10-19 DIAGNOSIS — I4719 Other supraventricular tachycardia: Secondary | ICD-10-CM

## 2018-10-19 DIAGNOSIS — G4733 Obstructive sleep apnea (adult) (pediatric): Secondary | ICD-10-CM

## 2018-10-19 DIAGNOSIS — I2510 Atherosclerotic heart disease of native coronary artery without angina pectoris: Secondary | ICD-10-CM

## 2018-10-19 DIAGNOSIS — I10 Essential (primary) hypertension: Secondary | ICD-10-CM

## 2018-10-19 DIAGNOSIS — Z7189 Other specified counseling: Secondary | ICD-10-CM | POA: Diagnosis not present

## 2018-10-19 DIAGNOSIS — I471 Supraventricular tachycardia: Secondary | ICD-10-CM

## 2018-10-19 DIAGNOSIS — E7800 Pure hypercholesterolemia, unspecified: Secondary | ICD-10-CM

## 2018-10-19 DIAGNOSIS — Z9989 Dependence on other enabling machines and devices: Secondary | ICD-10-CM

## 2018-10-19 NOTE — Telephone Encounter (Signed)
Follow up:    Patient calling stating someone called abut a appt change.please call patient.

## 2018-10-19 NOTE — Patient Instructions (Signed)
Medication Instructions:  Your physician recommends that you continue on your current medications as directed. Please refer to the Current Medication list given to you today.  If you need a refill on your cardiac medications before your next appointment, please call your pharmacy.   Lab work: Fasting Labs: Lipid and Liver on 12/26/18  If you have labs (blood work) drawn today and your tests are completely normal, you will receive your results only by: Marland Kitchen MyChart Message (if you have MyChart) OR . A paper copy in the mail If you have any lab test that is abnormal or we need to change your treatment, we will call you to review the results.  Testing/Procedures: None  Follow-Up: At [facility], you and your health needs are our priority.  As part of our continuing mission to provide you with exceptional heart care, we have created designated Provider Care Teams.  These Care Teams include your primary Cardiologist (physician) and Advanced Practice Providers (APPs -  Physician Assistants and Nurse Practitioners) who all work together to provide you with the care you need, when you need it. You will need a follow up appointment in 1 years.  Please call our office 2 months in advance to schedule this appointment.  You may see Fransico Him, MD or one of the following Advanced Practice Providers on your designated Care Team:   [place], PA-C Melina Copa, PA-C . Ermalinda Barrios, PA-C

## 2018-10-19 NOTE — Telephone Encounter (Signed)
error 

## 2018-10-22 ENCOUNTER — Other Ambulatory Visit: Payer: Self-pay | Admitting: Cardiology

## 2018-10-22 MED ORDER — METOPROLOL SUCCINATE ER 25 MG PO TB24: 25.0000 mg | ORAL_TABLET | Freq: Every day | ORAL | 3 refills | Status: DC

## 2018-11-23 ENCOUNTER — Telehealth: Payer: Self-pay | Admitting: Pharmacist

## 2018-11-23 NOTE — Telephone Encounter (Signed)
Called patient to follow up on Jaime Nichols. Patient states she has been taking it and is doing well. She is taking rosuvastatin 20mg  MWF but is starting to think she is developing some muscle weakness. I recommended that she either try decreasing the dose to 10mg  MWF or try taking 20mg  two times a week to see if that helps. Continue Repatha and follow up with labs in July.

## 2018-12-25 DIAGNOSIS — Z5181 Encounter for therapeutic drug level monitoring: Secondary | ICD-10-CM | POA: Diagnosis not present

## 2018-12-25 DIAGNOSIS — E119 Type 2 diabetes mellitus without complications: Secondary | ICD-10-CM | POA: Diagnosis not present

## 2018-12-25 DIAGNOSIS — Z8639 Personal history of other endocrine, nutritional and metabolic disease: Secondary | ICD-10-CM | POA: Diagnosis not present

## 2018-12-26 ENCOUNTER — Ambulatory Visit (INDEPENDENT_AMBULATORY_CARE_PROVIDER_SITE_OTHER): Payer: BC Managed Care – PPO | Admitting: Women's Health

## 2018-12-26 ENCOUNTER — Other Ambulatory Visit: Payer: BLUE CROSS/BLUE SHIELD

## 2018-12-26 ENCOUNTER — Other Ambulatory Visit: Payer: Self-pay

## 2018-12-26 ENCOUNTER — Encounter: Payer: Self-pay | Admitting: Women's Health

## 2018-12-26 VITALS — BP 132/88 | Ht 63.0 in | Wt 274.8 lb

## 2018-12-26 DIAGNOSIS — N390 Urinary tract infection, site not specified: Secondary | ICD-10-CM | POA: Diagnosis not present

## 2018-12-26 DIAGNOSIS — N898 Other specified noninflammatory disorders of vagina: Secondary | ICD-10-CM | POA: Diagnosis not present

## 2018-12-26 LAB — WET PREP FOR TRICH, YEAST, CLUE

## 2018-12-26 MED ORDER — FLUCONAZOLE 100 MG PO TABS: ORAL_TABLET | ORAL | 0 refills | Status: DC

## 2018-12-26 MED ORDER — TERCONAZOLE 0.4 % VA CREA: 1.0000 | TOPICAL_CREAM | Freq: Every day | VAGINAL | 1 refills | Status: DC

## 2018-12-26 NOTE — Progress Notes (Signed)
History: 58 year old MWF G2P2 presents with 7-10 days of dysuria, itching, and increased urinary incontinence. States it feels similar to yeast infections in past. History of recurrent UTI's and yeast infections. Went to urologist this morning and had negative UA, urine culture pending. Denies any vaginal discharge, odor, back pain, or abdominal pain.  2010 TAH with BSO for endometrial polyps Medical history includes HTN, DM, Hypercholesteremia, osteopenia- PCP managed  Exam: Appears well, morbidly obese. External genitalia with mild erythema at introitus, uncomfortable with trying to place speculum, wet prep done with a Q-tip. Wet prep: no yeast, no clue cells, no WBC, few bacteria.   Vaginal itching/clinical yeast vaginitis Morbid Obesity   Plan: Diflucan 100 mg take 2 tablets today and then 1 weekly as needed. Terazol 7% place externally to vaginal area at bedtime as needed. Instructed to call if symptoms don't improve. Encouraged decreasing calories and carb intake for weight loss.

## 2018-12-26 NOTE — Patient Instructions (Signed)
Vaginal Yeast infection, Adult  Vaginal yeast infection is a condition that causes vaginal discharge as well as soreness, swelling, and redness (inflammation) of the vagina. This is a common condition. Some women get this infection frequently. What are the causes? This condition is caused by a change in the normal balance of the yeast (candida) and bacteria that live in the vagina. This change causes an overgrowth of yeast, which causes the inflammation. What increases the risk? The condition is more likely to develop in women who:  Take antibiotic medicines.  Have diabetes.  Take birth control pills.  Are pregnant.  Douche often.  Have a weak body defense system (immune system).  Have been taking steroid medicines for a long time.  Frequently wear tight clothing. What are the signs or symptoms? Symptoms of this condition include:  White, thick, creamy vaginal discharge.  Swelling, itching, redness, and irritation of the vagina. The lips of the vagina (vulva) may be affected as well.  Pain or a burning feeling while urinating.  Pain during sex. How is this diagnosed? This condition is diagnosed based on:  Your medical history.  A physical exam.  A pelvic exam. Your health care provider will examine a sample of your vaginal discharge under a microscope. Your health care provider may send this sample for testing to confirm the diagnosis. How is this treated? This condition is treated with medicine. Medicines may be over-the-counter or prescription. You may be told to use one or more of the following:  Medicine that is taken by mouth (orally).  Medicine that is applied as a cream (topically).  Medicine that is inserted directly into the vagina (suppository). Follow these instructions at home:  Lifestyle  Do not have sex until your health care provider approves. Tell your sex partner that you have a yeast infection. That person should go to his or her health care  provider and ask if they should also be treated.  Do not wear tight clothes, such as pantyhose or tight pants.  Wear breathable cotton underwear. General instructions  Take or apply over-the-counter and prescription medicines only as told by your health care provider.  Eat more yogurt. This may help to keep your yeast infection from returning.  Do not use tampons until your health care provider approves.  Try taking a sitz bath to help with discomfort. This is a warm water bath that is taken while you are sitting down. The water should only come up to your hips and should cover your buttocks. Do this 3-4 times per day or as told by your health care provider.  Do not douche.  If you have diabetes, keep your blood sugar levels under control.  Keep all follow-up visits as told by your health care provider. This is important. Contact a health care provider if:  You have a fever.  Your symptoms go away and then return.  Your symptoms do not get better with treatment.  Your symptoms get worse.  You have new symptoms.  You develop blisters in or around your vagina.  You have blood coming from your vagina and it is not your menstrual period.  You develop pain in your abdomen. Summary  Vaginal yeast infection is a condition that causes discharge as well as soreness, swelling, and redness (inflammation) of the vagina.  This condition is treated with medicine. Medicines may be over-the-counter or prescription.  Take or apply over-the-counter and prescription medicines only as told by your health care provider.  Do not douche.   Do not have sex or use tampons until your health care provider approves.  Contact a health care provider if your symptoms do not get better with treatment or your symptoms go away and then return. This information is not intended to replace advice given to you by your health care provider. Make sure you discuss any questions you have with your health care  provider. Document Released: 03/16/2005 Document Revised: 10/23/2017 Document Reviewed: 10/23/2017 Elsevier Patient Education  2020 [employer]  

## 2018-12-27 ENCOUNTER — Other Ambulatory Visit: Payer: BC Managed Care – PPO | Admitting: *Deleted

## 2018-12-27 DIAGNOSIS — E7800 Pure hypercholesterolemia, unspecified: Secondary | ICD-10-CM

## 2018-12-27 LAB — LIPID PANEL
Chol/HDL Ratio: 2 ratio (ref 0.0–4.4)
Cholesterol, Total: 83 mg/dL — ABNORMAL LOW (ref 100–199)
HDL: 41 mg/dL (ref >39)
LDL Calculated: 15 mg/dL (ref 0–99)
Triglycerides: 133 mg/dL (ref 0–149)
VLDL Cholesterol Cal: 27 mg/dL (ref 5–40)

## 2018-12-27 LAB — HEPATIC FUNCTION PANEL
ALT: 41 IU/L — ABNORMAL HIGH (ref 0–32)
AST: 28 IU/L (ref 0–40)
Albumin: 4.7 g/dL (ref 3.8–4.9)
Alkaline Phosphatase: 97 IU/L (ref 39–117)
Bilirubin Total: 0.3 mg/dL (ref 0.0–1.2)
Bilirubin, Direct: 0.12 mg/dL (ref 0.00–0.40)
Total Protein: 7.1 g/dL (ref 6.0–8.5)

## 2018-12-28 ENCOUNTER — Telehealth: Payer: Self-pay | Admitting: Pharmacist

## 2018-12-28 DIAGNOSIS — E7800 Pure hypercholesterolemia, unspecified: Secondary | ICD-10-CM

## 2018-12-28 NOTE — Telephone Encounter (Signed)
Reviewed labs with patient. LDL down to 15. Patient states she is starting to have some aches from rosuvastatin and wants to decrease dose. Advised to decrease crestor to 10mg  three days a week from 20mg  three days a week. Ok to cut tablets. Continue Repatha.   ALT slightly elevated. Appears was elevated 6 months ago when patient was on no statin/lipid therapy. Reviewed patients other medications. Possibly from beta blocker. Patient is also on fluconazole, but this was not started until after labs were drawn.  Only mild elevation. Will monitor closely. Will recheck lipids and lft in 6 weeks per Dr. Radford Pax request. Lab appointment scheduled.

## 2019-01-08 DIAGNOSIS — E119 Type 2 diabetes mellitus without complications: Secondary | ICD-10-CM | POA: Diagnosis not present

## 2019-01-08 DIAGNOSIS — Z8639 Personal history of other endocrine, nutritional and metabolic disease: Secondary | ICD-10-CM | POA: Diagnosis not present

## 2019-01-10 ENCOUNTER — Other Ambulatory Visit: Payer: Self-pay | Admitting: Physician Assistant

## 2019-01-11 DIAGNOSIS — E538 Deficiency of other specified B group vitamins: Secondary | ICD-10-CM | POA: Diagnosis not present

## 2019-01-22 ENCOUNTER — Other Ambulatory Visit: Payer: Self-pay

## 2019-01-23 ENCOUNTER — Ambulatory Visit (INDEPENDENT_AMBULATORY_CARE_PROVIDER_SITE_OTHER): Payer: BC Managed Care – PPO | Admitting: Women's Health

## 2019-01-23 ENCOUNTER — Encounter: Payer: Self-pay | Admitting: Women's Health

## 2019-01-23 VITALS — BP 126/80 | Ht 63.0 in | Wt 275.0 lb

## 2019-01-23 DIAGNOSIS — Z01419 Encounter for gynecological examination (general) (routine) without abnormal findings: Secondary | ICD-10-CM | POA: Diagnosis not present

## 2019-01-23 DIAGNOSIS — R300 Dysuria: Secondary | ICD-10-CM

## 2019-01-23 NOTE — Patient Instructions (Signed)

## 2019-01-23 NOTE — Progress Notes (Signed)
Jaime Nichols [birth year] [id number]    History:    Presents for annual exam.  2010 TAH with BSO for fibroids and endometrial polyps and was done at [facility] on no HRT.  Negative colonoscopy at age 58.  Scheduled for repeat September 2020.  Normal Pap and mammogram history.  Reports had osteopenia bone density at primary care record not in chart.  Primary care manages coronary artery disease, diabetes, hyper cholesteremia, hypertension.  Is hoping to do bariatric surgery after COVID pandemic.  Rare sexual activity, husband low libido.  Reports better relationship with husband, questionable early onset Alzheimer's.  Past medical history, past surgical history, family history and social history were all reviewed and documented in the EPIC chart.  Homemaker.  2 daughters ages 87 and 54 both doing well.  ROS:  A ROS was performed and pertinent positives and negatives are included.  Exam:  Vitals:   01/23/19 1147  BP: 126/80  Weight: 275 lb (124.7 kg)  Height: 5\' 3"  (1.6 m)   Body mass index is 48.71 kg/m.   General appearance:  Normal Thyroid:  Symmetrical, normal in size, without palpable masses or nodularity. Respiratory  Auscultation:  Clear without wheezing or rhonchi Cardiovascular  Auscultation:  Regular rate, without rubs, murmurs or gallops  Edema/varicosities:  Not grossly evident Abdominal  Soft,nontender, without masses, guarding or rebound.  Liver/spleen:  No organomegaly noted  Hernia: Nontender umbilical hernia   Skin  Inspection:  Grossly normal   Breasts: Examined lying and sitting.     Right: Without masses, retractions, discharge or axillary adenopathy.     Left: Without masses, retractions, discharge or axillary adenopathy. Gentitourinary   Inguinal/mons:  Normal without inguinal adenopathy  External genitalia:  Normal  BUS/Urethra/Skene's glands:  Normal  Vagina:  Normal  Cervix: And uterus absent   Adnexa/parametria:     Rt: Without masses or  tenderness.   Lt: Without masses or tenderness.  Anus and perineum: Normal  Digital rectal exam: Normal sphincter tone without palpated masses or tenderness  Assessment/Plan:  58 y.o. MWF G2, P2 for annual exam with complaint of stress incontinence/wears a pad daily.  2010 TAH with BSO for fibroids on no HRT Stress incontinence/wears a pad daily Osteopenia-primary care manages Hypertension, diabetes, hypercholesteremia, CAD-primary care manages labs and meds Morbid obesity  Plan: Aware of need to decrease calorie/carbs, contemplating weight loss surgery.  Aware weight loss will also help stress incontinence.  SBEs, continue annual screening mammogram, calcium rich foods, vitamin D 2000 daily encouraged.  Exercise as able, weightbearing and balance type exercise encouraged.  Having slight urinary frequency UA is negative, history of OAB and IC.    Huel Cote [facility], 11:57 AM 01/23/2019

## 2019-01-24 LAB — URINALYSIS, COMPLETE W/RFL CULTURE
Bacteria, UA: NONE SEEN /HPF
Bilirubin Urine: NEGATIVE
Glucose, UA: NEGATIVE
Hgb urine dipstick: NEGATIVE
Hyaline Cast: NONE SEEN /LPF
Leukocyte Esterase: NEGATIVE
Nitrites, Initial: NEGATIVE
Protein, ur: NEGATIVE
RBC / HPF: NONE SEEN /HPF (ref 0–2)
Specific Gravity, Urine: 1.025 (ref 1.001–1.03)
WBC, UA: NONE SEEN /HPF (ref 0–5)
pH: 5.5 (ref 5.0–8.0)

## 2019-01-24 LAB — NO CULTURE INDICATED

## 2019-01-28 DIAGNOSIS — N390 Urinary tract infection, site not specified: Secondary | ICD-10-CM | POA: Diagnosis not present

## 2019-02-08 DIAGNOSIS — E1165 Type 2 diabetes mellitus with hyperglycemia: Secondary | ICD-10-CM | POA: Diagnosis not present

## 2019-02-08 DIAGNOSIS — J984 Other disorders of lung: Secondary | ICD-10-CM | POA: Diagnosis not present

## 2019-02-08 DIAGNOSIS — G4733 Obstructive sleep apnea (adult) (pediatric): Secondary | ICD-10-CM | POA: Diagnosis not present

## 2019-02-08 DIAGNOSIS — I5032 Chronic diastolic (congestive) heart failure: Secondary | ICD-10-CM | POA: Diagnosis not present

## 2019-02-12 ENCOUNTER — Other Ambulatory Visit: Payer: BC Managed Care – PPO

## 2019-02-12 DIAGNOSIS — E538 Deficiency of other specified B group vitamins: Secondary | ICD-10-CM | POA: Diagnosis not present

## 2019-02-14 ENCOUNTER — Other Ambulatory Visit: Payer: BC Managed Care – PPO | Admitting: *Deleted

## 2019-02-14 ENCOUNTER — Telehealth: Payer: Self-pay

## 2019-02-14 ENCOUNTER — Other Ambulatory Visit: Payer: Self-pay

## 2019-02-14 DIAGNOSIS — E7800 Pure hypercholesterolemia, unspecified: Secondary | ICD-10-CM

## 2019-02-14 LAB — HEPATIC FUNCTION PANEL
ALT: 44 IU/L — ABNORMAL HIGH (ref 0–32)
AST: 32 IU/L (ref 0–40)
Albumin: 4.7 g/dL (ref 3.8–4.9)
Alkaline Phosphatase: 103 IU/L (ref 39–117)
Bilirubin Total: 0.4 mg/dL (ref 0.0–1.2)
Bilirubin, Direct: 0.14 mg/dL (ref 0.00–0.40)
Total Protein: 7 g/dL (ref 6.0–8.5)

## 2019-02-14 LAB — LIPID PANEL
Chol/HDL Ratio: 2.4 ratio (ref 0.0–4.4)
Cholesterol, Total: 100 mg/dL (ref 100–199)
HDL: 42 mg/dL (ref >39)
LDL Calculated: 32 mg/dL (ref 0–99)
Triglycerides: 132 mg/dL (ref 0–149)
VLDL Cholesterol Cal: 26 mg/dL (ref 5–40)

## 2019-02-14 NOTE — Telephone Encounter (Signed)
Notes recorded by Frederik Schmidt, RN on 02/14/2019 at 4:01 PM EDT  The patient has been notified of the result and verbalized understanding. All questions (if any) were answered.  Frederik Schmidt, RN 02/14/2019 4:01 PM

## 2019-02-14 NOTE — Telephone Encounter (Signed)
-----   Message from Sueanne Margarita, MD sent at 02/14/2019  3:46 PM EDT ----- Please let patient know that labs were normal.  Continue current medical therapy.

## 2019-02-28 DIAGNOSIS — E559 Vitamin D deficiency, unspecified: Secondary | ICD-10-CM | POA: Diagnosis not present

## 2019-02-28 DIAGNOSIS — M2550 Pain in unspecified joint: Secondary | ICD-10-CM | POA: Diagnosis not present

## 2019-03-19 ENCOUNTER — Encounter: Payer: Self-pay | Admitting: Gynecology

## 2019-03-19 DIAGNOSIS — E538 Deficiency of other specified B group vitamins: Secondary | ICD-10-CM | POA: Diagnosis not present

## 2019-03-19 DIAGNOSIS — Z23 Encounter for immunization: Secondary | ICD-10-CM | POA: Diagnosis not present

## 2019-03-21 DIAGNOSIS — M7532 Calcific tendinitis of left shoulder: Secondary | ICD-10-CM | POA: Diagnosis not present

## 2019-03-21 DIAGNOSIS — M170 Bilateral primary osteoarthritis of knee: Secondary | ICD-10-CM | POA: Diagnosis not present

## 2019-03-21 DIAGNOSIS — M25512 Pain in left shoulder: Secondary | ICD-10-CM | POA: Diagnosis not present

## 2019-04-05 DIAGNOSIS — M25512 Pain in left shoulder: Secondary | ICD-10-CM | POA: Diagnosis not present

## 2019-04-12 DIAGNOSIS — M25512 Pain in left shoulder: Secondary | ICD-10-CM | POA: Diagnosis not present

## 2019-04-16 DIAGNOSIS — M25512 Pain in left shoulder: Secondary | ICD-10-CM | POA: Diagnosis not present

## 2019-04-17 DIAGNOSIS — M170 Bilateral primary osteoarthritis of knee: Secondary | ICD-10-CM | POA: Diagnosis not present

## 2019-04-19 DIAGNOSIS — M25512 Pain in left shoulder: Secondary | ICD-10-CM | POA: Diagnosis not present

## 2019-04-23 DIAGNOSIS — M25512 Pain in left shoulder: Secondary | ICD-10-CM | POA: Diagnosis not present

## 2019-04-24 DIAGNOSIS — M170 Bilateral primary osteoarthritis of knee: Secondary | ICD-10-CM | POA: Diagnosis not present

## 2019-05-01 DIAGNOSIS — M170 Bilateral primary osteoarthritis of knee: Secondary | ICD-10-CM | POA: Diagnosis not present

## 2019-05-06 DIAGNOSIS — M25512 Pain in left shoulder: Secondary | ICD-10-CM | POA: Diagnosis not present

## 2019-05-07 DIAGNOSIS — E538 Deficiency of other specified B group vitamins: Secondary | ICD-10-CM | POA: Diagnosis not present

## 2019-05-09 DIAGNOSIS — M25561 Pain in right knee: Secondary | ICD-10-CM | POA: Diagnosis not present

## 2019-05-23 DIAGNOSIS — M25561 Pain in right knee: Secondary | ICD-10-CM | POA: Diagnosis not present

## 2019-05-31 DIAGNOSIS — Z20828 Contact with and (suspected) exposure to other viral communicable diseases: Secondary | ICD-10-CM | POA: Diagnosis not present

## 2019-06-06 DIAGNOSIS — M25561 Pain in right knee: Secondary | ICD-10-CM | POA: Diagnosis not present

## 2019-06-10 DIAGNOSIS — E538 Deficiency of other specified B group vitamins: Secondary | ICD-10-CM | POA: Diagnosis not present

## 2019-06-21 HISTORY — PX: URETEROSCOPY WITH HOLMIUM LASER LITHOTRIPSY: SHX6645

## 2019-06-26 IMAGING — CT CT HEART MORP W/ CTA COR W/ SCORE W/ CA W/CM &/OR W/O CM
4 of 7 series · 8 of 20 positions shown, 9 images · IV contrast (APPLIED)
Comparison: 07/27/2018 coronary calcium score
COMPARISON: 07/27/2018 coronary calcium score
COMPARISON: 07/27/2018 coronary calcium score

Addendum:
EXAM:
OVER-READ INTERPRETATION  CT CHEST

The following report is an over-read performed by radiologist Dr.
Meskrme Perara [REDACTED] on 08/24/2018. This over-read
does not include interpretation of cardiac or coronary anatomy or
pathology. The coronary CTA interpretation by the cardiologist is
attached.
CLINICAL DATA: Chest pain
Cardiac CTA
MEDICATIONS:
Sub lingual nitro. 4 mg and lopressor 20mg
TECHNIQUE: The patient was scanned on a Siemens Force 192 scanner. Gantry
rotation speed was 250 msecs. Collimation was. 6 mm . A 120 kV
prospective scan was triggered in the ascending thoracic aorta at
140 HU's with full mA between 30-70% of the R-R interval . Average
HR during the scan was 80 bpm. The 3D data set was interpreted on a
dedicated work station using MPR, MIP and VRT modes. A total of 80
cc of contrast was used.

[Series 6: best diast 75 % · axial · 0.39mm/px · z∈[+1248,+1292]mm · 2 of 331 slices shown, 3 images]
[im 111/331  vessel]
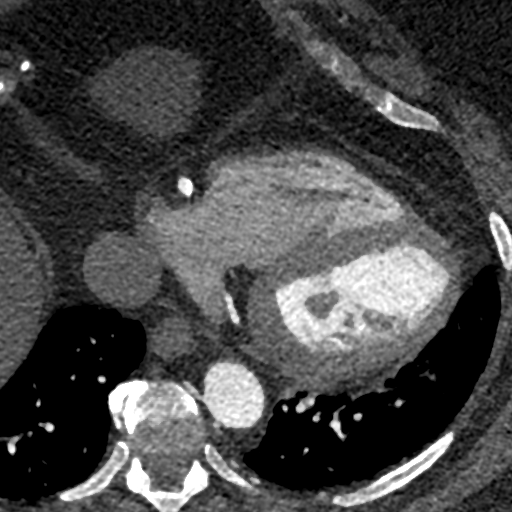
[im 111/331  lung]
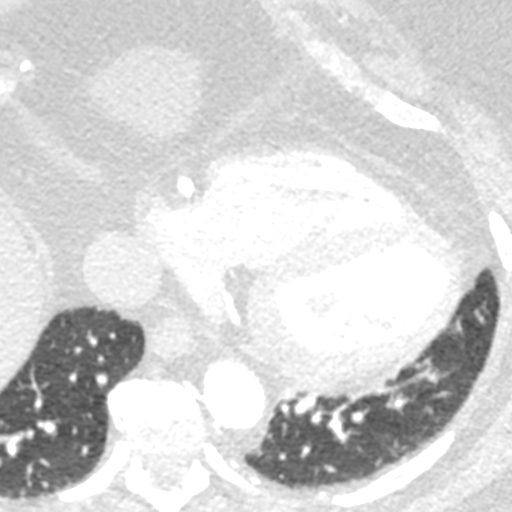
[im 221/331  vessel]
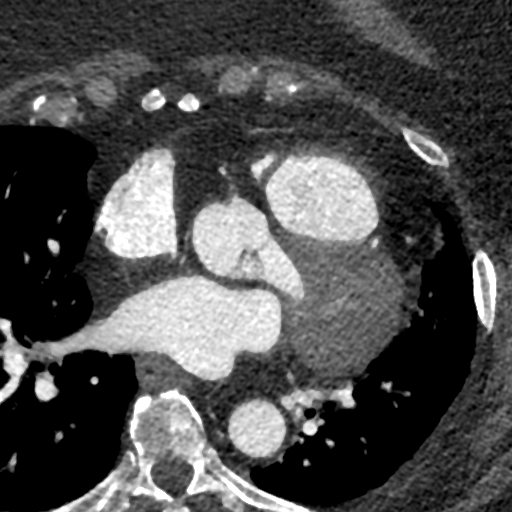

[Series 7: best syst 43 % · axial · 0.39mm/px · z∈[+1248,+1292]mm · 2 of 331 slices shown]
[im 111/331  vessel]
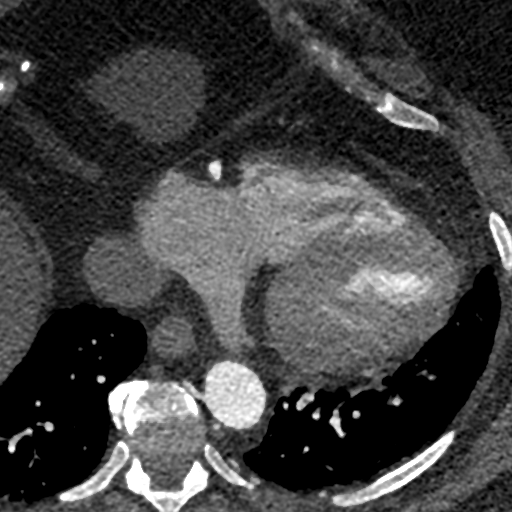
[im 221/331  vessel]
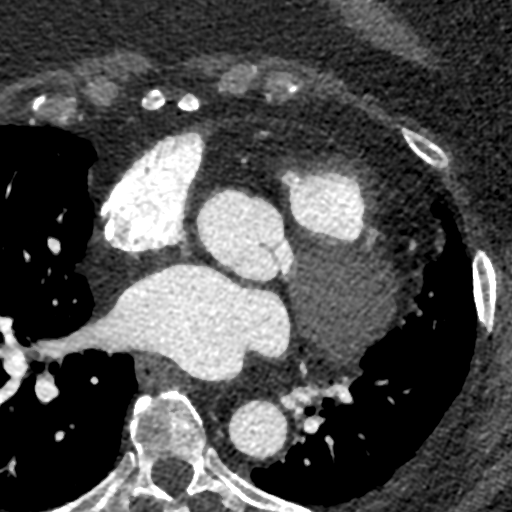

[Series 8: ts diast sharp 75 % · axial · 0.39mm/px · z∈[+1248,+1292]mm · 2 of 331 slices shown]
[im 111/331  lung]
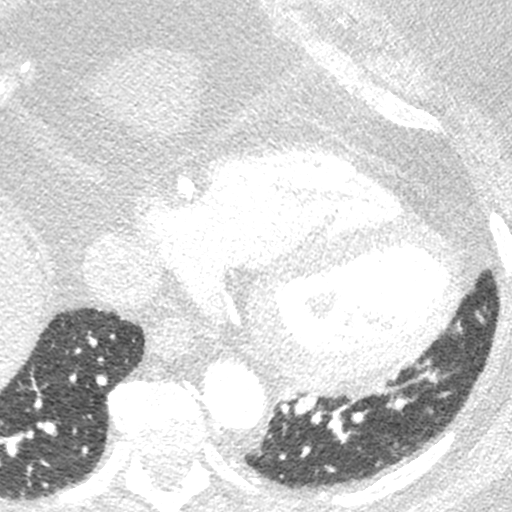
[im 221/331  lung]
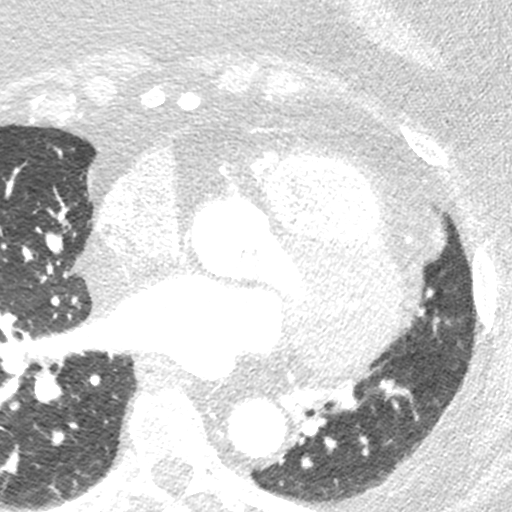

[Series 9: ts syst sharp 43 % · axial · 0.39mm/px · z∈[+1248,+1292]mm · 2 of 331 slices shown]
[im 111/331  lung]
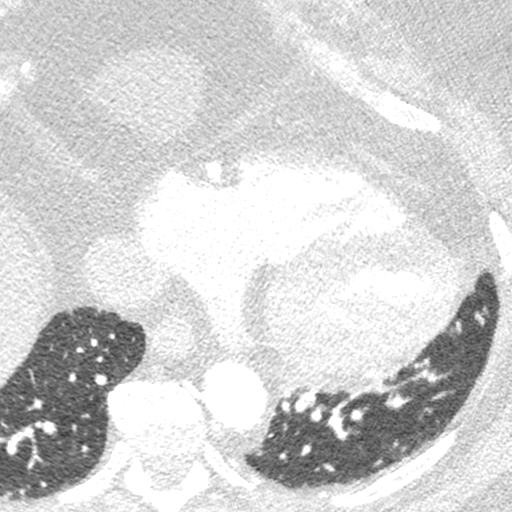
[im 221/331  lung]
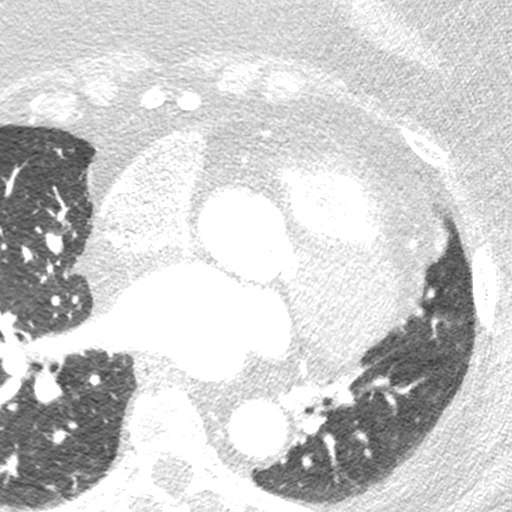

[8 of 20 positions shown; findings below may reference images not displayed]

FINDINGS: Vascular: Heart is normal size.  Visualized aorta normal caliber.

Mediastinum/Nodes: Calcified right subcarinal lymph nodes, stable.
No adenopathy in the lower mediastinum or hila.

Lungs/Pleura: No confluent opacities or effusions.

Upper Abdomen: Probable fatty infiltration of the liver. No acute
findings.

Musculoskeletal: Imaging into the upper abdomen shows no acute
findings. No acute bony abnormality.
IMPRESSION: Calcified lymph nodes compatible with old granulomatous disease.

Probable fatty infiltration of the liver.

No acute findings.
FINDINGS: Non-cardiac: See separate report from [REDACTED]. No
significant findings on limited lung and soft tissue windows.

Calcium score: Calcium noted in LAD and RCA

Coronary Arteries: Right dominant with no anomalies

LM: Normal

LAD: Less than 30% calcified plaque in proximal vessel. 50% <
calcified plaque in mid vessel

D1: Normal

D2: Normal

Circumflex: Normal

OM1: Normal

OM2: Normal

RCA: Less than 30% calcified plaque in proximal mid and distal
vessel

PDA: Normal

PLA: Normal
IMPRESSION: 1.  Calcium score 846 which is 99 th percentile for age and sex

2.  Non obstructive CAD see description above

3.  Normal aortic root

Note would not repeat cardiac CT in future Poor quality study due to
obesity and inability to reduce HR below 80 bpm despite 20 mg iv
lopressor and 200 mg PO

Study not suitable for FFR CT due to poor opacification

Tamiko Mena

*** End of Addendum ***
Addendum:
EXAM:
OVER-READ INTERPRETATION  CT CHEST

The following report is an over-read performed by radiologist Dr.
Meskrme Perara [REDACTED] on 08/24/2018. This over-read
does not include interpretation of cardiac or coronary anatomy or
pathology. The coronary CTA interpretation by the cardiologist is
attached.
FINDINGS: Vascular: Heart is normal size.  Visualized aorta normal caliber.

Mediastinum/Nodes: Calcified right subcarinal lymph nodes, stable.
No adenopathy in the lower mediastinum or hila.

Lungs/Pleura: No confluent opacities or effusions.

Upper Abdomen: Probable fatty infiltration of the liver. No acute
findings.

Musculoskeletal: Imaging into the upper abdomen shows no acute
findings. No acute bony abnormality.
IMPRESSION: Calcified lymph nodes compatible with old granulomatous disease.

Probable fatty infiltration of the liver.

No acute findings.
FINDINGS: Non-cardiac: See separate report from [REDACTED]. No
significant findings on limited lung and soft tissue windows.

Calcium score: Calcium noted in LAD and RCA

Coronary Arteries: Right dominant with no anomalies

LM: Normal

LAD: Less than 30% calcified plaque in proximal vessel. 50% <
calcified plaque in mid vessel

D1: Normal

D2: Normal

Circumflex: Normal

OM1: Normal

OM2: Normal

RCA: Less than 30% calcified plaque in proximal mid and distal
vessel

PDA: Normal

PLA: Normal
IMPRESSION: 1.  Calcium score 846 which is 99 th percentile for age and sex

2.  Non obstructive CAD see description above

3.  Normal aortic root

Note would not repeat cardiac CT in future Poor quality study due to
obesity and inability to reduce HR below 80 bpm despite 20 mg iv
lopressor and 200 mg PO

Study not suitable for FFR CT due to poor opacification

Tamiko Mena

*** End of Addendum ***
EXAM:
OVER-READ INTERPRETATION  CT CHEST

The following report is an over-read performed by radiologist Dr.
Meskrme Perara [REDACTED] on 08/24/2018. This over-read
does not include interpretation of cardiac or coronary anatomy or
pathology. The coronary CTA interpretation by the cardiologist is
attached.
FINDINGS: Vascular: Heart is normal size.  Visualized aorta normal caliber.

Mediastinum/Nodes: Calcified right subcarinal lymph nodes, stable.
No adenopathy in the lower mediastinum or hila.

Lungs/Pleura: No confluent opacities or effusions.

Upper Abdomen: Probable fatty infiltration of the liver. No acute
findings.

Musculoskeletal: Imaging into the upper abdomen shows no acute
findings. No acute bony abnormality.
IMPRESSION: Calcified lymph nodes compatible with old granulomatous disease.

Probable fatty infiltration of the liver.

No acute findings.

## 2019-07-01 DIAGNOSIS — Z8639 Personal history of other endocrine, nutritional and metabolic disease: Secondary | ICD-10-CM | POA: Diagnosis not present

## 2019-07-01 DIAGNOSIS — E119 Type 2 diabetes mellitus without complications: Secondary | ICD-10-CM | POA: Diagnosis not present

## 2019-07-01 DIAGNOSIS — Z5181 Encounter for therapeutic drug level monitoring: Secondary | ICD-10-CM | POA: Diagnosis not present

## 2019-07-12 DIAGNOSIS — E119 Type 2 diabetes mellitus without complications: Secondary | ICD-10-CM | POA: Diagnosis not present

## 2019-07-12 DIAGNOSIS — Z79899 Other long term (current) drug therapy: Secondary | ICD-10-CM | POA: Diagnosis not present

## 2019-07-12 DIAGNOSIS — E538 Deficiency of other specified B group vitamins: Secondary | ICD-10-CM | POA: Diagnosis not present

## 2019-07-16 DIAGNOSIS — M1711 Unilateral primary osteoarthritis, right knee: Secondary | ICD-10-CM | POA: Diagnosis not present

## 2019-07-25 ENCOUNTER — Telehealth: Payer: Self-pay | Admitting: Cardiology

## 2019-07-25 ENCOUNTER — Other Ambulatory Visit: Payer: Self-pay | Admitting: Pharmacist

## 2019-07-25 MED ORDER — REPATHA SURECLICK 140 MG/ML ~~LOC~~ SOAJ: 1.0000 "pen " | SUBCUTANEOUS | 11 refills | Status: DC

## 2019-07-25 NOTE — Telephone Encounter (Signed)
PA was approved yesterday through 07/21/20. Called pharmacy, they re-ran the claim stating it still said PA needed. I have faxed them the approval letter with the approved [place] on it.

## 2019-07-25 NOTE — Telephone Encounter (Signed)
*  STAT* If patient is at the pharmacy, call can be transferred to refill team.   1. Which medications need to be refilled? (please list name of each medication and dose if known) Evolocumab (REPATHA SURECLICK) [id number] MG/ML SOAJ  2. Which pharmacy/location (including street and city if local pharmacy) is medication to be sent to? [id number] - [place], [place] - [address]  3. Do they need a 30 day or 90 day supply? 30 day  Needs to have DAW 1. So insurance will pay for it. And it also needs to have a prior auth.

## 2019-07-29 NOTE — Telephone Encounter (Signed)
Received another request for PA. I have submitted via covermymeds

## 2019-07-30 MED ORDER — ROSUVASTATIN CALCIUM 5 MG PO TABS: ORAL_TABLET | ORAL | 3 refills | Status: DC

## 2019-07-30 NOTE — Telephone Encounter (Signed)
PA request was canceled. We had record that PA was already on file. I called the patient to confirm she was able to get medication. Left message for call back

## 2019-07-30 NOTE — Addendum Note (Signed)
Addended by: Marcelle Overlie D on: 07/30/2019 11:50 AM   Modules accepted: Orders

## 2019-07-30 NOTE — Telephone Encounter (Signed)
Patient was able to get Repatha from pharmacy. She is not taking rosuvastatin currently. She states that her muscles started to bother her on rosuvastatin 10mg  three times a week. We discussed the benefits of statin therapy even at low doses. Patient willing to try rosuvastatin 5mg  once a week. Rx sent to pharmacy

## 2019-08-14 DIAGNOSIS — K5730 Diverticulosis of large intestine without perforation or abscess without bleeding: Secondary | ICD-10-CM | POA: Diagnosis not present

## 2019-08-14 DIAGNOSIS — N200 Calculus of kidney: Secondary | ICD-10-CM | POA: Diagnosis not present

## 2019-08-14 DIAGNOSIS — R160 Hepatomegaly, not elsewhere classified: Secondary | ICD-10-CM | POA: Diagnosis not present

## 2019-08-14 DIAGNOSIS — R319 Hematuria, unspecified: Secondary | ICD-10-CM | POA: Diagnosis not present

## 2019-08-23 DIAGNOSIS — E538 Deficiency of other specified B group vitamins: Secondary | ICD-10-CM | POA: Diagnosis not present

## 2019-08-27 DIAGNOSIS — E119 Type 2 diabetes mellitus without complications: Secondary | ICD-10-CM | POA: Diagnosis not present

## 2019-08-27 DIAGNOSIS — H5213 Myopia, bilateral: Secondary | ICD-10-CM | POA: Diagnosis not present

## 2019-08-27 DIAGNOSIS — H2513 Age-related nuclear cataract, bilateral: Secondary | ICD-10-CM | POA: Diagnosis not present

## 2019-08-29 DIAGNOSIS — N200 Calculus of kidney: Secondary | ICD-10-CM | POA: Diagnosis not present

## 2019-08-29 DIAGNOSIS — R319 Hematuria, unspecified: Secondary | ICD-10-CM | POA: Diagnosis not present

## 2019-09-02 ENCOUNTER — Other Ambulatory Visit: Payer: Self-pay

## 2019-09-03 ENCOUNTER — Encounter: Payer: Self-pay | Admitting: Women's Health

## 2019-09-03 ENCOUNTER — Other Ambulatory Visit: Payer: Self-pay

## 2019-09-03 ENCOUNTER — Ambulatory Visit (INDEPENDENT_AMBULATORY_CARE_PROVIDER_SITE_OTHER): Payer: BC Managed Care – PPO | Admitting: Women's Health

## 2019-09-03 VITALS — BP 128/80 | Ht 63.0 in | Wt 273.0 lb

## 2019-09-03 DIAGNOSIS — N898 Other specified noninflammatory disorders of vagina: Secondary | ICD-10-CM

## 2019-09-03 LAB — WET PREP FOR TRICH, YEAST, CLUE

## 2019-09-03 MED ORDER — FLUCONAZOLE 100 MG PO TABS: ORAL_TABLET | ORAL | 0 refills | Status: DC

## 2019-09-03 MED ORDER — NYSTATIN-TRIAMCINOLONE 100000-0.1 UNIT/GM-% EX OINT: 1.0000 "application " | TOPICAL_OINTMENT | Freq: Two times a day (BID) | CUTANEOUS | 1 refills | Status: DC

## 2019-09-03 NOTE — Progress Notes (Signed)
Jaime Nichols 15-Oct-1960 [id number]  59 year old MWF G2 P2 presents with history of hematuria, numerous kidney stones and questionable vaginal discharge/bleeding.  2010 TAH with BSO for fibroids and benign endometrial polyps on no HRT.  Numerous medical problems including hypertension, CHF, diabetes, hypercholesteremia, and OSA.  Normal Pap and mammogram history overdue for mammogram.  Negative colonoscopy due for repeat will schedule.  States has been minimal places due to Covid with known comorbidities she has.  Exam: Appears well, obese, breast examined sitting and lying position without palpable nodules, dimpling, nipple discharge or palpable masses.  Under both breasts red and inflamed yeastlike rash.  Abdominal umbilical hernia nontender.  External genitalia mild erythema, speculum exam no blood or discharge noted.    History of hematuria with kidney stones Erythemic rash bilaterally under breasts  Plan: Reassurance given regarding no blood noted vaginally as well as history of TAH with BSO.  No erythema noted vaginal walls or discharge.  Refill of Diflucan 100 mg to take 2 as needed, history of recurrent yeast.  We will keep scheduled follow-up for cystoscopy and with urologist.  Encouraged to schedule overdue mammogram.  Refill of Mycolog given placed small amount under both breasts after showers keep area dry.  Questions answered.

## 2019-09-03 NOTE — Patient Instructions (Addendum)
Good to see you today! Vit D 2000 iu daily Mammogram  [phone]  Get scheduled Colonoscopy  Dietary Guidelines to Help Prevent Kidney Stones Kidney stones are deposits of minerals and salts that form inside your kidneys. Your risk of developing kidney stones may be greater depending on your diet, your lifestyle, the medicines you take, and whether you have certain medical conditions. Most people can reduce their chances of developing kidney stones by following the instructions below. Depending on your overall health and the type of kidney stones you tend to develop, your dietitian may give you more specific instructions. What are tips for following this plan? Reading food labels  Choose foods with "no salt added" or "low-salt" labels. Limit your sodium intake to less than 1500 mg per day.  Choose foods with calcium for each meal and snack. Try to eat about 300 mg of calcium at each meal. Foods that contain 200-500 mg of calcium per serving include: ? 8 oz (237 ml) of milk, fortified nondairy milk, and fortified fruit juice. ? 8 oz (237 ml) of kefir, yogurt, and soy yogurt. ? 4 oz (118 ml) of tofu. ? 1 oz of cheese. ? 1 cup (300 g) of dried figs. ? 1 cup (91 g) of cooked broccoli. ? 1-3 oz can of sardines or mackerel.  Most people need 1000 to 1500 mg of calcium each day. Talk to your dietitian about how much calcium is recommended for you. Shopping  Buy plenty of fresh fruits and vegetables. Most people do not need to avoid fruits and vegetables, even if they contain nutrients that may contribute to kidney stones.  When shopping for convenience foods, choose: ? Whole pieces of fruit. ? Premade salads with dressing on the side. ? Low-fat fruit and yogurt smoothies.  Avoid buying frozen meals or prepared deli foods.  Look for foods with live cultures, such as yogurt and kefir. Cooking  Do not add salt to food when cooking. Place a salt shaker on the table and allow each person to add  his or her own salt to taste.  Use vegetable protein, such as beans, textured vegetable protein (TVP), or tofu instead of meat in pasta, casseroles, and soups. Meal planning   Eat less salt, if told by your dietitian. To do this: ? Avoid eating processed or premade food. ? Avoid eating fast food.  Eat less animal protein, including cheese, meat, poultry, or fish, if told by your dietitian. To do this: ? Limit the number of times you have meat, poultry, fish, or cheese each week. Eat a diet free of meat at least 2 days a week. ? Eat only one serving each day of meat, poultry, fish, or seafood. ? When you prepare animal protein, cut pieces into small portion sizes. For most meat and fish, one serving is about the size of one deck of cards.  Eat at least 5 servings of fresh fruits and vegetables each day. To do this: ? Keep fruits and vegetables on hand for snacks. ? Eat 1 piece of fruit or a handful of berries with breakfast. ? Have a salad and fruit at lunch. ? Have two kinds of vegetables at dinner.  Limit foods that are high in a substance called oxalate. These include: ? Spinach. ? Rhubarb. ? Beets. ? Potato chips and french fries. ? Nuts.  If you regularly take a diuretic medicine, make sure to eat at least 1-2 fruits or vegetables high in potassium each day. These include: ? Avocado. ?  Banana. ? Orange, prune, carrot, or tomato juice. ? Baked potato. ? Cabbage. ? Beans and split peas. General instructions   Drink enough fluid to keep your urine clear or pale yellow. This is the most important thing you can do.  Talk to your health care provider and dietitian about taking daily supplements. Depending on your health and the cause of your kidney stones, you may be advised: ? Not to take supplements with vitamin C. ? To take a calcium supplement. ? To take a daily probiotic supplement. ? To take other supplements such as magnesium, fish oil, or vitamin B6.  Take all  medicines and supplements as told by your health care provider.  Limit alcohol intake to no more than 1 drink a day for nonpregnant women and 2 drinks a day for men. One drink equals 12 oz of beer, 5 oz of wine, or 1 oz of hard liquor.  Lose weight if told by your health care provider. Work with your dietitian to find strategies and an eating plan that works best for you. What foods are not recommended? Limit your intake of the following foods, or as told by your dietitian. Talk to your dietitian about specific foods you should avoid based on the type of kidney stones and your overall health. Grains Breads. Bagels. Rolls. Baked goods. Salted crackers. Cereal. Pasta. Vegetables Spinach. Rhubarb. Beets. Canned vegetables. Jaime Nichols. Olives. Meats and other protein foods Nuts. Nut butters. Large portions of meat, poultry, or fish. Salted or cured meats. Deli meats. Hot dogs. Sausages. Dairy Cheese. Beverages Regular soft drinks. Regular vegetable juice. Seasonings and other foods Seasoning blends with salt. Salad dressings. Canned soups. Soy sauce. Ketchup. Barbecue sauce. Canned pasta sauce. Casseroles. Pizza. Lasagna. Frozen meals. Potato chips. [place] fries. Summary  You can reduce your risk of kidney stones by making changes to your diet.  The most important thing you can do is drink enough fluid. You should drink enough fluid to keep your urine clear or pale yellow.  Ask your health care provider or dietitian how much protein from animal sources you should eat each day, and also how much salt and calcium you should have each day. This information is not intended to replace advice given to you by your health care provider. Make sure you discuss any questions you have with your health care provider. Document Revised: 09/26/2018 Document Reviewed: 05/17/2016 Elsevier Patient Education  2020 [place] Maintenance for Postmenopausal Women Menopause is a normal process in which  your ability to get pregnant comes to an end. This process happens slowly over many months or years, usually between the ages of 58 and 16. Menopause is complete when you have missed your menstrual periods for 12 months. It is important to talk with your health care provider about some of the most common conditions that affect women after menopause (postmenopausal women). These include heart disease, cancer, and bone loss (osteoporosis). Adopting a healthy lifestyle and getting preventive care can help to promote your health and wellness. The actions you take can also lower your chances of developing some of these common conditions. What should I know about menopause? During menopause, you may get a number of symptoms, such as:  Hot flashes. These can be moderate or severe.  Night sweats.  Decrease in sex drive.  Mood swings.  Headaches.  Tiredness.  Irritability.  Memory problems.  Insomnia. Choosing to treat or not to treat these symptoms is a decision that you make with your health  care provider. Do I need hormone replacement therapy?  Hormone replacement therapy is effective in treating symptoms that are caused by menopause, such as hot flashes and night sweats.  Hormone replacement carries certain risks, especially as you become older. If you are thinking about using estrogen or estrogen with progestin, discuss the benefits and risks with your health care provider. What is my risk for heart disease and stroke? The risk of heart disease, heart attack, and stroke increases as you age. One of the causes may be a change in the body's hormones during menopause. This can affect how your body uses dietary fats, triglycerides, and cholesterol. Heart attack and stroke are medical emergencies. There are many things that you can do to help prevent heart disease and stroke. Watch your blood pressure  High blood pressure causes heart disease and increases the risk of stroke. This is more  likely to develop in people who have high blood pressure readings, are of African descent, or are overweight.  Have your blood pressure checked: ? Every 3-5 years if you are 3-72 years of age. ? Every year if you are 63 years old or older. Eat a healthy diet   Eat a diet that includes plenty of vegetables, fruits, low-fat dairy products, and lean protein.  Do not eat a lot of foods that are high in solid fats, added sugars, or sodium. Get regular exercise Get regular exercise. This is one of the most important things you can do for your health. Most adults should:  Try to exercise for at least 150 minutes each week. The exercise should increase your heart rate and make you sweat (moderate-intensity exercise).  Try to do strengthening exercises at least twice each week. Do these in addition to the moderate-intensity exercise.  Spend less time sitting. Even light physical activity can be beneficial. Other tips  Work with your health care provider to achieve or maintain a healthy weight.  Do not use any products that contain nicotine or tobacco, such as cigarettes, e-cigarettes, and chewing tobacco. If you need help quitting, ask your health care provider.  Know your numbers. Ask your health care provider to check your cholesterol and your blood sugar (glucose). Continue to have your blood tested as directed by your health care provider. Do I need screening for cancer? Depending on your health history and family history, you may need to have cancer screening at different stages of your life. This may include screening for:  Breast cancer.  Cervical cancer.  Lung cancer.  Colorectal cancer. What is my risk for osteoporosis? After menopause, you may be at increased risk for osteoporosis. Osteoporosis is a condition in which bone destruction happens more quickly than new bone creation. To help prevent osteoporosis or the bone fractures that can happen because of osteoporosis, you may  take the following actions:  If you are 89-51 years old, get at least 1,000 mg of calcium and at least 600 mg of vitamin D per day.  If you are older than age 65 but younger than age 26, get at least 1,200 mg of calcium and at least 600 mg of vitamin D per day.  If you are older than age 38, get at least 1,200 mg of calcium and at least 800 mg of vitamin D per day. Smoking and drinking excessive alcohol increase the risk of osteoporosis. Eat foods that are rich in calcium and vitamin D, and do weight-bearing exercises several times each week as directed by your health care provider.  How does menopause affect my mental health? Depression may occur at any age, but it is more common as you become older. Common symptoms of depression include:  Low or sad mood.  Changes in sleep patterns.  Changes in appetite or eating patterns.  Feeling an overall lack of motivation or enjoyment of activities that you previously enjoyed.  Frequent crying spells. Talk with your health care provider if you think that you are experiencing depression. General instructions See your health care provider for regular wellness exams and vaccines. This may include:  Scheduling regular health, dental, and eye exams.  Getting and maintaining your vaccines. These include: ? Influenza vaccine. Get this vaccine each year before the flu season begins. ? Pneumonia vaccine. ? Shingles vaccine. ? Tetanus, diphtheria, and pertussis (Tdap) booster vaccine. Your health care provider may also recommend other immunizations. Tell your health care provider if you have ever been abused or do not feel safe at home. Summary  Menopause is a normal process in which your ability to get pregnant comes to an end.  This condition causes hot flashes, night sweats, decreased interest in sex, mood swings, headaches, or lack of sleep.  Treatment for this condition may include hormone replacement therapy.  Take actions to keep yourself  healthy, including exercising regularly, eating a healthy diet, watching your weight, and checking your blood pressure and blood sugar levels.  Get screened for cancer and depression. Make sure that you are up to date with all your vaccines. This information is not intended to replace advice given to you by your health care provider. Make sure you discuss any questions you have with your health care provider. Document Revised: 05/30/2018 Document Reviewed: 05/30/2018 Elsevier Patient Education  2020 [employer].

## 2019-09-09 DIAGNOSIS — L738 Other specified follicular disorders: Secondary | ICD-10-CM | POA: Diagnosis not present

## 2019-09-09 DIAGNOSIS — L853 Xerosis cutis: Secondary | ICD-10-CM | POA: Diagnosis not present

## 2019-09-09 DIAGNOSIS — D2261 Melanocytic nevi of right upper limb, including shoulder: Secondary | ICD-10-CM | POA: Diagnosis not present

## 2019-09-09 DIAGNOSIS — D225 Melanocytic nevi of trunk: Secondary | ICD-10-CM | POA: Diagnosis not present

## 2019-09-19 DIAGNOSIS — Z87442 Personal history of urinary calculi: Secondary | ICD-10-CM

## 2019-09-19 HISTORY — DX: Personal history of urinary calculi: Z87.442

## 2019-09-26 ENCOUNTER — Telehealth: Payer: Self-pay

## 2019-09-26 MED ORDER — ROSUVASTATIN CALCIUM 5 MG PO TABS: ORAL_TABLET | ORAL | 3 refills | Status: DC

## 2019-09-26 NOTE — Telephone Encounter (Signed)
Patient is taking rosuvastatin 3 times a week. Rx running out early bc pervious rx written for two times a week. Spoke with patient. Will send in rx for one tablet daily or as tolerated so that patient will not run out. She will try to increase as able.

## 2019-09-26 NOTE — Telephone Encounter (Signed)
Pt calling stating that she now takes rosuvastatin 5 mg tablets 3 times a week and she it tolerating this just fine and would like Marcelle Overlie, RPH, to give her a call concerning this matter. Pt's pharmacy is requesting 4 tablets a week. Please address

## 2019-10-04 DIAGNOSIS — M170 Bilateral primary osteoarthritis of knee: Secondary | ICD-10-CM | POA: Diagnosis not present

## 2019-10-11 DIAGNOSIS — M549 Dorsalgia, unspecified: Secondary | ICD-10-CM | POA: Diagnosis not present

## 2019-10-12 DIAGNOSIS — M545 Low back pain: Secondary | ICD-10-CM | POA: Diagnosis not present

## 2019-10-15 DIAGNOSIS — I2510 Atherosclerotic heart disease of native coronary artery without angina pectoris: Secondary | ICD-10-CM | POA: Diagnosis not present

## 2019-10-15 DIAGNOSIS — N200 Calculus of kidney: Secondary | ICD-10-CM | POA: Diagnosis not present

## 2019-10-15 DIAGNOSIS — K760 Fatty (change of) liver, not elsewhere classified: Secondary | ICD-10-CM | POA: Diagnosis not present

## 2019-10-15 DIAGNOSIS — M6208 Separation of muscle (nontraumatic), other site: Secondary | ICD-10-CM | POA: Diagnosis not present

## 2019-10-16 DIAGNOSIS — R42 Dizziness and giddiness: Secondary | ICD-10-CM | POA: Diagnosis not present

## 2019-10-17 DIAGNOSIS — R42 Dizziness and giddiness: Secondary | ICD-10-CM | POA: Diagnosis not present

## 2019-10-17 DIAGNOSIS — I2510 Atherosclerotic heart disease of native coronary artery without angina pectoris: Secondary | ICD-10-CM | POA: Diagnosis not present

## 2019-10-18 ENCOUNTER — Other Ambulatory Visit: Payer: Self-pay | Admitting: Cardiology

## 2019-10-24 ENCOUNTER — Telehealth: Payer: Self-pay | Admitting: Cardiology

## 2019-10-24 NOTE — Telephone Encounter (Signed)
The patient called and wanted to make sure Dr. Radford Pax has access to the records from both of the patient's CPAP machines. The patient has an appointment on Tuesday and just wanted to make sure Dr. Radford Pax will have all the information she needs

## 2019-10-29 ENCOUNTER — Other Ambulatory Visit: Payer: Self-pay

## 2019-10-29 ENCOUNTER — Encounter: Payer: Self-pay | Admitting: Cardiology

## 2019-10-29 ENCOUNTER — Ambulatory Visit (INDEPENDENT_AMBULATORY_CARE_PROVIDER_SITE_OTHER): Payer: BC Managed Care – PPO | Admitting: Cardiology

## 2019-10-29 ENCOUNTER — Other Ambulatory Visit: Payer: Self-pay | Admitting: Family Medicine

## 2019-10-29 VITALS — BP 128/78 | HR 96 | Ht 62.75 in | Wt 270.8 lb

## 2019-10-29 DIAGNOSIS — E1122 Type 2 diabetes mellitus with diabetic chronic kidney disease: Secondary | ICD-10-CM

## 2019-10-29 DIAGNOSIS — N181 Chronic kidney disease, stage 1: Secondary | ICD-10-CM

## 2019-10-29 DIAGNOSIS — G4733 Obstructive sleep apnea (adult) (pediatric): Secondary | ICD-10-CM

## 2019-10-29 DIAGNOSIS — I2510 Atherosclerotic heart disease of native coronary artery without angina pectoris: Secondary | ICD-10-CM

## 2019-10-29 DIAGNOSIS — I471 Supraventricular tachycardia: Secondary | ICD-10-CM | POA: Diagnosis not present

## 2019-10-29 DIAGNOSIS — I10 Essential (primary) hypertension: Secondary | ICD-10-CM

## 2019-10-29 DIAGNOSIS — I5032 Chronic diastolic (congestive) heart failure: Secondary | ICD-10-CM

## 2019-10-29 DIAGNOSIS — I4719 Other supraventricular tachycardia: Secondary | ICD-10-CM

## 2019-10-29 DIAGNOSIS — E785 Hyperlipidemia, unspecified: Secondary | ICD-10-CM

## 2019-10-29 DIAGNOSIS — Z9989 Dependence on other enabling machines and devices: Secondary | ICD-10-CM

## 2019-10-29 DIAGNOSIS — Z1231 Encounter for screening mammogram for malignant neoplasm of breast: Secondary | ICD-10-CM

## 2019-10-29 NOTE — Progress Notes (Addendum)
Cardiology Office Note:    Date:  10/29/2019   ID:  Jaime Nichols, DOB 03-09-1961, MRN [id number]  PCP:  Leighton Ruff, MD  Cardiologist:  Fransico Him, MD    Referring MD: Leighton Ruff, MD   Chief Complaint  Patient presents with  . Sleep Apnea  . Congestive Heart Failure  . Hypertension    History of Present Illness:    Jaime Nichols is a 59 y.o. female with a hx of OSA on CPAP, obesity, chronic diastolic CHF, PATand HTN. She has chronic SOB that is multifactorial from obesity, pectus excavatum and possible asthma, followed by Dr. Milton Ferguson.   She is here today for followup and is doing well.  She has chronic DOE that is unchanged from prior. She denies any chest pain or pressure, PND, orthopnea,  dizziness, or syncope. She has intermittent LE edema that is well controlled diuretics. Occasionally she will have some skipped heart beats.  She is compliant with her meds and is tolerating meds with no SE.    She is doing well with her CPAP device and thinks that she has gotten used to it.  She tolerates the mask and feels the pressure is adequate.  Since going on CPAP she feels rested in the am and has no significant daytime sleepiness.  She denies any significant mouth or nasal dryness or nasal congestion.  She does not think that he snores.    She was recently dx with nephrolithiasis and has had several CT scans.  Once scan at [facility] showed coronary atherosclerosis and then mentioned stent and she became concerned as she has no stent.     Past Medical History:  Diagnosis Date  . Aortic atherosclerosis ([place])   . Chronic diastolic CHF (congestive heart failure) ([place])   . Coronary artery calcification seen on CAT scan    very high calcium score at 875  . Diabetes mellitus type 2 in obese ([place])   . Fibroid   . HTN (hypertension)   . Hyperlipidemia   . Morbid obesity ([place])   . OSA on CPAP   . PAT (paroxysmal atrial tachycardia) ([place])   . Premature atrial  contractions   . PVC's (premature ventricular contractions)     Past Surgical History:  Procedure Laterality Date  . ABDOMINAL HYSTERECTOMY  2010   BSO  . Benign uterine polyps  01/2009  . DILATION AND CURETTAGE OF UTERUS    . HYSTEROSCOPY      Current Medications: Current Meds  Medication Sig  . aspirin EC 81 MG tablet Take 1 tablet (81 mg total) by mouth daily.  Marland Kitchen CLINPRO 5000 1.1 % PSTE Take 1 application by mouth 2 (two) times daily. Reported on 08/11/2015  . diltiazem (CARDIZEM CD) 120 MG 24 hr capsule TAKE 1 CAPSULE BY MOUTH EVERY DAY  . Evolocumab (REPATHA SURECLICK) XX123456 MG/ML SOAJ Inject 1 pen into the skin every 14 (fourteen) days.  . fluconazole (DIFLUCAN) 100 MG tablet Take 2 today and then 1 weekly as needed  . glimepiride (AMARYL) 4 MG tablet TAKE 1 TABLET BY MOUTH EVERY DAY BEFORE BREAKFAST  . levalbuterol (XOPENEX HFA) 45 MCG/ACT inhaler INHALE 1 PUFF INTO THE LUNGS EVERY 4 HOURS AS NEEDED  . lisinopril-hydrochlorothiazide (PRINZIDE,ZESTORETIC) 10-12.5 MG per tablet Take 1 tablet by mouth daily.   . metFORMIN (GLUCOPHAGE-XR) 500 MG 24 hr tablet TAKE 2 TABLETS BY MOUTH TWICE A DAY WITH MEALS  . metoprolol succinate (TOPROL-XL) 25 MG 24 hr tablet Take 1 tablet (25  mg total) by mouth daily. Please keep upcoming appt in May with Dr. Radford Pax for future refills. Thank you  . nystatin-triamcinolone ointment (MYCOLOG) Apply 1 application topically 2 (two) times daily.  . rosuvastatin (CRESTOR) 5 MG tablet Take one tablet by mouth daily or as tolerated  . terconazole (TERAZOL 7) 0.4 % vaginal cream Place 1 applicator vaginally at bedtime.  . TRULICITY [id number] [id number] SOPN INJECT 1 PEN UNDER THE SKIN ONCE A WEEK     Allergies:   Azithromycin, Latex, Nickel, Statins, and Erythromycin   Social History   Socioeconomic History  . Marital status: Married    Spouse name: Not on file  . Number of children: Not on file  . Years of education: Not on file  . Highest education level:  Not on file  Occupational History  . Not on file  Tobacco Use  . Smoking status: Never Smoker  . Smokeless tobacco: Never Used  Substance and Sexual Activity  . Alcohol use: Yes    Comment: occ.  . Drug use: No  . Sexual activity: Yes    Partners: Male    Birth control/protection: Surgical    Comment: hyst  Other Topics Concern  . Not on file  Social History Narrative  . Not on file   Social Determinants of Health   Financial Resource Strain:   . Difficulty of Paying Living Expenses:   Food Insecurity:   . Worried About Charity fundraiser in the Last Year:   . Arboriculturist in the Last Year:   Transportation Needs:   . Film/video editor (Medical):   Marland Kitchen Lack of Transportation (Non-Medical):   Physical Activity:   . Days of Exercise per Week:   . Minutes of Exercise per Session:   Stress:   . Feeling of Stress :   Social Connections:   . Frequency of Communication with Friends and Family:   . Frequency of Social Gatherings with Friends and Family:   . Attends Religious Services:   . Active Member of Clubs or Organizations:   . Attends Archivist Meetings:   Marland Kitchen Marital Status:      Family History: The patient's family history includes Arrhythmia in her sister and another family member; Depression in her brother; Diabetes in her paternal grandmother; Healthy in her sister and sister; Heart disease in her father; Hypertension in her mother.  ROS:   Please see the history of present illness.    ROS  All other systems reviewed and negative.   EKGs/Labs/Other Studies Reviewed:    The following studies were reviewed today: PAP compliance download  EKG:  EKG is  ordered today.  The ekg ordered today demonstrates NSR with septal infarct - unchanged from 09/18/17  Recent Labs: 02/14/2019: ALT 44   Recent Lipid Panel    Component Value Date/Time   CHOL 100 02/14/2019 1009   TRIG 132 02/14/2019 1009   HDL 42 02/14/2019 1009   CHOLHDL 2.4 02/14/2019  1009   LDLCALC 32 02/14/2019 1009    Physical Exam:    VS:  BP 128/78   Pulse 96   Ht 5' 2.75" (1.594 m)   Wt 270 lb 12.8 oz (122.8 kg)   BMI 48.35 kg/m     Wt Readings from Last 3 Encounters:  10/29/19 270 lb 12.8 oz (122.8 kg)  09/03/19 273 lb (123.8 kg)  01/23/19 275 lb (124.7 kg)     GEN:  Well nourished, well developed in no acute  distress HEENT: Normal NECK: No JVD; No carotid bruits LYMPHATICS: No lymphadenopathy CARDIAC: RRR, no murmurs, rubs, gallops RESPIRATORY:  Clear to auscultation without rales, wheezing or rhonchi  ABDOMEN: Soft, non-tender, non-distended MUSCULOSKELETAL:  No edema; No deformity  SKIN: Warm and dry NEUROLOGIC:  Alert and oriented x 3 PSYCHIATRIC:  Normal affect   ASSESSMENT:    1. Chronic diastolic CHF (congestive heart failure) ([place])   2. Essential hypertension   3. PAT (paroxysmal atrial tachycardia) ([place])   4. OSA on CPAP   5. Morbid obesity ([place])   6. Controlled type 2 diabetes mellitus with stage 1 chronic kidney disease, without long-term current use of insulin ([place])   7. Coronary artery calcification seen on CAT scan   8. Hyperlipidemia, unspecified hyperlipidemia type    PLAN:    In order of problems listed above:  1.  Chronic diastolic CHF  -she appears euvolemic on exam today -she has chronic SOB related to allergies, Obesity and ashtma -her weight remains stable -continue diuretics  2.  Hypertension  -BP controlled on exam -continue on Toprol XL 25mg  daily, Cardizem CD 120mg  daily and Lisinopril 10-12.5mg  daily -creatinine was stable at 0.82 and K+ 4.6 last month  3.  PAT  -denies any significant palpitations -continue Cardizem CD 120mg  daily, Toprol XL 25mg  daily  4.  OSA - The patient is tolerating PAP therapy well without any problems. The PAP download was reviewed today and showed an AHI of 0.5/hr on 12.6 cm H2O with 17% compliance in using more than 4 hours nightly.  The patient has been using and  benefiting from PAP use and will continue to benefit from therapy.  -her compliance on the one report is down because she uses one device at home and [place] and I do not have the report on the second device. She says she uses it every night  5.  Morbid Obesity  -I have encouraged her to get into a routine exercise program and cut back on carbs and portions.   6. Type 2 diabetes mellitus  - this is followed by her PCP.  -Her last hemoglobin A1c was 7.1 on 07/12/2019 -continue Metformin XR 500 mg daily 2 tablets twice daily and glimepiride 4 mg 1/2 tablet daily.  7. ASCAD -denies any anginal sx -coronary CTA a year ago showed 30% RCA and LAD.   -I reviewed the abdominal CTA that was done recently at [facility] for her kidney stones that shows coronary atherosclerosis and reportedly a stent.  She has not had any coronary stent and coronary CTA a year ago showed only nonobstructive disease.  I reassured her that sometimes the calcium looks like a stent.  She has not had any anginal sx and chronic DOE has not changed.  -continue ASA and statin -Nuclear stress test 2019 showed no ischemia.    8.  Hyperlipidemia  -her LDL goal is less than 70.   -She does not tolerate higher doses of statins.   -continue Crestor 3 times weekly at 20mg  and Repatha -LDL was 32 in Aug 2020   Medication Adjustments/Labs and Tests Ordered: Current medicines are reviewed at length with the patient today.  Concerns regarding medicines are outlined above.  Orders Placed This Encounter  Procedures  . EKG 12-Lead   No orders of the defined types were placed in this encounter.   Signed, Fransico Him, MD  10/29/2019 11:34 AM    [place]

## 2019-10-29 NOTE — Telephone Encounter (Signed)
Return call: Patient will download the my air app and get a download for Dr Radford Pax.

## 2019-10-29 NOTE — Patient Instructions (Signed)

## 2019-10-30 DIAGNOSIS — N200 Calculus of kidney: Secondary | ICD-10-CM | POA: Diagnosis not present

## 2019-11-11 DIAGNOSIS — Z7982 Long term (current) use of aspirin: Secondary | ICD-10-CM | POA: Diagnosis not present

## 2019-11-11 DIAGNOSIS — I509 Heart failure, unspecified: Secondary | ICD-10-CM | POA: Diagnosis not present

## 2019-11-11 DIAGNOSIS — Z7984 Long term (current) use of oral hypoglycemic drugs: Secondary | ICD-10-CM | POA: Diagnosis not present

## 2019-11-11 DIAGNOSIS — Z8249 Family history of ischemic heart disease and other diseases of the circulatory system: Secondary | ICD-10-CM | POA: Diagnosis not present

## 2019-11-11 DIAGNOSIS — E119 Type 2 diabetes mellitus without complications: Secondary | ICD-10-CM | POA: Diagnosis not present

## 2019-11-11 DIAGNOSIS — G4733 Obstructive sleep apnea (adult) (pediatric): Secondary | ICD-10-CM | POA: Diagnosis not present

## 2019-11-11 DIAGNOSIS — Z6841 Body Mass Index (BMI) 40.0 and over, adult: Secondary | ICD-10-CM | POA: Diagnosis not present

## 2019-11-11 DIAGNOSIS — K760 Fatty (change of) liver, not elsewhere classified: Secondary | ICD-10-CM | POA: Diagnosis not present

## 2019-11-11 DIAGNOSIS — I2510 Atherosclerotic heart disease of native coronary artery without angina pectoris: Secondary | ICD-10-CM | POA: Diagnosis not present

## 2019-11-11 DIAGNOSIS — Z881 Allergy status to other antibiotic agents status: Secondary | ICD-10-CM | POA: Diagnosis not present

## 2019-11-11 DIAGNOSIS — Z79899 Other long term (current) drug therapy: Secondary | ICD-10-CM | POA: Diagnosis not present

## 2019-11-11 DIAGNOSIS — N200 Calculus of kidney: Secondary | ICD-10-CM | POA: Diagnosis not present

## 2019-11-11 DIAGNOSIS — Z91040 Latex allergy status: Secondary | ICD-10-CM | POA: Diagnosis not present

## 2019-11-11 DIAGNOSIS — Z888 Allergy status to other drugs, medicaments and biological substances status: Secondary | ICD-10-CM | POA: Diagnosis not present

## 2019-11-11 DIAGNOSIS — Z800 Family history of malignant neoplasm of digestive organs: Secondary | ICD-10-CM | POA: Diagnosis not present

## 2019-11-27 DIAGNOSIS — N200 Calculus of kidney: Secondary | ICD-10-CM | POA: Diagnosis not present

## 2019-12-05 DIAGNOSIS — M170 Bilateral primary osteoarthritis of knee: Secondary | ICD-10-CM | POA: Diagnosis not present

## 2019-12-05 DIAGNOSIS — H10412 Chronic giant papillary conjunctivitis, left eye: Secondary | ICD-10-CM | POA: Diagnosis not present

## 2019-12-10 DIAGNOSIS — N200 Calculus of kidney: Secondary | ICD-10-CM | POA: Diagnosis not present

## 2019-12-22 ENCOUNTER — Other Ambulatory Visit: Payer: Self-pay | Admitting: Physician Assistant

## 2019-12-27 DIAGNOSIS — N200 Calculus of kidney: Secondary | ICD-10-CM | POA: Diagnosis not present

## 2020-01-02 DIAGNOSIS — E538 Deficiency of other specified B group vitamins: Secondary | ICD-10-CM | POA: Diagnosis not present

## 2020-01-02 DIAGNOSIS — N209 Urinary calculus, unspecified: Secondary | ICD-10-CM | POA: Diagnosis not present

## 2020-01-02 DIAGNOSIS — E119 Type 2 diabetes mellitus without complications: Secondary | ICD-10-CM | POA: Diagnosis not present

## 2020-01-02 DIAGNOSIS — N200 Calculus of kidney: Secondary | ICD-10-CM | POA: Diagnosis not present

## 2020-01-02 DIAGNOSIS — R161 Splenomegaly, not elsewhere classified: Secondary | ICD-10-CM | POA: Diagnosis not present

## 2020-01-06 DIAGNOSIS — H10412 Chronic giant papillary conjunctivitis, left eye: Secondary | ICD-10-CM | POA: Diagnosis not present

## 2020-01-08 DIAGNOSIS — H02844 Edema of left upper eyelid: Secondary | ICD-10-CM | POA: Diagnosis not present

## 2020-01-08 DIAGNOSIS — H02841 Edema of right upper eyelid: Secondary | ICD-10-CM | POA: Diagnosis not present

## 2020-01-08 DIAGNOSIS — H10413 Chronic giant papillary conjunctivitis, bilateral: Secondary | ICD-10-CM | POA: Diagnosis not present

## 2020-01-08 DIAGNOSIS — H01114 Allergic dermatitis of left upper eyelid: Secondary | ICD-10-CM | POA: Diagnosis not present

## 2020-01-11 ENCOUNTER — Other Ambulatory Visit: Payer: Self-pay | Admitting: Cardiology

## 2020-01-13 DIAGNOSIS — R399 Unspecified symptoms and signs involving the genitourinary system: Secondary | ICD-10-CM | POA: Diagnosis not present

## 2020-01-13 DIAGNOSIS — R32 Unspecified urinary incontinence: Secondary | ICD-10-CM | POA: Diagnosis not present

## 2020-01-17 DIAGNOSIS — M170 Bilateral primary osteoarthritis of knee: Secondary | ICD-10-CM | POA: Diagnosis not present

## 2020-01-20 DIAGNOSIS — R7989 Other specified abnormal findings of blood chemistry: Secondary | ICD-10-CM | POA: Diagnosis not present

## 2020-01-20 DIAGNOSIS — L858 Other specified epidermal thickening: Secondary | ICD-10-CM | POA: Diagnosis not present

## 2020-01-20 DIAGNOSIS — E119 Type 2 diabetes mellitus without complications: Secondary | ICD-10-CM | POA: Diagnosis not present

## 2020-01-20 DIAGNOSIS — L249 Irritant contact dermatitis, unspecified cause: Secondary | ICD-10-CM | POA: Diagnosis not present

## 2020-01-20 DIAGNOSIS — L603 Nail dystrophy: Secondary | ICD-10-CM | POA: Diagnosis not present

## 2020-01-21 DIAGNOSIS — E538 Deficiency of other specified B group vitamins: Secondary | ICD-10-CM | POA: Diagnosis not present

## 2020-01-24 DIAGNOSIS — R399 Unspecified symptoms and signs involving the genitourinary system: Secondary | ICD-10-CM | POA: Diagnosis not present

## 2020-02-06 DIAGNOSIS — R300 Dysuria: Secondary | ICD-10-CM | POA: Diagnosis not present

## 2020-02-12 ENCOUNTER — Encounter: Payer: Self-pay | Admitting: Nurse Practitioner

## 2020-02-12 ENCOUNTER — Other Ambulatory Visit: Payer: Self-pay

## 2020-02-12 ENCOUNTER — Ambulatory Visit (INDEPENDENT_AMBULATORY_CARE_PROVIDER_SITE_OTHER): Payer: BC Managed Care – PPO | Admitting: Nurse Practitioner

## 2020-02-12 DIAGNOSIS — N898 Other specified noninflammatory disorders of vagina: Secondary | ICD-10-CM | POA: Diagnosis not present

## 2020-02-12 DIAGNOSIS — R300 Dysuria: Secondary | ICD-10-CM

## 2020-02-12 LAB — NO CULTURE INDICATED

## 2020-02-12 LAB — URINALYSIS, COMPLETE W/RFL CULTURE
Bacteria, UA: NONE SEEN /HPF
Bilirubin Urine: NEGATIVE
Glucose, UA: NEGATIVE
Hgb urine dipstick: NEGATIVE
Hyaline Cast: NONE SEEN /LPF
Ketones, ur: NEGATIVE
Leukocyte Esterase: NEGATIVE
Nitrites, Initial: NEGATIVE
Protein, ur: NEGATIVE
RBC / HPF: NONE SEEN /HPF (ref 0–2)
Specific Gravity, Urine: 1.015 (ref 1.001–1.03)
WBC, UA: NONE SEEN /HPF (ref 0–5)
pH: 5.5 (ref 5.0–8.0)

## 2020-02-12 LAB — WET PREP FOR TRICH, YEAST, CLUE

## 2020-02-12 MED ORDER — CLOBETASOL PROPIONATE 0.05 % EX OINT: 1.0000 "application " | TOPICAL_OINTMENT | Freq: Two times a day (BID) | CUTANEOUS | 1 refills | Status: AC

## 2020-02-12 NOTE — Progress Notes (Signed)
   Acute Office Visit  Subjective:    Patient ID: Jaime Nichols, female    DOB: 1961/02/27, 59 y.o.   MRN: [id number]   HPI 59 y.o. presents today for burning with urination.  This is not new for her and comes and goes.  History of recurrent UTIs, sees urology for this, just finished up a course of Cipro. Cystoscopy with bladder stent placement and lithotripsy in June 2021.  Stone composition 100% uric acid and was started on potassium citrate.  History of urinary incontinence and frequent yeast infections.    Review of Systems  Constitutional: Negative.   Genitourinary: Positive for dysuria and vaginal pain (irritation/burning with urination). Negative for flank pain, frequency, hematuria, urgency and vaginal discharge.       Objective:    Physical Exam Constitutional:      Appearance: Normal appearance. She is obese.  Genitourinary:    Vagina: No vaginal discharge, erythema, tenderness or bleeding.     Comments: Generalized eythema    There were no vitals taken for this visit. Wt Readings from Last 3 Encounters:  10/29/19 270 lb 12.8 oz (122.8 kg)  09/03/19 273 lb (123.8 kg)  01/23/19 275 lb (124.7 kg)        Assessment & Plan:   Problem List Items Addressed This Visit    None    Visit Diagnoses    Dysuria    -  Primary   Relevant Orders   Urinalysis,Complete w/RFL Culture   Vaginal irritation       Relevant Medications   clobetasol ointment (TEMOVATE) 0.05 %   Other Relevant Orders   WET PREP FOR [place], YEAST, CLUE     Plan: UA and wet prep unremarkable.  Mild generalized vulvar erythema most likely a result of constant urine leakage.  Clobetasol 0.05% applied twice a day for 14 days and then as needed for irritation. Keep area dry as much as possible. Return if symptoms worsen or do not improve. She is agreeable to plan.      Tamela Gammon [facility], 12:45 PM 02/12/2020

## 2020-02-13 DIAGNOSIS — R399 Unspecified symptoms and signs involving the genitourinary system: Secondary | ICD-10-CM | POA: Diagnosis not present

## 2020-02-14 DIAGNOSIS — R399 Unspecified symptoms and signs involving the genitourinary system: Secondary | ICD-10-CM | POA: Diagnosis not present

## 2020-02-17 DIAGNOSIS — I10 Essential (primary) hypertension: Secondary | ICD-10-CM | POA: Diagnosis not present

## 2020-02-17 DIAGNOSIS — E790 Hyperuricemia without signs of inflammatory arthritis and tophaceous disease: Secondary | ICD-10-CM | POA: Diagnosis not present

## 2020-02-17 DIAGNOSIS — N200 Calculus of kidney: Secondary | ICD-10-CM | POA: Diagnosis not present

## 2020-02-17 DIAGNOSIS — N181 Chronic kidney disease, stage 1: Secondary | ICD-10-CM | POA: Diagnosis not present

## 2020-02-25 ENCOUNTER — Telehealth: Payer: Self-pay | Admitting: *Deleted

## 2020-02-25 ENCOUNTER — Other Ambulatory Visit: Payer: Self-pay | Admitting: Nurse Practitioner

## 2020-02-25 DIAGNOSIS — B3731 Acute candidiasis of vulva and vagina: Secondary | ICD-10-CM

## 2020-02-25 DIAGNOSIS — B373 Candidiasis of vulva and vagina: Secondary | ICD-10-CM

## 2020-02-25 DIAGNOSIS — E538 Deficiency of other specified B group vitamins: Secondary | ICD-10-CM | POA: Diagnosis not present

## 2020-02-25 MED ORDER — FLUCONAZOLE 150 MG PO TABS: 150.0000 mg | ORAL_TABLET | ORAL | 0 refills | Status: DC

## 2020-02-25 NOTE — Telephone Encounter (Signed)
Patient called c/o same symptoms from [place] on 02/12/20 still using clobetasol ointment 0.05% has not helped at all with symptoms. She asked if you have any other recommendations?

## 2020-02-25 NOTE — Telephone Encounter (Signed)
Let's try Diflucan 150 mg. Take 1 tablet today and repeat in 3 days for total of 2 doses. I will send this in. Thank you.

## 2020-02-25 NOTE — Telephone Encounter (Signed)
Patient informed. 

## 2020-02-26 DIAGNOSIS — N200 Calculus of kidney: Secondary | ICD-10-CM | POA: Diagnosis not present

## 2020-02-26 DIAGNOSIS — K760 Fatty (change of) liver, not elsewhere classified: Secondary | ICD-10-CM | POA: Diagnosis not present

## 2020-02-28 DIAGNOSIS — N390 Urinary tract infection, site not specified: Secondary | ICD-10-CM | POA: Diagnosis not present

## 2020-02-28 DIAGNOSIS — N3281 Overactive bladder: Secondary | ICD-10-CM | POA: Diagnosis not present

## 2020-02-28 DIAGNOSIS — N200 Calculus of kidney: Secondary | ICD-10-CM | POA: Diagnosis not present

## 2020-02-28 DIAGNOSIS — R300 Dysuria: Secondary | ICD-10-CM | POA: Diagnosis not present

## 2020-03-02 DIAGNOSIS — N3941 Urge incontinence: Secondary | ICD-10-CM | POA: Diagnosis not present

## 2020-03-02 DIAGNOSIS — R102 Pelvic and perineal pain: Secondary | ICD-10-CM | POA: Diagnosis not present

## 2020-03-02 DIAGNOSIS — N200 Calculus of kidney: Secondary | ICD-10-CM | POA: Diagnosis not present

## 2020-03-02 DIAGNOSIS — R3915 Urgency of urination: Secondary | ICD-10-CM | POA: Diagnosis not present

## 2020-03-16 ENCOUNTER — Emergency Department (HOSPITAL_COMMUNITY)
Admission: EM | Admit: 2020-03-16 | Discharge: 2020-03-17 | Disposition: A | Discharge status: Home / Self Care | Payer: BC Managed Care – PPO | Attending: Emergency Medicine | Admitting: Emergency Medicine

## 2020-03-16 ENCOUNTER — Encounter (HOSPITAL_COMMUNITY): Payer: Self-pay | Admitting: Emergency Medicine

## 2020-03-16 ENCOUNTER — Emergency Department (HOSPITAL_COMMUNITY): Payer: BC Managed Care – PPO

## 2020-03-16 ENCOUNTER — Telehealth: Payer: Self-pay | Admitting: Physician Assistant

## 2020-03-16 DIAGNOSIS — R0602 Shortness of breath: Secondary | ICD-10-CM | POA: Insufficient documentation

## 2020-03-16 DIAGNOSIS — R0600 Dyspnea, unspecified: Secondary | ICD-10-CM | POA: Diagnosis not present

## 2020-03-16 DIAGNOSIS — Z5321 Procedure and treatment not carried out due to patient leaving prior to being seen by health care provider: Secondary | ICD-10-CM | POA: Diagnosis not present

## 2020-03-16 DIAGNOSIS — I10 Essential (primary) hypertension: Secondary | ICD-10-CM | POA: Diagnosis not present

## 2020-03-16 DIAGNOSIS — R609 Edema, unspecified: Secondary | ICD-10-CM | POA: Diagnosis not present

## 2020-03-16 DIAGNOSIS — I2510 Atherosclerotic heart disease of native coronary artery without angina pectoris: Secondary | ICD-10-CM | POA: Diagnosis not present

## 2020-03-16 DIAGNOSIS — I5032 Chronic diastolic (congestive) heart failure: Secondary | ICD-10-CM | POA: Diagnosis not present

## 2020-03-16 DIAGNOSIS — R0789 Other chest pain: Secondary | ICD-10-CM | POA: Diagnosis not present

## 2020-03-16 LAB — COMPREHENSIVE METABOLIC PANEL
ALT: 31 U/L (ref 0–44)
AST: 24 U/L (ref 15–41)
Albumin: 3.9 g/dL (ref 3.5–5.0)
Alkaline Phosphatase: 74 U/L (ref 38–126)
Anion gap: 10 (ref 5–15)
BUN: 9 mg/dL (ref 6–20)
CO2: 27 mmol/L (ref 22–32)
Calcium: 9.3 mg/dL (ref 8.9–10.3)
Chloride: 106 mmol/L (ref 98–111)
Creatinine, Ser: 0.74 mg/dL (ref 0.44–1.00)
GFR calc Af Amer: >60 mL/min (ref >60)
GFR calc non Af Amer: >60 mL/min (ref >60)
Glucose, Bld: 86 mg/dL (ref 70–99)
Potassium: 4.1 mmol/L (ref 3.5–5.1)
Sodium: 143 mmol/L (ref 135–145)
Total Bilirubin: 0.7 mg/dL (ref 0.3–1.2)
Total Protein: 7.2 g/dL (ref 6.5–8.1)

## 2020-03-16 LAB — CBC
HCT: 40.9 % (ref 36.0–46.0)
Hemoglobin: 12.7 g/dL (ref 12.0–15.0)
MCH: 28.3 pg (ref 26.0–34.0)
MCHC: 31.1 g/dL (ref 30.0–36.0)
MCV: 91.1 fL (ref 80.0–100.0)
Platelets: 294 10*3/uL (ref 150–400)
RBC: 4.49 MIL/uL (ref 3.87–5.11)
RDW: 13.6 % (ref 11.5–15.5)
WBC: 9.4 10*3/uL (ref 4.0–10.5)
nRBC: 0 % (ref 0.0–0.2)

## 2020-03-16 LAB — TROPONIN I (HIGH SENSITIVITY): Troponin I (High Sensitivity): <2 ng/L (ref <18)

## 2020-03-16 NOTE — ED Triage Notes (Signed)
Pt here from MD for sob while walking , pt recently taken off her hctz and since that time has been sob , 98 % resting on room air , nad

## 2020-03-16 NOTE — Telephone Encounter (Signed)
   The patient called the answering service after-hours today. She states she was seen by her GP earlier today for increased dyspnea and was markedly SOB with minimal movement there, prompting her GP to send her to the ER for evaluation. She has had a long wait so far (arrive 80) and is inquiring if it is safe to leave. Initial troponin, BMET, CBC, and CXR OK but without clinical evaluation I do not think it is wise for her to do so especially if another provider felt her dyspnea was severe enough to warrant emergency room evaluation. We talked about how we cannot definitively evaluate her SOB over the phone. The patient understands importance of staying for evaluation and verbalized understanding and gratitude.  Charlie Pitter, PA-C

## 2020-03-17 ENCOUNTER — Encounter: Payer: Self-pay | Admitting: Physician Assistant

## 2020-03-17 NOTE — Telephone Encounter (Signed)
Error

## 2020-03-17 NOTE — ED Notes (Signed)
Pt called for repeat troponin, no response.

## 2020-03-17 NOTE — ED Notes (Signed)
Pt called 3x no response  

## 2020-03-23 ENCOUNTER — Encounter: Payer: Self-pay | Admitting: Cardiology

## 2020-03-23 ENCOUNTER — Other Ambulatory Visit: Payer: Self-pay

## 2020-03-23 ENCOUNTER — Ambulatory Visit (INDEPENDENT_AMBULATORY_CARE_PROVIDER_SITE_OTHER): Payer: BC Managed Care – PPO | Admitting: Cardiology

## 2020-03-23 VITALS — BP 134/68 | HR 86 | Ht 62.75 in | Wt 273.8 lb

## 2020-03-23 DIAGNOSIS — Z9989 Dependence on other enabling machines and devices: Secondary | ICD-10-CM

## 2020-03-23 DIAGNOSIS — I10 Essential (primary) hypertension: Secondary | ICD-10-CM | POA: Diagnosis not present

## 2020-03-23 DIAGNOSIS — I2510 Atherosclerotic heart disease of native coronary artery without angina pectoris: Secondary | ICD-10-CM

## 2020-03-23 DIAGNOSIS — I5032 Chronic diastolic (congestive) heart failure: Secondary | ICD-10-CM

## 2020-03-23 DIAGNOSIS — G4733 Obstructive sleep apnea (adult) (pediatric): Secondary | ICD-10-CM | POA: Diagnosis not present

## 2020-03-23 DIAGNOSIS — I471 Supraventricular tachycardia: Secondary | ICD-10-CM

## 2020-03-23 DIAGNOSIS — N181 Chronic kidney disease, stage 1: Secondary | ICD-10-CM

## 2020-03-23 DIAGNOSIS — R072 Precordial pain: Secondary | ICD-10-CM

## 2020-03-23 DIAGNOSIS — E1122 Type 2 diabetes mellitus with diabetic chronic kidney disease: Secondary | ICD-10-CM

## 2020-03-23 DIAGNOSIS — E7800 Pure hypercholesterolemia, unspecified: Secondary | ICD-10-CM

## 2020-03-23 NOTE — Patient Instructions (Signed)
Medication Instructions:  Your physician recommends that you continue on your current medications as directed. Please refer to the Current Medication list given to you today.  *If you need a refill on your cardiac medications before your next appointment, please call your pharmacy*   Lab Work: TODAY: BNP If you have labs (blood work) drawn today and your tests are completely normal, you will receive your results only by: Marland Kitchen MyChart Message (if you have MyChart) OR . A paper copy in the mail If you have any lab test that is abnormal or we need to change your treatment, we will call you to review the results.   Testing/Procedures: Your physician has requested that you have a lexiscan myoview. For further information please visit [email or web address]. Please follow instruction sheet, as given.  Your physician has recommended that you wear an event monitor. Event monitors are medical devices that record the heart's electrical activity. Doctors most often [place] these monitors to diagnose arrhythmias. Arrhythmias are problems with the speed or rhythm of the heartbeat. The monitor is a small, portable device. You can wear one while you do your normal daily activities. This is usually used to diagnose what is causing palpitations/syncope (passing out).  Follow-Up: At [facility], you and your health needs are our priority.  As part of our continuing mission to provide you with exceptional heart care, we have created designated Provider Care Teams.  These Care Teams include your primary Cardiologist (physician) and Advanced Practice Providers (APPs -  Physician Assistants and Nurse Practitioners) who all work together to provide you with the care you need, when you need it.  Your next appointment:   1 year(s)  The format for your next appointment:   In Person  Provider:   You may see Fransico Him, MD or one of the following Advanced Practice Providers on your designated Care Team:    Melina Copa,  PA-C  Ermalinda Barrios, PA-C

## 2020-03-23 NOTE — Addendum Note (Signed)
Addended by: Antonieta Iba on: 03/23/2020 02:07 PM   Modules accepted: Orders

## 2020-03-23 NOTE — Progress Notes (Signed)
Cardiology Office Note:    Date:  03/23/2020   ID:  Jaime Nichols, DOB Nov 16, 1960, MRN [id number]  PCP:  Leighton Ruff, MD  Cardiologist:  Fransico Him, MD    Referring MD: Leighton Ruff, MD   Chief Complaint  Patient presents with  . Coronary Artery Disease  . Hypertension  . Hyperlipidemia  . Sleep Apnea  . Congestive Heart Failure    History of Present Illness:    Jaime Nichols is a 59 y.o. female with a hx of OSA on CPAP, obesity, chronic diastolic CHF, PATand HTN. She has chronic SOB that is multifactorial from obesity, pectus excavatum and possible asthma, followed by Dr. Milton Nichols.   Since I saw her last she has developed some uric acid kidney stones and was taken off of her HCTZ.  She has had some problems with DOE and actually went to the ER but left after waiting a long time.  Labs were done at that time and hsTrop was normal at < 2.    She is here today for followup and is doing well.  She has chronic DOE that is unchanged from prior. She has been having problems with pressure in her chest like it is being constricted under her throat and also has still has DOE.  She denies PND, orthopnea,  dizziness, or syncope. Since going off of HCTZ her weight has been trending upward and she feels full of fluid.  She has also noticed skipping and racing of her heart at times. She is compliant with her meds and is tolerating meds with no SE.    She is doing well with her CPAP device and thinks that she has gotten used to it.  She tolerates the mask and feels the pressure is adequate.  Since going on CPAP she feels rested in the am and has no significant daytime sleepiness.  She denies any significant mouth or nasal dryness or nasal congestion.  She does not think that he snores.    She was recently dx with nephrolithiasis and has had several CT scans.  Once scan at [facility] showed coronary atherosclerosis and then mentioned stent and she became concerned as she has no  stent.     Past Medical History:  Diagnosis Date  . Aortic atherosclerosis ([place])   . Chronic diastolic CHF (congestive heart failure) ([place])   . Coronary artery calcification seen on CAT scan    very high calcium score at 875  . Diabetes mellitus type 2 in obese ([place])   . Fibroid   . HTN (hypertension)   . Hyperlipidemia   . Morbid obesity ([place])   . OSA on CPAP   . PAT (paroxysmal atrial tachycardia) ([place])   . Premature atrial contractions   . PVC's (premature ventricular contractions)     Past Surgical History:  Procedure Laterality Date  . ABDOMINAL HYSTERECTOMY  2010   BSO  . Benign uterine polyps  01/2009  . DILATION AND CURETTAGE OF UTERUS    . HYSTEROSCOPY      Current Medications: Current Meds  Medication Sig  . aspirin EC 81 MG tablet Take 1 tablet (81 mg total) by mouth daily.  Marland Kitchen CLINPRO 5000 1.1 % PSTE Take 1 application by mouth 2 (two) times daily. Reported on 08/11/2015  . diltiazem (CARDIZEM CD) 120 MG 24 hr capsule TAKE 1 CAPSULE BY MOUTH EVERY DAY  . Evolocumab (REPATHA SURECLICK) 287 MG/ML SOAJ Inject 1 pen into the skin every 14 (fourteen) days.  Marland Kitchen  glimepiride (AMARYL) 4 MG tablet TAKE 1 TABLET BY MOUTH EVERY DAY BEFORE BREAKFAST  . levalbuterol (XOPENEX HFA) 45 MCG/ACT inhaler INHALE 1 PUFF INTO THE LUNGS EVERY 4 HOURS AS NEEDED  . lisinopril (ZESTRIL) 10 MG tablet Take 10 mg by mouth daily.  . metFORMIN (GLUCOPHAGE-XR) 500 MG 24 hr tablet TAKE 2 TABLETS BY MOUTH TWICE A DAY WITH MEALS  . metoprolol succinate (TOPROL-XL) 25 MG 24 hr tablet Take 1 tablet (25 mg total) by mouth daily.  Marland Kitchen POTASSIUM CITRATE PO Take by mouth.  . rosuvastatin (CRESTOR) 5 MG tablet Take one tablet by mouth daily or as tolerated  . TRULICITY 4.66 ZL/9.3TT SOPN INJECT 1 PEN UNDER THE SKIN ONCE A WEEK     Allergies:   Erythromycin, Latex, Nickel, and Statins   Social History   Socioeconomic History  . Marital status: Married    Spouse name: Not on file  . Number of children:  Not on file  . Years of education: Not on file  . Highest education level: Not on file  Occupational History  . Not on file  Tobacco Use  . Smoking status: Never Smoker  . Smokeless tobacco: Never Used  Vaping Use  . Vaping Use: Never used  Substance and Sexual Activity  . Alcohol use: Yes    Comment: occ.  . Drug use: No  . Sexual activity: Yes    Partners: Male    Birth control/protection: Surgical    Comment: hyst  Other Topics Concern  . Not on file  Social History Narrative  . Not on file   Social Determinants of Health   Financial Resource Strain:   . Difficulty of Paying Living Expenses: Not on file  Food Insecurity:   . Worried About Charity fundraiser in the Last Year: Not on file  . Ran Out of Food in the Last Year: Not on file  Transportation Needs:   . Lack of Transportation (Medical): Not on file  . Lack of Transportation (Non-Medical): Not on file  Physical Activity:   . Days of Exercise per Week: Not on file  . Minutes of Exercise per Session: Not on file  Stress:   . Feeling of Stress : Not on file  Social Connections:   . Frequency of Communication with Friends and Family: Not on file  . Frequency of Social Gatherings with Friends and Family: Not on file  . Attends Religious Services: Not on file  . Active Member of Clubs or Organizations: Not on file  . Attends Archivist Meetings: Not on file  . Marital Status: Not on file     Family History: The patient's family history includes Arrhythmia in her sister and another family member; Depression in her brother; Diabetes in her paternal grandmother; Healthy in her sister and sister; Heart disease in her father; Hypertension in her mother.  ROS:   Please see the history of present illness.    ROS  All other systems reviewed and negative.   EKGs/Labs/Other Studies Reviewed:    The following studies were reviewed today: PAP compliance download  EKG:  EKG is  ordered today.  The ekg  ordered today demonstrates NSR with septal infarct - unchanged from 09/18/17  Recent Labs: 03/16/2020: ALT 31; BUN 9; Creatinine, Ser 0.74; Hemoglobin 12.7; Platelets 294; Potassium 4.1; Sodium 143   Recent Lipid Panel    Component Value Date/Time   CHOL 100 02/14/2019 1009   TRIG 132 02/14/2019 1009   HDL 42  02/14/2019 1009   CHOLHDL 2.4 02/14/2019 1009   LDLCALC 32 02/14/2019 1009    Physical Exam:    VS:  BP 134/68   Pulse 86   Ht 5' 2.75" (1.594 m)   Wt 273 lb 12.8 oz (124.2 kg)   SpO2 99%   BMI 48.89 kg/m     Wt Readings from Last 3 Encounters:  03/23/20 273 lb 12.8 oz (124.2 kg)  10/29/19 270 lb 12.8 oz (122.8 kg)  09/03/19 273 lb (123.8 kg)     GEN:  Well nourished, well developed in no acute distress HEENT: Normal NECK: No JVD; No carotid bruits LYMPHATICS: No lymphadenopathy CARDIAC: RRR, no murmurs, rubs, gallops RESPIRATORY:  Clear to auscultation without rales, wheezing or rhonchi  ABDOMEN: Soft, non-tender, non-distended MUSCULOSKELETAL:  2+ pitting Left ankle edema from injury this weekend; No deformity  SKIN: Warm and dry NEUROLOGIC:  Alert and oriented x 3 PSYCHIATRIC:  Normal affect   ASSESSMENT:    1. Chronic diastolic CHF (congestive heart failure) ([place])   2. Primary hypertension   3. PAT (paroxysmal atrial tachycardia) ([place])   4. OSA on CPAP   5. Morbid obesity ([place])   6. Controlled type 2 diabetes mellitus with stage 1 chronic kidney disease, without long-term current use of insulin ([place])   7. Coronary artery disease involving native coronary artery of native heart without angina pectoris   8. Pure hypercholesterolemia    PLAN:    In order of problems listed above:  1.  Chronic diastolic CHF  -she appears euvolemic on exam today -she has chronic SOB related to allergies, Obesity and asthma that she thinks is worse and she thinks that she has been gaining fluid all over her body -I think a large part of her DOE is related to obesity and  deconditioning -her weight is stable  -Check BNP to see if she is volume overloaded -I told her that she needs to discuss the diuretics with her nephrologist as many of the diuretics should not be used with renal stones  2.  Hypertension  -Bp controlled -continue on Toprol XL 25mg  daily, Cardizem CD 120mg  daily and Lisinopril 10mg  daily -creatinine was stable at 0.74 and K+ 4.1 in Sept 2021  3.  PAT  -she has had some palpitations so I will repeat a 30 day event monitor -continue Cardizem CD 120mg  daily, Toprol XL 25mg  daily  4.  OSA -  The patient is tolerating PAP therapy well without any problems. The PAP download was reviewed today and showed an AHI of 0.8/hr on 12.6 cm H2O with 30% compliance in using more than 4 hours nightly.  The patient has been using and benefiting from PAP use and will continue to benefit from therapy.  -she has one device at the [facility] and 1 at home so we can only see one of her devices -she says that she uses her device every night.  5.  Morbid Obesity  -I have encouraged her to get into a routine exercise program and cut back on carbs and portions.   6. Type 2 diabetes mellitus  - this is followed by her PCP.  -Her last hemoglobin A1c was 6.3 on 07/12/2019 -continue Metformin XR 500 mg daily 2 tablets twice daily and glimepiride 4 mg 1/2 tablet daily.  7. ASCAD -she has been having chest pressure recently under her throat -coronary CTA  showed 30% RCA and LAD.   -continue ASA and statin -Nuclear stress test 2019 showed no  ischemia.   -I will repeat a lexiscan myoview to rule out ischemia  8.  Hyperlipidemia  -her LDL goal is less than 70.   -LDL was 27 in April 2021 -She does not tolerate higher doses of statins.   -continue Crestor 3 times weekly at 20mg  and Repatha    Medication Adjustments/Labs and Tests Ordered: Current medicines are reviewed at length with the patient today.  Concerns regarding medicines are outlined above.  No  orders of the defined types were placed in this encounter.  No orders of the defined types were placed in this encounter.   Signed, Fransico Him, MD  03/23/2020 1:49 PM    [facility]

## 2020-03-24 LAB — PRO B NATRIURETIC PEPTIDE: NT-Pro BNP: 120 pg/mL (ref 0–287)

## 2020-03-26 DIAGNOSIS — Z23 Encounter for immunization: Secondary | ICD-10-CM | POA: Diagnosis not present

## 2020-03-27 DIAGNOSIS — M19072 Primary osteoarthritis, left ankle and foot: Secondary | ICD-10-CM | POA: Diagnosis not present

## 2020-03-27 DIAGNOSIS — M79672 Pain in left foot: Secondary | ICD-10-CM | POA: Diagnosis not present

## 2020-03-31 ENCOUNTER — Encounter (INDEPENDENT_AMBULATORY_CARE_PROVIDER_SITE_OTHER): Payer: BC Managed Care – PPO

## 2020-03-31 DIAGNOSIS — I471 Supraventricular tachycardia: Secondary | ICD-10-CM | POA: Diagnosis not present

## 2020-03-31 DIAGNOSIS — I4719 Other supraventricular tachycardia: Secondary | ICD-10-CM

## 2020-03-31 DIAGNOSIS — R002 Palpitations: Secondary | ICD-10-CM

## 2020-04-07 DIAGNOSIS — E538 Deficiency of other specified B group vitamins: Secondary | ICD-10-CM | POA: Diagnosis not present

## 2020-04-08 ENCOUNTER — Telehealth (HOSPITAL_COMMUNITY): Payer: Self-pay | Admitting: *Deleted

## 2020-04-08 NOTE — Telephone Encounter (Signed)
Patient given detailed instructions per Myocardial Perfusion Study Information Sheet for the test on 04/13/20 at 1:15. Patient notified to arrive 15 minutes early and that it is imperative to arrive on time for appointment to keep from having the test rescheduled.  If you need to cancel or reschedule your appointment, please call the office within 24 hours of your appointment. . Patient verbalized understanding.Jaime Nichols

## 2020-04-09 DIAGNOSIS — N200 Calculus of kidney: Secondary | ICD-10-CM | POA: Diagnosis not present

## 2020-04-09 DIAGNOSIS — Z1159 Encounter for screening for other viral diseases: Secondary | ICD-10-CM | POA: Diagnosis not present

## 2020-04-09 DIAGNOSIS — Z79899 Other long term (current) drug therapy: Secondary | ICD-10-CM | POA: Diagnosis not present

## 2020-04-13 ENCOUNTER — Other Ambulatory Visit: Payer: Self-pay

## 2020-04-13 ENCOUNTER — Ambulatory Visit (HOSPITAL_COMMUNITY): Payer: BC Managed Care – PPO | Attending: Cardiovascular Disease

## 2020-04-13 VITALS — Ht 62.0 in | Wt 273.0 lb

## 2020-04-13 DIAGNOSIS — R072 Precordial pain: Secondary | ICD-10-CM

## 2020-04-13 DIAGNOSIS — R110 Nausea: Secondary | ICD-10-CM | POA: Diagnosis not present

## 2020-04-13 MED ORDER — REGADENOSON 0.4 MG/5ML IV SOLN
0.4000 mg | Freq: Once | INTRAVENOUS | Status: AC
  Administered 2020-04-13: 0.4 mg via INTRAVENOUS

## 2020-04-13 MED ORDER — TECHNETIUM TC 99M TETROFOSMIN IV KIT
31.7000 | PACK | Freq: Once | INTRAVENOUS | Status: AC | PRN
  Administered 2020-04-13: 31.7 via INTRAVENOUS
  Filled 2020-04-13: qty 32

## 2020-04-13 MED ORDER — AMINOPHYLLINE 25 MG/ML IV SOLN
75.0000 mg | Freq: Once | INTRAVENOUS | Status: AC
  Administered 2020-04-13: 75 mg via INTRAVENOUS

## 2020-04-14 ENCOUNTER — Ambulatory Visit (HOSPITAL_COMMUNITY): Payer: BC Managed Care – PPO | Attending: Cardiology

## 2020-04-14 LAB — MYOCARDIAL PERFUSION IMAGING
LV dias vol: 79 mL (ref 46–106)
LV sys vol: 28 mL
Peak HR: 122 {beats}/min
Rest HR: 89 {beats}/min
SDS: 6
SRS: 0
SSS: 6
TID: 1.26

## 2020-04-14 MED ORDER — TECHNETIUM TC 99M TETROFOSMIN IV KIT
29.6000 | PACK | Freq: Once | INTRAVENOUS | Status: AC | PRN
  Administered 2020-04-14: 29.6 via INTRAVENOUS
  Filled 2020-04-14: qty 30

## 2020-05-01 ENCOUNTER — Other Ambulatory Visit: Payer: Self-pay | Admitting: Cardiology

## 2020-05-01 DIAGNOSIS — R002 Palpitations: Secondary | ICD-10-CM

## 2020-05-01 DIAGNOSIS — I471 Supraventricular tachycardia: Secondary | ICD-10-CM

## 2020-05-11 DIAGNOSIS — E538 Deficiency of other specified B group vitamins: Secondary | ICD-10-CM | POA: Diagnosis not present

## 2020-05-21 DIAGNOSIS — N3941 Urge incontinence: Secondary | ICD-10-CM | POA: Diagnosis not present

## 2020-05-21 DIAGNOSIS — R102 Pelvic and perineal pain: Secondary | ICD-10-CM | POA: Diagnosis not present

## 2020-05-21 DIAGNOSIS — N200 Calculus of kidney: Secondary | ICD-10-CM | POA: Diagnosis not present

## 2020-06-23 DIAGNOSIS — N200 Calculus of kidney: Secondary | ICD-10-CM | POA: Diagnosis not present

## 2020-06-23 DIAGNOSIS — E790 Hyperuricemia without signs of inflammatory arthritis and tophaceous disease: Secondary | ICD-10-CM | POA: Diagnosis not present

## 2020-06-23 DIAGNOSIS — E559 Vitamin D deficiency, unspecified: Secondary | ICD-10-CM | POA: Diagnosis not present

## 2020-06-23 DIAGNOSIS — Z79899 Other long term (current) drug therapy: Secondary | ICD-10-CM | POA: Diagnosis not present

## 2020-06-25 DIAGNOSIS — M170 Bilateral primary osteoarthritis of knee: Secondary | ICD-10-CM | POA: Diagnosis not present

## 2020-07-02 DIAGNOSIS — M170 Bilateral primary osteoarthritis of knee: Secondary | ICD-10-CM | POA: Diagnosis not present

## 2020-07-07 DIAGNOSIS — Z8639 Personal history of other endocrine, nutritional and metabolic disease: Secondary | ICD-10-CM | POA: Diagnosis not present

## 2020-07-07 DIAGNOSIS — E119 Type 2 diabetes mellitus without complications: Secondary | ICD-10-CM | POA: Diagnosis not present

## 2020-07-07 DIAGNOSIS — N209 Urinary calculus, unspecified: Secondary | ICD-10-CM | POA: Diagnosis not present

## 2020-07-09 DIAGNOSIS — M170 Bilateral primary osteoarthritis of knee: Secondary | ICD-10-CM | POA: Diagnosis not present

## 2020-07-21 DIAGNOSIS — M10072 Idiopathic gout, left ankle and foot: Secondary | ICD-10-CM | POA: Diagnosis not present

## 2020-07-31 ENCOUNTER — Other Ambulatory Visit: Payer: Self-pay | Admitting: Cardiology

## 2020-08-06 DIAGNOSIS — M19072 Primary osteoarthritis, left ankle and foot: Secondary | ICD-10-CM | POA: Diagnosis not present

## 2020-08-19 DIAGNOSIS — E119 Type 2 diabetes mellitus without complications: Secondary | ICD-10-CM | POA: Diagnosis not present

## 2020-08-19 DIAGNOSIS — H5213 Myopia, bilateral: Secondary | ICD-10-CM | POA: Diagnosis not present

## 2020-08-20 DIAGNOSIS — E119 Type 2 diabetes mellitus without complications: Secondary | ICD-10-CM | POA: Diagnosis not present

## 2020-08-20 DIAGNOSIS — M10072 Idiopathic gout, left ankle and foot: Secondary | ICD-10-CM | POA: Diagnosis not present

## 2020-08-20 DIAGNOSIS — Z8639 Personal history of other endocrine, nutritional and metabolic disease: Secondary | ICD-10-CM | POA: Diagnosis not present

## 2020-08-26 ENCOUNTER — Other Ambulatory Visit: Payer: Self-pay

## 2020-08-26 ENCOUNTER — Ambulatory Visit
Admission: RE | Admit: 2020-08-26 | Discharge: 2020-08-26 | Disposition: A | Discharge status: Home / Self Care | Payer: BC Managed Care – PPO | Source: Ambulatory Visit | Attending: Family Medicine | Admitting: Family Medicine

## 2020-08-26 DIAGNOSIS — Z1231 Encounter for screening mammogram for malignant neoplasm of breast: Secondary | ICD-10-CM | POA: Diagnosis not present

## 2020-08-31 DIAGNOSIS — E538 Deficiency of other specified B group vitamins: Secondary | ICD-10-CM | POA: Diagnosis not present

## 2020-09-21 DIAGNOSIS — L821 Other seborrheic keratosis: Secondary | ICD-10-CM | POA: Diagnosis not present

## 2020-09-21 DIAGNOSIS — L304 Erythema intertrigo: Secondary | ICD-10-CM | POA: Diagnosis not present

## 2020-09-21 DIAGNOSIS — D225 Melanocytic nevi of trunk: Secondary | ICD-10-CM | POA: Diagnosis not present

## 2020-09-21 DIAGNOSIS — L853 Xerosis cutis: Secondary | ICD-10-CM | POA: Diagnosis not present

## 2020-09-22 DIAGNOSIS — E790 Hyperuricemia without signs of inflammatory arthritis and tophaceous disease: Secondary | ICD-10-CM | POA: Diagnosis not present

## 2020-10-06 DIAGNOSIS — E538 Deficiency of other specified B group vitamins: Secondary | ICD-10-CM | POA: Diagnosis not present

## 2020-10-16 DIAGNOSIS — R197 Diarrhea, unspecified: Secondary | ICD-10-CM | POA: Diagnosis not present

## 2020-10-26 DIAGNOSIS — R197 Diarrhea, unspecified: Secondary | ICD-10-CM | POA: Diagnosis not present

## 2020-10-26 DIAGNOSIS — A09 Infectious gastroenteritis and colitis, unspecified: Secondary | ICD-10-CM | POA: Diagnosis not present

## 2020-11-17 ENCOUNTER — Telehealth: Payer: Self-pay | Admitting: Cardiology

## 2020-11-17 NOTE — Telephone Encounter (Signed)
pt has colonoscopy and biop sch, pcp would like to know if this procedure would need to be performed at the hospital or surgical center due to her heart condition. If pt cant be reached by phone voicemail is ok

## 2020-11-17 NOTE — Telephone Encounter (Signed)
Spoke with the patient and advised her to have the surgeon's office fax over a clearance request to [place] in regards to her colonoscopy.

## 2020-11-19 DIAGNOSIS — N200 Calculus of kidney: Secondary | ICD-10-CM | POA: Diagnosis not present

## 2020-11-19 DIAGNOSIS — R102 Pelvic and perineal pain: Secondary | ICD-10-CM | POA: Diagnosis not present

## 2020-11-24 ENCOUNTER — Telehealth: Payer: Self-pay | Admitting: Cardiology

## 2020-11-24 ENCOUNTER — Other Ambulatory Visit: Payer: Self-pay | Admitting: Physician Assistant

## 2020-11-24 DIAGNOSIS — E7800 Pure hypercholesterolemia, unspecified: Secondary | ICD-10-CM

## 2020-11-24 DIAGNOSIS — I10 Essential (primary) hypertension: Secondary | ICD-10-CM

## 2020-11-24 DIAGNOSIS — R1011 Right upper quadrant pain: Secondary | ICD-10-CM

## 2020-11-24 NOTE — Telephone Encounter (Signed)
Pt states that she hasn't had her cholesterol checked in a while, also says that she is currently taking Repatha and would like to know if Melissa from pharm d would like to sch an appt. Pt is also no longer able to take Crestor per PCP orders due to taking an rx for kidneys that conflicts.

## 2020-11-24 NOTE — Telephone Encounter (Signed)
Returned call to pt. She states she is now taking colchicine and was advised by her PCP to stop rosuvastatin while she is on colchicine. Reports her uric acid was 9.9 and she was initially was started on potassium citrate TID but this affected her tear production so she decreased to 2 a day then 1 a day. Then she started allopurinol 100mg  daily but had a gout flare, so PCP increased allopurinol to 200mg  daily and also added on colchicine 0.6mg  TIW, uric acid now down to 6.6. Reports having a stone grow that was made of uric acid. HCTZ was since discontinued.   Has been off rosuvastatin for about 4 months now, has continued with her Repatha injections every 2 weeks. Will check lipids next week per pt request. She is aware to resume her rosuvastatin 5mg  3-4 days per week which she tolerated well when she stops the colchicine (does not have an end date).

## 2020-12-01 DIAGNOSIS — M545 Low back pain, unspecified: Secondary | ICD-10-CM | POA: Diagnosis not present

## 2020-12-01 DIAGNOSIS — M10072 Idiopathic gout, left ankle and foot: Secondary | ICD-10-CM | POA: Diagnosis not present

## 2020-12-02 ENCOUNTER — Other Ambulatory Visit: Payer: BC Managed Care – PPO | Admitting: *Deleted

## 2020-12-02 ENCOUNTER — Other Ambulatory Visit: Payer: Self-pay

## 2020-12-02 DIAGNOSIS — E538 Deficiency of other specified B group vitamins: Secondary | ICD-10-CM | POA: Diagnosis not present

## 2020-12-02 DIAGNOSIS — E7800 Pure hypercholesterolemia, unspecified: Secondary | ICD-10-CM

## 2020-12-02 LAB — LIPID PANEL
Chol/HDL Ratio: 2.8 ratio (ref 0.0–4.4)
Cholesterol, Total: 130 mg/dL (ref 100–199)
HDL: 47 mg/dL (ref >39)
LDL Chol Calc (NIH): 49 mg/dL (ref 0–99)
Triglycerides: 212 mg/dL — ABNORMAL HIGH (ref 0–149)
VLDL Cholesterol Cal: 34 mg/dL (ref 5–40)

## 2020-12-08 DIAGNOSIS — N200 Calculus of kidney: Secondary | ICD-10-CM | POA: Diagnosis not present

## 2020-12-08 DIAGNOSIS — N3000 Acute cystitis without hematuria: Secondary | ICD-10-CM | POA: Diagnosis not present

## 2020-12-10 ENCOUNTER — Ambulatory Visit
Admission: RE | Admit: 2020-12-10 | Discharge: 2020-12-10 | Disposition: A | Discharge status: Home / Self Care | Payer: Self-pay | Source: Ambulatory Visit | Attending: Physician Assistant | Admitting: Physician Assistant

## 2020-12-10 DIAGNOSIS — K828 Other specified diseases of gallbladder: Secondary | ICD-10-CM | POA: Diagnosis not present

## 2020-12-10 DIAGNOSIS — R1011 Right upper quadrant pain: Secondary | ICD-10-CM

## 2020-12-10 DIAGNOSIS — K760 Fatty (change of) liver, not elsewhere classified: Secondary | ICD-10-CM | POA: Diagnosis not present

## 2020-12-14 DIAGNOSIS — N200 Calculus of kidney: Secondary | ICD-10-CM | POA: Diagnosis not present

## 2020-12-15 DIAGNOSIS — K819 Cholecystitis, unspecified: Secondary | ICD-10-CM | POA: Diagnosis not present

## 2020-12-31 ENCOUNTER — Ambulatory Visit: Payer: Self-pay | Admitting: Surgery

## 2020-12-31 ENCOUNTER — Telehealth: Payer: Self-pay | Admitting: *Deleted

## 2020-12-31 DIAGNOSIS — K811 Chronic cholecystitis: Secondary | ICD-10-CM | POA: Diagnosis not present

## 2020-12-31 NOTE — Telephone Encounter (Signed)
   [facility] Pre-operative Risk Assessment    Patient Name: Jaime Nichols  DOB: 04/01/1961 MRN: [id number]  HEARTCARE STAFF:  - IMPORTANT!!!!!! Under Visit Info/Reason for Call, type in Other and utilize the format Clearance MM/DD/YY or Clearance TBD. Do not use dashes or single digits. - Please review there is not already an duplicate clearance open for this procedure. - If request is for dental extraction, please clarify the # of teeth to be extracted. - If the patient is currently at the dentist's office, call Pre-Op Callback Staff (MA/nurse) to input urgent request.  - If the patient is not currently in the dentist office, please route to the Pre-Op pool.  Request for surgical clearance:  What type of surgery is being performed? LAPAROSCOPIC OR ROBOTIC CHOLECYSTECTOMY   When is this surgery scheduled? URGENT  What type of clearance is required (medical clearance vs. Pharmacy clearance to hold med vs. Both)? MEDICAL  Are there any medications that need to be held prior to surgery and how long?  ASA   Practice name and name of physician performing surgery? [facility]; DR. Jens Som  What is the office phone number? [phone]   7.   What is the office fax number? [phone] ATTN: Flint Melter, CMA  8.   Anesthesia type (None, local, MAC, general) ? GENERAL   Julaine Hua 12/31/2020, 1:04 PM  _________________________________________________________________   (provider comments below)

## 2020-12-31 NOTE — H&P (View-Only) (Signed)
[] Show old notes  [x] Send to referringCommunications      Create Note1 CCSFOLLOWUP    Notes with a note time of more than 336 hours ago are hidden.  My Note 10:35 AM     Edit         Expand AllCollapse All        REFERRING PHYSICIAN:  Salley Slaughter   PROVIDER:  Bobbe Medico, MD   MRN: [id number] DOB: [birth year] DATE OF ENCOUNTER: 12/31/2020   Subjective    Chief Complaint: Cholelithiasis       History of Present Illness: Jaime Nichols is a 60 y.o. female who is seen today as an office consultation at the request of Dr. Cleophus Molt for evaluation of Cholelithiasis .   This is a very pleasant 60 year old woman with multiple medical problems who is referred for evaluation of possible chronic cholecystitis versus biliary colic.  She has had several years of intermittent abdominal discomfort, which she has noted in the last few months to be most focal in the right upper quadrant radiating to the back and spine.  She does endorse a history of kidney stones and it is difficult to parse out whether the symptoms are coming from that or from her gallbladder.  She does not have postprandial exacerbation of the pain or nausea per se.  She does have an aversion to greasy foods for the last 5 or 6 years.  She has noted for the last few months that when she eats anything fibrous she has diarrhea within an hour and it seems to her that the food she has eaten has gone through undigested.  She also notes yellow-colored diarrhea.  She does have a colonoscopy scheduled for next month.   He had a gallbladder ultrasound that showed gallbladder wall thickening, sludge, and pericholecystic fluid, hepatic steatosis.  She reports that she had LFTs that showed elevated AST and ALT but does not recall any other abnormalities.  I do not have those labs to review.   Previous abdominal surgeries have been laparoscopic hysterectomy.     Review of Systems: A complete  review of systems was obtained from the patient.  I have reviewed this information and discussed as appropriate with the patient.  See HPI as well for other ROS.     Medical History: Past Medical History      Past Medical History:  Diagnosis Date   Arrhythmia     Arthritis     CHF (congestive heart failure) (CMS-HCC)     Diabetes mellitus without complication (CMS-HCC)     Hyperlipidemia     Hypertension     Sleep apnea             Patient Active Problem List  Diagnosis   Diabetes type 2, controlled (CMS-HCC)   Dyspnea   HTN (hypertension)   Hyperlipidemia   OSA on CPAP      Past Surgical History       Past Surgical History:  Procedure Laterality Date   HYSTERECTOMY       LITHOTRIPSY            Allergies       Allergies  Allergen Reactions   Erythromycin Diarrhea and Nausea And Vomiting   Latex Other (See Comments) and Rash      REACTION: "CONTACT DERMATITIS" REACTION: "CONTACT DERMATITIS"     Nickel Other (See Comments) and Unknown      Poor healing  Poor healing   Other reaction(s): Other Poor healing     Statins-Hmg-Coa Reductase Inhibitors Other (See Comments) and Unknown              Current Outpatient Medications on File Prior to Visit  Medication Sig Dispense Refill   evolocumab (REPATHA SURECLICK) 793 mg/mL PnIj Repatha SureClick 903 mg/mL subcutaneous pen injector       lisinopriL (ZESTRIL) 10 MG tablet lisinopril 10 mg tablet  TAKE 1 TABLET BY MOUTH EVERY DAY       metFORMIN (GLUCOPHAGE-XR) 500 MG XR tablet Take 1,000 mg by mouth 2 (two) times daily       metoprolol succinate (TOPROL-XL) 25 MG XL tablet metoprolol succinate ER 25 mg tablet,extended release 24 hr  TAKE 1 TABLET BY MOUTH EVERY DAY       allopurinoL (ZYLOPRIM) 100 MG tablet allopurinol 100 mg tablet  TAKE 1 TABLET BY MOUTH EVERY DAY       cholecalciferol, vitamin D3, 10 mcg (400 unit) Cap Take by mouth       colchicine (COLCRYS) 0.6 mg tablet colchicine 0.6 mg tablet  TAKE 1  TABLET BY MOUTH EVERY DAY       diltiazem (CARDIZEM) 30 MG tablet diltiazem 30 mg tablet  Take 1 tablet 3 times a day by oral route.       dulaglutide (TRULICITY) 0.09 QZ/3.0 mL pen injector Trulicity 0.76 AU/6.3 mL subcutaneous pen injector  INJECT 1 PEN UNDER THE SKIN WEEKLY       glimepiride (AMARYL) 4 MG tablet glimepiride 4 mg tablet  1/2 TABLET WITH FOOD ONCE A DAY EACH MORNING AS NEEDED       potassium citrate (UROCIT-K) 10 mEq ER tablet potassium citrate ER 10 mEq (1,080 mg) tablet,extended release  TAKE ONE TABLET (10 MEQ DOSE) BY MOUTH 3 (THREE) TIMES DAILY WITH MEALS.        No current facility-administered medications on file prior to visit.      Family History       Family History  Problem Relation Age of Onset   High blood pressure (Hypertension) Mother     Hyperlipidemia (Elevated cholesterol) Mother     Myocardial Infarction (Heart attack) Father     Obesity Sister     Hyperlipidemia (Elevated cholesterol) Brother          Social History       Tobacco Use  Smoking Status Never Smoker  Smokeless Tobacco Never Used      Social History  Social History         Socioeconomic History   Marital status: Unknown  Tobacco Use   Smoking status: Never Smoker   Smokeless tobacco: Never Used  Scientific laboratory technician Use: Never used  Substance and Sexual Activity   Alcohol use: Yes      Comment: monthly, infrequently   Drug use: Never        Objective:         Vitals:    12/31/20 1039  BP: 128/74  Pulse: (!) 116  Temp: 36.5 C (97.7 F)  SpO2: 96%  Weight: (!) 122 kg (269 lb)  Height: 160 cm (5\' 3" )  PainSc: 0-No pain    Body mass index is 47.65 kg/m.   She is alert, well-appearing Unlabored respirations Abdomen is obese, soft, and minimally tender in the right subcostal region.   Assessment and Plan:  Diagnoses and all orders for this visit:   Chronic cholecystitis   Her symptoms are  somewhat atypical but she does have right upper quadrant  pain and an abnormal gallbladder ultrasound.  We discussed that this may be secondary to her heart disease, but given that she has some digestive issues and pain in the general area I think it is reasonable to proceed with laparoscopic or robotic cholecystectomy.  We discussed the relevant anatomy, using a diagram to demonstrate.  Discussed the surgical technique, and risks of surgery including bleeding, pain, scarring, intraabdominal injury specifically to the common bile duct and sequelae, bile leak, conversion to open surgery, blood clot, pneumonia, heart attack, stroke, failure to resolve symptoms, etc. discussed that her risk of complications is higher due to her medical history and habitus.  Will request cardiac clearance preoperatively.  Questions welcomed and answered.  After discussion and consideration, she does wish to proceed with surgery.     Peightyn Roberson AMANDA Rin Gorton, MD    I spent a total of 50 minutes in both face-to-face and non-face-to-face activities for this visit on the date of this encounter.

## 2020-12-31 NOTE — H&P (Signed)
[] Show old notes  [x] Send to referringCommunications      Create Note1 CCSFOLLOWUP    Notes with a note time of more than 336 hours ago are hidden.  My Note 10:35 AM     Edit         Expand AllCollapse All        REFERRING PHYSICIAN:  Salley Slaughter   PROVIDER:  Bobbe Medico, MD   MRN: [id number] DOB: 25-Apr-1961 DATE OF ENCOUNTER: 12/31/2020   Subjective    Chief Complaint: Cholelithiasis       History of Present Illness: Jaime Nichols is a 60 y.o. female who is seen today as an office consultation at the request of Dr. Cleophus Molt for evaluation of Cholelithiasis .   This is a very pleasant 60 year old woman with multiple medical problems who is referred for evaluation of possible chronic cholecystitis versus biliary colic.  She has had several years of intermittent abdominal discomfort, which she has noted in the last few months to be most focal in the right upper quadrant radiating to the back and spine.  She does endorse a history of kidney stones and it is difficult to parse out whether the symptoms are coming from that or from her gallbladder.  She does not have postprandial exacerbation of the pain or nausea per se.  She does have an aversion to greasy foods for the last 5 or 6 years.  She has noted for the last few months that when she eats anything fibrous she has diarrhea within an hour and it seems to her that the food she has eaten has gone through undigested.  She also notes yellow-colored diarrhea.  She does have a colonoscopy scheduled for next month.   He had a gallbladder ultrasound that showed gallbladder wall thickening, sludge, and pericholecystic fluid, hepatic steatosis.  She reports that she had LFTs that showed elevated AST and ALT but does not recall any other abnormalities.  I do not have those labs to review.   Previous abdominal surgeries have been laparoscopic hysterectomy.     Review of Systems: A complete  review of systems was obtained from the patient.  I have reviewed this information and discussed as appropriate with the patient.  See HPI as well for other ROS.     Medical History: Past Medical History      Past Medical History:  Diagnosis Date   Arrhythmia     Arthritis     CHF (congestive heart failure) (CMS-HCC)     Diabetes mellitus without complication (CMS-HCC)     Hyperlipidemia     Hypertension     Sleep apnea             Patient Active Problem List  Diagnosis   Diabetes type 2, controlled (CMS-HCC)   Dyspnea   HTN (hypertension)   Hyperlipidemia   OSA on CPAP      Past Surgical History       Past Surgical History:  Procedure Laterality Date   HYSTERECTOMY       LITHOTRIPSY            Allergies       Allergies  Allergen Reactions   Erythromycin Diarrhea and Nausea And Vomiting   Latex Other (See Comments) and Rash      REACTION: "CONTACT DERMATITIS" REACTION: "CONTACT DERMATITIS"     Nickel Other (See Comments) and Unknown      Poor healing  Poor healing   Other reaction(s): Other Poor healing     Statins-Hmg-Coa Reductase Inhibitors Other (See Comments) and Unknown              Current Outpatient Medications on File Prior to Visit  Medication Sig Dispense Refill   evolocumab (REPATHA SURECLICK) 660 mg/mL PnIj Repatha SureClick 630 mg/mL subcutaneous pen injector       lisinopriL (ZESTRIL) 10 MG tablet lisinopril 10 mg tablet  TAKE 1 TABLET BY MOUTH EVERY DAY       metFORMIN (GLUCOPHAGE-XR) 500 MG XR tablet Take 1,000 mg by mouth 2 (two) times daily       metoprolol succinate (TOPROL-XL) 25 MG XL tablet metoprolol succinate ER 25 mg tablet,extended release 24 hr  TAKE 1 TABLET BY MOUTH EVERY DAY       allopurinoL (ZYLOPRIM) 100 MG tablet allopurinol 100 mg tablet  TAKE 1 TABLET BY MOUTH EVERY DAY       cholecalciferol, vitamin D3, 10 mcg (400 unit) Cap Take by mouth       colchicine (COLCRYS) 0.6 mg tablet colchicine 0.6 mg tablet  TAKE 1  TABLET BY MOUTH EVERY DAY       diltiazem (CARDIZEM) 30 MG tablet diltiazem 30 mg tablet  Take 1 tablet 3 times a day by oral route.       dulaglutide (TRULICITY) 1.60 FU/9.3 mL pen injector Trulicity 2.35 TD/3.2 mL subcutaneous pen injector  INJECT 1 PEN UNDER THE SKIN WEEKLY       glimepiride (AMARYL) 4 MG tablet glimepiride 4 mg tablet  1/2 TABLET WITH FOOD ONCE A DAY EACH MORNING AS NEEDED       potassium citrate (UROCIT-K) 10 mEq ER tablet potassium citrate ER 10 mEq (1,080 mg) tablet,extended release  TAKE ONE TABLET (10 MEQ DOSE) BY MOUTH 3 (THREE) TIMES DAILY WITH MEALS.        No current facility-administered medications on file prior to visit.      Family History       Family History  Problem Relation Age of Onset   High blood pressure (Hypertension) Mother     Hyperlipidemia (Elevated cholesterol) Mother     Myocardial Infarction (Heart attack) Father     Obesity Sister     Hyperlipidemia (Elevated cholesterol) Brother          Social History       Tobacco Use  Smoking Status Never Smoker  Smokeless Tobacco Never Used      Social History  Social History         Socioeconomic History   Marital status: Unknown  Tobacco Use   Smoking status: Never Smoker   Smokeless tobacco: Never Used  Scientific laboratory technician Use: Never used  Substance and Sexual Activity   Alcohol use: Yes      Comment: monthly, infrequently   Drug use: Never        Objective:         Vitals:    12/31/20 1039  BP: 128/74  Pulse: (!) 116  Temp: 36.5 C (97.7 F)  SpO2: 96%  Weight: (!) 122 kg (269 lb)  Height: 160 cm (5\' 3" )  PainSc: 0-No pain    Body mass index is 47.65 kg/m.   She is alert, well-appearing Unlabored respirations Abdomen is obese, soft, and minimally tender in the right subcostal region.   Assessment and Plan:  Diagnoses and all orders for this visit:   Chronic cholecystitis   Her symptoms are  somewhat atypical but she does have right upper quadrant  pain and an abnormal gallbladder ultrasound.  We discussed that this may be secondary to her heart disease, but given that she has some digestive issues and pain in the general area I think it is reasonable to proceed with laparoscopic or robotic cholecystectomy.  We discussed the relevant anatomy, using a diagram to demonstrate.  Discussed the surgical technique, and risks of surgery including bleeding, pain, scarring, intraabdominal injury specifically to the common bile duct and sequelae, bile leak, conversion to open surgery, blood clot, pneumonia, heart attack, stroke, failure to resolve symptoms, etc. discussed that her risk of complications is higher due to her medical history and habitus.  Will request cardiac clearance preoperatively.  Questions welcomed and answered.  After discussion and consideration, she does wish to proceed with surgery.     Sonya Gunnoe AMANDA Erinn Mendosa, MD    I spent a total of 50 minutes in both face-to-face and non-face-to-face activities for this visit on the date of this encounter.

## 2020-12-31 NOTE — Telephone Encounter (Signed)
   Name: Jaime Nichols  DOB: 04-Mar-1961  MRN: [id number]   Primary Cardiologist: Fransico Him, MD  Chart reviewed as part of pre-operative protocol coverage. Patient was contacted 12/31/2020 in reference to pre-operative risk assessment for pending surgery as outlined below.  Jaime Nichols was last seen on 03/23/2020 by Dr. Radford Pax. She had a NST which was non-ischemic following this visit and   Since that day, Jaime Nichols has done fine from a cardiac standpoint. She has chronic DOE which is unchanged in recent months. She can complete 4 METs without anginal complaints..  Therefore, based on ACC/AHA guidelines, the patient would be at acceptable risk for the planned procedure without further cardiovascular testing.   The patient was advised that if she develops new symptoms prior to surgery to contact our office to arrange for a follow-up visit, and she verbalized understanding.  Patient can hold aspirin 7 days prior to her upcoming procedure if needed with plans to restart when cleared to do so by her surgeon.   I will route this recommendation to the requesting party via Epic fax function and remove from pre-op pool. Please call with questions.  Abigail Butts, PA-C 12/31/2020, 3:20 PM

## 2021-01-04 NOTE — Patient Instructions (Signed)
DUE TO COVID-19 ONLY ONE VISITOR IS ALLOWED TO COME WITH YOU AND STAY IN THE WAITING ROOM ONLY DURING PRE OP AND PROCEDURE DAY OF SURGERY. THE 1 VISITOR  MAY VISIT WITH YOU AFTER SURGERY IN YOUR PRIVATE ROOM DURING VISITING HOURS ONLY!                Jaime Nichols     Your procedure is scheduled on: 01/11/21   Report to [facility] Main  Entrance   Report to admitting at   11:15AM     Call this number if you have problems the morning of surgery [phone]    Remember: Do not eat food or drink liquids :After Midnight.   BRUSH YOUR TEETH MORNING OF SURGERY AND RINSE YOUR MOUTH OUT, NO CHEWING GUM CANDY OR MINTS.     Take these medicines the morning of surgery with A SIP OF WATER: Metoprolol, Diltiazem,Allopurinol, Use your inhaler and bring it with you.         How to Manage Your Diabetes Before and After Surgery  Why is it important to control my blood sugar before and after surgery? Improving blood sugar levels before and after surgery helps healing and can limit problems. A way of improving blood sugar control is eating a healthy diet by:  Eating less sugar and carbohydrates  Increasing activity/exercise  Talking with your doctor about reaching your blood sugar goals High blood sugars (greater than 180 mg/dL) can raise your risk of infections and slow your recovery, so you will need to focus on controlling your diabetes during the weeks before surgery. Make sure that the doctor who takes care of your diabetes knows about your planned surgery including the date and location.  How do I manage my blood sugar before surgery? Check your blood sugar at least 4 times a day, starting 2 days before surgery, to make sure that the level is not too high or low. Check your blood sugar the morning of your surgery when you wake up and every 2 hours until you get to the Short Stay unit. If your blood sugar is less than 70 mg/dL, you will need to treat for low blood sugar: Do  not take insulin. Treat a low blood sugar (less than 70 mg/dL) with  cup of clear juice (cranberry or apple), 4 glucose tablets, OR glucose gel. Recheck blood sugar in 15 minutes after treatment (to make sure it is greater than 70 mg/dL). If your blood sugar is not greater than 70 mg/dL on recheck, call [phone] for further instructions. Report your blood sugar to the short stay nurse when you get to Short Stay.  If you are admitted to the hospital after surgery: Your blood sugar will be checked by the staff and you will probably be given insulin after surgery (instead of oral diabetes medicines) to make sure you have good blood sugar levels. The goal for blood sugar control after surgery is 80-180 mg/dL.   WHAT DO I DO ABOUT MY DIABETES MEDICATION?  Do not take oral diabetes medicines (pills) the morning of surgery. . The day of surgery, do not take other diabetes injectables, including Byetta (exenatide), Bydureon (exenatide ER), Victoza (liraglutide), or Trulicity (dulaglutide).                              You may not have any metal on your body including hair pins and  piercings  Do not wear jewelry, make-up, lotions, powders or perfumes, deodorant             Do not wear nail polish on your fingernails.  Do not shave  48 hours prior to surgery.                 Do not bring valuables to the hospital. [place].  Contacts, dentures or bridgework may not be worn into surgery.       Patients discharged the day of surgery will not be allowed to drive home.   IF YOU ARE HAVING SURGERY AND GOING HOME THE SAME DAY, YOU MUST HAVE AN ADULT TO DRIVE YOU HOME AND BE WITH YOU FOR 24 HOURS.   YOU MAY GO HOME BY TAXI OR UBER OR ORTHERWISE, BUT AN ADULT MUST ACCOMPANY YOU HOME AND STAY WITH YOU FOR 24 HOURS.  Name and phone number of your driver:  Special Instructions: N/A              Please read over the following fact  sheets you were given: _____________________________________________________________________             [facility] - Preparing for Surgery Before surgery, you can play an important role.  Because skin is not sterile, your skin needs to be as free of germs as possible.  You can reduce the number of germs on your skin by washing with CHG (chlorahexidine gluconate) soap before surgery.  CHG is an antiseptic cleaner which kills germs and bonds with the skin to continue killing germs even after washing. Please DO NOT use if you have an allergy to CHG or antibacterial soaps.  If your skin becomes reddened/irritated stop using the CHG and inform your nurse when you arrive at Short Stay. Do not shave (including legs and underarms) for at least 48 hours prior to the first CHG shower.    Please follow these instructions carefully:  1.  Shower with CHG Soap the night before surgery and the  morning of Surgery.  2.  If you choose to wash your hair, wash your hair first as usual with your  normal  shampoo.  3.  After you shampoo, rinse your hair and body thoroughly to remove the  shampoo.                                        4.  Use CHG as you would any other liquid soap.  You can apply chg directly  to the skin and wash                       Gently with a scrungie or clean washcloth.  5.  Apply the CHG Soap to your body ONLY FROM THE NECK DOWN.   Do not use on face/ open                           Wound or open sores. Avoid contact with eyes, ears mouth and genitals (private parts).                       Wash face,  Genitals (private parts) with your normal soap.  6.  Wash thoroughly, paying special attention to the area where your surgery  will be performed.  7.  Thoroughly rinse your body with warm water from the neck down.  8.  DO NOT shower/wash with your normal soap after using and rinsing off  the CHG Soap.             9.  Pat yourself dry with a clean towel.            10.  Wear clean  pajamas.            11.  Place clean sheets on your bed the night of your first shower and do not  sleep with pets. Day of Surgery : Do not apply any lotions/deodorants the morning of surgery.  Please wear clean clothes to the hospital/surgery center.  FAILURE TO FOLLOW THESE INSTRUCTIONS MAY RESULT IN THE CANCELLATION OF YOUR SURGERY PATIENT SIGNATURE_________________________________  NURSE SIGNATURE__________________________________  ________________________________________________________________________

## 2021-01-05 ENCOUNTER — Encounter (HOSPITAL_COMMUNITY)
Admission: RE | Admit: 2021-01-05 | Discharge: 2021-01-05 | Disposition: A | Discharge status: Home / Self Care | Payer: BC Managed Care – PPO | Source: Ambulatory Visit | Attending: Surgery | Admitting: Surgery

## 2021-01-05 ENCOUNTER — Encounter (HOSPITAL_COMMUNITY): Payer: Self-pay

## 2021-01-05 ENCOUNTER — Other Ambulatory Visit: Payer: Self-pay

## 2021-01-05 DIAGNOSIS — Z01812 Encounter for preprocedural laboratory examination: Secondary | ICD-10-CM | POA: Insufficient documentation

## 2021-01-05 HISTORY — DX: Dyspnea, unspecified: R06.00

## 2021-01-05 HISTORY — DX: Cardiac arrhythmia, unspecified: I49.9

## 2021-01-05 LAB — CBC
HCT: 41.7 % (ref 36.0–46.0)
Hemoglobin: 13.8 g/dL (ref 12.0–15.0)
MCH: 29.8 pg (ref 26.0–34.0)
MCHC: 33.1 g/dL (ref 30.0–36.0)
MCV: 90.1 fL (ref 80.0–100.0)
Platelets: 301 10*3/uL (ref 150–400)
RBC: 4.63 MIL/uL (ref 3.87–5.11)
RDW: 13.1 % (ref 11.5–15.5)
WBC: 7.7 10*3/uL (ref 4.0–10.5)
nRBC: 0 % (ref 0.0–0.2)

## 2021-01-05 LAB — BASIC METABOLIC PANEL
Anion gap: 10 (ref 5–15)
BUN: 12 mg/dL (ref 8–23)
CO2: 29 mmol/L (ref 22–32)
Calcium: 9.4 mg/dL (ref 8.9–10.3)
Chloride: 101 mmol/L (ref 98–111)
Creatinine, Ser: 0.67 mg/dL (ref 0.44–1.00)
GFR, Estimated: >60 mL/min (ref >60)
Glucose, Bld: 134 mg/dL — ABNORMAL HIGH (ref 70–99)
Potassium: 4.1 mmol/L (ref 3.5–5.1)
Sodium: 140 mmol/L (ref 135–145)

## 2021-01-05 LAB — HEMOGLOBIN A1C
Hgb A1c MFr Bld: 7.7 % — ABNORMAL HIGH (ref 4.8–5.6)
Mean Plasma Glucose: 174.29 mg/dL

## 2021-01-05 LAB — GLUCOSE, CAPILLARY: Glucose-Capillary: 148 mg/dL — ABNORMAL HIGH (ref 70–99)

## 2021-01-05 NOTE — Progress Notes (Signed)
COVID Vaccine Completed:Yes Date COVID Vaccine completed:10/01/19 COVID vaccine manufacturer: Moderna      PCP - Dr. Wendi Snipes [place] 09/22/20 [facility] Cardiologist - Dr. Ashok Norris LOV 03/20/20 clearance in Epic Pulmonologist- Dr. Kizzie Ide  Chest x-ray - 03/16/20-epic EKG - 03/20/20-epic Stress Test - 04/14/20-epic ECHO - 06/29/17-epic Cardiac Cath - no Pacemaker/ICD device last checked:NA  Sleep Study - yes CPAP - yes  Fasting Blood Sugar - 120-145 Checks Blood Sugar _QD____ times a day  Blood Thinner Instructions:no Aspirin Instructions: Last Dose:  Anesthesia review: Yes  Patient denies shortness of breath, fever, cough and chest pain at PAT appointment Pt has a BMI of 47.3. She has decreased lung function and get SOB when laying flat. She uses a C-Pap and has needed it in the PACU while waking up.  Patient verbalized understanding of instructions that were given to them at the PAT appointment. Patient was also instructed that they will need to review over the PAT instructions again at home before surgery. Yes Pt has an umbilical hernia that she has had for years.

## 2021-01-06 NOTE — Progress Notes (Signed)
Anesthesia Chart Review   Case: [id number] Date/Time: 01/11/21 1300   Procedure: XI ROBOTIC ASSISTED LAPAROSCOPIC CHOLECYSTECTOMY   Anesthesia type: General   Pre-op diagnosis: BILIARY COLIC   Location: [facility] ROOM 02 / [facility]   Surgeons: Clovis Riley, MD       DISCUSSION:60 y.o. never smoker with h/o HTN, DM II, CHF, biliary colic scheduled for above procedure 01/11/2021 with Dr. Romana Juniper.   Per cardiology preoperative evaluation 12/31/2020, " Chart reviewed as part of pre-operative protocol coverage. Patient was contacted 12/31/2020 in reference to pre-operative risk assessment for pending surgery as outlined below.  [place] Rommel Marley was last seen on 03/23/2020 by Dr. Radford Pax. She had a NST which was non-ischemic following this visit and   Since that day, Rashada Klontz Goldwasser has done fine from a cardiac standpoint. She has chronic DOE which is unchanged in recent months. She can complete 4 METs without anginal complaints..   Therefore, based on ACC/AHA guidelines, the patient would be at acceptable risk for the planned procedure without further cardiovascular testing."  Anticipate pt can proceed with planned procedure barring acute status change.   VS: BP (!) 161/77   Pulse 94   Temp 36.8 C (Oral)   Resp (!) 97   Ht 5\' 3"  (1.6 m)   Wt 121.1 kg   SpO2 97%   BMI 47.30 kg/m   PROVIDERS: Cari Caraway, MD is PCP   Fransico Him, MD is Cardiologist  LABS: Labs reviewed: Acceptable for surgery. (all labs ordered are listed, but only abnormal results are displayed)  Labs Reviewed  BASIC METABOLIC PANEL - Abnormal; Notable for the following components:      Result Value   Glucose, Bld 134 (*)    All other components within normal limits  HEMOGLOBIN A1C - Abnormal; Notable for the following components:   Hgb A1c MFr Bld 7.7 (*)    All other components within normal limits  GLUCOSE, CAPILLARY - Abnormal; Notable for the following components:   Glucose-Capillary 148 (*)     All other components within normal limits  CBC     IMAGES:   EKG: 03/20/2020 Rate 92 bpm  NSR LAD Anteroseptal infarct, age undetermined   CV: Echo 06/29/2017 Study Conclusions   - Left ventricle: The cavity size was normal. There was mild    concentric hypertrophy. Systolic function was normal. The    estimated ejection fraction was in the range of 60% to 65%. Wall    motion was normal; there were no regional wall motion    abnormalities. Features are consistent with a pseudonormal left    ventricular filling pattern, with concomitant abnormal relaxation    and increased filling pressure (grade 2 diastolic dysfunction).    Doppler parameters are consistent with high ventricular filling    pressure.  - Left atrium: The atrium was mildly dilated.  - Tricuspid valve: There was trivial regurgitation.  - Pulmonary arteries: PA peak pressure: 32 mm Hg (S).   Stress Test 04/14/2020 There was no ST segment deviation noted during stress. The left ventricular ejection fraction is normal (55-65%). Nuclear stress EF: 64%. The study is normal. This is a low risk study.  Past Medical History:  Diagnosis Date   Aortic atherosclerosis (HCC)    Chronic diastolic CHF (congestive heart failure) ([place]) 2012   Coronary artery calcification seen on CAT scan    very high calcium score at 875   Diabetes mellitus type 2 in obese [facility])    Dyspnea  with exertion   Dysrhythmia    PVCs, PAC   Fibroid    History of kidney stones 09/2019   HTN (hypertension)    Hyperlipidemia    Morbid obesity (HCC)    OSA on CPAP    PAT (paroxysmal atrial tachycardia) (HCC)    Premature atrial contractions    PVC's (premature ventricular contractions)     Past Surgical History:  Procedure Laterality Date   ABDOMINAL HYSTERECTOMY  06/20/2008   BSO   Benign uterine polyps  01/18/2009   DILATION AND CURETTAGE OF UTERUS  2010   HYSTEROSCOPY  2010   URETEROSCOPY WITH HOLMIUM LASER LITHOTRIPSY  Left 2021    MEDICATIONS:  allopurinol (ZYLOPRIM) 100 MG tablet   aspirin EC 81 MG tablet   cholecalciferol (VITAMIN D3) 25 MCG (1000 UNIT) tablet   colchicine 0.6 MG tablet   diltiazem (CARDIZEM CD) 120 MG 24 hr capsule   Dulaglutide 1.5 MG/0.5ML SOPN   estradiol (ESTRACE) 0.1 MG/GM vaginal cream   glimepiride (AMARYL) 4 MG tablet   levalbuterol (XOPENEX HFA) 45 MCG/ACT inhaler   lisinopril (ZESTRIL) 10 MG tablet   metFORMIN (GLUCOPHAGE-XR) 500 MG 24 hr tablet   metoprolol succinate (TOPROL-XL) 25 MG 24 hr tablet   NON FORMULARY   potassium citrate (UROCIT-K) 10 MEQ (1080 MG) SR tablet   REPATHA SURECLICK 421 MG/ML SOAJ   rosuvastatin (CRESTOR) 5 MG tablet   No current facility-administered medications for this encounter.     Konrad Felix, PA-C [facility] [phone]

## 2021-01-06 NOTE — Anesthesia Preprocedure Evaluation (Addendum)
Anesthesia Evaluation  Patient identified by MRN, date of birth, ID band Patient awake    Reviewed: Allergy & Precautions, NPO status , Patient's Chart, lab work & pertinent test results  Airway Mallampati: II  TM Distance: >3 FB Neck ROM: Full    Dental  (+) Dental Advisory Given   Pulmonary sleep apnea ,    breath sounds clear to auscultation       Cardiovascular hypertension, Pt. on medications and Pt. on home beta blockers + CAD  + dysrhythmias  Rhythm:Regular Rate:Normal     Neuro/Psych negative neurological ROS     GI/Hepatic negative GI ROS, Neg liver ROS,   Endo/Other  diabetes, Type 2, Oral Hypoglycemic Agents  Renal/GU negative Renal ROS     Musculoskeletal   Abdominal   Peds  Hematology negative hematology ROS (+)   Anesthesia Other Findings   Reproductive/Obstetrics                            Anesthesia Physical Anesthesia Plan  ASA: 3  Anesthesia Plan: General   Post-op Pain Management:    Induction: Intravenous  PONV Risk Score and Plan: 3 and Dexamethasone, Ondansetron, Midazolam and Treatment may vary due to age or medical condition  Airway Management Planned: Oral ETT  Additional Equipment:   Intra-op Plan:   Post-operative Plan: Extubation in OR  Informed Consent: I have reviewed the patients History and Physical, chart, labs and discussed the procedure including the risks, benefits and alternatives for the proposed anesthesia with the patient or authorized representative who has indicated his/her understanding and acceptance.     Dental advisory given  Plan Discussed with: CRNA  Anesthesia Plan Comments:        Anesthesia Quick Evaluation

## 2021-01-10 MED ORDER — BUPIVACAINE LIPOSOME 1.3 % IJ SUSP
20.0000 mL | Freq: Once | INTRAMUSCULAR | Status: DC
  Filled 2021-01-10: qty 20

## 2021-01-11 ENCOUNTER — Encounter (HOSPITAL_COMMUNITY): Payer: Self-pay | Admitting: Surgery

## 2021-01-11 ENCOUNTER — Ambulatory Visit (HOSPITAL_COMMUNITY): Payer: BC Managed Care – PPO | Admitting: Registered Nurse

## 2021-01-11 ENCOUNTER — Encounter (HOSPITAL_COMMUNITY)
Admission: RE | Disposition: A | Discharge status: Home / Self Care | Payer: Self-pay | Source: Home / Self Care | Attending: Surgery

## 2021-01-11 ENCOUNTER — Ambulatory Visit (HOSPITAL_COMMUNITY): Payer: BC Managed Care – PPO | Admitting: Physician Assistant

## 2021-01-11 ENCOUNTER — Ambulatory Visit (HOSPITAL_COMMUNITY)
Admission: RE | Admit: 2021-01-11 | Discharge: 2021-01-11 | Disposition: A | Discharge status: Home / Self Care | Payer: BC Managed Care – PPO | Attending: Surgery | Admitting: Surgery

## 2021-01-11 DIAGNOSIS — Z888 Allergy status to other drugs, medicaments and biological substances status: Secondary | ICD-10-CM | POA: Diagnosis not present

## 2021-01-11 DIAGNOSIS — Z881 Allergy status to other antibiotic agents status: Secondary | ICD-10-CM | POA: Diagnosis not present

## 2021-01-11 DIAGNOSIS — Z7984 Long term (current) use of oral hypoglycemic drugs: Secondary | ICD-10-CM | POA: Diagnosis not present

## 2021-01-11 DIAGNOSIS — Z87442 Personal history of urinary calculi: Secondary | ICD-10-CM | POA: Insufficient documentation

## 2021-01-11 DIAGNOSIS — K8044 Calculus of bile duct with chronic cholecystitis without obstruction: Secondary | ICD-10-CM | POA: Diagnosis not present

## 2021-01-11 DIAGNOSIS — Z79899 Other long term (current) drug therapy: Secondary | ICD-10-CM | POA: Insufficient documentation

## 2021-01-11 DIAGNOSIS — K8066 Calculus of gallbladder and bile duct with acute and chronic cholecystitis without obstruction: Secondary | ICD-10-CM | POA: Diagnosis not present

## 2021-01-11 DIAGNOSIS — K811 Chronic cholecystitis: Secondary | ICD-10-CM | POA: Diagnosis not present

## 2021-01-11 DIAGNOSIS — Z91040 Latex allergy status: Secondary | ICD-10-CM | POA: Insufficient documentation

## 2021-01-11 DIAGNOSIS — K760 Fatty (change of) liver, not elsewhere classified: Secondary | ICD-10-CM | POA: Diagnosis not present

## 2021-01-11 DIAGNOSIS — K8050 Calculus of bile duct without cholangitis or cholecystitis without obstruction: Secondary | ICD-10-CM | POA: Diagnosis not present

## 2021-01-11 DIAGNOSIS — G4733 Obstructive sleep apnea (adult) (pediatric): Secondary | ICD-10-CM | POA: Diagnosis not present

## 2021-01-11 DIAGNOSIS — Z9989 Dependence on other enabling machines and devices: Secondary | ICD-10-CM | POA: Diagnosis not present

## 2021-01-11 LAB — GLUCOSE, CAPILLARY
Glucose-Capillary: 112 mg/dL — ABNORMAL HIGH (ref 70–99)
Glucose-Capillary: 171 mg/dL — ABNORMAL HIGH (ref 70–99)

## 2021-01-11 SURGERY — CHOLECYSTECTOMY, ROBOT-ASSISTED, LAPAROSCOPIC
Anesthesia: General | Site: Abdomen

## 2021-01-11 MED ORDER — FENTANYL CITRATE (PF) 100 MCG/2ML IJ SOLN
INTRAMUSCULAR | Status: AC
  Filled 2021-01-11: qty 2

## 2021-01-11 MED ORDER — ROCURONIUM BROMIDE 10 MG/ML (PF) SYRINGE
PREFILLED_SYRINGE | INTRAVENOUS | Status: DC | PRN
  Administered 2021-01-11: 50 mg via INTRAVENOUS

## 2021-01-11 MED ORDER — ACETAMINOPHEN 500 MG PO TABS
1000.0000 mg | ORAL_TABLET | ORAL | Status: AC
  Administered 2021-01-11: 1000 mg via ORAL
  Filled 2021-01-11: qty 2

## 2021-01-11 MED ORDER — ORAL CARE MOUTH RINSE: 15.0000 mL | Freq: Once | OROMUCOSAL | Status: AC

## 2021-01-11 MED ORDER — CHLORHEXIDINE GLUCONATE 0.12 % MT SOLN
15.0000 mL | Freq: Once | OROMUCOSAL | Status: AC
  Administered 2021-01-11: 15 mL via OROMUCOSAL

## 2021-01-11 MED ORDER — FENTANYL CITRATE (PF) 250 MCG/5ML IJ SOLN
INTRAMUSCULAR | Status: AC
  Filled 2021-01-11: qty 5

## 2021-01-11 MED ORDER — LEVALBUTEROL HCL 0.63 MG/3ML IN NEBU
0.6300 mg | INHALATION_SOLUTION | Freq: Once | RESPIRATORY_TRACT | Status: AC
  Administered 2021-01-11: 0.63 mg via RESPIRATORY_TRACT

## 2021-01-11 MED ORDER — PROPOFOL 10 MG/ML IV BOLUS
INTRAVENOUS | Status: DC | PRN
  Administered 2021-01-11: 200 mg via INTRAVENOUS

## 2021-01-11 MED ORDER — 0.9 % SODIUM CHLORIDE (POUR BTL) OPTIME
TOPICAL | Status: DC | PRN
  Administered 2021-01-11: 1000 mL

## 2021-01-11 MED ORDER — TRAMADOL HCL 50 MG PO TABS: 50.0000 mg | ORAL_TABLET | Freq: Once | ORAL | Status: DC

## 2021-01-11 MED ORDER — TRAMADOL HCL 50 MG PO TABS: 50.0000 mg | ORAL_TABLET | Freq: Four times a day (QID) | ORAL | 0 refills | Status: AC | PRN

## 2021-01-11 MED ORDER — BUPIVACAINE-EPINEPHRINE 0.25% -1:200000 IJ SOLN
INTRAMUSCULAR | Status: DC | PRN
  Administered 2021-01-11: 30 mL

## 2021-01-11 MED ORDER — LIDOCAINE 2% (20 MG/ML) 5 ML SYRINGE
INTRAMUSCULAR | Status: DC | PRN
  Administered 2021-01-11: 60 mg via INTRAVENOUS

## 2021-01-11 MED ORDER — DEXAMETHASONE SODIUM PHOSPHATE 10 MG/ML IJ SOLN
INTRAMUSCULAR | Status: DC | PRN
  Administered 2021-01-11: 10 mg via INTRAVENOUS

## 2021-01-11 MED ORDER — DEXAMETHASONE SODIUM PHOSPHATE 10 MG/ML IJ SOLN
INTRAMUSCULAR | Status: AC
  Filled 2021-01-11: qty 1

## 2021-01-11 MED ORDER — AMISULPRIDE (ANTIEMETIC) 5 MG/2ML IV SOLN
10.0000 mg | Freq: Once | INTRAVENOUS | Status: DC | PRN
Start: 2021-01-11 — End: 2021-01-11

## 2021-01-11 MED ORDER — CHLORHEXIDINE GLUCONATE CLOTH 2 % EX PADS: 6.0000 | MEDICATED_PAD | Freq: Once | CUTANEOUS | Status: DC

## 2021-01-11 MED ORDER — BUPIVACAINE LIPOSOME 1.3 % IJ SUSP
INTRAMUSCULAR | Status: DC | PRN
  Administered 2021-01-11: 20 mL

## 2021-01-11 MED ORDER — SUGAMMADEX SODIUM 200 MG/2ML IV SOLN
INTRAVENOUS | Status: DC | PRN
  Administered 2021-01-11: 500 mg via INTRAVENOUS

## 2021-01-11 MED ORDER — FENTANYL CITRATE (PF) 100 MCG/2ML IJ SOLN: 25.0000 ug | INTRAMUSCULAR | Status: DC | PRN

## 2021-01-11 MED ORDER — MIDAZOLAM HCL 5 MG/5ML IJ SOLN
INTRAMUSCULAR | Status: DC | PRN
  Administered 2021-01-11: 2 mg via INTRAVENOUS

## 2021-01-11 MED ORDER — PROPOFOL 10 MG/ML IV BOLUS
INTRAVENOUS | Status: AC
  Filled 2021-01-11: qty 40

## 2021-01-11 MED ORDER — PHENYLEPHRINE 40 MCG/ML (10ML) SYRINGE FOR IV PUSH (FOR BLOOD PRESSURE SUPPORT)
PREFILLED_SYRINGE | INTRAVENOUS | Status: DC | PRN
  Administered 2021-01-11: 80 ug via INTRAVENOUS

## 2021-01-11 MED ORDER — ONDANSETRON HCL 4 MG/2ML IJ SOLN
INTRAMUSCULAR | Status: AC
  Filled 2021-01-11: qty 2

## 2021-01-11 MED ORDER — GABAPENTIN 300 MG PO CAPS
300.0000 mg | ORAL_CAPSULE | ORAL | Status: AC
  Administered 2021-01-11: 300 mg via ORAL
  Filled 2021-01-11: qty 1

## 2021-01-11 MED ORDER — SUGAMMADEX SODIUM 500 MG/5ML IV SOLN
INTRAVENOUS | Status: AC
  Filled 2021-01-11: qty 5

## 2021-01-11 MED ORDER — LEVALBUTEROL HCL 0.63 MG/3ML IN NEBU
INHALATION_SOLUTION | RESPIRATORY_TRACT | Status: AC
  Filled 2021-01-11: qty 3

## 2021-01-11 MED ORDER — LACTATED RINGERS IV SOLN: INTRAVENOUS | Status: DC

## 2021-01-11 MED ORDER — ROCURONIUM BROMIDE 10 MG/ML (PF) SYRINGE
PREFILLED_SYRINGE | INTRAVENOUS | Status: AC
  Filled 2021-01-11: qty 20

## 2021-01-11 MED ORDER — INDOCYANINE GREEN 25 MG IV SOLR
2.5000 mg | Freq: Once | INTRAVENOUS | Status: AC
  Administered 2021-01-11: 2.5 mg via INTRAVENOUS
  Filled 2021-01-11: qty 1

## 2021-01-11 MED ORDER — BUPIVACAINE-EPINEPHRINE (PF) 0.25% -1:200000 IJ SOLN
INTRAMUSCULAR | Status: AC
  Filled 2021-01-11: qty 30

## 2021-01-11 MED ORDER — MIDAZOLAM HCL 2 MG/2ML IJ SOLN
INTRAMUSCULAR | Status: AC
  Filled 2021-01-11: qty 2

## 2021-01-11 MED ORDER — LIDOCAINE 2% (20 MG/ML) 5 ML SYRINGE
INTRAMUSCULAR | Status: AC
  Filled 2021-01-11: qty 5

## 2021-01-11 MED ORDER — ONDANSETRON HCL 4 MG/2ML IJ SOLN
INTRAMUSCULAR | Status: DC | PRN
  Administered 2021-01-11: 4 mg via INTRAVENOUS

## 2021-01-11 MED ORDER — PHENYLEPHRINE 40 MCG/ML (10ML) SYRINGE FOR IV PUSH (FOR BLOOD PRESSURE SUPPORT)
PREFILLED_SYRINGE | INTRAVENOUS | Status: AC
  Filled 2021-01-11: qty 10

## 2021-01-11 MED ORDER — DOCUSATE SODIUM 100 MG PO CAPS: 100.0000 mg | ORAL_CAPSULE | Freq: Two times a day (BID) | ORAL | 0 refills | Status: AC

## 2021-01-11 MED ORDER — TRAMADOL HCL 50 MG PO TABS
ORAL_TABLET | ORAL | Status: AC
  Filled 2021-01-11: qty 1

## 2021-01-11 MED ORDER — CEFAZOLIN IN SODIUM CHLORIDE 3-0.9 GM/100ML-% IV SOLN
3.0000 g | INTRAVENOUS | Status: AC
  Administered 2021-01-11: 3 g via INTRAVENOUS
  Filled 2021-01-11: qty 100

## 2021-01-11 MED ORDER — FENTANYL CITRATE (PF) 250 MCG/5ML IJ SOLN
INTRAMUSCULAR | Status: DC | PRN
  Administered 2021-01-11: 100 ug via INTRAVENOUS

## 2021-01-11 SURGICAL SUPPLY — 52 items
ADH SKN CLS APL DERMABOND .7 (GAUZE/BANDAGES/DRESSINGS)
APL PRP STRL LF DISP 70% ISPRP (MISCELLANEOUS) ×1
APPLIER CLIP 5 13 M/L LIGAMAX5 (MISCELLANEOUS)
APR CLP MED LRG 5 ANG JAW (MISCELLANEOUS)
BAG COUNTER SPONGE SURGICOUNT (BAG) ×2 IMPLANT
BAG SPNG CNTER NS LX DISP (BAG) ×1
BLADE SURG SZ11 CARB STEEL (BLADE) ×2 IMPLANT
CHLORAPREP W/TINT 26 (MISCELLANEOUS) ×2 IMPLANT
CLIP APPLIE 5 13 M/L LIGAMAX5 (MISCELLANEOUS) IMPLANT
CLIP VESOLOCK LG 6/CT PURPLE (CLIP) ×2 IMPLANT
COVER TIP SHEARS 8 DVNC (MISCELLANEOUS) ×1 IMPLANT
COVER TIP SHEARS 8MM DA VINCI (MISCELLANEOUS) ×2
DECANTER SPIKE VIAL GLASS SM (MISCELLANEOUS) ×2 IMPLANT
DERMABOND ADVANCED (GAUZE/BANDAGES/DRESSINGS)
DERMABOND ADVANCED .7 DNX12 (GAUZE/BANDAGES/DRESSINGS) IMPLANT
DRAPE ARM DVNC X/XI (DISPOSABLE) ×4 IMPLANT
DRAPE COLUMN DVNC XI (DISPOSABLE) ×1 IMPLANT
DRAPE DA VINCI XI ARM (DISPOSABLE) ×8
DRAPE DA VINCI XI COLUMN (DISPOSABLE) ×2
ELECT REM PT RETURN 15FT ADLT (MISCELLANEOUS) ×2 IMPLANT
GLOVE SURG ENC MOIS LTX SZ6 (GLOVE) ×4 IMPLANT
GLOVE SURG MICRO LTX SZ6 (GLOVE) ×2 IMPLANT
GLOVE SURG UNDER LTX SZ6.5 (GLOVE) ×4 IMPLANT
GOWN STRL REUS W/TWL LRG LVL3 (GOWN DISPOSABLE) ×4 IMPLANT
GOWN STRL REUS W/TWL XL LVL3 (GOWN DISPOSABLE) IMPLANT
GRASPER SUT TROCAR 14GX15 (MISCELLANEOUS) ×2 IMPLANT
KIT BASIN OR (CUSTOM PROCEDURE TRAY) ×2 IMPLANT
KIT PROCEDURE DA VINCI SI (MISCELLANEOUS)
KIT PROCEDURE DVNC SI (MISCELLANEOUS) IMPLANT
KIT TURNOVER KIT A (KITS) ×2 IMPLANT
MANIFOLD NEPTUNE II (INSTRUMENTS) ×2 IMPLANT
NDL INSUFFLATION 14GA 120MM (NEEDLE) ×1 IMPLANT
NEEDLE HYPO 22GX1.5 SAFETY (NEEDLE) ×2 IMPLANT
NEEDLE INSUFFLATION 14GA 120MM (NEEDLE) ×2 IMPLANT
PACK CARDIOVASCULAR III (CUSTOM PROCEDURE TRAY) ×2 IMPLANT
SEAL CANN UNIV 5-8 DVNC XI (MISCELLANEOUS) ×4 IMPLANT
SEAL XI 5MM-8MM UNIVERSAL (MISCELLANEOUS) ×8
SEALER VESSEL DA VINCI XI (MISCELLANEOUS)
SEALER VESSEL EXT DVNC XI (MISCELLANEOUS) IMPLANT
SET IRRIG TUBING LAPAROSCOPIC (IRRIGATION / IRRIGATOR) IMPLANT
SOL ANTI FOG 6CC (MISCELLANEOUS) ×1 IMPLANT
SOLUTION ANTI FOG 6CC (MISCELLANEOUS) ×1
SOLUTION ELECTROLUBE (MISCELLANEOUS) ×2 IMPLANT
SPONGE T-LAP 18X18 ~~LOC~~+RFID (SPONGE) ×2 IMPLANT
SUT MNCRL AB 4-0 PS2 18 (SUTURE) ×2 IMPLANT
SYR 20ML LL LF (SYRINGE) ×2 IMPLANT
SYS RETRIEVAL 5MM INZII UNIV (BASKET) ×2
SYSTEM RETRIEVL 5MM INZII UNIV (BASKET) ×1 IMPLANT
TOWEL OR 17X26 10 PK STRL BLUE (TOWEL DISPOSABLE) ×2 IMPLANT
TOWEL OR NON WOVEN STRL DISP B (DISPOSABLE) ×2 IMPLANT
TRAY FOLEY MTR SLVR 16FR STAT (SET/KITS/TRAYS/PACK) IMPLANT
TUBING INSUFFLATION 10FT LAP (TUBING) ×2 IMPLANT

## 2021-01-11 NOTE — Anesthesia Procedure Notes (Signed)
Procedure Name: Intubation Date/Time: 01/11/2021 2:03 PM Performed by: Talbot Grumbling, CRNA Pre-anesthesia Checklist: Patient identified, Emergency Drugs available, Suction available and Patient being monitored Patient Re-evaluated:Patient Re-evaluated prior to induction Oxygen Delivery Method: Circle system utilized Preoxygenation: Pre-oxygenation with 100% oxygen Induction Type: IV induction Ventilation: Mask ventilation without difficulty and Oral airway inserted - appropriate to patient size Laryngoscope Size: Mac and 3 Grade View: Grade I Tube type: Oral Tube size: 7.0 mm Number of attempts: 1 Airway Equipment and Method: Stylet Placement Confirmation: ETT inserted through vocal cords under direct vision, positive ETCO2 and breath sounds checked- equal and bilateral Secured at: 22 cm Tube secured with: Tape Dental Injury: Teeth and Oropharynx as per pre-operative assessment

## 2021-01-11 NOTE — Interval H&P Note (Signed)
History and Physical Interval Note:  01/11/2021 1:08 PM  Jaime Nichols  has presented today for surgery, with the diagnosis of BILIARY COLIC.  The various methods of treatment have been discussed with the patient and family. After consideration of risks, benefits and other options for treatment, the patient has consented to  Procedure(s): XI ROBOTIC [place] (N/A) as a surgical intervention.  The patient's history has been reviewed, patient examined, no change in status, stable for surgery.  I have reviewed the patient's chart and labs.  Questions were answered to the patient's satisfaction.     [name] [name]

## 2021-01-11 NOTE — Discharge Instructions (Signed)
POST OP INSTRUCTIONS   EAT Gradually transition to a high fiber diet with a fiber supplement over the next few weeks after discharge.  Start with a pureed / full liquid diet (see below)  WALK Walk an hour a day.  Control your pain to do that.    CONTROL PAIN Control pain so that you can walk, sleep, tolerate sneezing/coughing, go up/down stairs.  HAVE A BOWEL MOVEMENT DAILY Keep your bowels regular to avoid problems.  OK to try a laxative to override constipation.  OK to use an antidairrheal to slow down diarrhea.  Call if not better after 2 tries  CALL IF YOU HAVE PROBLEMS/CONCERNS Call if you are still struggling despite following these instructions. Call if you have concerns not answered by these instructions    DIET: Follow a light bland diet & liquids the first 24 hours after arrival home, such as soup, liquids, starches, etc.  Be sure to drink plenty of fluids.  Quickly advance to a usual solid diet within a few days.  Avoid fast food or heavy meals as your are more likely to get nauseated or have irregular bowels.  A low-sugar, high-fiber diet for the rest of your life is ideal.  Take your usually prescribed home medications unless otherwise directed.  PAIN CONTROL: Pain is best controlled by a usual combination of three different methods TOGETHER: Ice/Heat Over the counter pain medication Prescription pain medication Most patients will experience some swelling and bruising around the incisions.  Ice packs or heating pads (30-60 minutes up to 6 times a day) will help. Use ice for the first few days to help decrease swelling and bruising, then switch to heat to help relax tight/sore spots and speed recovery.  Some people prefer to use ice alone, heat alone, alternating between ice & heat.  Experiment to what works for you.  Swelling and bruising can take several weeks to resolve.   It is helpful to take an over-the-counter pain medication regularly for the first few  days: Naproxen (Aleve, etc)  Two '220mg'$  tabs twice a day OR Ibuprofen (Advil, etc) Three '200mg'$  tabs four times a day (every meal & bedtime) AND Acetaminophen (Tylenol, etc) 500-'650mg'$  four times a day (every meal & bedtime) A  prescription for pain medication (such as oxycodone, hydrocodone, tramadol, gabapentin, methocarbamol, etc) should be given to you upon discharge.  Take your pain medication as prescribed, IF NEEDED.  If you are having problems/concerns with the prescription medicine (does not control pain, nausea, vomiting, rash, itching, etc), please call us [phone] to see if we need to switch you to a different pain medicine that will work better for you and/or control your side effect better. If you need a refill on your pain medication, please give [place] 48 hour notice.  contact your pharmacy.  They will contact our office to request authorization. Prescriptions will not be filled after 5 pm or on week-ends  Avoid getting constipated.   Between the surgery and the pain medications, it is common to experience some constipation.   Increasing fluid intake and taking a fiber supplement (such as Metamucil, Citrucel, FiberCon, MiraLax, etc) 1-2 times a day regularly will usually help prevent this problem from occurring.   A mild laxative (prune juice, Milk of Magnesia, MiraLax, etc) should be taken according to package directions if there are no bowel movements after 48 hours.   Watch out for diarrhea.   If you have many loose bowel movements, simplify your diet to bland  foods & liquids for a few days.   Stop any stool softeners and decrease your fiber supplement.   Switching to mild anti-diarrheal medications (Kayopectate, Pepto Bismol) can help.   If this worsens or does not improve, please call us.  Wash / shower every day.  You may shower over the skin glue which is waterproof  Glue will flake off after about 2 weeks.  You may leave the incision open to air.  You may replace a  dressing/Band-Aid to cover the incision for comfort if you wish.   ACTIVITIES as tolerated:   You may resume regular (light) daily activities beginning the next day--such as daily self-care, walking, climbing stairs--gradually increasing activities as tolerated.  If you can walk 30 minutes without difficulty, it is safe to try more intense activity such as jogging, treadmill, bicycling, low-impact aerobics, swimming, etc. Save the most intensive and strenuous activity for last such as sit-ups, heavy lifting, contact sports, etc  Refrain from any heavy lifting or straining until you are off narcotics for pain control.   DO NOT PUSH THROUGH PAIN.  Let pain be your guide: If it hurts to do something, don't do it.  Pain is your body warning you to avoid that activity for another week until the pain goes down. You may drive when you are no longer taking prescription pain medication, you can comfortably wear a seatbelt, and you can safely maneuver your car and apply brakes. You may have sexual intercourse when it is comfortable.  FOLLOW UP in our office Please call [facility] at [phone] to set up an appointment to see your surgeon in the office for a follow-up appointment approximately 2-3 weeks after your surgery. Make sure that you call for this appointment the day you arrive home to insure a convenient appointment time.  10. IF YOU HAVE DISABILITY OR FAMILY LEAVE FORMS, BRING THEM TO THE OFFICE FOR PROCESSING.  DO NOT GIVE THEM TO YOUR DOCTOR.   WHEN TO CALL us [phone]: Poor pain control Reactions / problems with new medications (rash/itching, nausea, etc)  Fever over 101.5 F (38.5 C) Inability to urinate Nausea and/or vomiting Worsening swelling or bruising Continued bleeding from incision. Increased pain, redness, or drainage from the incision   The clinic staff is available to answer your questions during regular business hours (8:30am-5pm).  Please don't hesitate to call and ask to  speak to one of our nurses for clinical concerns.   If you have a medical emergency, go to the nearest emergency room or call 911.  A surgeon from [facility] is always on call at the [facility], [place], [place], [place], [place]  [address] ? MAIN: [phone] ? TOLL FREE: [phone] ?  FAX [phone] [email or web address]

## 2021-01-11 NOTE — Transfer of Care (Signed)
Immediate Anesthesia Transfer of Care Note  Patient: Jaime Nichols  Procedure(s) Performed: XI ROBOTIC ASSISTED LAPAROSCOPIC CHOLECYSTECTOMY (Abdomen)  Patient Location: PACU  Anesthesia Type:General  Level of Consciousness: sedated  Airway & Oxygen Therapy: Patient Spontanous Breathing and Patient connected to face mask oxygen  Post-op Assessment: Report given to RN and Post -op Vital signs reviewed and stable  Post vital signs: Reviewed and stable  Last Vitals:  Vitals Value Taken Time  BP 149/78 01/11/21 1534  Temp    Pulse 91 01/11/21 1535  Resp 15 01/11/21 1535  SpO2 95 % 01/11/21 1535  Vitals shown include unvalidated device data.  Last Pain:  Vitals:   01/11/21 1146  TempSrc:   PainSc: 0-No pain         Complications: No notable events documented.

## 2021-01-11 NOTE — Op Note (Signed)
Operative Note  Pacey Brace Wimer  PL:[id number]  OF:[id number]  01/11/2021   Surgeon: Romana Juniper MD FACS   Assistant: Kaylyn Lim MD FACS   Procedure performed: Robotic cholecystectomy   Preop diagnosis: Biliary colic Post-op diagnosis/intraop findings: Chronic cholecystitis, enlarged liver with hepatosteatosis   Specimens: Gallbladder Retained items: No EBL: Minimal cc Complications: none   Description of procedure:  After obtaining informed consent the patient was brought to the operating room. Antibiotics were administered. SCD's were applied. General endotracheal anesthesia was initiated and a formal time-out was performed. The abdomen was prepped and draped in the usual sterile fashion and peritoneal access gained using a left subcostal veress needle after instilling the site with local. Insufflation to 89mHg was obtained, supraumbilical 848mtrocar and camera inserted, and gross inspection revealed no evidence of injury from our entry or other intraabdominal abnormalities.  Bilateral laparoscopic assisted taps blocks were performed with Exparel mixed with quarter percent Marcaine.  Three additional 8 mm robotictrocars were introduced in the right and left midclavicular and right anterior axillary lines under direct visualization and following infiltration with local.  The robot was then docked and instruments inserted under direct visualization.   The gallbladder was retracted cephalad and the infundibulum was retracted laterally. A combination of hook electrocautery and blunt dissection was utilized to clear omental adhesions and the peritoneum from the neck and cystic duct, circumferentially isolating the cystic artery and cystic duct and lifting the gallbladder from the cystic plate. The critical view of safety was achieved with the cystic artery, cystic duct, and liver bed visualized between them with no other structures.  This was concordant with the view on firefly.  The artery was  clipped with a single clip proximally and distally and divided as was the cystic duct with three clips on the proximal end. The gallbladder was dissected from the liver plate using electrocautery.  Hemostasis along the liver bed was ensured with cautery as the dissection progressed. Once freed the gallbladder was placed in an endocatch bag and removed intact through the left upper quadrant trocar site.  Hemostasis was once again confirmed, and reinspection of the abdomen revealed no injuries. The clips were well opposed without any bile leak from the duct or the liver bed on direct visualization nor on firefly. The left upper quadrant extraction port site was closed with a 0 vicryl in the fascia under direct visualization using a PMI device. The abdomen was desufflated and all trocars removed. The skin incisions were closed with subcuticular 4-0 monocryl and Dermabond. The patient was awakened, extubated and transported to the recovery room in stable condition.    All counts were correct at the completion of the case.

## 2021-01-13 ENCOUNTER — Other Ambulatory Visit: Payer: Self-pay | Admitting: Cardiology

## 2021-01-13 LAB — SURGICAL PATHOLOGY

## 2021-01-13 NOTE — Anesthesia Postprocedure Evaluation (Signed)
Anesthesia Post Note  Patient: Jaime Nichols  Procedure(s) Performed: XI ROBOTIC ASSISTED LAPAROSCOPIC CHOLECYSTECTOMY (Abdomen)     Patient location during evaluation: PACU Anesthesia Type: General Level of consciousness: awake and alert Pain management: pain level controlled Vital Signs Assessment: post-procedure vital signs reviewed and stable Respiratory status: spontaneous breathing, nonlabored ventilation, respiratory function stable and patient connected to nasal cannula oxygen Cardiovascular status: blood pressure returned to baseline and stable Postop Assessment: no apparent nausea or vomiting Anesthetic complications: no   No notable events documented.  Last Vitals:  Vitals:   01/11/21 1645 01/11/21 1703  BP: 130/78 (!) 147/69  Pulse: 87 85  Resp: (!) 27   Temp:  36.7 C  SpO2: (!) 87% 90%    Last Pain:  Vitals:   01/11/21 1703  TempSrc:   PainSc: 3                  Tiajuana Amass

## 2021-01-16 IMAGING — DX DG CHEST 2V
2 series · 2 of 2 positions shown · non-contrast
Comparison: 08/16/2017

CLINICAL DATA: Shortness of breath

EXAM:
CHEST - 2 VIEW

[chest pa]
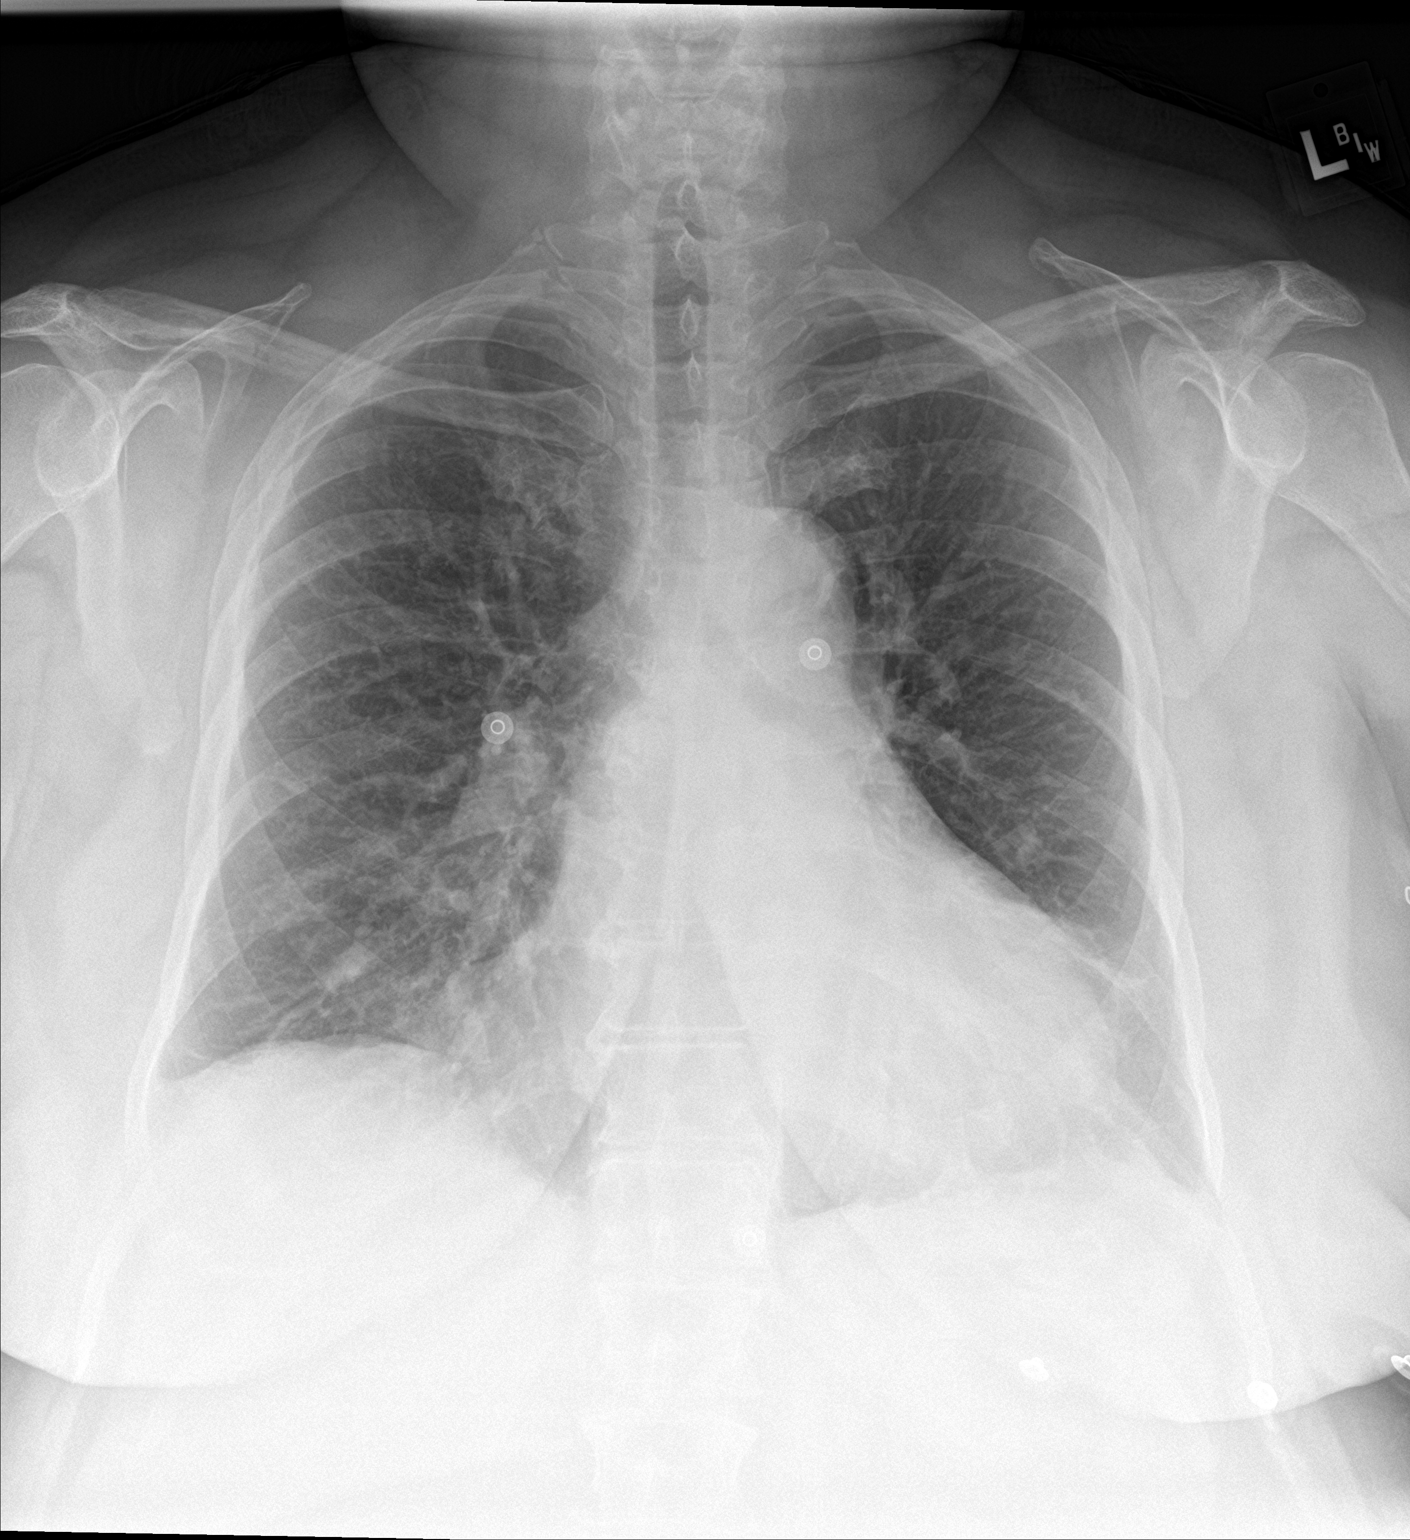

[chest lat]
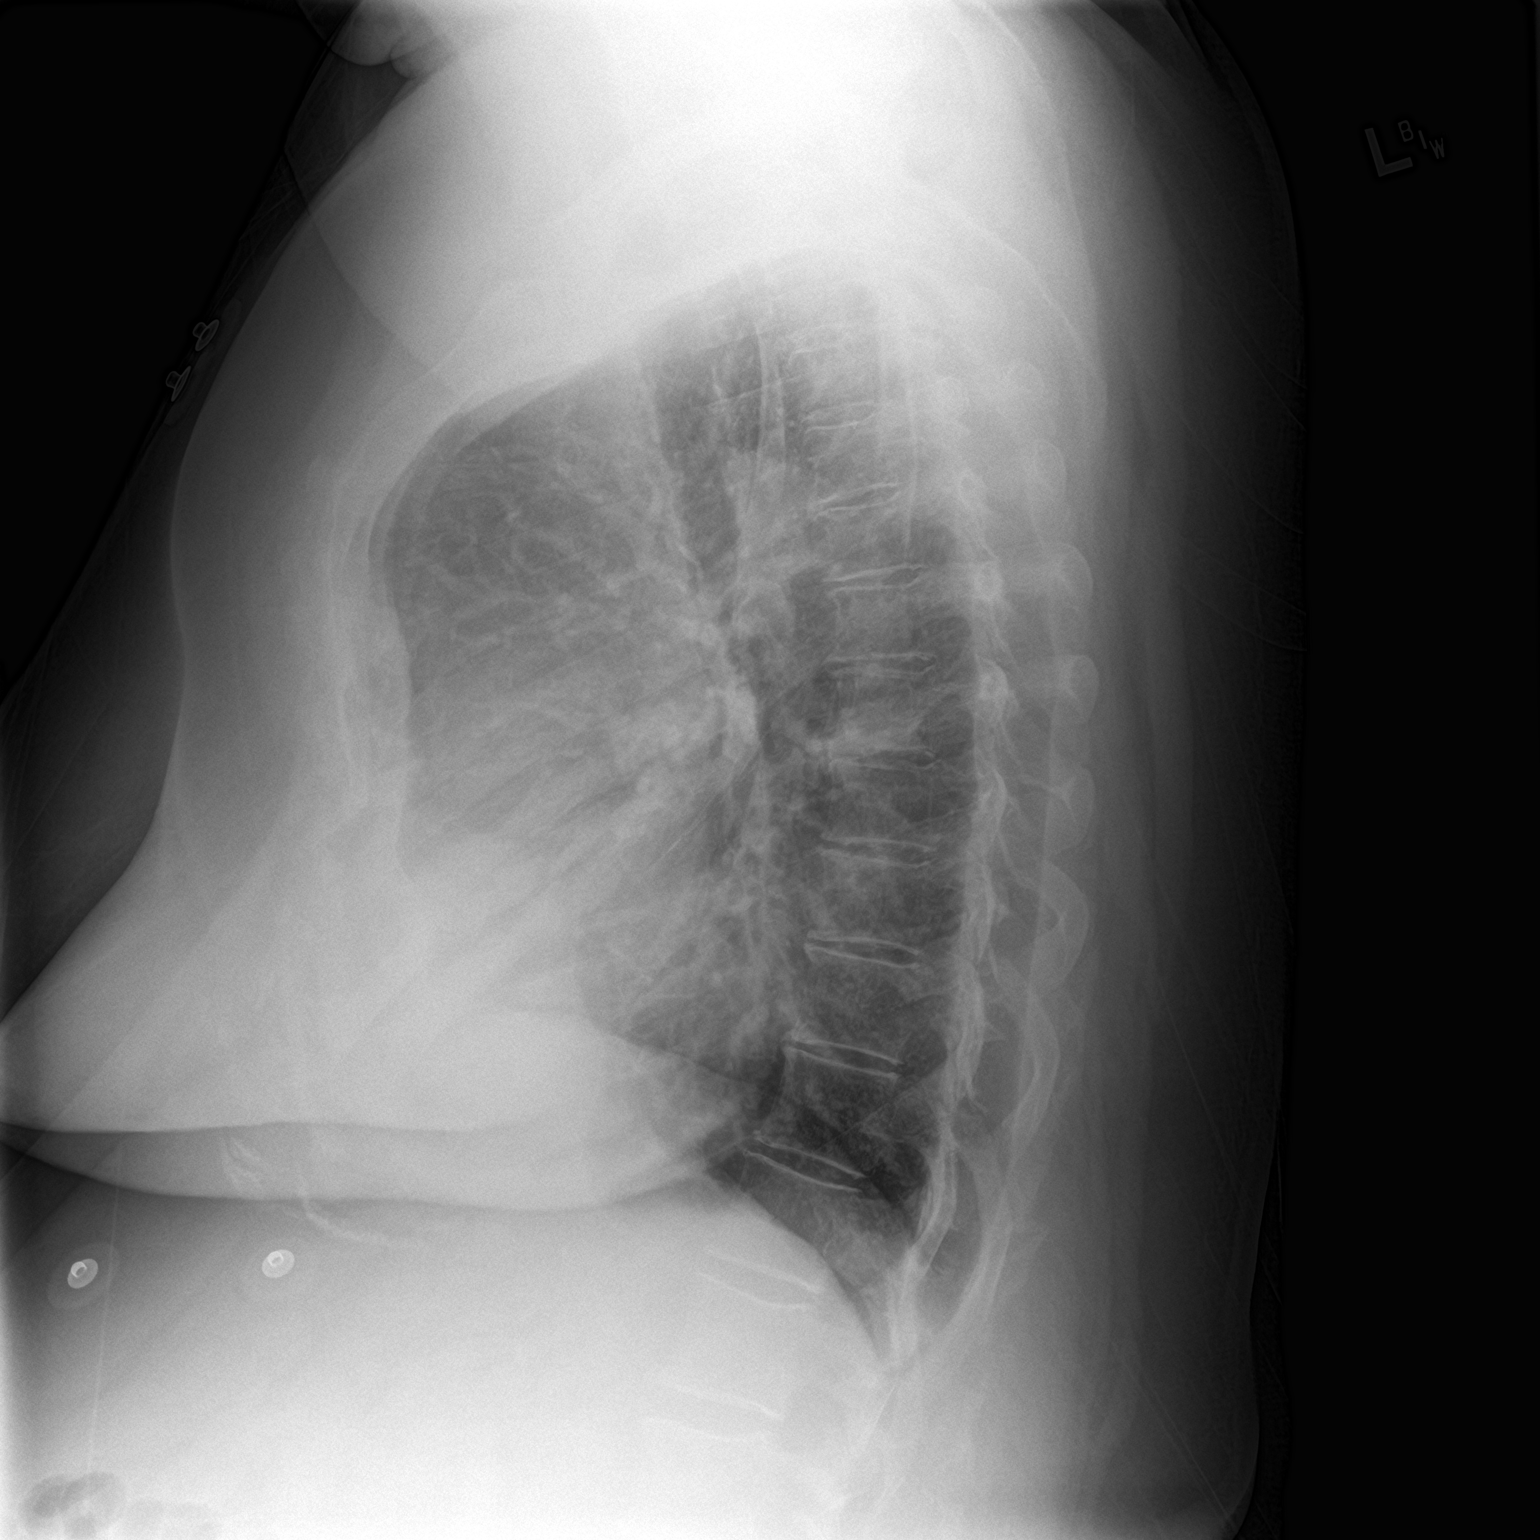

[2 of 2 positions shown; findings below may reference images not displayed]

FINDINGS: Cardiac shadow is enlarged but stable. Aortic calcifications are
seen. The lungs are well aerated bilaterally. No focal infiltrate or
sizable effusion is noted. Calcified hilar and mediastinal lymph
nodes are noted. No other focal abnormality is seen.
IMPRESSION: No active cardiopulmonary disease.

## 2021-01-25 DIAGNOSIS — N209 Urinary calculus, unspecified: Secondary | ICD-10-CM | POA: Diagnosis not present

## 2021-01-25 DIAGNOSIS — Z8639 Personal history of other endocrine, nutritional and metabolic disease: Secondary | ICD-10-CM | POA: Diagnosis not present

## 2021-01-25 DIAGNOSIS — E538 Deficiency of other specified B group vitamins: Secondary | ICD-10-CM | POA: Diagnosis not present

## 2021-01-25 DIAGNOSIS — E119 Type 2 diabetes mellitus without complications: Secondary | ICD-10-CM | POA: Diagnosis not present

## 2021-02-04 DIAGNOSIS — I10 Essential (primary) hypertension: Secondary | ICD-10-CM | POA: Diagnosis not present

## 2021-02-04 DIAGNOSIS — M10072 Idiopathic gout, left ankle and foot: Secondary | ICD-10-CM | POA: Diagnosis not present

## 2021-02-04 DIAGNOSIS — G4733 Obstructive sleep apnea (adult) (pediatric): Secondary | ICD-10-CM | POA: Diagnosis not present

## 2021-02-04 DIAGNOSIS — E119 Type 2 diabetes mellitus without complications: Secondary | ICD-10-CM | POA: Diagnosis not present

## 2021-02-24 DIAGNOSIS — H0015 Chalazion left lower eyelid: Secondary | ICD-10-CM | POA: Diagnosis not present

## 2021-03-01 DIAGNOSIS — Z713 Dietary counseling and surveillance: Secondary | ICD-10-CM | POA: Diagnosis not present

## 2021-03-03 DIAGNOSIS — E538 Deficiency of other specified B group vitamins: Secondary | ICD-10-CM | POA: Diagnosis not present

## 2021-03-18 ENCOUNTER — Telehealth: Payer: Self-pay | Admitting: *Deleted

## 2021-03-18 NOTE — Telephone Encounter (Signed)
Left message for pt to call the office, as per Dr. Radford Pax conflict in schedule for 03/19/21. Per Dr. Radford Pax please reschedule pt to another. 03/23/21 schedule for Dr. Radford Pax has 2 72 hour time slots where pt can be rescheduled to. Left message for the pt to call back ASAP.

## 2021-03-18 NOTE — Telephone Encounter (Signed)
I have reached back to the pt and left message on both numbers for her that the appt with Dr. Radford Pax is still set for tomorrow. Advised no change in her appt for tomorrow and that we will see her with Dr. Radford Pax 03/19/21 @ 11 am. If any questions please call the office.

## 2021-03-19 ENCOUNTER — Ambulatory Visit: Payer: Self-pay | Admitting: Cardiology

## 2021-03-23 DIAGNOSIS — R197 Diarrhea, unspecified: Secondary | ICD-10-CM | POA: Diagnosis not present

## 2021-03-23 DIAGNOSIS — D120 Benign neoplasm of cecum: Secondary | ICD-10-CM | POA: Diagnosis not present

## 2021-03-23 DIAGNOSIS — K649 Unspecified hemorrhoids: Secondary | ICD-10-CM | POA: Diagnosis not present

## 2021-04-06 DIAGNOSIS — M10072 Idiopathic gout, left ankle and foot: Secondary | ICD-10-CM | POA: Diagnosis not present

## 2021-04-08 DIAGNOSIS — Z713 Dietary counseling and surveillance: Secondary | ICD-10-CM | POA: Diagnosis not present

## 2021-04-27 DIAGNOSIS — Z8639 Personal history of other endocrine, nutritional and metabolic disease: Secondary | ICD-10-CM | POA: Diagnosis not present

## 2021-04-27 DIAGNOSIS — I509 Heart failure, unspecified: Secondary | ICD-10-CM | POA: Diagnosis not present

## 2021-04-27 DIAGNOSIS — N209 Urinary calculus, unspecified: Secondary | ICD-10-CM | POA: Diagnosis not present

## 2021-04-27 DIAGNOSIS — E119 Type 2 diabetes mellitus without complications: Secondary | ICD-10-CM | POA: Diagnosis not present

## 2021-05-04 ENCOUNTER — Encounter: Payer: Self-pay | Admitting: Cardiology

## 2021-05-04 ENCOUNTER — Other Ambulatory Visit: Payer: Self-pay

## 2021-05-04 ENCOUNTER — Ambulatory Visit (INDEPENDENT_AMBULATORY_CARE_PROVIDER_SITE_OTHER): Payer: BC Managed Care – PPO | Admitting: Cardiology

## 2021-05-04 VITALS — BP 126/80 | HR 96 | Ht 63.0 in | Wt 260.6 lb

## 2021-05-04 DIAGNOSIS — I10 Essential (primary) hypertension: Secondary | ICD-10-CM | POA: Diagnosis not present

## 2021-05-04 DIAGNOSIS — G4733 Obstructive sleep apnea (adult) (pediatric): Secondary | ICD-10-CM

## 2021-05-04 DIAGNOSIS — I2510 Atherosclerotic heart disease of native coronary artery without angina pectoris: Secondary | ICD-10-CM

## 2021-05-04 DIAGNOSIS — I5032 Chronic diastolic (congestive) heart failure: Secondary | ICD-10-CM

## 2021-05-04 DIAGNOSIS — Z9989 Dependence on other enabling machines and devices: Secondary | ICD-10-CM

## 2021-05-04 DIAGNOSIS — I471 Supraventricular tachycardia: Secondary | ICD-10-CM | POA: Diagnosis not present

## 2021-05-04 MED ORDER — METOPROLOL SUCCINATE ER 25 MG PO TB24: 25.0000 mg | ORAL_TABLET | Freq: Every day | ORAL | 3 refills | Status: DC

## 2021-05-04 MED ORDER — DILTIAZEM HCL ER COATED BEADS 120 MG PO CP24: ORAL_CAPSULE | ORAL | 3 refills | Status: DC

## 2021-05-04 MED ORDER — LISINOPRIL 10 MG PO TABS: 10.0000 mg | ORAL_TABLET | Freq: Every day | ORAL | 3 refills | Status: AC

## 2021-05-04 MED ORDER — REPATHA SURECLICK 140 MG/ML ~~LOC~~ SOAJ: 1.0000 "pen " | SUBCUTANEOUS | 11 refills | Status: DC

## 2021-05-04 NOTE — Patient Instructions (Signed)

## 2021-05-04 NOTE — Addendum Note (Signed)
Addended by: Antonieta Iba on: 05/04/2021 01:21 PM   Modules accepted: Orders

## 2021-05-04 NOTE — Progress Notes (Signed)
Cardiology Office Note:    Date:  05/04/2021   ID:  Jaime Nichols, DOB [birth year], MRN [id number]  PCP:  Cari Caraway, MD  Cardiologist:  Fransico Him, MD    Referring MD: Cari Caraway, MD   Chief Complaint  Patient presents with   Follow-up    PAT, CHF, hypertension, OSA     History of Present Illness:    Jaime Nichols is a 60 y.o. female with a hx of OSA on CPAP, obesity, chronic diastolic CHF, PAT and HTN. She has chronic SOB that is multifactorial from obesity, pectus excavatum and possible asthma, followed by Dr. Milton Ferguson.   She is here today for followup and is doing well.  She denies any chest pain or pressure, PND, orthopnea, no significant LE edema, palpitations or syncope. She still has problems with DOE and actually went to there ER with complaints of SOB although there is no documentation of this.  She is compliant with her meds and is tolerating meds with no SE.   She had to go off Crestor due to interaction with Colchicine.  Last LDL was at goal at 49.  She is doing well with her CPAP device and thinks that she has gotten used to it.  She tolerates the mask and feels the pressure is adequate.  Since going on CPAP she feels rested in the am and has no significant daytime sleepiness.  She denies any significant mouth or nasal dryness or nasal congestion.  She does not think that he snores.     Past Medical History:  Diagnosis Date   Aortic atherosclerosis ([place])    CAD in native artery    very high calcium score at 875.  coronary CTA  showed 30% RCA and LAD.   Chronic diastolic CHF (congestive heart failure) ([place]) 2012   Diabetes mellitus type 2 in obese ([place])    Dyspnea    with exertion   Dysrhythmia    PVCs, PAC   Fibroid    History of kidney stones 09/2019   HTN (hypertension)    Hyperlipidemia    Morbid obesity ([facility])    OSA on CPAP    PAT (paroxysmal atrial tachycardia) ([facility])    Premature atrial contractions    PVC's (premature ventricular  contractions)     Past Surgical History:  Procedure Laterality Date   ABDOMINAL HYSTERECTOMY  06/20/2008   BSO   Benign uterine polyps  01/18/2009   DILATION AND CURETTAGE OF UTERUS  2010   HYSTEROSCOPY  2010   URETEROSCOPY WITH HOLMIUM LASER LITHOTRIPSY Left 2021    Current Medications: Current Meds  Medication Sig   allopurinol (ZYLOPRIM) 100 MG tablet Take 100 mg by mouth daily.   cholecalciferol (VITAMIN D3) 25 MCG (1000 UNIT) tablet Take 1,000 Units by mouth daily.   colchicine 0.6 MG tablet Take 0.6 mg by mouth every Monday, Wednesday, and Friday.   dapagliflozin propanediol (FARXIGA) 5 MG TABS tablet Take by mouth daily.   diltiazem (CARDIZEM CD) 120 MG 24 hr capsule TAKE 1 CAPSULE BY MOUTH EVERY DAY   Dulaglutide 1.5 MG/0.5ML SOPN Inject 1.5 mg into the skin every Monday.   estradiol (ESTRACE) 0.1 MG/GM vaginal cream Place 1 Applicatorful vaginally every 14 (fourteen) days.   levalbuterol (XOPENEX HFA) 45 MCG/ACT inhaler Inhale 1 puff into the lungs every 4 (four) hours as needed for shortness of breath.   lisinopril (ZESTRIL) 10 MG tablet Take 10 mg by mouth daily.   metoprolol succinate (TOPROL-XL) 25 MG  24 hr tablet TAKE 1 TABLET BY MOUTH EVERY DAY   NON FORMULARY Pt uses cpap nightly   potassium citrate (UROCIT-K) 10 MEQ (1080 MG) SR tablet Take 10 mEq by mouth daily.   REPATHA SURECLICK 409 MG/ML SOAJ INJECT 1 PEN INTO THE SKIN EVERY 14 (FOURTEEN) DAYS.     Allergies:   Erythromycin, Latex, Nickel, and Statins   Social History   Socioeconomic History   Marital status: Married    Spouse name: Not on file   Number of children: Not on file   Years of education: Not on file   Highest education level: Not on file  Occupational History   Not on file  Tobacco Use   Smoking status: Never   Smokeless tobacco: Never  Vaping Use   Vaping Use: Never used  Substance and Sexual Activity   Alcohol use: Yes    Comment: occ.   Drug use: No   Sexual activity: Yes     Partners: Male    Birth control/protection: Surgical    Comment: hyst  Other Topics Concern   Not on file  Social History Narrative   Not on file   Social Determinants of Health   Financial Resource Strain: Not on file  Food Insecurity: Not on file  Transportation Needs: Not on file  Physical Activity: Not on file  Stress: Not on file  Social Connections: Not on file     Family History: The patient's family history includes Arrhythmia in her sister and another family member; Depression in her brother; Diabetes in her paternal grandmother; Healthy in her sister and sister; Heart disease in her father; Hypertension in her mother.  ROS:   Please see the history of present illness.    ROS  All other systems reviewed and negative.   EKGs/Labs/Other Studies Reviewed:    The following studies were reviewed today: PAP compliance download  EKG:  EKG is  ordered today.  The ekg ordered today demonstrates NSR with iRBBB with anterior infarct - unchanged from EKG 03/2020  Recent Labs: 01/05/2021: BUN 12; Creatinine, Ser 0.67; Hemoglobin 13.8; Platelets 301; Potassium 4.1; Sodium 140   Recent Lipid Panel    Component Value Date/Time   CHOL 130 12/02/2020 1057   TRIG 212 (H) 12/02/2020 1057   HDL 47 12/02/2020 1057   CHOLHDL 2.8 12/02/2020 1057   LDLCALC 49 12/02/2020 1057    Physical Exam:    VS:  BP 126/80   Pulse 96   Ht 5\' 3"  (1.6 m)   Wt 260 lb 9.6 oz (118.2 kg)   SpO2 96%   BMI 46.16 kg/m     Wt Readings from Last 3 Encounters:  05/04/21 260 lb 9.6 oz (118.2 kg)  01/11/21 266 lb 15.6 oz (121.1 kg)  01/05/21 267 lb (121.1 kg)    GEN: Well nourished, well developed in no acute distress HEENT: Normal NECK: No JVD; No carotid bruits LYMPHATICS: No lymphadenopathy CARDIAC:RRR, no murmurs, rubs, gallops RESPIRATORY:  Clear to auscultation without rales, wheezing or rhonchi  ABDOMEN: Soft, non-tender, non-distended MUSCULOSKELETAL:  No edema; No deformity  SKIN:  Warm and dry NEUROLOGIC:  Alert and oriented x 3 PSYCHIATRIC:  Normal affect    ASSESSMENT:    1. Chronic diastolic CHF (congestive heart failure) ([place])   2. Primary hypertension   3. PAT (paroxysmal atrial tachycardia) ([place])   4. OSA on CPAP   5. Morbid obesity ([place])   6. Arteriosclerotic cardiovascular disease (ASCVD)    PLAN:  In order of problems listed above:  1.  Chronic diastolic CHF  -She does not appear volume overloaded on exam today -she has chronic SOB related to allergies, Obesity and asthma  -I think a large part of her DOE is related to obesity and deconditioning -her weight is stable and has actually lost 7 lbs since the summer -BNP on 03/23/2020 done for shortness of breath was normal   2.  Hypertension  -BP is well controlled on exam today -Continue prescription drug management with Toprol-XL 25 mg daily, Cardizem CD 120 mg daily and lisinopril 10 mg daily with as needed refills continue on Toprol XL 25mg  daily, Cardizem CD 120mg  daily and Lisinopril 10mg  daily -Serum creatinine done 01/05/2021 showed a creatinine of 0.67 and potassium 4.1   3.  PAT  -Repeat event monitor 04/2020 showed occasional PVCs -BP's are controlled on beta-blocker and CCB -Continue prescription drug management Cardizem CD 120 mg daily and Toprol-XL 25 mg daily with as needed refills continue Cardizem CD 120mg  daily, Toprol XL 25mg  daily   4.  OSA - The patient is tolerating PAP therapy well without any problems.  I will get a download from her DME the patient has been using and benefiting from PAP use and will continue to benefit from therapy.     5.  Morbid Obesity  -I have encouraged her to get into a routine exercise program and cut back on carbs and portions.    6. Type 2 diabetes mellitus  - this is followed by her PCP.  -Her last hemoglobin A1c was 6.3 on 07/12/2019 -continue Metformin XR 500 mg daily 2 tablets twice daily and glimepiride 4 mg 1/2 tablet daily.   7.  ASCAD -coronary CTA  showed 30% RCA and LAD.   -She denies any anginal symptoms -continue ASA and statin -Nuclear stress test 2019 and 2021 showed no ischemia   8.  Hyperlipidemia  -her LDL goal is less than 70.   -LDL 49 in June 2022 -She does not tolerate higher doses of statins.   -Continue prescription drug management with Repatha as needed refill  -Her LDL is at goal and she has to be on colchicine long term so will not restart statin    Medication Adjustments/Labs and Tests Ordered: Current medicines are reviewed at length with the patient today.  Concerns regarding medicines are outlined above.  Orders Placed This Encounter  Procedures   EKG 12-Lead    No orders of the defined types were placed in this encounter.   Signed, Fransico Him, MD  05/04/2021 1:09 PM    [place]

## 2021-05-05 ENCOUNTER — Ambulatory Visit: Payer: Self-pay | Admitting: Cardiology

## 2021-05-27 DIAGNOSIS — E119 Type 2 diabetes mellitus without complications: Secondary | ICD-10-CM | POA: Diagnosis not present

## 2021-05-27 DIAGNOSIS — N209 Urinary calculus, unspecified: Secondary | ICD-10-CM | POA: Diagnosis not present

## 2021-05-27 DIAGNOSIS — Z23 Encounter for immunization: Secondary | ICD-10-CM | POA: Diagnosis not present

## 2021-05-27 DIAGNOSIS — R809 Proteinuria, unspecified: Secondary | ICD-10-CM | POA: Diagnosis not present

## 2021-05-27 DIAGNOSIS — Z8639 Personal history of other endocrine, nutritional and metabolic disease: Secondary | ICD-10-CM | POA: Diagnosis not present

## 2021-06-03 ENCOUNTER — Encounter: Payer: Self-pay | Admitting: Podiatry

## 2021-06-03 ENCOUNTER — Ambulatory Visit (INDEPENDENT_AMBULATORY_CARE_PROVIDER_SITE_OTHER): Payer: BC Managed Care – PPO | Admitting: Podiatry

## 2021-06-03 ENCOUNTER — Other Ambulatory Visit: Payer: Self-pay

## 2021-06-03 DIAGNOSIS — L603 Nail dystrophy: Secondary | ICD-10-CM | POA: Diagnosis not present

## 2021-06-03 DIAGNOSIS — B351 Tinea unguium: Secondary | ICD-10-CM | POA: Diagnosis not present

## 2021-06-04 NOTE — Progress Notes (Signed)
Subjective:   Patient ID: Jaime Nichols, female   DOB: 60 y.o.   MRN: [id number]   HPI Patient presents concerned about pain in the right third nail that is been thickened with several other nails developing some mild changes.  She is concerned about this and wants to know what possible treatments can be rendered to try to help this condition   Review of Systems  All other systems reviewed and are negative.      Objective:  Physical Exam Vitals and nursing note reviewed.  Constitutional:      Appearance: She is well-developed.  Pulmonary:     Effort: Pulmonary effort is normal.  Musculoskeletal:        General: Normal range of motion.  Skin:    General: Skin is warm.  Neurological:     Mental Status: She is alert.    Neurovascular status was found to be intact muscle strength was found to be adequate range of motion is adequate.  Patient has a enlarged third nail bed right with adjacent nails also showing some discoloration with patient frustrated by the condition with redness in the proximal portion of the nailbed.  Patient is found to have good digital perfusion well oriented x3     Assessment:  Possibility for a fungal infection of the right third nail with adjacent nails or possibility this is trauma or combination with possible trauma first leading to fungal infection     Plan:  H&P conditions reviewed.  I have recommended the utilization of medication possibly and possibly laser or possibly removal of the nail educated her on this deformity and the treatment options available.  At this point I did go ahead and I debrided nailbed worker to send this off for pathology and try to get a better understanding of condition

## 2021-06-08 DIAGNOSIS — M79645 Pain in left finger(s): Secondary | ICD-10-CM | POA: Diagnosis not present

## 2021-06-08 DIAGNOSIS — M109 Gout, unspecified: Secondary | ICD-10-CM | POA: Diagnosis not present

## 2021-06-08 DIAGNOSIS — M19042 Primary osteoarthritis, left hand: Secondary | ICD-10-CM | POA: Diagnosis not present

## 2021-06-22 ENCOUNTER — Telehealth: Payer: Self-pay | Admitting: Podiatry

## 2021-06-22 NOTE — Telephone Encounter (Signed)
Patient calling to get culture results for her nails. Please advise

## 2021-06-23 NOTE — Telephone Encounter (Signed)
Was positive for fungus. I would have her make appointment to discuss options

## 2021-06-28 DIAGNOSIS — N200 Calculus of kidney: Secondary | ICD-10-CM | POA: Diagnosis not present

## 2021-07-01 ENCOUNTER — Encounter (HOSPITAL_BASED_OUTPATIENT_CLINIC_OR_DEPARTMENT_OTHER): Payer: Self-pay

## 2021-07-01 ENCOUNTER — Emergency Department (HOSPITAL_BASED_OUTPATIENT_CLINIC_OR_DEPARTMENT_OTHER): Payer: BC Managed Care – PPO

## 2021-07-01 ENCOUNTER — Other Ambulatory Visit: Payer: Self-pay

## 2021-07-01 ENCOUNTER — Ambulatory Visit (INDEPENDENT_AMBULATORY_CARE_PROVIDER_SITE_OTHER): Payer: BC Managed Care – PPO | Admitting: Podiatry

## 2021-07-01 ENCOUNTER — Inpatient Hospital Stay (HOSPITAL_BASED_OUTPATIENT_CLINIC_OR_DEPARTMENT_OTHER)
Admission: EM | Admit: 2021-07-01 | Discharge: 2021-07-03 | DRG: 854 | Disposition: A | Discharge status: Home / Self Care | Payer: BC Managed Care – PPO | Attending: Internal Medicine | Admitting: Internal Medicine

## 2021-07-01 DIAGNOSIS — A419 Sepsis, unspecified organism: Principal | ICD-10-CM | POA: Diagnosis present

## 2021-07-01 DIAGNOSIS — N201 Calculus of ureter: Secondary | ICD-10-CM | POA: Diagnosis not present

## 2021-07-01 DIAGNOSIS — Z90710 Acquired absence of both cervix and uterus: Secondary | ICD-10-CM

## 2021-07-01 DIAGNOSIS — I700 Atherosclerosis of aorta: Secondary | ICD-10-CM | POA: Diagnosis present

## 2021-07-01 DIAGNOSIS — Z881 Allergy status to other antibiotic agents status: Secondary | ICD-10-CM | POA: Diagnosis not present

## 2021-07-01 DIAGNOSIS — Z20822 Contact with and (suspected) exposure to covid-19: Secondary | ICD-10-CM | POA: Diagnosis present

## 2021-07-01 DIAGNOSIS — G4733 Obstructive sleep apnea (adult) (pediatric): Secondary | ICD-10-CM | POA: Diagnosis not present

## 2021-07-01 DIAGNOSIS — I10 Essential (primary) hypertension: Secondary | ICD-10-CM | POA: Diagnosis present

## 2021-07-01 DIAGNOSIS — Z833 Family history of diabetes mellitus: Secondary | ICD-10-CM

## 2021-07-01 DIAGNOSIS — Z7984 Long term (current) use of oral hypoglycemic drugs: Secondary | ICD-10-CM | POA: Diagnosis not present

## 2021-07-01 DIAGNOSIS — Z8249 Family history of ischemic heart disease and other diseases of the circulatory system: Secondary | ICD-10-CM

## 2021-07-01 DIAGNOSIS — R109 Unspecified abdominal pain: Secondary | ICD-10-CM | POA: Diagnosis not present

## 2021-07-01 DIAGNOSIS — N200 Calculus of kidney: Secondary | ICD-10-CM | POA: Diagnosis not present

## 2021-07-01 DIAGNOSIS — E785 Hyperlipidemia, unspecified: Secondary | ICD-10-CM | POA: Diagnosis not present

## 2021-07-01 DIAGNOSIS — Z91040 Latex allergy status: Secondary | ICD-10-CM | POA: Diagnosis not present

## 2021-07-01 DIAGNOSIS — Z9049 Acquired absence of other specified parts of digestive tract: Secondary | ICD-10-CM | POA: Diagnosis not present

## 2021-07-01 DIAGNOSIS — Z6841 Body Mass Index (BMI) 40.0 and over, adult: Secondary | ICD-10-CM | POA: Diagnosis not present

## 2021-07-01 DIAGNOSIS — I471 Supraventricular tachycardia: Secondary | ICD-10-CM | POA: Diagnosis present

## 2021-07-01 DIAGNOSIS — B351 Tinea unguium: Secondary | ICD-10-CM | POA: Diagnosis not present

## 2021-07-01 DIAGNOSIS — E1165 Type 2 diabetes mellitus with hyperglycemia: Secondary | ICD-10-CM | POA: Diagnosis present

## 2021-07-01 DIAGNOSIS — Z888 Allergy status to other drugs, medicaments and biological substances status: Secondary | ICD-10-CM | POA: Diagnosis not present

## 2021-07-01 DIAGNOSIS — N390 Urinary tract infection, site not specified: Secondary | ICD-10-CM | POA: Diagnosis not present

## 2021-07-01 DIAGNOSIS — R9431 Abnormal electrocardiogram [ECG] [EKG]: Secondary | ICD-10-CM

## 2021-07-01 DIAGNOSIS — Z818 Family history of other mental and behavioral disorders: Secondary | ICD-10-CM | POA: Diagnosis not present

## 2021-07-01 DIAGNOSIS — Z79899 Other long term (current) drug therapy: Secondary | ICD-10-CM | POA: Diagnosis not present

## 2021-07-01 DIAGNOSIS — I5032 Chronic diastolic (congestive) heart failure: Secondary | ICD-10-CM | POA: Diagnosis present

## 2021-07-01 DIAGNOSIS — M109 Gout, unspecified: Secondary | ICD-10-CM | POA: Diagnosis not present

## 2021-07-01 DIAGNOSIS — N136 Pyonephrosis: Secondary | ICD-10-CM | POA: Diagnosis present

## 2021-07-01 DIAGNOSIS — I110 Hypertensive heart disease with heart failure: Secondary | ICD-10-CM | POA: Diagnosis not present

## 2021-07-01 DIAGNOSIS — I4719 Other supraventricular tachycardia: Secondary | ICD-10-CM | POA: Diagnosis present

## 2021-07-01 DIAGNOSIS — E119 Type 2 diabetes mellitus without complications: Secondary | ICD-10-CM

## 2021-07-01 DIAGNOSIS — I2510 Atherosclerotic heart disease of native coronary artery without angina pectoris: Secondary | ICD-10-CM | POA: Diagnosis present

## 2021-07-01 DIAGNOSIS — K760 Fatty (change of) liver, not elsewhere classified: Secondary | ICD-10-CM | POA: Diagnosis not present

## 2021-07-01 LAB — URINALYSIS, ROUTINE W REFLEX MICROSCOPIC
Bilirubin Urine: NEGATIVE
Glucose, UA: 500 mg/dL — AB
Ketones, ur: 15 mg/dL — AB
Nitrite: NEGATIVE
Protein, ur: 30 mg/dL — AB
Specific Gravity, Urine: 1.022 (ref 1.005–1.030)
pH: 5 (ref 5.0–8.0)

## 2021-07-01 LAB — CBC
HCT: 41.3 % (ref 36.0–46.0)
Hemoglobin: 13.6 g/dL (ref 12.0–15.0)
MCH: 29.1 pg (ref 26.0–34.0)
MCHC: 32.9 g/dL (ref 30.0–36.0)
MCV: 88.2 fL (ref 80.0–100.0)
Platelets: 220 10*3/uL (ref 150–400)
RBC: 4.68 MIL/uL (ref 3.87–5.11)
RDW: 13.4 % (ref 11.5–15.5)
WBC: 13.4 10*3/uL — ABNORMAL HIGH (ref 4.0–10.5)
nRBC: 0 % (ref 0.0–0.2)

## 2021-07-01 LAB — RESP PANEL BY RT-PCR (FLU A&B, COVID) ARPGX2
Influenza A by PCR: NEGATIVE
Influenza B by PCR: NEGATIVE
SARS Coronavirus 2 by RT PCR: NEGATIVE

## 2021-07-01 LAB — BASIC METABOLIC PANEL
Anion gap: 12 (ref 5–15)
BUN: 16 mg/dL (ref 8–23)
CO2: 24 mmol/L (ref 22–32)
Calcium: 9.4 mg/dL (ref 8.9–10.3)
Chloride: 101 mmol/L (ref 98–111)
Creatinine, Ser: 1 mg/dL (ref 0.44–1.00)
GFR, Estimated: >60 mL/min (ref >60)
Glucose, Bld: 255 mg/dL — ABNORMAL HIGH (ref 70–99)
Potassium: 4.1 mmol/L (ref 3.5–5.1)
Sodium: 137 mmol/L (ref 135–145)

## 2021-07-01 MED ORDER — HYDROMORPHONE HCL 1 MG/ML IJ SOLN
1.0000 mg | Freq: Once | INTRAMUSCULAR | Status: AC
  Administered 2021-07-02: 1 mg via INTRAVENOUS
  Filled 2021-07-01: qty 1

## 2021-07-01 MED ORDER — IOHEXOL 300 MG/ML  SOLN
100.0000 mL | Freq: Once | INTRAMUSCULAR | Status: AC | PRN
  Administered 2021-07-01: 100 mL via INTRAVENOUS

## 2021-07-01 MED ORDER — SODIUM CHLORIDE 0.9 % IV SOLN
2.0000 g | Freq: Once | INTRAVENOUS | Status: AC
  Administered 2021-07-01: 2 g via INTRAVENOUS
  Filled 2021-07-01: qty 20

## 2021-07-01 MED ORDER — SODIUM CHLORIDE 0.9 % IV BOLUS
1000.0000 mL | Freq: Once | INTRAVENOUS | Status: AC
  Administered 2021-07-01: 1000 mL via INTRAVENOUS

## 2021-07-01 MED ORDER — KETOROLAC TROMETHAMINE 15 MG/ML IJ SOLN
15.0000 mg | Freq: Once | INTRAMUSCULAR | Status: AC
Start: 2021-07-01 — End: 2021-07-01
  Administered 2021-07-01: 15 mg via INTRAVENOUS
  Filled 2021-07-01: qty 1

## 2021-07-01 MED ORDER — ONDANSETRON 4 MG PO TBDP
4.0000 mg | ORAL_TABLET | Freq: Once | ORAL | Status: AC | PRN
  Administered 2021-07-01: 4 mg via ORAL
  Filled 2021-07-01: qty 1

## 2021-07-01 MED ORDER — TERBINAFINE HCL 250 MG PO TABS: ORAL_TABLET | ORAL | 0 refills | Status: DC

## 2021-07-01 MED ORDER — ONDANSETRON HCL 4 MG/2ML IJ SOLN
4.0000 mg | Freq: Once | INTRAMUSCULAR | Status: AC
  Administered 2021-07-02: 4 mg via INTRAVENOUS
  Filled 2021-07-01: qty 2

## 2021-07-01 NOTE — ED Provider Notes (Signed)
°  Provider Note MRN:  [id number]  Arrival date & time: 07/02/21    ED Course and Medical Decision Making  Assumed care from Dr. Pearline Cables at shift change.  Obstructive kidney stone, 9 mm with fever awaiting urology recommendations.  On reassessment patient seems ill, rigors, heart rate in the 140s.  Concern for sepsis due to infected kidney stone.  Case discussed with Dr. Milford Cage of urology, plan is for urgent/emergent intervention with a stent.  Accepted for transfer to [facility] emergency department, Dr. Laverta Baltimore is the accepting ED physician.  .Critical Care Performed by: Maudie Flakes, MD Authorized by: Maudie Flakes, MD   Critical care provider statement:    Critical care time (minutes):  35   Critical care was necessary to treat or prevent imminent or life-threatening deterioration of the following conditions:  Sepsis (Infected kidney stone)   Critical care was time spent personally by me on the following activities:  Development of treatment plan with patient or surrogate, discussions with consultants, evaluation of patient's response to treatment, examination of patient, ordering and review of laboratory studies, ordering and review of radiographic studies, ordering and performing treatments and interventions, pulse oximetry, re-evaluation of patient's condition and review of old charts  Final Clinical Impressions(s) / ED Diagnoses     ICD-10-CM   1. Kidney stone  N20.0     2. Sepsis, due to unspecified organism, unspecified whether acute organ dysfunction present [facility])  A41.9       ED Discharge Orders     None       Discharge Instructions   None     Barth Kirks. [place] Small, [place] [email or web address]    Maudie Flakes, MD 07/02/21 [phone]

## 2021-07-01 NOTE — Progress Notes (Signed)
Subjective:   Patient ID: Jaime Nichols, female   DOB: 61 y.o.   MRN: [id number]   HPI Patient presents with nail disease that we did cultures on indicating fungal infection.  She is interested in treatment for this   ROS      Objective:  Physical Exam  Neurovascular status intact with trauma and yellow discoloration third nail right and slightly into the fourth and into the second nail right foot with mild history of trauma     Assessment:  Mycotic fungal infection confirmed on biopsy     Plan:  Discussed condition recommended we can treat this with antifungal therapy and we will just do a pulse oral antifungal in conjunction with laser.  Explained to her all risk she wants to do this and is scheduled to have this done

## 2021-07-01 NOTE — ED Provider Notes (Addendum)
[place] EMERGENCY DEPT Provider Note   CSN: [id number] Arrival date & time: 07/01/21  2021     History  Chief Complaint  Patient presents with   Flank Pain    Jaime Nichols is a 61 y.o. female.  This is a 61 y.o. female with significant medical history as below, including recurrent kidney stones, gout, OSA on CPAP who presents to the ED with complaint of right-sided flank, right lower quadrant abdominal pain, fever, nausea and vomiting.  Onset of symptoms earlier today.  Patient with low-grade fever at home, right-sided flank pain.  Described as sharp, aching, stabbing.  Radiation to her right lower quadrant.  Nausea and vomiting.  Reduced oral intake over the past 24 hours secondary to nausea.  No recent travel or sick contacts.  No change urination however she does have incontinence at baseline.  No hematuria or dysuria.  No change in bowel function.  No rashes.  No recent falls.  No vaginal bleeding or discharge that she feels is abnormal.  No medications prior to arrival.  The history is provided by the patient and the spouse. No language interpreter was used.  Flank Pain Associated symptoms include abdominal pain. Pertinent negatives include no chest pain, no headaches and no shortness of breath.     Patient Active Problem List   Diagnosis Date Noted   Sepsis secondary to UTI ([place]) 07/02/2021   Kidney stone 07/02/2021   Prolonged QT interval 07/02/2021   Coronary artery calcification seen on CAT scan    PAT (paroxysmal atrial tachycardia) ([place]) 05/24/2018   OSA on CPAP    Chronic diastolic CHF (congestive heart failure) ([place])    Diabetes type 2, controlled ([place]) 07/14/2015   HTN (hypertension) 04/22/2014   Morbid obesity ([place]) 12/10/2013   Hyperlipidemia    PECTUS EXCAVATUM 01/31/2008   DYSPNEA 01/31/2008     Home Medications Prior to Admission medications   Medication Sig Start Date End Date Taking? Authorizing Provider  PRESCRIPTION  MEDICATION CPAP- At bedtime   Yes [provider]  allopurinol (ZYLOPRIM) 100 MG tablet Take 100 mg by mouth daily.    [provider]  celecoxib (CELEBREX) 200 MG capsule Take 200 mg by mouth 2 (two) times daily. 06/16/21   [provider]  cholecalciferol (VITAMIN D3) 25 MCG (1000 UNIT) tablet Take 1,000 Units by mouth daily.    [provider]  colchicine 0.6 MG tablet Take 0.6 mg by mouth every Monday, Wednesday, and Friday.    [provider]  dapagliflozin propanediol (FARXIGA) 5 MG TABS tablet Take by mouth daily.    [provider]  diltiazem (CARDIZEM CD) 120 MG 24 hr capsule TAKE 1 CAPSULE BY MOUTH EVERY DAY Patient taking differently: Take 120 mg by mouth daily. 05/04/21   Sueanne Margarita, MD  Dulaglutide (TRULICITY) 3 SJ/6.2EZ SOPN Inject 3 mg into the skin once a week. 05/10/21   [provider]  estradiol (ESTRACE) 0.1 MG/GM vaginal cream Place 1 Applicatorful vaginally every 14 (fourteen) days. 11/24/20   [provider]  Evolocumab (REPATHA SURECLICK) 662 MG/ML SOAJ Inject 1 pen into the skin every 14 (fourteen) days. 05/04/21   Sueanne Margarita, MD  levalbuterol [facility] HFA) 45 MCG/ACT inhaler Inhale 1 puff into the lungs every 4 (four) hours as needed for shortness of breath. 09/27/18   [provider]  lisinopril (ZESTRIL) 10 MG tablet Take 1 tablet (10 mg total) by mouth daily. 05/04/21   Sueanne Margarita, MD  metFORMIN (GLUCOPHAGE-XR) 500 MG 24 hr tablet TAKE 2 TABLETS BY MOUTH TWICE A DAY WITH MEALS Patient taking differently: Take 1,000 mg by mouth 2 (two) times daily with a meal. 09/25/17   Renato Shin, MD  metoprolol succinate (TOPROL-XL) 25 MG 24 hr tablet Take 1 tablet (25 mg total) by mouth daily. 05/04/21   Sueanne Margarita, MD  Potassium Citrate 15 MEQ (1620 MG) TBCR Take 15 mEq by mouth daily. 06/04/21   [provider]  rosuvastatin (CRESTOR) 5 MG tablet Take one tablet by mouth daily  or as tolerated Patient taking differently: Take 5 mg by mouth daily. 09/26/19   Sueanne Margarita, MD  terbinafine (LAMISIL) 250 MG tablet Please take one a day x 7days, repeat every 4 weeks x 4 months Patient taking differently: Take 250 mg by mouth See admin instructions. 250mg  daily for 7 days then stop. Repeat every 4 weeks for 4 months 07/01/21   Wallene Huh, DPM      Allergies    Erythromycin, Latex, Nickel, Statins, Crestor [rosuvastatin], Metformin hcl, and Zocor [simvastatin]    Review of Systems   Review of Systems  Constitutional:  Positive for appetite change and fever. Negative for chills.  HENT:  Negative for facial swelling and trouble swallowing.   Eyes:  Negative for photophobia and visual disturbance.  Respiratory:  Negative for cough and shortness of breath.   Cardiovascular:  Negative for chest pain and palpitations.  Gastrointestinal:  Positive for abdominal pain, nausea and vomiting.  Endocrine: Negative for polydipsia and polyuria.  Genitourinary:  Positive for flank pain. Negative for difficulty urinating and hematuria.  Musculoskeletal:  Negative for gait problem and joint swelling.  Skin:  Negative for pallor and rash.  Neurological:  Negative for syncope and headaches.  Psychiatric/Behavioral:  Negative for agitation and confusion.    Physical Exam Updated Vital Signs BP 133/74 (BP Location: Right Arm)    Pulse 100    Temp 99.3 F (37.4 C) (Oral)    Resp 18    Ht 5\' 3"  (1.6 m)    Wt 118.2 kg    SpO2 97%    BMI 46.16 kg/m  Physical Exam Vitals and nursing note reviewed.  Constitutional:      General: She is not in acute distress.    Appearance: Normal appearance. She is obese. She is not ill-appearing, toxic-appearing or diaphoretic.  HENT:     Head: Normocephalic and atraumatic.     Right Ear: External ear normal.     Left Ear: External ear normal.     Nose: Nose normal.     Mouth/Throat:     Mouth: Mucous membranes are moist.  Eyes:     General:  No scleral icterus.       Right eye: No discharge.        Left eye: No discharge.  Cardiovascular:     Rate and Rhythm: Normal rate and regular rhythm.     Pulses: Normal pulses.     Heart sounds: Normal heart sounds.  Pulmonary:     Effort: Pulmonary effort is normal. No respiratory distress.     Breath sounds: Normal breath sounds.  Abdominal:     General: Abdomen is flat.     Tenderness: There is abdominal tenderness.       Comments: Nonperitoneal, no rebound  Musculoskeletal:        General: Normal range of motion.     Cervical back: Normal range of motion.  Right lower leg: No edema.     Left lower leg: No edema.  Skin:    General: Skin is warm and dry.     Capillary Refill: Capillary refill takes less than 2 seconds.  Neurological:     Mental Status: She is alert.  Psychiatric:        Mood and Affect: Mood normal.        Behavior: Behavior normal.    ED Results / Procedures / Treatments   Labs (all labs ordered are listed, but only abnormal results are displayed) Labs Reviewed  URINALYSIS, ROUTINE W REFLEX MICROSCOPIC - Abnormal; Notable for the following components:      Result Value   APPearance HAZY (*)    Glucose, UA 500 (*)    Hgb urine dipstick MODERATE (*)    Ketones, ur 15 (*)    Protein, ur 30 (*)    Leukocytes,Ua MODERATE (*)    Bacteria, UA RARE (*)    All other components within normal limits  BASIC METABOLIC PANEL - Abnormal; Notable for the following components:   Glucose, Bld 255 (*)    All other components within normal limits  CBC - Abnormal; Notable for the following components:   WBC 13.4 (*)    All other components within normal limits  LACTIC ACID, PLASMA - Abnormal; Notable for the following components:   Lactic Acid, Venous 2.7 (*)    All other components within normal limits  LACTIC ACID, PLASMA - Abnormal; Notable for the following components:   Lactic Acid, Venous 2.6 (*)    All other components within normal limits  GLUCOSE,  CAPILLARY - Abnormal; Notable for the following components:   Glucose-Capillary 290 (*)    All other components within normal limits  GLUCOSE, CAPILLARY - Abnormal; Notable for the following components:   Glucose-Capillary 324 (*)    All other components within normal limits  HEMOGLOBIN A1C - Abnormal; Notable for the following components:   Hgb A1c MFr Bld 8.0 (*)    All other components within normal limits  LACTIC ACID, PLASMA - Abnormal; Notable for the following components:   Lactic Acid, Venous 2.2 (*)    All other components within normal limits  LACTIC ACID, PLASMA - Abnormal; Notable for the following components:   Lactic Acid, Venous 2.4 (*)    All other components within normal limits  COMPREHENSIVE METABOLIC PANEL - Abnormal; Notable for the following components:   Sodium 132 (*)    CO2 21 (*)    Glucose, Bld 330 (*)    Calcium 8.1 (*)    ALT 50 (*)    All other components within normal limits  CBC - Abnormal; Notable for the following components:   WBC 19.9 (*)    All other components within normal limits  GLUCOSE, CAPILLARY - Abnormal; Notable for the following components:   Glucose-Capillary 306 (*)    All other components within normal limits  GLUCOSE, CAPILLARY - Abnormal; Notable for the following components:   Glucose-Capillary 294 (*)    All other components within normal limits  GLUCOSE, CAPILLARY - Abnormal; Notable for the following components:   Glucose-Capillary 275 (*)    All other components within normal limits  GLUCOSE, CAPILLARY - Abnormal; Notable for the following components:   Glucose-Capillary 286 (*)    All other components within normal limits  GLUCOSE, CAPILLARY - Abnormal; Notable for the following components:   Glucose-Capillary 298 (*)    All other components within normal limits  RESP PANEL BY RT-PCR (FLU A&B, COVID) ARPGX2  URINE CULTURE  HIV ANTIBODY (ROUTINE TESTING W REFLEX)    EKG None  Radiology CT ABDOMEN PELVIS W  CONTRAST  Result Date: 07/01/2021 CLINICAL DATA:  Abdomen pain EXAM: CT ABDOMEN AND PELVIS WITH CONTRAST TECHNIQUE: Multidetector CT imaging of the abdomen and pelvis was performed using the standard protocol following bolus administration of intravenous contrast. CONTRAST:  122mL OMNIPAQUE IOHEXOL 300 MG/ML  SOLN COMPARISON:  CT 12/14/2020 FINDINGS: Lower chest: Lung bases demonstrate no acute consolidation or effusion. Mild cardiomegaly with coronary vascular calcification. Hepatobiliary: Hepatic steatosis. Status post cholecystectomy. No biliary dilatation Pancreas: Unremarkable. No pancreatic ductal dilatation or surrounding inflammatory changes. Spleen: Splenic granuloma Adrenals/Urinary Tract: Adrenal glands are normal. 5 mm stone within the mid left kidney. 13 mm cyst mid right kidney. Multiple right intrarenal stones. 9 mm right renal pelvis stone, at the UPJ, now with mild hydronephrosis and perinephric fat stranding. The urinary bladder is unremarkable. Delayed excretion of contrast on the right consistent with obstruction. Stomach/Bowel: The stomach is nonenlarged. No dilated small bowel. Negative appendix. No acute bowel wall thickening Vascular/Lymphatic: Mild aortic atherosclerosis. No aneurysm. No suspicious lymph node Reproductive: Status post hysterectomy. No adnexal masses. Other: Negative for pelvic effusion or free air. Diastasis of the rectus with small fat containing periumbilical hernia Musculoskeletal: No acute or significant osseous findings. IMPRESSION: 1. 9 mm right renal pelvis stone with new mild right hydronephrosis and perinephric fat stranding. Stone is at the right UPJ. There are multiple right greater than left intrarenal stones. 2. Hepatic steatosis. Electronically Signed   By: Donavan Foil M.D.   On: 07/01/2021 22:49   DG C-Arm 1-60 Min-No Report  Result Date: 07/02/2021 Fluoroscopy was utilized by the requesting physician.  No radiographic interpretation.     Procedures .Critical Care Performed by: Jeanell Sparrow, DO Authorized by: Jeanell Sparrow, DO   Critical care provider statement:    Critical care time (minutes):  30   Critical care time was exclusive of:  Separately billable procedures and treating other patients   Critical care was necessary to treat or prevent imminent or life-threatening deterioration of the following conditions:  Sepsis   Critical care was time spent personally by me on the following activities:  Development of treatment plan with patient or surrogate, discussions with consultants, evaluation of patient's response to treatment, examination of patient, ordering and review of laboratory studies, ordering and review of radiographic studies, ordering and performing treatments and interventions, pulse oximetry, re-evaluation of patient's condition and review of old charts    Medications Ordered in ED Medications  insulin aspart (novoLOG) 100 UNIT/ML injection (has no administration in time range)  allopurinol (ZYLOPRIM) tablet 100 mg (has no administration in time range)  lisinopril (ZESTRIL) tablet 10 mg (has no administration in time range)  metoprolol succinate (TOPROL-XL) 24 hr tablet 25 mg (has no administration in time range)  insulin aspart (novoLOG) injection 0-15 Units (8 Units Subcutaneous Given 07/02/21 1153)  enoxaparin (LOVENOX) injection 40 mg (has no administration in time range)  lactated ringers infusion ( Intravenous New Bag/Given 07/02/21 1340)  acetaminophen (TYLENOL) tablet 650 mg (has no administration in time range)    Or  acetaminophen (TYLENOL) suppository 650 mg (has no administration in time range)  trimethobenzamide (TIGAN) injection 200 mg (has no administration in time range)  cefTRIAXone (ROCEPHIN) 2 g in sodium chloride 0.9 % 100 mL IVPB (has no administration in time range)  insulin aspart (novoLOG) 100 UNIT/ML injection (  has no administration in time range)  insulin aspart (novoLOG) 100  UNIT/ML injection (  Not Given 07/02/21 1337)  HYDROcodone-acetaminophen (NORCO/VICODIN) 5-325 MG per tablet 1-2 tablet (1 tablet Oral Given 07/02/21 1357)  HYDROmorphone (DILAUDID) injection 0.5 mg (has no administration in time range)  Chlorhexidine Gluconate Cloth 2 % PADS 6 each (has no administration in time range)  ondansetron (ZOFRAN-ODT) disintegrating tablet 4 mg (4 mg Oral Given 07/01/21 2059)  sodium chloride 0.9 % bolus 1,000 mL (0 mLs Intravenous Stopped 07/01/21 2350)  cefTRIAXone (ROCEPHIN) 2 g in sodium chloride 0.9 % 100 mL IVPB (0 g Intravenous Stopped 07/01/21 2257)  iohexol (OMNIPAQUE) 300 MG/ML solution 100 mL (100 mLs Intravenous Contrast Given 07/01/21 2232)  ketorolac (TORADOL) 15 MG/ML injection 15 mg (15 mg Intravenous Given 07/01/21 2309)  HYDROmorphone (DILAUDID) injection 1 mg (1 mg Intravenous Given 07/02/21 0011)  ondansetron (ZOFRAN) injection 4 mg (4 mg Intravenous Given 07/02/21 0009)  sodium chloride 0.9 % bolus 500 mL (0 mLs Intravenous Stopped 07/02/21 0131)  insulin aspart (novoLOG) injection 10 Units (10 Units Subcutaneous Given 07/02/21 0250)  insulin aspart (novoLOG) injection 8 Units (8 Units Subcutaneous Given 07/02/21 0355)  insulin aspart (novoLOG) 100 UNIT/ML injection (11 Units Subcutaneous Given 07/02/21 0525)  insulin aspart (novoLOG) 100 UNIT/ML injection (8 Units Subcutaneous Given 07/02/21 0865)    ED Course/ Medical Decision Making/ A&P                           Medical Decision Making   CC: Flank pain, nausea and vomiting  This patient complains of above; this involves an extensive number of treatment options and is a complaint that carries with it a high risk of complications and morbidity. Vital signs were reviewed. Serious etiologies considered.  Record review:  Previous records obtained and reviewed   Additional history obtained from spouse  Work up as above, notable for:  Labs & imaging results that were available during my care of the  patient were reviewed by me and considered in my medical decision making.   I ordered imaging studies which included ct a/p with iv contrast and I independently visualized and interpreted imaging which showed 66mm stone at UPJ on the right, hydronephrosis. UA concerning for infection, she has n/v, fever tachy. Concern for pyelo vs septic stone, will d/w urology regarding CT  Management: Ivf, toradol, rocephin, zofran  Reassessment:  Patient given IV antibiotics for infection.  Report that she is feeling better, nausea has improved.  Afebrile at this time.  Mildly tachycardic continuing.  Pt hemodynamically stable, nausea and pain well controlled.   Care at this time signed out to incoming physician pending discussion with urology and likely admission for urosepsis/septic stone versus pyelonephritis.  Updated on care plan and is agreeable with plan.         This chart was dictated using voice recognition software.  Despite best efforts to proofread,  errors can occur which can change the documentation meaning.         Final Clinical Impression(s) / ED Diagnoses Final diagnoses:  Kidney stone  Sepsis, due to unspecified organism, unspecified whether acute organ dysfunction present [facility])    Rx / DC Orders ED Discharge Orders     None         Jeanell Sparrow, DO 07/02/21 1552    Jeanell Sparrow, DO 07/02/21 1552

## 2021-07-01 NOTE — ED Triage Notes (Signed)
Patient here POV from Home with   Patient states she has a History of Renal Stones that are closely tracked by Urologist.   Patient began to have Lower Back Pain this AM. Recommended to take normal Medications but Pain has increased since and seeks ED Evaluation.  NAD Noted during Triage. A&Ox4. GCS 15. BIB Wheelchair.

## 2021-07-02 ENCOUNTER — Inpatient Hospital Stay (HOSPITAL_COMMUNITY): Payer: BC Managed Care – PPO | Admitting: Certified Registered"

## 2021-07-02 ENCOUNTER — Encounter (HOSPITAL_COMMUNITY)
Admission: EM | Disposition: A | Discharge status: Home / Self Care | Payer: Self-pay | Source: Home / Self Care | Attending: Internal Medicine

## 2021-07-02 ENCOUNTER — Encounter (HOSPITAL_COMMUNITY): Payer: Self-pay | Admitting: Internal Medicine

## 2021-07-02 ENCOUNTER — Inpatient Hospital Stay (HOSPITAL_COMMUNITY): Payer: BC Managed Care – PPO

## 2021-07-02 DIAGNOSIS — Z881 Allergy status to other antibiotic agents status: Secondary | ICD-10-CM | POA: Diagnosis not present

## 2021-07-02 DIAGNOSIS — Z9049 Acquired absence of other specified parts of digestive tract: Secondary | ICD-10-CM | POA: Diagnosis not present

## 2021-07-02 DIAGNOSIS — Z833 Family history of diabetes mellitus: Secondary | ICD-10-CM | POA: Diagnosis not present

## 2021-07-02 DIAGNOSIS — Z7984 Long term (current) use of oral hypoglycemic drugs: Secondary | ICD-10-CM | POA: Diagnosis not present

## 2021-07-02 DIAGNOSIS — Z20822 Contact with and (suspected) exposure to covid-19: Secondary | ICD-10-CM | POA: Diagnosis present

## 2021-07-02 DIAGNOSIS — I700 Atherosclerosis of aorta: Secondary | ICD-10-CM | POA: Diagnosis present

## 2021-07-02 DIAGNOSIS — M109 Gout, unspecified: Secondary | ICD-10-CM | POA: Diagnosis present

## 2021-07-02 DIAGNOSIS — Z888 Allergy status to other drugs, medicaments and biological substances status: Secondary | ICD-10-CM | POA: Diagnosis not present

## 2021-07-02 DIAGNOSIS — I2510 Atherosclerotic heart disease of native coronary artery without angina pectoris: Secondary | ICD-10-CM | POA: Diagnosis not present

## 2021-07-02 DIAGNOSIS — Z90710 Acquired absence of both cervix and uterus: Secondary | ICD-10-CM | POA: Diagnosis not present

## 2021-07-02 DIAGNOSIS — I471 Supraventricular tachycardia: Secondary | ICD-10-CM | POA: Diagnosis present

## 2021-07-02 DIAGNOSIS — N200 Calculus of kidney: Secondary | ICD-10-CM | POA: Diagnosis present

## 2021-07-02 DIAGNOSIS — N390 Urinary tract infection, site not specified: Secondary | ICD-10-CM | POA: Diagnosis not present

## 2021-07-02 DIAGNOSIS — Z6841 Body Mass Index (BMI) 40.0 and over, adult: Secondary | ICD-10-CM | POA: Diagnosis not present

## 2021-07-02 DIAGNOSIS — N201 Calculus of ureter: Secondary | ICD-10-CM | POA: Diagnosis not present

## 2021-07-02 DIAGNOSIS — G4733 Obstructive sleep apnea (adult) (pediatric): Secondary | ICD-10-CM | POA: Diagnosis present

## 2021-07-02 DIAGNOSIS — N136 Pyonephrosis: Secondary | ICD-10-CM | POA: Diagnosis present

## 2021-07-02 DIAGNOSIS — Z818 Family history of other mental and behavioral disorders: Secondary | ICD-10-CM | POA: Diagnosis not present

## 2021-07-02 DIAGNOSIS — Z8249 Family history of ischemic heart disease and other diseases of the circulatory system: Secondary | ICD-10-CM | POA: Diagnosis not present

## 2021-07-02 DIAGNOSIS — I110 Hypertensive heart disease with heart failure: Secondary | ICD-10-CM | POA: Diagnosis not present

## 2021-07-02 DIAGNOSIS — Z79899 Other long term (current) drug therapy: Secondary | ICD-10-CM | POA: Diagnosis not present

## 2021-07-02 DIAGNOSIS — Z91040 Latex allergy status: Secondary | ICD-10-CM | POA: Diagnosis not present

## 2021-07-02 DIAGNOSIS — I5032 Chronic diastolic (congestive) heart failure: Secondary | ICD-10-CM | POA: Diagnosis present

## 2021-07-02 DIAGNOSIS — R9431 Abnormal electrocardiogram [ECG] [EKG]: Secondary | ICD-10-CM

## 2021-07-02 DIAGNOSIS — E1165 Type 2 diabetes mellitus with hyperglycemia: Secondary | ICD-10-CM | POA: Diagnosis present

## 2021-07-02 DIAGNOSIS — E785 Hyperlipidemia, unspecified: Secondary | ICD-10-CM | POA: Diagnosis present

## 2021-07-02 DIAGNOSIS — A419 Sepsis, unspecified organism: Principal | ICD-10-CM

## 2021-07-02 HISTORY — PX: CYSTOSCOPY W/ URETERAL STENT PLACEMENT: SHX1429

## 2021-07-02 LAB — LACTIC ACID, PLASMA
Lactic Acid, Venous: 2.7 mmol/L (ref 0.5–1.9)
Lactic Acid, Venous: 2.2 mmol/L (ref 0.5–1.9)
Lactic Acid, Venous: 2.4 mmol/L (ref 0.5–1.9)

## 2021-07-02 LAB — COMPREHENSIVE METABOLIC PANEL
ALT: 50 U/L — ABNORMAL HIGH (ref 0–44)
AST: 32 U/L (ref 15–41)
Albumin: 3.7 g/dL (ref 3.5–5.0)
Alkaline Phosphatase: 74 U/L (ref 38–126)
Anion gap: 7 (ref 5–15)
BUN: 16 mg/dL (ref 8–23)
CO2: 21 mmol/L — ABNORMAL LOW (ref 22–32)
Calcium: 8.1 mg/dL — ABNORMAL LOW (ref 8.9–10.3)
Chloride: 104 mmol/L (ref 98–111)
Creatinine, Ser: 0.79 mg/dL (ref 0.44–1.00)
GFR, Estimated: >60 mL/min (ref >60)
Glucose, Bld: 330 mg/dL — ABNORMAL HIGH (ref 70–99)
Potassium: 4 mmol/L (ref 3.5–5.1)
Sodium: 132 mmol/L — ABNORMAL LOW (ref 135–145)
Total Bilirubin: 0.8 mg/dL (ref 0.3–1.2)
Total Protein: 6.5 g/dL (ref 6.5–8.1)

## 2021-07-02 LAB — HEMOGLOBIN A1C
Hgb A1c MFr Bld: 8 % — ABNORMAL HIGH (ref 4.8–5.6)
Mean Plasma Glucose: 182.9 mg/dL

## 2021-07-02 LAB — GLUCOSE, CAPILLARY
Glucose-Capillary: 275 mg/dL — ABNORMAL HIGH (ref 70–99)
Glucose-Capillary: 290 mg/dL — ABNORMAL HIGH (ref 70–99)
Glucose-Capillary: 324 mg/dL — ABNORMAL HIGH (ref 70–99)
Glucose-Capillary: 306 mg/dL — ABNORMAL HIGH (ref 70–99)
Glucose-Capillary: 294 mg/dL — ABNORMAL HIGH (ref 70–99)
Glucose-Capillary: 286 mg/dL — ABNORMAL HIGH (ref 70–99)
Glucose-Capillary: 298 mg/dL — ABNORMAL HIGH (ref 70–99)
Glucose-Capillary: 317 mg/dL — ABNORMAL HIGH (ref 70–99)
Glucose-Capillary: 213 mg/dL — ABNORMAL HIGH (ref 70–99)

## 2021-07-02 LAB — HIV ANTIBODY (ROUTINE TESTING W REFLEX): HIV Screen 4th Generation wRfx: NONREACTIVE

## 2021-07-02 LAB — CBC
HCT: 39.1 % (ref 36.0–46.0)
Hemoglobin: 12.6 g/dL (ref 12.0–15.0)
MCH: 29.9 pg (ref 26.0–34.0)
MCHC: 32.2 g/dL (ref 30.0–36.0)
MCV: 92.7 fL (ref 80.0–100.0)
Platelets: 187 10*3/uL (ref 150–400)
RBC: 4.22 MIL/uL (ref 3.87–5.11)
RDW: 13.5 % (ref 11.5–15.5)
WBC: 19.9 10*3/uL — ABNORMAL HIGH (ref 4.0–10.5)
nRBC: 0 % (ref 0.0–0.2)

## 2021-07-02 SURGERY — CYSTOSCOPY, WITH RETROGRADE PYELOGRAM AND URETERAL STENT INSERTION
Anesthesia: General | Site: Ureter | Laterality: Right

## 2021-07-02 MED ORDER — INSULIN ASPART 100 UNIT/ML IJ SOLN
10.0000 [IU] | Freq: Once | INTRAMUSCULAR | Status: AC
  Administered 2021-07-02: 10 [IU] via SUBCUTANEOUS

## 2021-07-02 MED ORDER — FENTANYL CITRATE (PF) 100 MCG/2ML IJ SOLN
INTRAMUSCULAR | Status: DC | PRN
  Administered 2021-07-02: 50 ug via INTRAVENOUS

## 2021-07-02 MED ORDER — INSULIN ASPART 100 UNIT/ML IJ SOLN
INTRAMUSCULAR | Status: AC
  Administered 2021-07-02: 11 [IU] via SUBCUTANEOUS
  Filled 2021-07-02: qty 1

## 2021-07-02 MED ORDER — AMISULPRIDE (ANTIEMETIC) 5 MG/2ML IV SOLN: 10.0000 mg | Freq: Once | INTRAVENOUS | Status: DC | PRN

## 2021-07-02 MED ORDER — DEXAMETHASONE SODIUM PHOSPHATE 10 MG/ML IJ SOLN
INTRAMUSCULAR | Status: DC | PRN
  Administered 2021-07-02: 4 mg via INTRAVENOUS

## 2021-07-02 MED ORDER — ONDANSETRON HCL 4 MG/2ML IJ SOLN
INTRAMUSCULAR | Status: DC | PRN
Start: 2021-07-02 — End: 2021-07-02
  Administered 2021-07-02: 4 mg via INTRAVENOUS

## 2021-07-02 MED ORDER — INSULIN ASPART 100 UNIT/ML IJ SOLN
INTRAMUSCULAR | Status: AC
  Filled 2021-07-02: qty 1

## 2021-07-02 MED ORDER — OXYCODONE HCL 5 MG/5ML PO SOLN: 5.0000 mg | Freq: Once | ORAL | Status: DC | PRN

## 2021-07-02 MED ORDER — PROPOFOL 10 MG/ML IV BOLUS
INTRAVENOUS | Status: DC | PRN
Start: 2021-07-02 — End: 2021-07-02
  Administered 2021-07-02: 130 mg via INTRAVENOUS

## 2021-07-02 MED ORDER — ACETAMINOPHEN 10 MG/ML IV SOLN
INTRAVENOUS | Status: AC
  Filled 2021-07-02: qty 100

## 2021-07-02 MED ORDER — INSULIN ASPART 100 UNIT/ML IJ SOLN
0.0000 [IU] | Freq: Three times a day (TID) | INTRAMUSCULAR | Status: DC
  Administered 2021-07-02: 11 [IU] via SUBCUTANEOUS
  Administered 2021-07-02 (×2): 8 [IU] via SUBCUTANEOUS
  Administered 2021-07-02: 5 [IU] via SUBCUTANEOUS
  Administered 2021-07-03: 3 [IU] via SUBCUTANEOUS

## 2021-07-02 MED ORDER — SODIUM CHLORIDE 0.9 % IV SOLN
2.0000 g | INTRAVENOUS | Status: DC
  Administered 2021-07-02: 2 g via INTRAVENOUS
  Filled 2021-07-02 (×2): qty 20

## 2021-07-02 MED ORDER — ACETAMINOPHEN 160 MG/5ML PO SOLN: 325.0000 mg | ORAL | Status: DC | PRN

## 2021-07-02 MED ORDER — MIDAZOLAM HCL 2 MG/2ML IJ SOLN
INTRAMUSCULAR | Status: DC | PRN
  Administered 2021-07-02: 1 mg via INTRAVENOUS

## 2021-07-02 MED ORDER — SODIUM CHLORIDE 0.9 % IR SOLN
Status: DC | PRN
  Administered 2021-07-02: 1000 mL

## 2021-07-02 MED ORDER — METOPROLOL SUCCINATE ER 25 MG PO TB24
25.0000 mg | ORAL_TABLET | Freq: Every day | ORAL | Status: DC
  Administered 2021-07-02 – 2021-07-03 (×2): 25 mg via ORAL
  Filled 2021-07-02 (×2): qty 1

## 2021-07-02 MED ORDER — ACETAMINOPHEN 10 MG/ML IV SOLN: 1000.0000 mg | Freq: Once | INTRAVENOUS | Status: DC | PRN

## 2021-07-02 MED ORDER — OXYBUTYNIN CHLORIDE 5 MG PO TABS
2.5000 mg | ORAL_TABLET | Freq: Three times a day (TID) | ORAL | Status: DC | PRN
  Administered 2021-07-02: 2.5 mg via ORAL
  Filled 2021-07-02: qty 1

## 2021-07-02 MED ORDER — ENOXAPARIN SODIUM 40 MG/0.4ML IJ SOSY
40.0000 mg | PREFILLED_SYRINGE | INTRAMUSCULAR | Status: DC
  Administered 2021-07-02: 40 mg via SUBCUTANEOUS
  Filled 2021-07-02: qty 0.4

## 2021-07-02 MED ORDER — ALLOPURINOL 100 MG PO TABS
100.0000 mg | ORAL_TABLET | Freq: Every day | ORAL | Status: DC
  Administered 2021-07-02 – 2021-07-03 (×2): 100 mg via ORAL
  Filled 2021-07-02 (×2): qty 1

## 2021-07-02 MED ORDER — ACETAMINOPHEN 325 MG PO TABS: 325.0000 mg | ORAL_TABLET | ORAL | Status: DC | PRN

## 2021-07-02 MED ORDER — PROMETHAZINE HCL 25 MG/ML IJ SOLN: 6.2500 mg | INTRAMUSCULAR | Status: DC | PRN

## 2021-07-02 MED ORDER — TRIMETHOBENZAMIDE HCL 100 MG/ML IM SOLN
200.0000 mg | Freq: Three times a day (TID) | INTRAMUSCULAR | Status: DC | PRN
  Filled 2021-07-02: qty 2

## 2021-07-02 MED ORDER — OXYCODONE HCL 5 MG PO TABS: 5.0000 mg | ORAL_TABLET | Freq: Once | ORAL | Status: DC | PRN

## 2021-07-02 MED ORDER — CHLORHEXIDINE GLUCONATE CLOTH 2 % EX PADS
6.0000 | MEDICATED_PAD | Freq: Every day | CUTANEOUS | Status: DC
  Administered 2021-07-03: 6 via TOPICAL

## 2021-07-02 MED ORDER — SCOPOLAMINE 1 MG/3DAYS TD PT72
MEDICATED_PATCH | TRANSDERMAL | Status: DC | PRN
  Administered 2021-07-02: 1 via TRANSDERMAL

## 2021-07-02 MED ORDER — LIDOCAINE 2% (20 MG/ML) 5 ML SYRINGE
INTRAMUSCULAR | Status: DC | PRN
  Administered 2021-07-02: 40 mg via INTRAVENOUS

## 2021-07-02 MED ORDER — SUCCINYLCHOLINE CHLORIDE 200 MG/10ML IV SOSY
PREFILLED_SYRINGE | INTRAVENOUS | Status: DC | PRN
  Administered 2021-07-02: 120 mg via INTRAVENOUS

## 2021-07-02 MED ORDER — INSULIN ASPART 100 UNIT/ML IJ SOLN
INTRAMUSCULAR | Status: AC
  Administered 2021-07-02: 8 [IU] via SUBCUTANEOUS
  Filled 2021-07-02: qty 1

## 2021-07-02 MED ORDER — LACTATED RINGERS IV SOLN: INTRAVENOUS | Status: DC

## 2021-07-02 MED ORDER — SODIUM CHLORIDE 0.9 % IV BOLUS
500.0000 mL | Freq: Once | INTRAVENOUS | Status: AC
  Administered 2021-07-02: 500 mL via INTRAVENOUS

## 2021-07-02 MED ORDER — FENTANYL CITRATE PF 50 MCG/ML IJ SOSY: 25.0000 ug | PREFILLED_SYRINGE | INTRAMUSCULAR | Status: DC | PRN

## 2021-07-02 MED ORDER — HYDROMORPHONE HCL 1 MG/ML IJ SOLN: 1.0000 mg | INTRAMUSCULAR | Status: DC | PRN

## 2021-07-02 MED ORDER — HYDROMORPHONE HCL 1 MG/ML IJ SOLN: 0.5000 mg | INTRAMUSCULAR | Status: DC | PRN

## 2021-07-02 MED ORDER — IOHEXOL 300 MG/ML  SOLN
INTRAMUSCULAR | Status: DC | PRN
  Administered 2021-07-02: 5 mL via URETHRAL

## 2021-07-02 MED ORDER — HYDROCODONE-ACETAMINOPHEN 5-325 MG PO TABS
1.0000 | ORAL_TABLET | Freq: Four times a day (QID) | ORAL | Status: DC | PRN
  Administered 2021-07-02 (×2): 1 via ORAL
  Filled 2021-07-02: qty 2
  Filled 2021-07-02: qty 1

## 2021-07-02 MED ORDER — LISINOPRIL 10 MG PO TABS
10.0000 mg | ORAL_TABLET | Freq: Every day | ORAL | Status: DC
Start: 2021-07-02 — End: 2021-07-03
  Administered 2021-07-02 – 2021-07-03 (×2): 10 mg via ORAL
  Filled 2021-07-02 (×2): qty 1

## 2021-07-02 MED ORDER — INSULIN ASPART 100 UNIT/ML IJ SOLN
8.0000 [IU] | Freq: Once | INTRAMUSCULAR | Status: AC
  Administered 2021-07-02: 8 [IU] via SUBCUTANEOUS

## 2021-07-02 MED ORDER — ACETAMINOPHEN 650 MG RE SUPP: 650.0000 mg | Freq: Four times a day (QID) | RECTAL | Status: DC | PRN

## 2021-07-02 MED ORDER — SODIUM CHLORIDE 0.9 % IV SOLN: INTRAVENOUS | Status: DC | PRN

## 2021-07-02 MED ORDER — SODIUM CHLORIDE 0.9 % IR SOLN
Status: DC | PRN
  Administered 2021-07-02: 3000 mL

## 2021-07-02 MED ORDER — ACETAMINOPHEN 10 MG/ML IV SOLN
INTRAVENOUS | Status: DC | PRN
Start: 2021-07-02 — End: 2021-07-02
  Administered 2021-07-02: 1000 mg via INTRAVENOUS

## 2021-07-02 MED ORDER — ACETAMINOPHEN 325 MG PO TABS: 650.0000 mg | ORAL_TABLET | Freq: Four times a day (QID) | ORAL | Status: DC | PRN

## 2021-07-02 SURGICAL SUPPLY — 18 items
BAG URO CATCHER STRL LF (MISCELLANEOUS) ×3 IMPLANT
BULB IRRIG PATHFIND (MISCELLANEOUS) ×3 IMPLANT
CATH URETL OPEN 5X70 (CATHETERS) ×2 IMPLANT
CLOTH BEACON ORANGE TIMEOUT ST (SAFETY) ×3 IMPLANT
GLOVE SURG ENC MOIS LTX SZ8 (GLOVE) ×4 IMPLANT
GLOVE SURG ENC TEXT LTX SZ7.5 (GLOVE) ×3 IMPLANT
GLOVE SURG LTX SZ8 (GLOVE) ×2 IMPLANT
GOWN STRL REUS W/TWL XL LVL3 (GOWN DISPOSABLE) ×5 IMPLANT
GUIDEWIRE STR DUAL SENSOR (WIRE) ×3 IMPLANT
KIT TURNOVER KIT A (KITS) ×2 IMPLANT
MANIFOLD NEPTUNE II (INSTRUMENTS) ×3 IMPLANT
PACK CYSTO (CUSTOM PROCEDURE TRAY) ×3 IMPLANT
STENT URET 6FRX24 CONTOUR (STENTS) ×2 IMPLANT
SYR 20ML LL LF (SYRINGE) ×3 IMPLANT
TRAY FOL W/BAG SLVR 16FR STRL (SET/KITS/TRAYS/PACK) ×1 IMPLANT
TRAY FOLEY W/BAG SLVR 16FR LF (SET/KITS/TRAYS/PACK) ×3
TUBING CONNECTING 10 (TUBING) ×3 IMPLANT
TUBING UROLOGY SET (TUBING) ×3 IMPLANT

## 2021-07-02 NOTE — Progress Notes (Signed)
Pt setup on home cpap unit  plugged in red outlet spo2 93% pt tol well RT will continue to monitor

## 2021-07-02 NOTE — Progress Notes (Signed)
HOSPITAL MEDICINE ACCEPTANCE NOTE    61 year old female with past medical history of diastolic congestive heart failure, diabetes mellitus type 2, obstructive sleep apnea on CPAP, hyperlipidemia, hypertension, coronary artery disease who presented to [place] with complaints of low back and right flank pain the evening of 1/12.   Patient presented with multiple SIRS criteria including lactic acidosis of 2.6 and leukocytosis of 13.4.  CT imaging of the abdomen and pelvis revealed a 9 mm UPJ stone on the right with mild right-sided hydronephrosis and perinephric fat stranding.    Concern for sepsis secondary to the stone and therefore ER provider initiated intravenous fluids as well as intravenous ceftriaxone.  ER provider discussed case with Dr. Para March with urology who plans on surgical intervention upon arrival to [facility] long and is requesting hospitalist service to admit the patient once the procedure is complete.  Bed request placed for progressive bed.  Vernelle Emerald  MD [facility]

## 2021-07-02 NOTE — Anesthesia Procedure Notes (Signed)
Date/Time: 07/02/2021 2:35 AM Performed by: Cynda Familia, CRNA Oxygen Delivery Method: Simple face mask Placement Confirmation: positive ETCO2 and breath sounds checked- equal and bilateral Dental Injury: Teeth and Oropharynx as per pre-operative assessment

## 2021-07-02 NOTE — Anesthesia Preprocedure Evaluation (Addendum)
Anesthesia Evaluation  Patient identified by MRN, date of birth, ID band Patient awake    Reviewed: Allergy & Precautions, NPO status , Patient's Chart, lab work & pertinent test results  Airway Mallampati: III  TM Distance: <3 FB Neck ROM: Full    Dental  (+) Teeth Intact, Dental Advisory Given   Pulmonary sleep apnea and Continuous Positive Airway Pressure Ventilation ,    breath sounds clear to auscultation       Cardiovascular hypertension, + CAD and +CHF  + dysrhythmias  Rhythm:Regular Rate:Tachycardia     Neuro/Psych negative neurological ROS  negative psych ROS   GI/Hepatic negative GI ROS, Neg liver ROS,   Endo/Other  diabetes, Type 2  Renal/GU negative Renal ROS     Musculoskeletal negative musculoskeletal ROS (+)   Abdominal (+) + obese,   Peds  Hematology negative hematology ROS (+)   Anesthesia Other Findings   Reproductive/Obstetrics                            Anesthesia Physical Anesthesia Plan  ASA: 3 and emergent  Anesthesia Plan: General   Post-op Pain Management:    Induction: Intravenous, Rapid sequence and Cricoid pressure planned  PONV Risk Score and Plan: 4 or greater and Ondansetron, Dexamethasone, Midazolam and Scopolamine patch - Pre-op  Airway Management Planned: Oral ETT  Additional Equipment: None  Intra-op Plan:   Post-operative Plan: Extubation in OR  Informed Consent: I have reviewed the patients History and Physical, chart, labs and discussed the procedure including the risks, benefits and alternatives for the proposed anesthesia with the patient or authorized representative who has indicated his/her understanding and acceptance.     Dental advisory given  Plan Discussed with: CRNA  Anesthesia Plan Comments:         Anesthesia Quick Evaluation

## 2021-07-02 NOTE — H&P (View-Only) (Signed)
H&P  Chief Complaint: Fever, Right flank pain  History of Present Illness: Jaime Nichols is a 61 y.o. year old WF who presented to [facility] tonight with acute onset of Right flank pain associated with fever and chills. She is diabetic.Patient with prior history of stones, followed by Dr Louis Meckel at [facility] urology recently. CT urogram confirms 9 mm Right UPJ stone with hydronephrosis. Patient is tachycardic/febrile. She has received IV Rocephin. Patient now for cysto and urgent Right JJ stent placement  Past Medical History:  Diagnosis Date   Aortic atherosclerosis ([place])    CAD in native artery    very high calcium score at 875.  coronary CTA  showed 30% RCA and LAD.   Chronic diastolic CHF (congestive heart failure) ([place]) 2012   Diabetes mellitus type 2 in obese ([place])    Dyspnea    with exertion   Dysrhythmia    PVCs, PAC   Fibroid    History of kidney stones 09/2019   HTN (hypertension)    Hyperlipidemia    Morbid obesity ([facility])    OSA on CPAP    PAT (paroxysmal atrial tachycardia) ([facility])    Premature atrial contractions    PVC's (premature ventricular contractions)     Past Surgical History:  Procedure Laterality Date   ABDOMINAL HYSTERECTOMY  06/20/2008   BSO   Benign uterine polyps  01/18/2009   DILATION AND CURETTAGE OF UTERUS  2010   HYSTEROSCOPY  2010   URETEROSCOPY WITH HOLMIUM LASER LITHOTRIPSY Left 2021    Home Medications:  No current facility-administered medications on file prior to encounter.   Current Outpatient Medications on File Prior to Encounter  Medication Sig Dispense Refill   allopurinol (ZYLOPRIM) 100 MG tablet Take 100 mg by mouth daily.     cholecalciferol (VITAMIN D3) 25 MCG (1000 UNIT) tablet Take 1,000 Units by mouth daily.     colchicine 0.6 MG tablet Take 0.6 mg by mouth every Monday, Wednesday, and Friday.     dapagliflozin propanediol (FARXIGA) 5 MG TABS tablet Take by mouth daily.     diltiazem (CARDIZEM CD) 120 MG 24 hr  capsule TAKE 1 CAPSULE BY MOUTH EVERY DAY 90 capsule 3   Dulaglutide 1.5 MG/0.5ML SOPN Inject 1.5 mg into the skin every Monday.     estradiol (ESTRACE) 0.1 MG/GM vaginal cream Place 1 Applicatorful vaginally every 14 (fourteen) days.     Evolocumab (REPATHA SURECLICK) 175 MG/ML SOAJ Inject 1 pen into the skin every 14 (fourteen) days. 2 mL 11   levalbuterol (XOPENEX HFA) 45 MCG/ACT inhaler Inhale 1 puff into the lungs every 4 (four) hours as needed for shortness of breath.     lisinopril (ZESTRIL) 10 MG tablet Take 1 tablet (10 mg total) by mouth daily. 90 tablet 3   metFORMIN (GLUCOPHAGE-XR) 500 MG 24 hr tablet TAKE 2 TABLETS BY MOUTH TWICE A DAY WITH MEALS (Patient not taking: Reported on 05/04/2021) 360 tablet 0   metoprolol succinate (TOPROL-XL) 25 MG 24 hr tablet Take 1 tablet (25 mg total) by mouth daily. 90 tablet 3   NON FORMULARY Pt uses cpap nightly     potassium citrate (UROCIT-K) 10 MEQ (1080 MG) SR tablet Take 10 mEq by mouth daily.     rosuvastatin (CRESTOR) 5 MG tablet Take one tablet by mouth daily or as tolerated (Patient not taking: Reported on 05/04/2021) 90 tablet 3   terbinafine (LAMISIL) 250 MG tablet Please take one a day x 7days, repeat every 4 weeks x  4 months 28 tablet 0     Allergies:  Allergies  Allergen Reactions   Erythromycin Diarrhea and Nausea And Vomiting   Latex Other (See Comments)    REACTION: "CONTACT DERMATITIS"   Nickel Other (See Comments)    Poor healing   Statins Other (See Comments)    Family History  Problem Relation Age of Onset   Hypertension Mother    Heart disease Father        Died age 26s of a cardiac event - was told it was atherosclerosis but also had PVCs   Healthy Sister    Arrhythmia Sister        PVCs   Depression Brother    Healthy Sister    Diabetes Paternal Grandmother    Arrhythmia Other        Distant relative with WPW.    Social History:  reports that she has never smoked. She has never used smokeless tobacco.  She reports current alcohol use. She reports that she does not use drugs.  ROS: A complete review of systems was performed.  All systems are negative except for pertinent findings as noted.  Physical Exam:  Vital signs in last 24 hours: Temp:  [99.8 F (37.7 C)-100.7 F (38.2 C)] 100 F (37.8 C) (01/13 0037) Pulse Rate:  [114-151] 130 (01/13 0015) Resp:  [16-35] 35 (01/13 0015) BP: (164-185)/(81-107) 164/81 (01/13 0015) SpO2:  [90 %-100 %] 100 % (01/13 0021) Weight:  [644.0 kg] 118.2 kg (01/12 2044) Constitutional:  Alert and oriented, No acute distress Cardiovascular:  Respiratory: Normal respiratory effort, Lungs clear bilaterally GI: Abdomen is soft, nontender, nondistended, no abdominal masses [id number] CVA tenderness Lymphatic: No lymphadenopathy Neurologic: Grossly intact, no focal deficits Psychiatric: Normal mood and affect  Laboratory Data:  Recent Labs    07/01/21 2054  WBC 13.4*  HGB 13.6  HCT 41.3  PLT 220    Recent Labs    07/01/21 2054  NA 137  K 4.1  CL 101  GLUCOSE 255*  BUN 16  CALCIUM 9.4  CREATININE 1.00     Results for orders placed or performed during the hospital encounter of 07/01/21 (from the past 24 hour(s))  Urinalysis, Routine w reflex microscopic Urine, Clean Catch     Status: Abnormal   Collection Time: 07/01/21  8:52 PM  Result Value Ref Range   Color, Urine YELLOW YELLOW   APPearance HAZY (A) CLEAR   Specific Gravity, Urine 1.022 1.005 - 1.030   pH 5.0 5.0 - 8.0   Glucose, UA 500 (A) NEGATIVE mg/dL   Hgb urine dipstick MODERATE (A) NEGATIVE   Bilirubin Urine NEGATIVE NEGATIVE   Ketones, ur 15 (A) NEGATIVE mg/dL   Protein, ur 30 (A) NEGATIVE mg/dL   Nitrite NEGATIVE NEGATIVE   Leukocytes,Ua MODERATE (A) NEGATIVE   RBC / HPF 0-5 0 - 5 RBC/hpf   WBC, UA 6-10 0 - 5 WBC/hpf   Bacteria, UA RARE (A) NONE SEEN   Squamous Epithelial / LPF 0-5 0 - 5   Mucus PRESENT   Basic metabolic panel     Status: Abnormal   Collection Time:  07/01/21  8:54 PM  Result Value Ref Range   Sodium 137 135 - 145 mmol/L   Potassium 4.1 3.5 - 5.1 mmol/L   Chloride 101 98 - 111 mmol/L   CO2 24 22 - 32 mmol/L   Glucose, Bld 255 (H) 70 - 99 mg/dL   BUN 16 8 - 23 mg/dL   Creatinine, Ser  1.00 0.44 - 1.00 mg/dL   Calcium 9.4 8.9 - 10.3 mg/dL   GFR, Estimated >60 >60 mL/min   Anion gap 12 5 - 15  CBC     Status: Abnormal   Collection Time: 07/01/21  8:54 PM  Result Value Ref Range   WBC 13.4 (H) 4.0 - 10.5 K/uL   RBC 4.68 3.87 - 5.11 MIL/uL   Hemoglobin 13.6 12.0 - 15.0 g/dL   HCT 41.3 36.0 - 46.0 %   MCV 88.2 80.0 - 100.0 fL   MCH 29.1 26.0 - 34.0 pg   MCHC 32.9 30.0 - 36.0 g/dL   RDW 13.4 11.5 - 15.5 %   Platelets 220 150 - 400 K/uL   nRBC 0.0 0.0 - 0.2 %  Lactic acid, plasma     Status: Abnormal   Collection Time: 07/01/21 11:06 PM  Result Value Ref Range   Lactic Acid, Venous 2.6 (HH) 0.5 - 1.9 mmol/L  Resp Panel by RT-PCR (Flu A&B, Covid) Nasopharyngeal Swab     Status: None   Collection Time: 07/01/21 11:06 PM   Specimen: Nasopharyngeal Swab; Nasopharyngeal(NP) swabs in vial transport medium  Result Value Ref Range   SARS Coronavirus 2 by RT PCR NEGATIVE NEGATIVE   Influenza A by PCR NEGATIVE NEGATIVE   Influenza B by PCR NEGATIVE NEGATIVE   Recent Results (from the past 240 hour(s))  Resp Panel by RT-PCR (Flu A&B, Covid) Nasopharyngeal Swab     Status: None   Collection Time: 07/01/21 11:06 PM   Specimen: Nasopharyngeal Swab; Nasopharyngeal(NP) swabs in vial transport medium  Result Value Ref Range Status   SARS Coronavirus 2 by RT PCR NEGATIVE NEGATIVE Final    Comment: (NOTE) SARS-CoV-2 target nucleic acids are NOT DETECTED.  The SARS-CoV-2 RNA is generally detectable in upper respiratory specimens during the acute phase of infection. The lowest concentration of SARS-CoV-2 viral copies this assay can detect is 138 copies/mL. A negative result does not preclude SARS-Cov-2 infection and should not be used as the  sole basis for treatment or other patient management decisions. A negative result may occur with  improper specimen collection/handling, submission of specimen other than nasopharyngeal swab, presence of viral mutation(s) within the areas targeted by this assay, and inadequate number of viral copies(<138 copies/mL). A negative result must be combined with clinical observations, patient history, and epidemiological information. The expected result is Negative.  Fact Sheet for Patients:  EntrepreneurPulse.com.au  Fact Sheet for Healthcare Providers:  IncredibleEmployment.be  This test is no t yet approved or cleared by the [place] FDA and  has been authorized for detection and/or diagnosis of SARS-CoV-2 by FDA under an Emergency Use Authorization (EUA). This EUA will remain  in effect (meaning this test can be used) for the duration of the COVID-19 declaration under Section 564(b)(1) of the Act, 21 U.S.C.section 360bbb-3(b)(1), unless the authorization is terminated  or revoked sooner.       Influenza A by PCR NEGATIVE NEGATIVE Final   Influenza B by PCR NEGATIVE NEGATIVE Final    Comment: (NOTE) The Xpert Xpress SARS-CoV-2/FLU/RSV plus assay is intended as an aid in the diagnosis of influenza from Nasopharyngeal swab specimens and should not be used as a sole basis for treatment. Nasal washings and aspirates are unacceptable for Xpert Xpress SARS-CoV-2/FLU/RSV testing.  Fact Sheet for Patients: EntrepreneurPulse.com.au  Fact Sheet for Healthcare Providers: IncredibleEmployment.be  This test is not yet approved or cleared by the [place] FDA and has been authorized for detection and/or  diagnosis of SARS-CoV-2 by FDA under an Emergency Use Authorization (EUA). This EUA will remain in effect (meaning this test can be used) for the duration of the COVID-19 declaration under Section 564(b)(1) of the  Act, 21 U.S.C. section 360bbb-3(b)(1), unless the authorization is terminated or revoked.  Performed at [facility], [address] [place], [place] [address]     Renal Function: Recent Labs    07/01/21 2054  CREATININE 1.00   Estimated Creatinine Clearance: 72.5 mL/min (by C-G formula based on SCr of 1 mg/dL).  Radiologic Imaging: CT ABDOMEN PELVIS W CONTRAST  Result Date: 07/01/2021 CLINICAL DATA:  Abdomen pain EXAM: CT ABDOMEN AND PELVIS WITH CONTRAST TECHNIQUE: Multidetector CT imaging of the abdomen and pelvis was performed using the standard protocol following bolus administration of intravenous contrast. CONTRAST:  136mL OMNIPAQUE IOHEXOL 300 MG/ML  SOLN COMPARISON:  CT 12/14/2020 FINDINGS: Lower chest: Lung bases demonstrate no acute consolidation or effusion. Mild cardiomegaly with coronary vascular calcification. Hepatobiliary: Hepatic steatosis. Status post cholecystectomy. No biliary dilatation Pancreas: Unremarkable. No pancreatic ductal dilatation or surrounding inflammatory changes. Spleen: Splenic granuloma Adrenals/Urinary Tract: Adrenal glands are normal. 5 mm stone within the mid left kidney. 13 mm cyst mid right kidney. Multiple right intrarenal stones. 9 mm right renal pelvis stone, at the UPJ, now with mild hydronephrosis and perinephric fat stranding. The urinary bladder is unremarkable. Delayed excretion of contrast on the right consistent with obstruction. Stomach/Bowel: The stomach is nonenlarged. No dilated small bowel. Negative appendix. No acute bowel wall thickening Vascular/Lymphatic: Mild aortic atherosclerosis. No aneurysm. No suspicious lymph node Reproductive: Status post hysterectomy. No adnexal masses. Other: Negative for pelvic effusion or free air. Diastasis of the rectus with small fat containing periumbilical hernia Musculoskeletal: No acute or significant osseous findings. IMPRESSION: 1. 9 mm right renal pelvis stone with new mild  right hydronephrosis and perinephric fat stranding. Stone is at the right UPJ. There are multiple right greater than left intrarenal stones. 2. Hepatic steatosis. Electronically Signed   By: Donavan Foil M.D.   On: 07/01/2021 22:49    Impression/Assessment:  Right UPJ stone with obstruction/urosepsis  Plan:  Schedule for urgent cysto/ Right JJ stent followed by admissionby hospitalist for IV antibiotics/supportive management  Remi Haggard 07/02/2021, 12:55 AM  Meriam Sprague

## 2021-07-02 NOTE — ED Triage Notes (Signed)
Patient came from [facility] due to have a kidney stone. Patient was called to be brought to OR as soon as her chart was transferred over.

## 2021-07-02 NOTE — Anesthesia Procedure Notes (Signed)
Procedure Name: Intubation Date/Time: 07/02/2021 2:07 AM Performed by: Cynda Familia, CRNA Pre-anesthesia Checklist: Patient identified, Emergency Drugs available, Suction available and Patient being monitored Patient Re-evaluated:Patient Re-evaluated prior to induction Oxygen Delivery Method: Circle System Utilized Preoxygenation: Pre-oxygenation with 100% oxygen Induction Type: IV induction, Cricoid Pressure applied and Rapid sequence Ventilation: Mask ventilation without difficulty Laryngoscope Size: Miller and 2 Grade View: Grade II Tube type: Oral Number of attempts: 1 Airway Equipment and Method: Stylet Placement Confirmation: ETT inserted through vocal cords under direct vision, positive ETCO2 and breath sounds checked- equal and bilateral Secured at: 21 cm Tube secured with: Tape Dental Injury: Teeth and Oropharynx as per pre-operative assessment  Comments: Induction [place]- intubation AM CRNA atraumatic- teeth and mouth as preop bilat BS

## 2021-07-02 NOTE — H&P (Signed)
History and Physical    Jaime Nichols WJX:[id number] DOB: 09-13-1960 DOA: 07/01/2021  PCP: Cari Caraway, MD   Patient coming from: Home  Chief Complaint: Fever, flank pain, dysuria  HPI: Jaime Nichols is a 61 y.o. female with medical history significant for DMT2, OSA, HTN, tachycardia, HFpEF, hx of kidney stones who presents for evaluation of right flank pain associated with fever and chills.  She has prior history of kidney stones and is followed by [facility].  She was seen at the beginning of the week and was told she had increased stones in her kidneys on x-ray.  CT urogram tonight reveals a 9 mm right UPJ stone causing hydronephrosis.  Patient with fever and tachycardia and elevated lactic acid level.  She reports she has had nausea at home.  She took Tylenol at home for the fever.  She has had decreased appetite and did not feel good yesterday prompting her to go to the emergency room.  Her sugars have been running high the last few days but she has been taking her medications.  She has had no injury to her back or abdomen.   ED Course: T-max of 100.7 degrees in the emergency room.  Pulses ranged from the 1 20-1 40 range.  Blood pressure 140-150/67-91.  Found to have an elevated lactic acid level of 2.6 initially which increased to 2.7 after being taken to the operating room.  Lab work showed WBC of 13,400 hemoglobin 13.6 hematocrit 41.3 platelets 220,000.  7 potassium 4.1 chloride 101 bicarb 24 creatinine 1.0 BUN 16 calcium 9.4, COVID-negative.  Influenza a and B are negative.  Urinalysis is a yellow hazy color with moderate leukocyte esterase negative nitrites with large glucose and mild ketones with 6-10 WBCs per high-power field.  Patient was started on Rocephin.  Scan showed obstructing right 9 mm stone causing hydronephrosis and urology was consulted and decision was made to take to the operating room for ureteral stent placement.  Hospital service was asked to admit  for further management  Review of Systems:  General: Reports fever, chills, decreased appetite. Denies weight loss, night sweats.  Denies dizziness.  HENT: Denies head trauma, headache, denies change in hearing, tinnitus. Denies nasal bleeding.  Denies sore throat.  Denies difficulty swallowing Eyes: Denies blurry vision, pain in eye, drainage.  Denies discoloration of eyes. Neck: Denies pain.  Denies swelling.  Denies pain with movement. Cardiovascular: Denies chest pain, palpitations. Denies edema. Denies orthopnea Respiratory: Denies shortness of breath, cough. Denies wheezing.  Denies sputum production Gastrointestinal: Denies abdominal pain, swelling.  Reports nausea but no vomiting, diarrhea.  Denies melena. Denies hematemesis. Reports right flank pain Musculoskeletal: Denies limitation of movement.  Denies deformity or swelling.  Genitourinary: Denies pelvic pain.  Reports urinary hesitancy, dysuria, right flank pain Skin: Denies rash.  Denies petechiae, purpura, ecchymosis. Neurological: Denies syncope. Denies slurred speech, drooping face. Denies visual change. Psychiatric: Denies depression, anxiety. Denies hallucinations.  Past Medical History:  Diagnosis Date   Aortic atherosclerosis ([place])    CAD in native artery    very high calcium score at 875.  coronary CTA  showed 30% RCA and LAD.   Chronic diastolic CHF (congestive heart failure) ([place]) 2012   Diabetes mellitus type 2 in obese [facility])    Dyspnea    with exertion   Dysrhythmia    PVCs, PAC   Fibroid    History of kidney stones 09/2019   HTN (hypertension)    Hyperlipidemia    Morbid  obesity ([facility])    OSA on CPAP    PAT (paroxysmal atrial tachycardia) ([facility])    Premature atrial contractions    PVC's (premature ventricular contractions)     Past Surgical History:  Procedure Laterality Date   ABDOMINAL HYSTERECTOMY  06/20/2008   BSO   Benign uterine polyps  01/18/2009   DILATION AND CURETTAGE OF UTERUS  2010    HYSTEROSCOPY  2010   URETEROSCOPY WITH HOLMIUM LASER LITHOTRIPSY Left 2021    Social History  reports that she has never smoked. She has never used smokeless tobacco. She reports current alcohol use. She reports that she does not use drugs.  Allergies  Allergen Reactions   Erythromycin Diarrhea and Nausea And Vomiting    Other reaction(s): stomach upset   Latex Other (See Comments)    REACTION: "CONTACT DERMATITIS" Other reaction(s): Unknown   Nickel Other (See Comments)    Poor healing   Statins Other (See Comments)   Crestor [Rosuvastatin]     Other reaction(s): joints knees   Metformin Hcl Diarrhea and Other (See Comments)    Diarrhea at higher doses, GI Upset   Zocor [Simvastatin]     Other reaction(s): Unknown    Family History  Problem Relation Age of Onset   Hypertension Mother    Heart disease Father        Died age 37s of a cardiac event - was told it was atherosclerosis but also had PVCs   Healthy Sister    Arrhythmia Sister        PVCs   Depression Brother    Healthy Sister    Diabetes Paternal Grandmother    Arrhythmia Other        Distant relative with WPW.     Prior to Admission medications   Medication Sig Start Date End Date Taking? Authorizing Provider  PRESCRIPTION MEDICATION CPAP- At bedtime   Yes [provider]  allopurinol (ZYLOPRIM) 100 MG tablet Take 100 mg by mouth daily.    [provider]  cholecalciferol (VITAMIN D3) 25 MCG (1000 UNIT) tablet Take 1,000 Units by mouth daily.    [provider]  colchicine 0.6 MG tablet Take 0.6 mg by mouth every Monday, Wednesday, and Friday.    [provider]  dapagliflozin propanediol (FARXIGA) 5 MG TABS tablet Take by mouth daily.    [provider]  diltiazem (CARDIZEM CD) 120 MG 24 hr capsule TAKE 1 CAPSULE BY MOUTH EVERY DAY 05/04/21   Sueanne Margarita, MD  Dulaglutide 1.5 MG/0.5ML SOPN Inject 1.5 mg into the skin every Monday. 09/18/18   [provider]  estradiol (ESTRACE) 0.1 MG/GM vaginal cream Place 1 Applicatorful vaginally every 14 (fourteen) days. 11/24/20   [provider]  Evolocumab (REPATHA SURECLICK) 619 MG/ML SOAJ Inject 1 pen into the skin every 14 (fourteen) days. 05/04/21   Sueanne Margarita, MD  levalbuterol [facility] HFA) 45 MCG/ACT inhaler Inhale 1 puff into the lungs every 4 (four) hours as needed for shortness of breath. 09/27/18   [provider]  lisinopril (ZESTRIL) 10 MG tablet Take 1 tablet (10 mg total) by mouth daily. 05/04/21   Sueanne Margarita, MD  metFORMIN (GLUCOPHAGE-XR) 500 MG 24 hr tablet TAKE 2 TABLETS BY MOUTH TWICE A DAY WITH MEALS 09/25/17   Renato Shin, MD  metoprolol succinate (TOPROL-XL) 25 MG 24 hr tablet Take 1 tablet (25 mg total) by mouth daily. 05/04/21   Sueanne Margarita, MD  potassium citrate (UROCIT-K) 10 MEQ (  1080 MG) SR tablet Take 10 mEq by mouth daily. 07/15/20   [provider]  rosuvastatin (CRESTOR) 5 MG tablet Take one tablet by mouth daily or as tolerated 09/26/19   Sueanne Margarita, MD  terbinafine (LAMISIL) 250 MG tablet Please take one a day x 7days, repeat every 4 weeks x 4 months 07/01/21   Wallene Huh, DPM    Physical Exam: Vitals:   07/02/21 0315 07/02/21 0330 07/02/21 0345 07/02/21 0400  BP: (!) 141/71 136/64  (!) 152/92  Pulse: (!) 127 (!) 125  (!) 127  Resp: 19 19 19  (!) 21  Temp:    99 F (37.2 C)  TempSrc:      SpO2: 97% 97%  98%  Weight:      Height:        Constitutional: NAD, calm, comfortable Vitals:   07/02/21 0315 07/02/21 0330 07/02/21 0345 07/02/21 0400  BP: (!) 141/71 136/64  (!) 152/92  Pulse: (!) 127 (!) 125  (!) 127  Resp: 19 19 19  (!) 21  Temp:    99 F (37.2 C)  TempSrc:      SpO2: 97% 97%  98%  Weight:      Height:       General: WDWN, Alert and oriented x3.  Eyes: EOMI, PERRL, conjunctivae normal.  Sclera nonicteric HENT:  [place]/AT, external ears normal.  Nares patent without epistasis.  Mucous membranes are  moist. Posterior pharynx clear of any exudate. Normal dentition.  Neck: Soft, normal range of motion, supple, no masses, Trachea midline Respiratory: clear to auscultation bilaterally, no wheezing, no crackles. Normal respiratory effort. No accessory muscle use.  Cardiovascular: Regular rate and rhythm, no murmurs / rubs / gallops. No extremity edema. 2+ pedal pulses.   Abdomen: Soft, no tenderness, nondistended, no rebound or guarding. Morbid obesity. Bowel sounds normoactive. Right flank pain Musculoskeletal: FROM. no cyanosis. No joint deformity upper and lower extremities. Normal muscle tone.  Skin: Warm, dry, intact no rashes, lesions, ulcers. No induration Neurologic: CN 2-12 grossly intact.  Normal speech.  Sensation intact to touch. Strength 5/5 in all extremities.   Psychiatric: Normal judgment and insight. Normal mood.    Labs on Admission: I have personally reviewed following labs and imaging studies  CBC: Recent Labs  Lab 07/01/21 2054  WBC 13.4*  HGB 13.6  HCT 41.3  MCV 88.2  PLT 132    Basic Metabolic Panel: Recent Labs  Lab 07/01/21 2054  NA 137  K 4.1  CL 101  CO2 24  GLUCOSE 255*  BUN 16  CREATININE 1.00  CALCIUM 9.4    GFR: Estimated Creatinine Clearance: 72.5 mL/min (by C-G formula based on SCr of 1 mg/dL).  Liver Function Tests: No results for input(s): AST, ALT, ALKPHOS, BILITOT, PROT, ALBUMIN in the last 168 hours.  Urine analysis:    Component Value Date/Time   COLORURINE YELLOW 07/01/2021 2052   APPEARANCEUR HAZY (A) 07/01/2021 2052   LABSPEC 1.022 07/01/2021 2052   PHURINE 5.0 07/01/2021 2052   GLUCOSEU 500 (A) 07/01/2021 2052   HGBUR MODERATE (A) 07/01/2021 2052   BILIRUBINUR NEGATIVE 07/01/2021 2052   KETONESUR 15 (A) 07/01/2021 2052   PROTEINUR 30 (A) 07/01/2021 2052   NITRITE NEGATIVE 07/01/2021 2052   LEUKOCYTESUR MODERATE (A) 07/01/2021 2052    Radiological Exams on Admission: CT ABDOMEN PELVIS W CONTRAST  Result Date:  07/01/2021 CLINICAL DATA:  Abdomen pain EXAM: CT ABDOMEN AND PELVIS WITH CONTRAST TECHNIQUE: Multidetector CT imaging of the abdomen  and pelvis was performed using the standard protocol following bolus administration of intravenous contrast. CONTRAST:  158mL OMNIPAQUE IOHEXOL 300 MG/ML  SOLN COMPARISON:  CT 12/14/2020 FINDINGS: Lower chest: Lung bases demonstrate no acute consolidation or effusion. Mild cardiomegaly with coronary vascular calcification. Hepatobiliary: Hepatic steatosis. Status post cholecystectomy. No biliary dilatation Pancreas: Unremarkable. No pancreatic ductal dilatation or surrounding inflammatory changes. Spleen: Splenic granuloma Adrenals/Urinary Tract: Adrenal glands are normal. 5 mm stone within the mid left kidney. 13 mm cyst mid right kidney. Multiple right intrarenal stones. 9 mm right renal pelvis stone, at the UPJ, now with mild hydronephrosis and perinephric fat stranding. The urinary bladder is unremarkable. Delayed excretion of contrast on the right consistent with obstruction. Stomach/Bowel: The stomach is nonenlarged. No dilated small bowel. Negative appendix. No acute bowel wall thickening Vascular/Lymphatic: Mild aortic atherosclerosis. No aneurysm. No suspicious lymph node Reproductive: Status post hysterectomy. No adnexal masses. Other: Negative for pelvic effusion or free air. Diastasis of the rectus with small fat containing periumbilical hernia Musculoskeletal: No acute or significant osseous findings. IMPRESSION: 1. 9 mm right renal pelvis stone with new mild right hydronephrosis and perinephric fat stranding. Stone is at the right UPJ. There are multiple right greater than left intrarenal stones. 2. Hepatic steatosis. Electronically Signed   By: Donavan Foil M.D.   On: 07/01/2021 22:49   DG C-Arm 1-60 Min-No Report  Result Date: 07/02/2021 Fluoroscopy was utilized by the requesting physician.  No radiographic interpretation.    EKG: Independently reviewed.  EKG  shows sinus tachycardia with left anterior fascicular block.  No acute ST elevation or depression.  QTc prolonged at 571  Assessment/Plan Principal Problem:   Sepsis secondary to UTI Ms. Rohrbach is admitted to Progressive Care unit. She has been taken to OR by Urology for stent placement for 9 mm right UPJ stone with hydronephrosis.  Started on Rocephin 2 gm IV Q 24 hour for antibiotic coverage.  Cultures obtained in ER and will be monitored. Lactic acid level elevated and will check q 6 hr x 3.  IV fluid hydration with LR at 125 ml/hr Check CBC, CMP in am  Active Problems:   Kidney stone 9 mm right UPJ stone causing hydronephrosis.  Has had cystoscopy and stent placed by urology. Urology following    Diabetes type 2, controlled With elevated blood sugar levels this morning with infection.  Given NovoLog.  Monitor blood sugars with meals and at bedtime.  Corrective insulin be provided as needed for glycemic control.  Check hemoglobin A1c. Patient had her metformin discontinued by her PCP recently.  She was unable to tolerate [place].  She is having difficulty obtaining Trulicity due to current shortage of the medication at the pharmacies.  Will need adjustment of her diabetic regimen while in the hospital    Chronic diastolic CHF (congestive heart failure)  Stable.  No signs of volume overload.  Monitor I&Os    Prolonged QT interval Avoid medications which could further prolong QT interval.  Patient is given Tigan as needed for nausea and vomiting she has a significant elevated QT interval    HTN (hypertension) New metoprolol.  Monitor blood pressure    OSA on CPAP Chronic.  Continue CPAP at night    PAT (paroxysmal atrial tachycardia)  Is on metoprolol.  Continue to monitor    Morbid obesity  Follow up with PCP for dietary, lifestyle, pharmacotherapy interventions for weight loss and/or referral for bariatric surgery   DVT prophylaxis: Lovenox for DVT prophylaxis Code  Status:   Full code Family Communication:  Diagnosis and plan discussed with patient.  Patient verbalized understanding agrees with plan.  Further recommendations to follow as clinical indicated Disposition Plan:   Patient is from:  Home  Anticipated DC to:  Home  Anticipated DC date:  Anticipate 2 midnight or more stay in the hospital  Time spent on admission: 75 minutes  Consults called:  Urology, Dr. Milford Cage  Admission status:  Inpatient  Yevonne Aline Bradin Mcadory MD [facility]  How to contact the [facility] Attending or Consulting provider [place] or covering provider during after hours [place], for this patient?   Check the care team in [facility] and look for a) attending/consulting [facility] provider listed and b) the [facility] team listed Log into [email or web address] and use [place]'s universal password to access. If you do not have the password, please contact the hospital operator. Locate the [facility] provider you are looking for under [facility] and page to a number that you can be directly reached. If you still have difficulty reaching the provider, please page the [facility] (Director on Call) for the Hospitalists listed on amion for assistance.  07/02/2021, 4:39 AM

## 2021-07-02 NOTE — Progress Notes (Signed)
PROGRESS NOTE    Jaime Nichols  IWL:[id number] DOB: 12/27/1960 DOA: 07/01/2021 PCP: Cari Caraway, MD   Brief Narrative:  Jaime Nichols is a 61 y.o. female with medical history significant for DMT2, OSA, HTN, tachycardia, HFpEF, hx of kidney stones who presents for evaluation of right flank pain associated with fever and chills.  She has prior history of kidney stones and is followed by [facility].  She was seen at the beginning of the week and was told she had increased stones in her kidneys on x-ray.  CT urogram tonight reveals a 9 mm right UPJ stone causing hydronephrosis. Admitted for urinary obstruction for Urology consult and evaluation. Hospitalist called for admission.  Assessment & Plan:   Sepsis secondary to UTI with obstructive right UPJ nephrolithiasis, POA - Urology following, appreciate insight/recs -status post stenting overnight - Continue Rocephin per urology - Sepsis criteria resolving, concern patient's symptoms may have been secondary to pain rather than infection  - Continue to follow cultures   Non-insulin dependent diabetes type 2, uncontrolled with hyperglycemia - Uncontrolled with A1c 8  - Continue to monitor glucose levels, sliding scale insulin, hypoglycemic protocol - Lengthy discussion at bedside on need for dietary changes and lifestyle adjustment - Recently metformin discontinued by her PCP recently.  She was unable to tolerate [place].  She is having difficulty obtaining Trulicity due to current shortage of the medication at the pharmacies.    Chronic diastolic CHF (congestive heart failure)  Stable.  No signs of volume overload.  Monitor I&Os   Prolonged QT interval Avoid medications which could further prolong QT interval.  Patient is given Tigan as needed for nausea and vomiting she has a significant elevated QT interval   HTN (hypertension) Continue metoprolol  OSA on CPAP Chronic.  Continue CPAP at night   Paroxysmal  atrial tachycardia, rate controlled Continue metoprolol  Morbid obesity Lengthy discussion about need for dietary and lifestyle changes as above     DVT prophylaxis:      Lovenox for DVT prophylaxis Code Status:              Full code Family Communication: None present  Status is: Inpatient  Dispo: The patient is from: Home              Anticipated d/c is to: Home              Anticipated d/c date is: 24 to 48 hours              Patient currently not medically stable for discharge  Consultants:  Neurology  Procedures:  Right UPJ stenting  Antimicrobials:  Ceftriaxone  Subjective: On no acute issues or events overnight tolerated procedure well denies nausea vomiting diarrhea constipation any fevers chills or chest pain.  Right flank pain and urinary symptoms resolving with Foley  Objective: Vitals:   07/02/21 0445 07/02/21 0500 07/02/21 0600 07/02/21 0700  BP: (!) 157/71  (!) 148/79 124/72  Pulse: (!) 125 (!) 125 (!) 125 (!) 123  Resp: 19 20 18  (!) 21  Temp:  98.9 F (37.2 C)    TempSrc:      SpO2: 97% 99% 99% 99%  Weight:      Height:        Intake/Output Summary (Last 24 hours) at 07/02/2021 0756 Last data filed at 07/02/2021 0400 Gross per 24 hour  Intake 1650 ml  Output 550 ml  Net 1100 ml   Filed Weights   07/01/21 2044  Weight: 118.2 kg    Examination:  General exam: Appears calm and comfortable  Respiratory system: Clear to auscultation. Respiratory effort normal. Cardiovascular system: S1 & S2 heard, RRR. No JVD, murmurs, rubs, gallops or clicks. No pedal edema. Gastrointestinal system: Abdomen is nondistended, soft and nontender. No organomegaly or masses felt. Normal bowel sounds heard. Central nervous system: Alert and oriented. No focal neurological deficits. Extremities: Symmetric 5 x 5 power. Skin: No rashes, lesions or ulcers Psychiatry: Judgement and insight appear normal. Mood & affect appropriate.     Data Reviewed: I have personally  reviewed following labs and imaging studies  CBC: Recent Labs  Lab 07/01/21 2054 07/02/21 0514  WBC 13.4* 19.9*  HGB 13.6 12.6  HCT 41.3 39.1  MCV 88.2 92.7  PLT 220 962   Basic Metabolic Panel: Recent Labs  Lab 07/01/21 2054 07/02/21 0514  NA 137 132*  K 4.1 4.0  CL 101 104  CO2 24 21*  GLUCOSE 255* 330*  BUN 16 16  CREATININE 1.00 0.79  CALCIUM 9.4 8.1*   GFR: Estimated Creatinine Clearance: 90.6 mL/min (by C-G formula based on SCr of 0.79 mg/dL). Liver Function Tests: Recent Labs  Lab 07/02/21 0514  AST 32  ALT 50*  ALKPHOS 74  BILITOT 0.8  PROT 6.5  ALBUMIN 3.7   No results for input(s): LIPASE, AMYLASE in the last 168 hours. No results for input(s): AMMONIA in the last 168 hours. Coagulation Profile: No results for input(s): INR, PROTIME in the last 168 hours. Cardiac Enzymes: No results for input(s): CKTOTAL, CKMB, CKMBINDEX, TROPONINI in the last 168 hours. BNP (last 3 results) No results for input(s): PROBNP in the last 8760 hours. HbA1C: Recent Labs    07/02/21 0514  HGBA1C 8.0*   CBG: Recent Labs  Lab 07/02/21 0136 07/02/21 0244 07/02/21 0343 07/02/21 0511 07/02/21 0629  GLUCAP 275* 290* 324* 306* 294*   Lipid Profile: No results for input(s): CHOL, HDL, LDLCALC, TRIG, CHOLHDL, LDLDIRECT in the last 72 hours. Thyroid Function Tests: No results for input(s): TSH, T4TOTAL, FREET4, T3FREE, THYROIDAB in the last 72 hours. Anemia Panel: No results for input(s): VITAMINB12, FOLATE, FERRITIN, TIBC, IRON, RETICCTPCT in the last 72 hours. Sepsis Labs: Recent Labs  Lab 07/01/21 2306 07/02/21 0345 07/02/21 0514  LATICACIDVEN 2.6* 2.7* 2.2*    Recent Results (from the past 240 hour(s))  Resp Panel by RT-PCR (Flu A&B, Covid) Nasopharyngeal Swab     Status: None   Collection Time: 07/01/21 11:06 PM   Specimen: Nasopharyngeal Swab; Nasopharyngeal(NP) swabs in vial transport medium  Result Value Ref Range Status   SARS Coronavirus 2 by RT  PCR NEGATIVE NEGATIVE Final    Comment: (NOTE) SARS-CoV-2 target nucleic acids are NOT DETECTED.  The SARS-CoV-2 RNA is generally detectable in upper respiratory specimens during the acute phase of infection. The lowest concentration of SARS-CoV-2 viral copies this assay can detect is 138 copies/mL. A negative result does not preclude SARS-Cov-2 infection and should not be used as the sole basis for treatment or other patient management decisions. A negative result may occur with  improper specimen collection/handling, submission of specimen other than nasopharyngeal swab, presence of viral mutation(s) within the areas targeted by this assay, and inadequate number of viral copies(<138 copies/mL). A negative result must be combined with clinical observations, patient history, and epidemiological information. The expected result is Negative.  Fact Sheet for Patients:  EntrepreneurPulse.com.au  Fact Sheet for Healthcare Providers:  IncredibleEmployment.be  This test is no t yet approved  or cleared by the [place] and  has been authorized for detection and/or diagnosis of SARS-CoV-2 by FDA under an Emergency Use Authorization (EUA). This EUA will remain  in effect (meaning this test can be used) for the duration of the COVID-19 declaration under Section 564(b)(1) of the Act, 21 U.S.C.section 360bbb-3(b)(1), unless the authorization is terminated  or revoked sooner.       Influenza A by PCR NEGATIVE NEGATIVE Final   Influenza B by PCR NEGATIVE NEGATIVE Final    Comment: (NOTE) The Xpert Xpress SARS-CoV-2/FLU/RSV plus assay is intended as an aid in the diagnosis of influenza from Nasopharyngeal swab specimens and should not be used as a sole basis for treatment. Nasal washings and aspirates are unacceptable for Xpert Xpress SARS-CoV-2/FLU/RSV testing.  Fact Sheet for Patients: EntrepreneurPulse.com.au  Fact Sheet for  Healthcare Providers: IncredibleEmployment.be  This test is not yet approved or cleared by the [place] FDA and has been authorized for detection and/or diagnosis of SARS-CoV-2 by FDA under an Emergency Use Authorization (EUA). This EUA will remain in effect (meaning this test can be used) for the duration of the COVID-19 declaration under Section 564(b)(1) of the Act, 21 U.S.C. section 360bbb-3(b)(1), unless the authorization is terminated or revoked.  Performed at [facility], [address], [place], [place] [address]          Radiology Studies: CT ABDOMEN PELVIS W CONTRAST  Result Date: 07/01/2021 CLINICAL DATA:  Abdomen pain EXAM: CT ABDOMEN AND PELVIS WITH CONTRAST TECHNIQUE: Multidetector CT imaging of the abdomen and pelvis was performed using the standard protocol following bolus administration of intravenous contrast. CONTRAST:  167mL OMNIPAQUE IOHEXOL 300 MG/ML  SOLN COMPARISON:  CT 12/14/2020 FINDINGS: Lower chest: Lung bases demonstrate no acute consolidation or effusion. Mild cardiomegaly with coronary vascular calcification. Hepatobiliary: Hepatic steatosis. Status post cholecystectomy. No biliary dilatation Pancreas: Unremarkable. No pancreatic ductal dilatation or surrounding inflammatory changes. Spleen: Splenic granuloma Adrenals/Urinary Tract: Adrenal glands are normal. 5 mm stone within the mid left kidney. 13 mm cyst mid right kidney. Multiple right intrarenal stones. 9 mm right renal pelvis stone, at the UPJ, now with mild hydronephrosis and perinephric fat stranding. The urinary bladder is unremarkable. Delayed excretion of contrast on the right consistent with obstruction. Stomach/Bowel: The stomach is nonenlarged. No dilated small bowel. Negative appendix. No acute bowel wall thickening Vascular/Lymphatic: Mild aortic atherosclerosis. No aneurysm. No suspicious lymph node Reproductive: Status post hysterectomy. No adnexal  masses. Other: Negative for pelvic effusion or free air. Diastasis of the rectus with small fat containing periumbilical hernia Musculoskeletal: No acute or significant osseous findings. IMPRESSION: 1. 9 mm right renal pelvis stone with new mild right hydronephrosis and perinephric fat stranding. Stone is at the right UPJ. There are multiple right greater than left intrarenal stones. 2. Hepatic steatosis. Electronically Signed   By: Donavan Foil M.D.   On: 07/01/2021 22:49   DG C-Arm 1-60 Min-No Report  Result Date: 07/02/2021 Fluoroscopy was utilized by the requesting physician.  No radiographic interpretation.    Scheduled Meds:  allopurinol  100 mg Oral Daily   enoxaparin (LOVENOX) injection  40 mg Subcutaneous Q24H   insulin aspart       insulin aspart       insulin aspart  0-15 Units Subcutaneous TID WC & HS   lisinopril  10 mg Oral Daily   metoprolol succinate  25 mg Oral Daily   Continuous Infusions:  acetaminophen     cefTRIAXone (ROCEPHIN)  IV  lactated ringers 125 mL/hr at 07/02/21 0530     LOS: 0 days   Time spent: 42min  Zarion Oliff C Aalaysia Liggins, DO [facility]  If 7PM-7AM, please contact night-coverage [email or web address]  07/02/2021, 7:56 AM

## 2021-07-02 NOTE — Op Note (Signed)
Preoperative diagnosis:  1.  Right UPJ stone with obstruction and urosepsis  Postoperative diagnosis: 1.  Same  Procedure(s): 1.  Cystoscopy, right retrograde pyelogram with intraoperative interpretation, insertion right JJ stent  Surgeon: Dr. Harold Barban  Anesthesia: General  Complications: None  EBL: Minimal  Specimens: None  Disposition of specimens: Not applicable  Intraoperative findings: 9 mm UPJ stone manipulated back to the pelvis.  6 [place] by 24 cm Percuflex plus soft contour stent placed  Indication: 61 year old morbidly obese white female who presents with right-sided flank pain and 9 mm UPJ stone with fever and chills and impending urosepsis.  Presents for cystoscopy and insertion of right JJ stent  Description of procedure:  After obtaining informed consent for the patient she was taken the major cystoscopy suite placed under general anesthesia.  Placed in the dorsolithotomy position genitalia prepped and draped in usual sterile fashion.  Proper pause and timeout was performed.  58 [place] the scope was advanced in the bladder.  No obvious mucosal lesions noted.  Right ureteral orifice was identified and 5 French open tip catheter was manipulated inside the right ureteral orifice.  Gentle retrograde pyelogram was performed which outline the ureter up to the UPJ where a filling defect was seen.  I subsequently passed a sensor wire through the open tip catheter and manipulated this past the stone at the UPJ into the renal pelvis.  The stone subsequently manipulated back into the renal pelvis with passage of the guidewire.  A 6 French by 24 cm Percuflex plus soft Contour stent was placed leaving a proximal coil in the renal pelvis and a distal coil in the bladder.  There was flow of mildly purulent urine noted through and around the stent.  A 16 French silicone catheter was placed at the termination case for fluid management.  Procedure was terminated she was awakened from  anesthesia and taken back to the recovery room in stable condition.  She was tachycardic but again vital signs were otherwise stable.

## 2021-07-02 NOTE — ED Notes (Signed)
Called [facility] to transport patient to [facility]. Nanda Quinton accepting

## 2021-07-02 NOTE — H&P (Signed)
H&P  Chief Complaint: Fever, Right flank pain  History of Present Illness: Jaime Nichols is a 61 y.o. year old WF who presented to [facility] tonight with acute onset of Right flank pain associated with fever and chills. She is diabetic.Patient with prior history of stones, followed by Dr Louis Meckel at [facility] urology recently. CT urogram confirms 9 mm Right UPJ stone with hydronephrosis. Patient is tachycardic/febrile. She has received IV Rocephin. Patient now for cysto and urgent Right JJ stent placement  Past Medical History:  Diagnosis Date   Aortic atherosclerosis ([place])    CAD in native artery    very high calcium score at 875.  coronary CTA  showed 30% RCA and LAD.   Chronic diastolic CHF (congestive heart failure) ([place]) 2012   Diabetes mellitus type 2 in obese ([place])    Dyspnea    with exertion   Dysrhythmia    PVCs, PAC   Fibroid    History of kidney stones 09/2019   HTN (hypertension)    Hyperlipidemia    Morbid obesity ([facility])    OSA on CPAP    PAT (paroxysmal atrial tachycardia) ([facility])    Premature atrial contractions    PVC's (premature ventricular contractions)     Past Surgical History:  Procedure Laterality Date   ABDOMINAL HYSTERECTOMY  06/20/2008   BSO   Benign uterine polyps  01/18/2009   DILATION AND CURETTAGE OF UTERUS  2010   HYSTEROSCOPY  2010   URETEROSCOPY WITH HOLMIUM LASER LITHOTRIPSY Left 2021    Home Medications:  No current facility-administered medications on file prior to encounter.   Current Outpatient Medications on File Prior to Encounter  Medication Sig Dispense Refill   allopurinol (ZYLOPRIM) 100 MG tablet Take 100 mg by mouth daily.     cholecalciferol (VITAMIN D3) 25 MCG (1000 UNIT) tablet Take 1,000 Units by mouth daily.     colchicine 0.6 MG tablet Take 0.6 mg by mouth every Monday, Wednesday, and Friday.     dapagliflozin propanediol (FARXIGA) 5 MG TABS tablet Take by mouth daily.     diltiazem (CARDIZEM CD) 120 MG 24 hr  capsule TAKE 1 CAPSULE BY MOUTH EVERY DAY 90 capsule 3   Dulaglutide 1.5 MG/0.5ML SOPN Inject 1.5 mg into the skin every Monday.     estradiol (ESTRACE) 0.1 MG/GM vaginal cream Place 1 Applicatorful vaginally every 14 (fourteen) days.     Evolocumab (REPATHA SURECLICK) 093 MG/ML SOAJ Inject 1 pen into the skin every 14 (fourteen) days. 2 mL 11   levalbuterol (XOPENEX HFA) 45 MCG/ACT inhaler Inhale 1 puff into the lungs every 4 (four) hours as needed for shortness of breath.     lisinopril (ZESTRIL) 10 MG tablet Take 1 tablet (10 mg total) by mouth daily. 90 tablet 3   metFORMIN (GLUCOPHAGE-XR) 500 MG 24 hr tablet TAKE 2 TABLETS BY MOUTH TWICE A DAY WITH MEALS (Patient not taking: Reported on 05/04/2021) 360 tablet 0   metoprolol succinate (TOPROL-XL) 25 MG 24 hr tablet Take 1 tablet (25 mg total) by mouth daily. 90 tablet 3   NON FORMULARY Pt uses cpap nightly     potassium citrate (UROCIT-K) 10 MEQ (1080 MG) SR tablet Take 10 mEq by mouth daily.     rosuvastatin (CRESTOR) 5 MG tablet Take one tablet by mouth daily or as tolerated (Patient not taking: Reported on 05/04/2021) 90 tablet 3   terbinafine (LAMISIL) 250 MG tablet Please take one a day x 7days, repeat every 4 weeks x  4 months 28 tablet 0     Allergies:  Allergies  Allergen Reactions   Erythromycin Diarrhea and Nausea And Vomiting   Latex Other (See Comments)    REACTION: "CONTACT DERMATITIS"   Nickel Other (See Comments)    Poor healing   Statins Other (See Comments)    Family History  Problem Relation Age of Onset   Hypertension Mother    Heart disease Father        Died age 8s of a cardiac event - was told it was atherosclerosis but also had PVCs   Healthy Sister    Arrhythmia Sister        PVCs   Depression Brother    Healthy Sister    Diabetes Paternal Grandmother    Arrhythmia Other        Distant relative with WPW.    Social History:  reports that she has never smoked. She has never used smokeless tobacco.  She reports current alcohol use. She reports that she does not use drugs.  ROS: A complete review of systems was performed.  All systems are negative except for pertinent findings as noted.  Physical Exam:  Vital signs in last 24 hours: Temp:  [99.8 F (37.7 C)-100.7 F (38.2 C)] 100 F (37.8 C) (01/13 0037) Pulse Rate:  [114-151] 130 (01/13 0015) Resp:  [16-35] 35 (01/13 0015) BP: (164-185)/(81-107) 164/81 (01/13 0015) SpO2:  [90 %-100 %] 100 % (01/13 0021) Weight:  [161.0 kg] 118.2 kg (01/12 2044) Constitutional:  Alert and oriented, No acute distress Cardiovascular:  Respiratory: Normal respiratory effort, Lungs clear bilaterally GI: Abdomen is soft, nontender, nondistended, no abdominal masses RU:EAVWU CVA tenderness Lymphatic: No lymphadenopathy Neurologic: Grossly intact, no focal deficits Psychiatric: Normal mood and affect  Laboratory Data:  Recent Labs    07/01/21 2054  WBC 13.4*  HGB 13.6  HCT 41.3  PLT 220    Recent Labs    07/01/21 2054  NA 137  K 4.1  CL 101  GLUCOSE 255*  BUN 16  CALCIUM 9.4  CREATININE 1.00     Results for orders placed or performed during the hospital encounter of 07/01/21 (from the past 24 hour(s))  Urinalysis, Routine w reflex microscopic Urine, Clean Catch     Status: Abnormal   Collection Time: 07/01/21  8:52 PM  Result Value Ref Range   Color, Urine YELLOW YELLOW   APPearance HAZY (A) CLEAR   Specific Gravity, Urine 1.022 1.005 - 1.030   pH 5.0 5.0 - 8.0   Glucose, UA 500 (A) NEGATIVE mg/dL   Hgb urine dipstick MODERATE (A) NEGATIVE   Bilirubin Urine NEGATIVE NEGATIVE   Ketones, ur 15 (A) NEGATIVE mg/dL   Protein, ur 30 (A) NEGATIVE mg/dL   Nitrite NEGATIVE NEGATIVE   Leukocytes,Ua MODERATE (A) NEGATIVE   RBC / HPF 0-5 0 - 5 RBC/hpf   WBC, UA 6-10 0 - 5 WBC/hpf   Bacteria, UA RARE (A) NONE SEEN   Squamous Epithelial / LPF 0-5 0 - 5   Mucus PRESENT   Basic metabolic panel     Status: Abnormal   Collection Time:  07/01/21  8:54 PM  Result Value Ref Range   Sodium 137 135 - 145 mmol/L   Potassium 4.1 3.5 - 5.1 mmol/L   Chloride 101 98 - 111 mmol/L   CO2 24 22 - 32 mmol/L   Glucose, Bld 255 (H) 70 - 99 mg/dL   BUN 16 8 - 23 mg/dL   Creatinine, Ser  1.00 0.44 - 1.00 mg/dL   Calcium 9.4 8.9 - 10.3 mg/dL   GFR, Estimated >60 >60 mL/min   Anion gap 12 5 - 15  CBC     Status: Abnormal   Collection Time: 07/01/21  8:54 PM  Result Value Ref Range   WBC 13.4 (H) 4.0 - 10.5 K/uL   RBC 4.68 3.87 - 5.11 MIL/uL   Hemoglobin 13.6 12.0 - 15.0 g/dL   HCT 41.3 36.0 - 46.0 %   MCV 88.2 80.0 - 100.0 fL   MCH 29.1 26.0 - 34.0 pg   MCHC 32.9 30.0 - 36.0 g/dL   RDW 13.4 11.5 - 15.5 %   Platelets 220 150 - 400 K/uL   nRBC 0.0 0.0 - 0.2 %  Lactic acid, plasma     Status: Abnormal   Collection Time: 07/01/21 11:06 PM  Result Value Ref Range   Lactic Acid, Venous 2.6 (HH) 0.5 - 1.9 mmol/L  Resp Panel by RT-PCR (Flu A&B, Covid) Nasopharyngeal Swab     Status: None   Collection Time: 07/01/21 11:06 PM   Specimen: Nasopharyngeal Swab; Nasopharyngeal(NP) swabs in vial transport medium  Result Value Ref Range   SARS Coronavirus 2 by RT PCR NEGATIVE NEGATIVE   Influenza A by PCR NEGATIVE NEGATIVE   Influenza B by PCR NEGATIVE NEGATIVE   Recent Results (from the past 240 hour(s))  Resp Panel by RT-PCR (Flu A&B, Covid) Nasopharyngeal Swab     Status: None   Collection Time: 07/01/21 11:06 PM   Specimen: Nasopharyngeal Swab; Nasopharyngeal(NP) swabs in vial transport medium  Result Value Ref Range Status   SARS Coronavirus 2 by RT PCR NEGATIVE NEGATIVE Final    Comment: (NOTE) SARS-CoV-2 target nucleic acids are NOT DETECTED.  The SARS-CoV-2 RNA is generally detectable in upper respiratory specimens during the acute phase of infection. The lowest concentration of SARS-CoV-2 viral copies this assay can detect is 138 copies/mL. A negative result does not preclude SARS-Cov-2 infection and should not be used as the  sole basis for treatment or other patient management decisions. A negative result may occur with  improper specimen collection/handling, submission of specimen other than nasopharyngeal swab, presence of viral mutation(s) within the areas targeted by this assay, and inadequate number of viral copies(<138 copies/mL). A negative result must be combined with clinical observations, patient history, and epidemiological information. The expected result is Negative.  Fact Sheet for Patients:  EntrepreneurPulse.com.au  Fact Sheet for Healthcare Providers:  IncredibleEmployment.be  This test is no t yet approved or cleared by the [place] FDA and  has been authorized for detection and/or diagnosis of SARS-CoV-2 by FDA under an Emergency Use Authorization (EUA). This EUA will remain  in effect (meaning this test can be used) for the duration of the COVID-19 declaration under Section 564(b)(1) of the Act, 21 U.S.C.section 360bbb-3(b)(1), unless the authorization is terminated  or revoked sooner.       Influenza A by PCR NEGATIVE NEGATIVE Final   Influenza B by PCR NEGATIVE NEGATIVE Final    Comment: (NOTE) The Xpert Xpress SARS-CoV-2/FLU/RSV plus assay is intended as an aid in the diagnosis of influenza from Nasopharyngeal swab specimens and should not be used as a sole basis for treatment. Nasal washings and aspirates are unacceptable for Xpert Xpress SARS-CoV-2/FLU/RSV testing.  Fact Sheet for Patients: EntrepreneurPulse.com.au  Fact Sheet for Healthcare Providers: IncredibleEmployment.be  This test is not yet approved or cleared by the [place] FDA and has been authorized for detection and/or  diagnosis of SARS-CoV-2 by FDA under an Emergency Use Authorization (EUA). This EUA will remain in effect (meaning this test can be used) for the duration of the COVID-19 declaration under Section 564(b)(1) of the  Act, 21 U.S.C. section 360bbb-3(b)(1), unless the authorization is terminated or revoked.  Performed at [facility], [address] [place], [place] [address]     Renal Function: Recent Labs    07/01/21 2054  CREATININE 1.00   Estimated Creatinine Clearance: 72.5 mL/min (by C-G formula based on SCr of 1 mg/dL).  Radiologic Imaging: CT ABDOMEN PELVIS W CONTRAST  Result Date: 07/01/2021 CLINICAL DATA:  Abdomen pain EXAM: CT ABDOMEN AND PELVIS WITH CONTRAST TECHNIQUE: Multidetector CT imaging of the abdomen and pelvis was performed using the standard protocol following bolus administration of intravenous contrast. CONTRAST:  137mL OMNIPAQUE IOHEXOL 300 MG/ML  SOLN COMPARISON:  CT 12/14/2020 FINDINGS: Lower chest: Lung bases demonstrate no acute consolidation or effusion. Mild cardiomegaly with coronary vascular calcification. Hepatobiliary: Hepatic steatosis. Status post cholecystectomy. No biliary dilatation Pancreas: Unremarkable. No pancreatic ductal dilatation or surrounding inflammatory changes. Spleen: Splenic granuloma Adrenals/Urinary Tract: Adrenal glands are normal. 5 mm stone within the mid left kidney. 13 mm cyst mid right kidney. Multiple right intrarenal stones. 9 mm right renal pelvis stone, at the UPJ, now with mild hydronephrosis and perinephric fat stranding. The urinary bladder is unremarkable. Delayed excretion of contrast on the right consistent with obstruction. Stomach/Bowel: The stomach is nonenlarged. No dilated small bowel. Negative appendix. No acute bowel wall thickening Vascular/Lymphatic: Mild aortic atherosclerosis. No aneurysm. No suspicious lymph node Reproductive: Status post hysterectomy. No adnexal masses. Other: Negative for pelvic effusion or free air. Diastasis of the rectus with small fat containing periumbilical hernia Musculoskeletal: No acute or significant osseous findings. IMPRESSION: 1. 9 mm right renal pelvis stone with new mild  right hydronephrosis and perinephric fat stranding. Stone is at the right UPJ. There are multiple right greater than left intrarenal stones. 2. Hepatic steatosis. Electronically Signed   By: Donavan Foil M.D.   On: 07/01/2021 22:49    Impression/Assessment:  Right UPJ stone with obstruction/urosepsis  Plan:  Schedule for urgent cysto/ Right JJ stent followed by admissionby hospitalist for IV antibiotics/supportive management  Remi Haggard 07/02/2021, 12:55 AM  Meriam Sprague

## 2021-07-02 NOTE — Progress Notes (Signed)
Inpatient Diabetes Program Recommendations  AACE/ADA: New Consensus Statement on Inpatient Glycemic Control (2015)  Target Ranges:  Prepandial:   less than 140 mg/dL      Peak postprandial:   less than 180 mg/dL (1-2 hours)      Critically ill patients:  140 - 180 mg/dL   Lab Results  Component Value Date   GLUCAP 298 (H) 07/02/2021   HGBA1C 8.0 (H) 07/02/2021    Review of Glycemic Control  Diabetes history: DM2 Outpatient Diabetes medications: Trulicity 3 mg weekly, stopped taking [place] and metformin (a few days ago added back metformin 500 mg QD) Current orders for Inpatient glycemic control: Novolog 0-15 units TID with meals and 0-5 HS  HgbA1C - 8.0% Endo - Dr Almyra Deforest with pt at bedside regarding her diabetes control. Pt states she stopped taking metformin 1000 mg BID d/t GI issues, although has added back 500 mg QD, stopped [place] d/t UTIs and MD ordered Trulicity 4.5 mg weekly, and pt has had problems getting this amount, so back to 3 mg weekly. Pt states she does not want to go on insulin. Discussed importance of getting HgbA1C down to 7%, healthy eating using portion control and exercising several times each week.   Inpatient Diabetes Program Recommendations:    Increase Novolog to 0-20 units TID with meals and 0-5 HS  F/U with Endo after discharge.  Thank you. Lorenda Peck, RD, LDN, CDE Inpatient Diabetes Coordinator [phone]

## 2021-07-02 NOTE — Transfer of Care (Signed)
Immediate Anesthesia Transfer of Care Note  Patient: Jaime Nichols  Procedure(s) Performed: CYSTOSCOPY WITH RETROGRADE PYELOGRAM/URETERAL STENT PLACEMENT (Right: Ureter)  Patient Location: PACU  Anesthesia Type:General  Level of Consciousness: awake and alert   Airway & Oxygen Therapy: Patient Spontanous Breathing and Patient connected to face mask oxygen  Post-op Assessment: Report given to RN and Post -op Vital signs reviewed and stable  Post vital signs: Reviewed and stable  Last Vitals:  Vitals Value Taken Time  BP 156/67 07/02/21 0241  Temp 37.2 C 07/02/21 0241  Pulse 127 07/02/21 0242  Resp 20 07/02/21 0242  SpO2 98 % 07/02/21 0242  Vitals shown include unvalidated device data.  Last Pain:  Vitals:   07/02/21 0132  TempSrc:   PainSc: 3          Complications: No notable events documented.

## 2021-07-03 LAB — URINE CULTURE

## 2021-07-03 LAB — BASIC METABOLIC PANEL
Anion gap: 7 (ref 5–15)
BUN: 16 mg/dL (ref 8–23)
CO2: 26 mmol/L (ref 22–32)
Calcium: 8.8 mg/dL — ABNORMAL LOW (ref 8.9–10.3)
Chloride: 106 mmol/L (ref 98–111)
Creatinine, Ser: 0.75 mg/dL (ref 0.44–1.00)
GFR, Estimated: >60 mL/min (ref >60)
Glucose, Bld: 185 mg/dL — ABNORMAL HIGH (ref 70–99)
Potassium: 3.6 mmol/L (ref 3.5–5.1)
Sodium: 139 mmol/L (ref 135–145)

## 2021-07-03 LAB — CBC
HCT: 36 % (ref 36.0–46.0)
Hemoglobin: 11.3 g/dL — ABNORMAL LOW (ref 12.0–15.0)
MCH: 29.6 pg (ref 26.0–34.0)
MCHC: 31.4 g/dL (ref 30.0–36.0)
MCV: 94.2 fL (ref 80.0–100.0)
Platelets: 219 10*3/uL (ref 150–400)
RBC: 3.82 MIL/uL — ABNORMAL LOW (ref 3.87–5.11)
RDW: 13.6 % (ref 11.5–15.5)
WBC: 13.8 10*3/uL — ABNORMAL HIGH (ref 4.0–10.5)
nRBC: 0 % (ref 0.0–0.2)

## 2021-07-03 LAB — GLUCOSE, CAPILLARY: Glucose-Capillary: 170 mg/dL — ABNORMAL HIGH (ref 70–99)

## 2021-07-03 MED ORDER — ORAL CARE MOUTH RINSE: 15.0000 mL | Freq: Two times a day (BID) | OROMUCOSAL | Status: DC

## 2021-07-03 MED ORDER — CEPHALEXIN 250 MG PO CAPS: 250.0000 mg | ORAL_CAPSULE | Freq: Four times a day (QID) | ORAL | 0 refills | Status: AC

## 2021-07-03 MED ORDER — HYDROCODONE-ACETAMINOPHEN 5-325 MG PO TABS: 1.0000 | ORAL_TABLET | Freq: Four times a day (QID) | ORAL | 0 refills | Status: DC | PRN

## 2021-07-03 MED ORDER — CHLORHEXIDINE GLUCONATE 0.12 % MT SOLN
15.0000 mL | Freq: Two times a day (BID) | OROMUCOSAL | Status: DC
  Administered 2021-07-03: 15 mL via OROMUCOSAL
  Filled 2021-07-03: qty 15

## 2021-07-03 NOTE — Progress Notes (Signed)
Patient continues to complain about burning from the catheter. PRN oxybutynin was not as effective as patient would have liked. Passed along to dayshift nurse and will follow up with the MD.

## 2021-07-03 NOTE — Discharge Summary (Signed)
Physician Discharge Summary  Jaime Nichols CBJ:[id number] DOB: [birth year] DOA: 07/01/2021  PCP: Cari Caraway, MD  Admit date: 07/01/2021 Discharge date: 07/03/2021  Admitted From: Home Disposition:  home  Recommendations for Outpatient Follow-up:  Follow up with PCP in 1-2 weeks Please obtain BMP/CBC in one week Please follow up with urology as scheduled:  Discharge Condition: Stable CODE STATUS: Full Diet recommendation: Soft diet advance as tolerated  Brief/Interim Summary: Jaime Nichols is a 61 y.o. female with medical history significant for DMT2, OSA, HTN, tachycardia, HFpEF, hx of kidney stones who presents for evaluation of right flank pain associated with fever and chills.  She has prior history of kidney stones and is followed by [facility].  She was seen at the beginning of the week and was told she had increased stones in her kidneys on x-ray.  CT urogram tonight reveals a 9 mm right UPJ stone causing hydronephrosis. Admitted for urinary obstruction for Urology consult and evaluation. Hospitalist called for admission.   Assessment & Plan:   Sepsis secondary to UTI with obstructive right UPJ nephrolithiasis, POA - Urology following, appreciate insight/recs -tolerated the procedure well -Pain resolved transition to Keflex for 10-day course given concern for infected stone -Outpatient follow-up with urology as scheduled   Non-insulin dependent diabetes type 2, uncontrolled with hyperglycemia - Uncontrolled with A1c 8  -Lengthy discussion about dietary and lifestyle compliance   Chronic diastolic CHF (congestive heart failure)  Stable.  No signs of volume overload.  Monitor I&Os   Prolonged QT interval Avoid medications which could further prolong QT interval.  Patient is given Tigan as needed for nausea and vomiting she has a significant elevated QT interval   HTN (hypertension) Continue metoprolol   OSA on CPAP Chronic. Continue CPAP at night    Paroxysmal atrial tachycardia, rate controlled Continue metoprolol   Morbid obesity Lengthy discussion about need for dietary and lifestyle changes as above   Discharge Instructions   Allergies as of 07/03/2021       Reactions   Erythromycin Diarrhea, Nausea And Vomiting   Other reaction(s): stomach upset   Latex Other (See Comments)   REACTION: "CONTACT DERMATITIS"   Nickel Other (See Comments)   Poor healing- dental reasons   Statins Other (See Comments)   Joint pain   Crestor [rosuvastatin] Other (See Comments)   Cannot take when taking Colchicine   Metformin Hcl Diarrhea, Other (See Comments)   Diarrhea at higher doses, GI Upset   Potassium Citrate Other (See Comments)   Affected eyes- "redness and crusty" at 30 mEq/daily   Zocor [simvastatin] Other (See Comments)   Joint pain        Medication List     STOP taking these medications    ibuprofen 200 MG tablet Commonly known as: ADVIL   rosuvastatin 5 MG tablet Commonly known as: CRESTOR   terbinafine 250 MG tablet Commonly known as: LamISIL       TAKE these medications    acetaminophen 500 MG tablet Commonly known as: TYLENOL Take 500-1,000 mg by mouth every 6 (six) hours as needed for headache (or pain).   allopurinol 100 MG tablet Commonly known as: ZYLOPRIM Take 200 mg by mouth in the morning.   cephALEXin 250 MG capsule Commonly known as: KEFLEX Take 1 capsule (250 mg total) by mouth 4 (four) times daily for 10 days.   colchicine 0.6 MG tablet Take 0.6 mg by mouth every Monday, Wednesday, and Friday.   diltiazem 120 MG 24 hr  capsule Commonly known as: CARDIZEM CD TAKE 1 CAPSULE BY MOUTH EVERY DAY What changed:  how much to take how to take this when to take this additional instructions   estradiol 0.1 MG/GM vaginal cream Commonly known as: ESTRACE Place 1 Applicatorful vaginally every 14 (fourteen) days.   HYDROcodone-acetaminophen 5-325 MG tablet Commonly known as:  NORCO/VICODIN Take 1-2 tablets by mouth every 6 (six) hours as needed for moderate pain or severe pain.   levalbuterol 45 MCG/ACT inhaler Commonly known as: XOPENEX HFA Inhale 1-2 puffs into the lungs every 4 (four) hours as needed for shortness of breath.   lisinopril 10 MG tablet Commonly known as: ZESTRIL Take 1 tablet (10 mg total) by mouth daily.   metFORMIN 500 MG 24 hr tablet Commonly known as: GLUCOPHAGE-XR TAKE 2 TABLETS BY MOUTH TWICE A DAY WITH MEALS What changed:  how much to take how to take this when to take this additional instructions   metoprolol succinate 25 MG 24 hr tablet Commonly known as: TOPROL-XL Take 1 tablet (25 mg total) by mouth daily.   Potassium Citrate 15 MEQ (1620 MG) Tbcr Take 15 mEq by mouth daily.   PRESCRIPTION MEDICATION CPAP- At bedtime   Repatha SureClick 086 MG/ML Soaj Generic drug: Evolocumab Inject 1 pen into the skin every 14 (fourteen) days.   tiZANidine 4 MG tablet Commonly known as: ZANAFLEX Take 2 mg by mouth every 6 (six) hours as needed for muscle spasms.   Trulicity 3 VH/8.4ON Sopn Generic drug: Dulaglutide Inject 3 mg into the skin every Monday.        Allergies  Allergen Reactions   Erythromycin Diarrhea and Nausea And Vomiting    Other reaction(s): stomach upset   Latex Other (See Comments)    REACTION: "CONTACT DERMATITIS"   Nickel Other (See Comments)    Poor healing- dental reasons   Statins Other (See Comments)    Joint pain   Crestor [Rosuvastatin] Other (See Comments)    Cannot take when taking Colchicine   Metformin Hcl Diarrhea and Other (See Comments)    Diarrhea at higher doses, GI Upset   Potassium Citrate Other (See Comments)    Affected eyes- "redness and crusty" at 30 mEq/daily   Zocor [Simvastatin] Other (See Comments)    Joint pain    Consultations: Urology  Procedures/Studies: CT ABDOMEN PELVIS W CONTRAST  Result Date: 07/01/2021 CLINICAL DATA:  Abdomen pain EXAM: CT ABDOMEN  AND PELVIS WITH CONTRAST TECHNIQUE: Multidetector CT imaging of the abdomen and pelvis was performed using the standard protocol following bolus administration of intravenous contrast. CONTRAST:  154mL OMNIPAQUE IOHEXOL 300 MG/ML  SOLN COMPARISON:  CT 12/14/2020 FINDINGS: Lower chest: Lung bases demonstrate no acute consolidation or effusion. Mild cardiomegaly with coronary vascular calcification. Hepatobiliary: Hepatic steatosis. Status post cholecystectomy. No biliary dilatation Pancreas: Unremarkable. No pancreatic ductal dilatation or surrounding inflammatory changes. Spleen: Splenic granuloma Adrenals/Urinary Tract: Adrenal glands are normal. 5 mm stone within the mid left kidney. 13 mm cyst mid right kidney. Multiple right intrarenal stones. 9 mm right renal pelvis stone, at the UPJ, now with mild hydronephrosis and perinephric fat stranding. The urinary bladder is unremarkable. Delayed excretion of contrast on the right consistent with obstruction. Stomach/Bowel: The stomach is nonenlarged. No dilated small bowel. Negative appendix. No acute bowel wall thickening Vascular/Lymphatic: Mild aortic atherosclerosis. No aneurysm. No suspicious lymph node Reproductive: Status post hysterectomy. No adnexal masses. Other: Negative for pelvic effusion or free air. Diastasis of the rectus with small fat containing periumbilical  hernia Musculoskeletal: No acute or significant osseous findings. IMPRESSION: 1. 9 mm right renal pelvis stone with new mild right hydronephrosis and perinephric fat stranding. Stone is at the right UPJ. There are multiple right greater than left intrarenal stones. 2. Hepatic steatosis. Electronically Signed   By: Donavan Foil M.D.   On: 07/01/2021 22:49   DG C-Arm 1-60 Min-No Report  Result Date: 07/02/2021 Fluoroscopy was utilized by the requesting physician.  No radiographic interpretation.     Subjective: No acute issues or events overnight   Discharge Exam: Vitals:   07/03/21  0447 07/03/21 1316  BP: 117/61 (!) 148/71  Pulse: 95 81  Resp: 18 18  Temp: 98.5 F (36.9 C) 98.4 F (36.9 C)  SpO2: 91% 94%   Vitals:   07/02/21 2037 07/02/21 2249 07/03/21 0447 07/03/21 1316  BP: (!) 141/62  117/61 (!) 148/71  Pulse: 94  95 81  Resp: 20  18 18   Temp: 98.6 F (37 C)  98.5 F (36.9 C) 98.4 F (36.9 C)  TempSrc: Oral   Oral  SpO2: 98% 93% 91% 94%  Weight:      Height:        General: Pt is alert, awake, not in acute distress Cardiovascular: RRR, S1/S2 +, no rubs, no gallops Respiratory: CTA bilaterally, no wheezing, no rhonchi Abdominal: Soft, NT, ND, bowel sounds + Extremities: no edema, no cyanosis    The results of significant diagnostics from this hospitalization (including imaging, microbiology, ancillary and laboratory) are listed below for reference.     Microbiology: Recent Results (from the past 240 hour(s))  Urine Culture     Status: Abnormal   Collection Time: 07/01/21  8:50 PM   Specimen: Urine, Clean Catch  Result Value Ref Range Status   Specimen Description   Final    URINE, CLEAN CATCH Performed at [facility], [address], [place], [place] [address]    Special Requests   Final    NONE Performed at [facility], [address] [place],  [address]    Culture MULTIPLE SPECIES PRESENT, SUGGEST RECOLLECTION (A)  Final   Report Status 07/03/2021 FINAL  Final  Resp Panel by RT-PCR (Flu A&B, Covid) Nasopharyngeal Swab     Status: None   Collection Time: 07/01/21 11:06 PM   Specimen: Nasopharyngeal Swab; Nasopharyngeal(NP) swabs in vial transport medium  Result Value Ref Range Status   SARS Coronavirus 2 by RT PCR NEGATIVE NEGATIVE Final    Comment: (NOTE) SARS-CoV-2 target nucleic acids are NOT DETECTED.  The SARS-CoV-2 RNA is generally detectable in upper respiratory specimens during the acute phase of infection. The lowest concentration of SARS-CoV-2 viral copies this assay can  detect is 138 copies/mL. A negative result does not preclude SARS-Cov-2 infection and should not be used as the sole basis for treatment or other patient management decisions. A negative result may occur with  improper specimen collection/handling, submission of specimen other than nasopharyngeal swab, presence of viral mutation(s) within the areas targeted by this assay, and inadequate number of viral copies(<138 copies/mL). A negative result must be combined with clinical observations, patient history, and epidemiological information. The expected result is Negative.  Fact Sheet for Patients:  EntrepreneurPulse.com.au  Fact Sheet for Healthcare Providers:  IncredibleEmployment.be  This test is no t yet approved or cleared by the [place] FDA and  has been authorized for detection and/or diagnosis of SARS-CoV-2 by FDA under an Emergency Use Authorization (EUA). This EUA will remain  in  effect (meaning this test can be used) for the duration of the COVID-19 declaration under Section 564(b)(1) of the Act, 21 U.S.C.section 360bbb-3(b)(1), unless the authorization is terminated  or revoked sooner.       Influenza A by PCR NEGATIVE NEGATIVE Final   Influenza B by PCR NEGATIVE NEGATIVE Final    Comment: (NOTE) The Xpert Xpress SARS-CoV-2/FLU/RSV plus assay is intended as an aid in the diagnosis of influenza from Nasopharyngeal swab specimens and should not be used as a sole basis for treatment. Nasal washings and aspirates are unacceptable for Xpert Xpress SARS-CoV-2/FLU/RSV testing.  Fact Sheet for Patients: EntrepreneurPulse.com.au  Fact Sheet for Healthcare Providers: IncredibleEmployment.be  This test is not yet approved or cleared by the [place] FDA and has been authorized for detection and/or diagnosis of SARS-CoV-2 by FDA under an Emergency Use Authorization (EUA). This EUA will remain in  effect (meaning this test can be used) for the duration of the COVID-19 declaration under Section 564(b)(1) of the Act, 21 U.S.C. section 360bbb-3(b)(1), unless the authorization is terminated or revoked.  Performed at [facility], [address], [place], [place] [address]      Labs: BNP (last 3 results) No results for input(s): BNP in the last 8760 hours. Basic Metabolic Panel: Recent Labs  Lab 07/01/21 2054 07/02/21 0514 07/03/21 0530  NA 137 132* 139  K 4.1 4.0 3.6  CL 101 104 106  CO2 24 21* 26  GLUCOSE 255* 330* 185*  BUN 16 16 16   CREATININE 1.00 0.79 0.75  CALCIUM 9.4 8.1* 8.8*   Liver Function Tests: Recent Labs  Lab 07/02/21 0514  AST 32  ALT 50*  ALKPHOS 74  BILITOT 0.8  PROT 6.5  ALBUMIN 3.7   No results for input(s): LIPASE, AMYLASE in the last 168 hours. No results for input(s): AMMONIA in the last 168 hours. CBC: Recent Labs  Lab 07/01/21 2054 07/02/21 0514 07/03/21 0530  WBC 13.4* 19.9* 13.8*  HGB 13.6 12.6 11.3*  HCT 41.3 39.1 36.0  MCV 88.2 92.7 94.2  PLT 220 187 219   Cardiac Enzymes: No results for input(s): CKTOTAL, CKMB, CKMBINDEX, TROPONINI in the last 168 hours. BNP: Invalid input(s): POCBNP CBG: Recent Labs  Lab 07/02/21 0805 07/02/21 1146 07/02/21 1616 07/02/21 2033 07/03/21 0713  GLUCAP 286* 298* 317* 213* 170*   D-Dimer No results for input(s): DDIMER in the last 72 hours. Hgb A1c Recent Labs    07/02/21 0514  HGBA1C 8.0*   Lipid Profile No results for input(s): CHOL, HDL, LDLCALC, TRIG, CHOLHDL, LDLDIRECT in the last 72 hours. Thyroid function studies No results for input(s): TSH, T4TOTAL, T3FREE, THYROIDAB in the last 72 hours.  Invalid input(s): FREET3 Anemia work up No results for input(s): VITAMINB12, FOLATE, FERRITIN, TIBC, IRON, RETICCTPCT in the last 72 hours. Urinalysis    Component Value Date/Time   COLORURINE YELLOW 07/01/2021 2052   APPEARANCEUR HAZY (A) 07/01/2021 2052    LABSPEC 1.022 07/01/2021 2052   PHURINE 5.0 07/01/2021 2052   GLUCOSEU 500 (A) 07/01/2021 2052   HGBUR MODERATE (A) 07/01/2021 2052   BILIRUBINUR NEGATIVE 07/01/2021 2052   KETONESUR 15 (A) 07/01/2021 2052   PROTEINUR 30 (A) 07/01/2021 2052   NITRITE NEGATIVE 07/01/2021 2052   LEUKOCYTESUR MODERATE (A) 07/01/2021 2052   Sepsis Labs Invalid input(s): PROCALCITONIN,  WBC,  LACTICIDVEN Microbiology Recent Results (from the past 240 hour(s))  Urine Culture     Status: Abnormal   Collection Time: 07/01/21  8:50 PM  Specimen: Urine, Clean Catch  Result Value Ref Range Status   Specimen Description   Final    URINE, CLEAN CATCH Performed at [facility], [address] [place], [place] [address]    Special Requests   Final    NONE Performed at [facility], [address] [place], [place] [address]    Culture MULTIPLE SPECIES PRESENT, SUGGEST RECOLLECTION (A)  Final   Report Status 07/03/2021 FINAL  Final  Resp Panel by RT-PCR (Flu A&B, Covid) Nasopharyngeal Swab     Status: None   Collection Time: 07/01/21 11:06 PM   Specimen: Nasopharyngeal Swab; Nasopharyngeal(NP) swabs in vial transport medium  Result Value Ref Range Status   SARS Coronavirus 2 by RT PCR NEGATIVE NEGATIVE Final    Comment: (NOTE) SARS-CoV-2 target nucleic acids are NOT DETECTED.  The SARS-CoV-2 RNA is generally detectable in upper respiratory specimens during the acute phase of infection. The lowest concentration of SARS-CoV-2 viral copies this assay can detect is 138 copies/mL. A negative result does not preclude SARS-Cov-2 infection and should not be used as the sole basis for treatment or other patient management decisions. A negative result may occur with  improper specimen collection/handling, submission of specimen other than nasopharyngeal swab, presence of viral mutation(s) within the areas targeted by this assay, and inadequate number of  viral copies(<138 copies/mL). A negative result must be combined with clinical observations, patient history, and epidemiological information. The expected result is Negative.  Fact Sheet for Patients:  EntrepreneurPulse.com.au  Fact Sheet for Healthcare Providers:  IncredibleEmployment.be  This test is no t yet approved or cleared by the [place] FDA and  has been authorized for detection and/or diagnosis of SARS-CoV-2 by FDA under an Emergency Use Authorization (EUA). This EUA will remain  in effect (meaning this test can be used) for the duration of the COVID-19 declaration under Section 564(b)(1) of the Act, 21 U.S.C.section 360bbb-3(b)(1), unless the authorization is terminated  or revoked sooner.       Influenza A by PCR NEGATIVE NEGATIVE Final   Influenza B by PCR NEGATIVE NEGATIVE Final    Comment: (NOTE) The Xpert Xpress SARS-CoV-2/FLU/RSV plus assay is intended as an aid in the diagnosis of influenza from Nasopharyngeal swab specimens and should not be used as a sole basis for treatment. Nasal washings and aspirates are unacceptable for Xpert Xpress SARS-CoV-2/FLU/RSV testing.  Fact Sheet for Patients: EntrepreneurPulse.com.au  Fact Sheet for Healthcare Providers: IncredibleEmployment.be  This test is not yet approved or cleared by the [place] FDA and has been authorized for detection and/or diagnosis of SARS-CoV-2 by FDA under an Emergency Use Authorization (EUA). This EUA will remain in effect (meaning this test can be used) for the duration of the COVID-19 declaration under Section 564(b)(1) of the Act, 21 U.S.C. section 360bbb-3(b)(1), unless the authorization is terminated or revoked.  Performed at [facility], [address], [place], [place] [address]      Time coordinating discharge: Over 30 minutes  SIGNED:   Little Ishikawa, DO [facility] 07/03/2021, 3:57 PM Pager   If 7PM-7AM, please contact night-coverage [email or web address]

## 2021-07-03 NOTE — TOC CM/SW Note (Signed)
°  Transition of Care [facility]) Screening Note   Patient Details  Name: Jaime Nichols Date of Birth: 08-15-1960   Transition of Care [facility]) CM/SW Contact:    Ross Ludwig, LCSW Phone Number: 07/03/2021, 1:30 PM    Transition of Care Department [facility]) has reviewed patient and no TOC needs have been identified at this time. We will continue to monitor patient advancement through interdisciplinary progression rounds. If new patient transition needs arise, please place a TOC consult.

## 2021-07-03 NOTE — Plan of Care (Signed)
°  Problem: Education: Goal: Knowledge of General Education information will improve Description: Including pain rating scale, medication(s)/side effects and non-pharmacologic comfort measures Outcome: Progressing   Problem: Coping: Goal: Level of anxiety will decrease Outcome: Progressing   Problem: Elimination: Goal: Will not experience complications related to urinary retention Outcome: Progressing   Problem: Pain Managment: Goal: General experience of comfort will improve Outcome: Progressing   Problem: Safety: Goal: Ability to remain free from injury will improve Outcome: Progressing   Problem: Skin Integrity: Goal: Risk for impaired skin integrity will decrease Outcome: Progressing   Problem: Respiratory: Goal: Ability to maintain adequate ventilation will improve Outcome: Progressing   

## 2021-07-05 ENCOUNTER — Encounter (HOSPITAL_COMMUNITY): Payer: Self-pay | Admitting: Urology

## 2021-07-05 LAB — LACTIC ACID, PLASMA: Lactic Acid, Venous: 2.6 mmol/L (ref 0.5–1.9)

## 2021-07-05 NOTE — Anesthesia Postprocedure Evaluation (Signed)
Anesthesia Post Note  Patient: Jaime Nichols  Procedure(s) Performed: CYSTOSCOPY WITH RETROGRADE PYELOGRAM/URETERAL STENT PLACEMENT (Right: Ureter)     Patient location during evaluation: PACU Anesthesia Type: General Level of consciousness: awake and alert Pain management: pain level controlled Vital Signs Assessment: post-procedure vital signs reviewed and stable Respiratory status: spontaneous breathing, nonlabored ventilation, respiratory function stable and patient connected to nasal cannula oxygen Cardiovascular status: blood pressure returned to baseline and stable Postop Assessment: no apparent nausea or vomiting Anesthetic complications: no   No notable events documented.              Effie Berkshire

## 2021-07-06 ENCOUNTER — Other Ambulatory Visit: Payer: Self-pay | Admitting: Urology

## 2021-07-08 DIAGNOSIS — N390 Urinary tract infection, site not specified: Secondary | ICD-10-CM | POA: Diagnosis not present

## 2021-07-08 DIAGNOSIS — E790 Hyperuricemia without signs of inflammatory arthritis and tophaceous disease: Secondary | ICD-10-CM | POA: Diagnosis not present

## 2021-07-08 DIAGNOSIS — R9431 Abnormal electrocardiogram [ECG] [EKG]: Secondary | ICD-10-CM | POA: Diagnosis not present

## 2021-07-08 DIAGNOSIS — Z79899 Other long term (current) drug therapy: Secondary | ICD-10-CM | POA: Diagnosis not present

## 2021-07-08 DIAGNOSIS — E559 Vitamin D deficiency, unspecified: Secondary | ICD-10-CM | POA: Diagnosis not present

## 2021-07-08 DIAGNOSIS — N200 Calculus of kidney: Secondary | ICD-10-CM | POA: Diagnosis not present

## 2021-07-08 DIAGNOSIS — N178 Other acute kidney failure: Secondary | ICD-10-CM | POA: Diagnosis not present

## 2021-07-08 DIAGNOSIS — A419 Sepsis, unspecified organism: Secondary | ICD-10-CM | POA: Diagnosis not present

## 2021-07-12 ENCOUNTER — Telehealth: Payer: Self-pay | Admitting: Cardiology

## 2021-07-12 DIAGNOSIS — R9431 Abnormal electrocardiogram [ECG] [EKG]: Secondary | ICD-10-CM

## 2021-07-12 NOTE — Telephone Encounter (Addendum)
Called patient back with Dr. Theodosia Blender advisement.     " I think her low voltage on EKG that reads out as anterior infarct is related to lead placement due to large breasts.  Please get a 2D echo to assess for regional wall motion abnormalities.  She had a coronary CTA in 2020 that showed nonobstructive disease.  The sinus tachycardia was related to her kidney issues she was having at the time of her admission to the hospital in January "  Patient was wondering if she can take tizanidine for muscle spasms. Patient's PCP had her hold for now until she heard back from her cardiologist. Patient stated it really helps with the pain and bladder spasms.

## 2021-07-12 NOTE — Telephone Encounter (Signed)
Patient had a kidney stone that blocked the kidney . She could not pass urine on her own, She had to have emergency surgery 07/02/21. She had a stent placed. She is on antibiotics and will have another surgery once the kidney infection clears up to have the stent removed. She had an abnormal EKG during her hospital stay. Her PCP wanted her to have Dr. Radford Pax take a look at that EKG (dated 07/03/21). Her PCP has advised her to stop taking a pain medication for the kidney spasms until her heart is checked out  She is scheduled to see Melina Copa 10/11/21

## 2021-07-13 NOTE — Telephone Encounter (Signed)
Yes, she can try tizanidine. However she should watch her blood pressure. Tizanidine and lisinopril can cause hypotension.

## 2021-07-13 NOTE — Telephone Encounter (Signed)
Spoke with the patient and gave her recommendations from PharmD. Patient verbalized understanding. She states that she does monitor her BP at home and it usually runs around 150/80. She will continue to monitor after starting tizanidine for any hypotension.

## 2021-07-16 ENCOUNTER — Other Ambulatory Visit: Payer: Self-pay

## 2021-07-16 ENCOUNTER — Ambulatory Visit (HOSPITAL_COMMUNITY): Payer: BC Managed Care – PPO | Attending: Cardiovascular Disease

## 2021-07-16 DIAGNOSIS — I509 Heart failure, unspecified: Secondary | ICD-10-CM | POA: Diagnosis not present

## 2021-07-16 DIAGNOSIS — E1165 Type 2 diabetes mellitus with hyperglycemia: Secondary | ICD-10-CM | POA: Diagnosis not present

## 2021-07-16 DIAGNOSIS — R9431 Abnormal electrocardiogram [ECG] [EKG]: Secondary | ICD-10-CM | POA: Insufficient documentation

## 2021-07-16 DIAGNOSIS — N209 Urinary calculus, unspecified: Secondary | ICD-10-CM | POA: Diagnosis not present

## 2021-07-16 LAB — ECHOCARDIOGRAM COMPLETE
Area-P 1/2: 3.19 cm2
S' Lateral: 2.7 cm

## 2021-07-19 DIAGNOSIS — E790 Hyperuricemia without signs of inflammatory arthritis and tophaceous disease: Secondary | ICD-10-CM | POA: Diagnosis not present

## 2021-07-22 ENCOUNTER — Encounter (HOSPITAL_BASED_OUTPATIENT_CLINIC_OR_DEPARTMENT_OTHER): Payer: Self-pay | Admitting: Urology

## 2021-07-22 ENCOUNTER — Other Ambulatory Visit: Payer: Self-pay

## 2021-07-22 NOTE — Progress Notes (Signed)
Spoke w/ via phone for pre-op interview---patient Lab needs dos----none               Lab results------EKG 07/02/21, Echo 07/16/21; BMP and CBC 07/03/21 COVID test -----patient states asymptomatic no test needed Arrive at -------0800 07/28/21 NPO after MN NO Solid Food.  Clear liquids from MN until---0700 am Med rec completed Medications to take morning of surgery -----diltiazem and metoprolol with sips of water; levabuterol PRN Diabetic medication -----Meformin (not to take DOS) Patient instructed no nail polish to be worn day of surgery Patient instructed to bring photo id and insurance card day of surgery Patient aware to have Driver (ride ) / caregiver    for 24 hours after surgery  Patient Special Instructions -----NA Pre-Op special Istructions -----NA Patient verbalized understanding of instructions that were given at this phone interview. Patient denies shortness of breath, chest pain, fever, cough at this phone interview.

## 2021-07-23 ENCOUNTER — Telehealth: Payer: Self-pay | Admitting: Cardiology

## 2021-07-23 NOTE — Telephone Encounter (Signed)
Please complete PA

## 2021-07-23 NOTE — Telephone Encounter (Signed)
Pt c/o medication issue:  1. Name of Medication: Evolocumab (REPATHA SURECLICK) 045 MG/ML SOAJ  2. How are you currently taking this medication (dosage and times per day)? Inject 1 pen into the skin every 14 (fourteen) days.  3. Are you having a reaction (difficulty breathing--STAT)? no  4. What is your medication issue?  Patient is insurance is now requiring prior authorization in ordered to refill her Evolocumab (REPATHA SURECLICK) 997 MG/ML SOAJ

## 2021-07-26 NOTE — Telephone Encounter (Signed)
Jaime Nichols (Key: [id number]) Repatha SureClick 140MG /ML auto-injectors   Form Librarian, academic Form (CB)

## 2021-07-28 ENCOUNTER — Other Ambulatory Visit: Payer: Self-pay

## 2021-07-28 ENCOUNTER — Ambulatory Visit (HOSPITAL_BASED_OUTPATIENT_CLINIC_OR_DEPARTMENT_OTHER)
Admission: RE | Admit: 2021-07-28 | Discharge: 2021-07-28 | Disposition: A | Discharge status: Home / Self Care | Payer: BC Managed Care – PPO | Attending: Urology | Admitting: Urology

## 2021-07-28 ENCOUNTER — Ambulatory Visit (HOSPITAL_BASED_OUTPATIENT_CLINIC_OR_DEPARTMENT_OTHER): Payer: BC Managed Care – PPO | Admitting: Anesthesiology

## 2021-07-28 ENCOUNTER — Encounter (HOSPITAL_BASED_OUTPATIENT_CLINIC_OR_DEPARTMENT_OTHER): Payer: Self-pay | Admitting: Urology

## 2021-07-28 ENCOUNTER — Encounter (HOSPITAL_BASED_OUTPATIENT_CLINIC_OR_DEPARTMENT_OTHER)
Admission: RE | Disposition: A | Discharge status: Home / Self Care | Payer: Self-pay | Source: Home / Self Care | Attending: Urology

## 2021-07-28 DIAGNOSIS — N132 Hydronephrosis with renal and ureteral calculous obstruction: Secondary | ICD-10-CM | POA: Diagnosis not present

## 2021-07-28 DIAGNOSIS — I5032 Chronic diastolic (congestive) heart failure: Secondary | ICD-10-CM | POA: Diagnosis not present

## 2021-07-28 DIAGNOSIS — I110 Hypertensive heart disease with heart failure: Secondary | ICD-10-CM | POA: Diagnosis not present

## 2021-07-28 DIAGNOSIS — I2510 Atherosclerotic heart disease of native coronary artery without angina pectoris: Secondary | ICD-10-CM | POA: Insufficient documentation

## 2021-07-28 DIAGNOSIS — E119 Type 2 diabetes mellitus without complications: Secondary | ICD-10-CM | POA: Diagnosis not present

## 2021-07-28 DIAGNOSIS — N201 Calculus of ureter: Secondary | ICD-10-CM | POA: Diagnosis not present

## 2021-07-28 DIAGNOSIS — G4733 Obstructive sleep apnea (adult) (pediatric): Secondary | ICD-10-CM | POA: Diagnosis not present

## 2021-07-28 DIAGNOSIS — Z6841 Body Mass Index (BMI) 40.0 and over, adult: Secondary | ICD-10-CM | POA: Insufficient documentation

## 2021-07-28 DIAGNOSIS — N200 Calculus of kidney: Secondary | ICD-10-CM

## 2021-07-28 DIAGNOSIS — Z466 Encounter for fitting and adjustment of urinary device: Secondary | ICD-10-CM | POA: Diagnosis not present

## 2021-07-28 HISTORY — PX: CYSTOSCOPY/URETEROSCOPY/HOLMIUM LASER/STENT PLACEMENT: SHX6546

## 2021-07-28 LAB — GLUCOSE, CAPILLARY
Glucose-Capillary: 189 mg/dL — ABNORMAL HIGH (ref 70–99)
Glucose-Capillary: 320 mg/dL — ABNORMAL HIGH (ref 70–99)
Glucose-Capillary: 281 mg/dL — ABNORMAL HIGH (ref 70–99)

## 2021-07-28 SURGERY — CYSTOSCOPY/URETEROSCOPY/HOLMIUM LASER/STENT PLACEMENT
Anesthesia: General | Site: Renal | Laterality: Right

## 2021-07-28 MED ORDER — MIDAZOLAM HCL 5 MG/5ML IJ SOLN
INTRAMUSCULAR | Status: DC | PRN
  Administered 2021-07-28 (×2): 1 mg via INTRAVENOUS

## 2021-07-28 MED ORDER — FENTANYL CITRATE (PF) 100 MCG/2ML IJ SOLN
INTRAMUSCULAR | Status: AC
  Filled 2021-07-28: qty 2

## 2021-07-28 MED ORDER — OXYCODONE HCL 5 MG PO TABS
ORAL_TABLET | ORAL | Status: AC
  Filled 2021-07-28: qty 1

## 2021-07-28 MED ORDER — ACETAMINOPHEN 500 MG PO TABS
ORAL_TABLET | ORAL | Status: AC
  Filled 2021-07-28: qty 2

## 2021-07-28 MED ORDER — DEXAMETHASONE SODIUM PHOSPHATE 10 MG/ML IJ SOLN
INTRAMUSCULAR | Status: DC | PRN
  Administered 2021-07-28: 5 mg via INTRAVENOUS

## 2021-07-28 MED ORDER — ACETAMINOPHEN 500 MG PO TABS
1000.0000 mg | ORAL_TABLET | Freq: Once | ORAL | Status: AC
  Administered 2021-07-28: 1000 mg via ORAL

## 2021-07-28 MED ORDER — FENTANYL CITRATE (PF) 100 MCG/2ML IJ SOLN
INTRAMUSCULAR | Status: DC | PRN
  Administered 2021-07-28: 25 ug via INTRAVENOUS
  Administered 2021-07-28 (×2): 50 ug via INTRAVENOUS
  Administered 2021-07-28: 25 ug via INTRAVENOUS
  Administered 2021-07-28: 50 ug via INTRAVENOUS

## 2021-07-28 MED ORDER — GENTAMICIN SULFATE 40 MG/ML IJ SOLN
5.0000 mg/kg | INTRAVENOUS | Status: AC
  Administered 2021-07-28: 390 mg via INTRAVENOUS
  Filled 2021-07-28: qty 9.75

## 2021-07-28 MED ORDER — ONDANSETRON HCL 4 MG/2ML IJ SOLN
INTRAMUSCULAR | Status: AC
  Filled 2021-07-28: qty 2

## 2021-07-28 MED ORDER — MIDAZOLAM HCL 2 MG/2ML IJ SOLN
INTRAMUSCULAR | Status: AC
  Filled 2021-07-28: qty 2

## 2021-07-28 MED ORDER — LIDOCAINE 2% (20 MG/ML) 5 ML SYRINGE
INTRAMUSCULAR | Status: DC | PRN
  Administered 2021-07-28: 100 mg via INTRAVENOUS

## 2021-07-28 MED ORDER — FENTANYL CITRATE (PF) 100 MCG/2ML IJ SOLN
25.0000 ug | INTRAMUSCULAR | Status: DC | PRN
  Administered 2021-07-28: 50 ug via INTRAVENOUS

## 2021-07-28 MED ORDER — ONDANSETRON HCL 4 MG/2ML IJ SOLN
INTRAMUSCULAR | Status: DC | PRN
Start: 2021-07-28 — End: 2021-07-28
  Administered 2021-07-28: 4 mg via INTRAVENOUS

## 2021-07-28 MED ORDER — INSULIN ASPART 100 UNIT/ML IJ SOLN
INTRAMUSCULAR | Status: AC
  Filled 2021-07-28: qty 1

## 2021-07-28 MED ORDER — LACTATED RINGERS IV SOLN
INTRAVENOUS | Status: DC
  Administered 2021-07-28: 1000 mL via INTRAVENOUS

## 2021-07-28 MED ORDER — PROPOFOL 10 MG/ML IV BOLUS
INTRAVENOUS | Status: DC | PRN
  Administered 2021-07-28: 20 mg via INTRAVENOUS
  Administered 2021-07-28: 200 mg via INTRAVENOUS

## 2021-07-28 MED ORDER — SODIUM CHLORIDE 0.9 % IR SOLN
Status: DC | PRN
  Administered 2021-07-28 (×2): 3000 mL

## 2021-07-28 MED ORDER — DEXAMETHASONE SODIUM PHOSPHATE 10 MG/ML IJ SOLN
INTRAMUSCULAR | Status: AC
  Filled 2021-07-28: qty 1

## 2021-07-28 MED ORDER — PHENAZOPYRIDINE HCL 200 MG PO TABS: 200.0000 mg | ORAL_TABLET | Freq: Three times a day (TID) | ORAL | 0 refills | Status: DC | PRN

## 2021-07-28 MED ORDER — TIZANIDINE HCL 4 MG PO TABS: 2.0000 mg | ORAL_TABLET | Freq: Four times a day (QID) | ORAL | 0 refills | Status: DC | PRN

## 2021-07-28 MED ORDER — PROPOFOL 10 MG/ML IV BOLUS
INTRAVENOUS | Status: AC
  Filled 2021-07-28: qty 20

## 2021-07-28 MED ORDER — CIPROFLOXACIN HCL 500 MG PO TABS: 500.0000 mg | ORAL_TABLET | Freq: Once | ORAL | 0 refills | Status: AC

## 2021-07-28 MED ORDER — IOHEXOL 300 MG/ML  SOLN
INTRAMUSCULAR | Status: DC | PRN
  Administered 2021-07-28: 10 mL

## 2021-07-28 MED ORDER — TIZANIDINE HCL 4 MG PO TABS: 2.0000 mg | ORAL_TABLET | Freq: Four times a day (QID) | ORAL | 0 refills | Status: AC | PRN

## 2021-07-28 MED ORDER — INSULIN REGULAR HUMAN 100 UNIT/ML IJ SOLN
10.0000 [IU] | Freq: Once | INTRAMUSCULAR | Status: AC
  Administered 2021-07-28: 10 [IU] via SUBCUTANEOUS

## 2021-07-28 MED ORDER — OXYCODONE HCL 5 MG PO TABS
5.0000 mg | ORAL_TABLET | Freq: Once | ORAL | Status: AC
  Administered 2021-07-28: 5 mg via ORAL

## 2021-07-28 SURGICAL SUPPLY — 29 items
BAG DRAIN URO-CYSTO SKYTR STRL (DRAIN) ×2 IMPLANT
BAG DRN UROCATH (DRAIN) ×1
BASKET LASER NITINOL 1.9FR (BASKET) IMPLANT
BASKET STONE 1.7 NGAGE (UROLOGICAL SUPPLIES) ×1 IMPLANT
BASKET ZERO TIP NITINOL 2.4FR (BASKET) ×1 IMPLANT
BSKT STON RTRVL 120 1.9FR (BASKET)
BSKT STON RTRVL ZERO TP 2.4FR (BASKET) ×1
BULB IRRIG PATHFIND (MISCELLANEOUS) ×1 IMPLANT
CATH URET 5FR 28IN OPEN ENDED (CATHETERS) ×2 IMPLANT
CATH URET DUAL LUMEN 6-10FR 50 (CATHETERS) IMPLANT
CLOTH BEACON ORANGE TIMEOUT ST (SAFETY) ×2 IMPLANT
COVER DOME SNAP 22 D (MISCELLANEOUS) ×1 IMPLANT
EXTRACTOR STONE 1.7FRX115CM (UROLOGICAL SUPPLIES) IMPLANT
GLOVE SURG ENC MOIS LTX SZ7.5 (GLOVE) ×5 IMPLANT
GLOVE SURG UNDER POLY LF SZ7 (GLOVE) ×2 IMPLANT
GOWN STRL REUS W/TWL LRG LVL3 (GOWN DISPOSABLE) ×1 IMPLANT
GOWN STRL REUS W/TWL XL LVL3 (GOWN DISPOSABLE) ×2 IMPLANT
GUIDEWIRE STR DUAL SENSOR (WIRE) ×3 IMPLANT
IV NS IRRIG 3000ML ARTHROMATIC (IV SOLUTION) ×4 IMPLANT
KIT TURNOVER CYSTO (KITS) ×2 IMPLANT
MANIFOLD NEPTUNE II (INSTRUMENTS) ×2 IMPLANT
NS IRRIG 500ML POUR BTL (IV SOLUTION) ×1 IMPLANT
PACK CYSTO (CUSTOM PROCEDURE TRAY) ×2 IMPLANT
SHEATH URET ACCESS 12FR/35CM (UROLOGICAL SUPPLIES) ×1 IMPLANT
STENT URET 6FRX24 CONTOUR (STENTS) ×1 IMPLANT
TRACTIP FLEXIVA PULS ID 200XHI (Laser) IMPLANT
TRACTIP FLEXIVA PULSE ID 200 (Laser)
TUBE CONNECTING 12X1/4 (SUCTIONS) ×1 IMPLANT
TUBING UROLOGY SET (TUBING) ×2 IMPLANT

## 2021-07-28 NOTE — Anesthesia Preprocedure Evaluation (Signed)
Anesthesia Evaluation  Patient identified by MRN, date of birth, ID band Patient awake    Reviewed: Allergy & Precautions, NPO status , Patient's Chart, lab work & pertinent test results, reviewed documented beta blocker date and time   Airway Mallampati: III  TM Distance: >3 FB Neck ROM: Full    Dental  (+) Teeth Intact, Dental Advisory Given   Pulmonary sleep apnea and Continuous Positive Airway Pressure Ventilation ,    Pulmonary exam normal breath sounds clear to auscultation       Cardiovascular hypertension, Pt. on medications and Pt. on home beta blockers + CAD and +CHF  Normal cardiovascular exam+ dysrhythmias  Rhythm:Regular Rate:Normal     Neuro/Psych negative neurological ROS     GI/Hepatic negative GI ROS, Neg liver ROS,   Endo/Other  diabetes, Type 2, Oral Hypoglycemic AgentsMorbid obesity  Renal/GU Renal disease (RIGHT KIDNEY STONE)     Musculoskeletal negative musculoskeletal ROS (+)   Abdominal   Peds  Hematology negative hematology ROS (+)   Anesthesia Other Findings Day of surgery medications reviewed with the patient.  Reproductive/Obstetrics                             Anesthesia Physical Anesthesia Plan  ASA: 3  Anesthesia Plan: General   Post-op Pain Management: Tylenol PO (pre-op)   Induction: Intravenous  PONV Risk Score and Plan: 4 or greater and Midazolam, Dexamethasone and Ondansetron  Airway Management Planned: LMA  Additional Equipment:   Intra-op Plan:   Post-operative Plan: Extubation in OR  Informed Consent: I have reviewed the patients History and Physical, chart, labs and discussed the procedure including the risks, benefits and alternatives for the proposed anesthesia with the patient or authorized representative who has indicated his/her understanding and acceptance.     Dental advisory given  Plan Discussed with: CRNA  Anesthesia  Plan Comments:         Anesthesia Quick Evaluation

## 2021-07-28 NOTE — Op Note (Signed)
Preoperative diagnosis: right ureteral/renal calculus  Postoperative diagnosis: right ureteral calculus  Procedure:  Cystoscopy right ureteroscopy and stone removal Ureteroscopic laser lithotripsy right 15F x 24 ureteral stent exchange right retrograde pyelography with interpretation  Surgeon: Ardis Hughs, MD  Anesthesia: General  Complications: None  Intraoperative findings:  1: The right retrograde pyelogram demonstrated normal caliber ureter with a filling defect at the UPJ as well as in the upper pole consistent with the patient/stone. 2.:  The patient had a stone or stones located in the upper pole most medial calyx which was a very difficult access point.  There was a stone fragment remaining in that pole that I was unable to retrieve after 30 to 35 minutes of trying. #3: The patient had a 24 cm x 6 French double-J ureteral stent placed.  EBL: Minimal  Specimens: right ureteral calculus  Disposition of specimens: [facility] for stone analysis  Indication: Jaime Nichols is a 61 y.o.   patient with a history of kidney stones who presented to the emergency department 3 weeks ago with flank pain and fever.  She had a stent placed and was placed on antibiotics for several weeks.  She presents today for completion of the ureteroscopy and stone removal.   After reviewing the management options for treatment, the patient elected to proceed with the above surgical procedure(s). We have discussed the potential benefits and risks of the procedure, side effects of the proposed treatment, the likelihood of the patient achieving the goals of the procedure, and any potential problems that might occur during the procedure or recuperation. Informed consent has been obtained.   Description of procedure:  The patient was taken to the operating room and general anesthesia was induced.  The patient was placed in the dorsal lithotomy position, prepped and draped in  the usual sterile fashion, and preoperative antibiotics were administered. A preoperative time-out was performed.   Cystourethroscopy was performed.  The patients urethra was examined and was normal.  Stent was emanating from the patient's right ureteral orifice was grasped with stent grasper and pulled to the urethral meatus.  I then advanced a wire through the stent and remove the stent over the wire.  I then exchanged the wire for an open-ended catheter and performed retrograde pyelogram with the above findings.  I then replaced the wire and repassed the cystoscope.  I passed a second wire into the upper pole of the right kidney.  I subsequently advanced a 12/14 French ureteral access sheath that was passed under fluoroscopic guidance into the proximal ureter.  The inner portion of the access sheath as well as the wire were subsequently removed.  I then used the flexible ureteroscope to access the stones that were at the UVJ and pushed them into the upper pole.  There was an additional stone in the most medial upper pole calyx that was quite difficult to access.  All stones were fragmented with a 274 m laser fiber.  The fragments were then removed with an engage basket.  The stone in the most medial upper pole calyx that was difficult to access was ultimately grasped with a 0 tip basket and pulled into the UPJ.  I then fragmented the stone into several small pieces.  1 of these pieces then floated into that calyx again and after 30 minutes of attempts I was unable to retrieve that.  I did remove all the other fragments.  I subsequently removed the access sheath and inspected the ureter on  the way out.  There was no abnormal findings or unusual trauma from the access sheath.  The wire was then backloaded through the cystoscope and a ureteral stent was advance over the wire using Seldinger technique.  The stent was positioned appropriately under fluoroscopic and cystoscopic guidance.  The wire was then  removed with an adequate stent curl noted in the renal pelvis as well as in the bladder.  The bladder was then emptied and the procedure ended.  The patient appeared to tolerate the procedure well and without complications.  The patient was able to be awakened and transferred to the recovery unit in satisfactory condition.   Disposition: The tether of the stent was left on and tucked inside the patient's vagina.  Instructions for removing the stent have been provided to the patient. The patient has been scheduled for followup in 6 weeks with a renal ultrasound.

## 2021-07-28 NOTE — Transfer of Care (Signed)
Immediate Anesthesia Transfer of Care Note  Patient: Jaime Nichols  Procedure(s) Performed: Procedure(s) (LRB): RIGHT URETEROSCOPY/ RETROGRADE / HOLMIUM LASER/ STONE BASKETRY, STENT EXCHANGE (Right)  Patient Location: PACU  Anesthesia Type: General  Level of Consciousness: awake, oriented, sedated and patient cooperative  Airway & Oxygen Therapy: Patient Spontanous Breathing and Patient connected to face mask oxygen  Post-op Assessment: Report given to PACU RN and Post -op Vital signs reviewed and stable  Post vital signs: Reviewed and stable  Complications: No apparent anesthesia complications Last Vitals:  Vitals Value Taken Time  BP 143/72 07/28/21 1243  Temp    Pulse 88 07/28/21 1244  Resp 19 07/28/21 1244  SpO2 93 % 07/28/21 1244  Vitals shown include unvalidated device data.  Last Pain:  Vitals:   07/28/21 0847  TempSrc: Oral  PainSc: 1       Patients Stated Pain Goal: 5 (88/82/80 0349)  Complications: No notable events documented.

## 2021-07-28 NOTE — Discharge Instructions (Addendum)
DISCHARGE INSTRUCTIONS FOR KIDNEY STONE/URETERAL STENT   MEDICATIONS:  1.  Resume all your other meds from home - except do not take any extra narcotic pain meds that you may have at home.  2. Pyridium is to help with the burning/stinging when you urinate. 3. Take the perocet as needed that you have been previously prescribed.  4. Take Cipro one hour prior to removal of your stent.   ACTIVITY:  1. No strenuous activity x 1week  2. No driving while on narcotic pain medications  3. Drink plenty of water  4. Continue to walk at home - you can still get blood clots when you are at home, so keep active, but don't over do it.  5. May return to work/school tomorrow or when you feel ready   BATHING:  1. You can shower and we recommend daily showers  2. You have a string coming from your urethra: The stent string is attached to your ureteral stent. Do not pull on this.   SIGNS/SYMPTOMS TO CALL:  Please call us if you have a fever greater than 101.5, uncontrolled nausea/vomiting, uncontrolled pain, dizziness, unable to urinate, bloody urine, chest pain, shortness of breath, leg swelling, leg pain, redness around wound, drainage from wound, or any other concerns or questions.   You can reach [place] at [phone].   FOLLOW-UP:  1. You have an appointment in 6 weeks with a ultrasound of your kidneys prior.   2. You have a string attached to your stent, you may remove it on 08/02/21 . To do this, pull the strings until the stents are completely removed. You may feel an odd sensation in your back.    Post Anesthesia Home Care Instructions  Activity: Get plenty of rest for the remainder of the day. A responsible individual must stay with you for 24 hours following the procedure.  For the next 24 hours, DO NOT: -Drive a car -Paediatric nurse -Drink alcoholic beverages -Take any medication unless instructed by your physician -Make any legal decisions or sign important papers.  Meals: Start with  liquid foods such as gelatin or soup. Progress to regular foods as tolerated. Avoid greasy, spicy, heavy foods. If nausea and/or vomiting occur, drink only clear liquids until the nausea and/or vomiting subsides. Call your physician if vomiting continues.  Special Instructions/Symptoms: Your throat may feel dry or sore from the anesthesia or the breathing tube placed in your throat during surgery. If this causes discomfort, gargle with warm salt water. The discomfort should disappear within 24 hours.

## 2021-07-28 NOTE — Anesthesia Procedure Notes (Signed)
Procedure Name: LMA Insertion Date/Time: 07/28/2021 10:09 AM Performed by: Rogers Blocker, CRNA Pre-anesthesia Checklist: Patient identified, Emergency Drugs available, Suction available and Patient being monitored Patient Re-evaluated:Patient Re-evaluated prior to induction Oxygen Delivery Method: Circle System Utilized Preoxygenation: Pre-oxygenation with 100% oxygen Induction Type: IV induction Ventilation: Mask ventilation without difficulty LMA: LMA inserted LMA Size: 4.0 Number of attempts: 1 Placement Confirmation: positive ETCO2 Tube secured with: Tape Dental Injury: Teeth and Oropharynx as per pre-operative assessment

## 2021-07-28 NOTE — Interval H&P Note (Signed)
History and Physical Interval Note:  07/28/2021 9:55 AM  Jaime Nichols  has presented today for surgery, with the diagnosis of RIGHT KIDNEY STONE.  The various methods of treatment have been discussed with the patient and family. After consideration of risks, benefits and other options for treatment, the patient has consented to  Procedure(s) with comments: RIGHT URETEROSCOPY/HOLMIUM LASER/STENT EXCHANGE (Right) - 90 MINS FOR CASE as a surgical intervention.  The patient's history has been reviewed, patient examined, no change in status, stable for surgery.  I have reviewed the patient's chart and labs.  Questions were answered to the patient's satisfaction.     Ardis Hughs

## 2021-07-28 NOTE — Anesthesia Postprocedure Evaluation (Signed)
Anesthesia Post Note  Patient: Shaunta Oncale Warth  Procedure(s) Performed: RIGHT URETEROSCOPY/ RETROGRADE / HOLMIUM LASER/ STONE BASKETRY, STENT EXCHANGE (Right: Renal)     Patient location during evaluation: PACU Anesthesia Type: General Level of consciousness: awake and alert Pain management: pain level controlled Vital Signs Assessment: post-procedure vital signs reviewed and stable Respiratory status: spontaneous breathing, nonlabored ventilation, respiratory function stable and patient connected to nasal cannula oxygen Cardiovascular status: blood pressure returned to baseline and stable Postop Assessment: no apparent nausea or vomiting Anesthetic complications: no   No notable events documented.  Last Vitals:  Vitals:   07/28/21 1345 07/28/21 1400  BP: 131/67 132/71  Pulse: 85 92  Resp: 16 (!) 21  Temp:    SpO2: 93%     Last Pain:  Vitals:   07/28/21 1330  TempSrc:   PainSc: [place]

## 2021-07-29 ENCOUNTER — Encounter (HOSPITAL_BASED_OUTPATIENT_CLINIC_OR_DEPARTMENT_OTHER): Payer: Self-pay | Admitting: Urology

## 2021-07-31 ENCOUNTER — Inpatient Hospital Stay (HOSPITAL_BASED_OUTPATIENT_CLINIC_OR_DEPARTMENT_OTHER)
Admission: EM | Admit: 2021-07-31 | Discharge: 2021-08-03 | DRG: 758 | Disposition: A | Discharge status: Home / Self Care | Payer: BC Managed Care – PPO | Attending: Internal Medicine | Admitting: Internal Medicine

## 2021-07-31 ENCOUNTER — Emergency Department (HOSPITAL_BASED_OUTPATIENT_CLINIC_OR_DEPARTMENT_OTHER): Payer: BC Managed Care – PPO

## 2021-07-31 ENCOUNTER — Other Ambulatory Visit: Payer: Self-pay

## 2021-07-31 ENCOUNTER — Encounter (HOSPITAL_BASED_OUTPATIENT_CLINIC_OR_DEPARTMENT_OTHER): Payer: Self-pay

## 2021-07-31 DIAGNOSIS — Z9889 Other specified postprocedural states: Secondary | ICD-10-CM

## 2021-07-31 DIAGNOSIS — Z881 Allergy status to other antibiotic agents status: Secondary | ICD-10-CM | POA: Diagnosis not present

## 2021-07-31 DIAGNOSIS — I10 Essential (primary) hypertension: Secondary | ICD-10-CM | POA: Diagnosis present

## 2021-07-31 DIAGNOSIS — Z888 Allergy status to other drugs, medicaments and biological substances status: Secondary | ICD-10-CM

## 2021-07-31 DIAGNOSIS — E785 Hyperlipidemia, unspecified: Secondary | ICD-10-CM | POA: Diagnosis present

## 2021-07-31 DIAGNOSIS — Z8619 Personal history of other infectious and parasitic diseases: Secondary | ICD-10-CM

## 2021-07-31 DIAGNOSIS — I493 Ventricular premature depolarization: Secondary | ICD-10-CM | POA: Diagnosis present

## 2021-07-31 DIAGNOSIS — Z818 Family history of other mental and behavioral disorders: Secondary | ICD-10-CM

## 2021-07-31 DIAGNOSIS — I110 Hypertensive heart disease with heart failure: Secondary | ICD-10-CM | POA: Diagnosis not present

## 2021-07-31 DIAGNOSIS — Z79899 Other long term (current) drug therapy: Secondary | ICD-10-CM | POA: Diagnosis not present

## 2021-07-31 DIAGNOSIS — K760 Fatty (change of) liver, not elsewhere classified: Secondary | ICD-10-CM | POA: Diagnosis not present

## 2021-07-31 DIAGNOSIS — Z7984 Long term (current) use of oral hypoglycemic drugs: Secondary | ICD-10-CM

## 2021-07-31 DIAGNOSIS — I2510 Atherosclerotic heart disease of native coronary artery without angina pectoris: Secondary | ICD-10-CM | POA: Diagnosis not present

## 2021-07-31 DIAGNOSIS — N200 Calculus of kidney: Secondary | ICD-10-CM | POA: Diagnosis not present

## 2021-07-31 DIAGNOSIS — Z20822 Contact with and (suspected) exposure to covid-19: Secondary | ICD-10-CM | POA: Diagnosis present

## 2021-07-31 DIAGNOSIS — Z8249 Family history of ischemic heart disease and other diseases of the circulatory system: Secondary | ICD-10-CM

## 2021-07-31 DIAGNOSIS — Z91040 Latex allergy status: Secondary | ICD-10-CM

## 2021-07-31 DIAGNOSIS — A419 Sepsis, unspecified organism: Secondary | ICD-10-CM | POA: Diagnosis not present

## 2021-07-31 DIAGNOSIS — I471 Supraventricular tachycardia: Secondary | ICD-10-CM | POA: Diagnosis present

## 2021-07-31 DIAGNOSIS — E1165 Type 2 diabetes mellitus with hyperglycemia: Secondary | ICD-10-CM | POA: Diagnosis not present

## 2021-07-31 DIAGNOSIS — R000 Tachycardia, unspecified: Secondary | ICD-10-CM | POA: Diagnosis not present

## 2021-07-31 DIAGNOSIS — I517 Cardiomegaly: Secondary | ICD-10-CM | POA: Diagnosis not present

## 2021-07-31 DIAGNOSIS — Z87442 Personal history of urinary calculi: Secondary | ICD-10-CM

## 2021-07-31 DIAGNOSIS — N390 Urinary tract infection, site not specified: Secondary | ICD-10-CM | POA: Diagnosis present

## 2021-07-31 DIAGNOSIS — B3749 Other urogenital candidiasis: Secondary | ICD-10-CM | POA: Diagnosis not present

## 2021-07-31 DIAGNOSIS — I5032 Chronic diastolic (congestive) heart failure: Secondary | ICD-10-CM | POA: Diagnosis not present

## 2021-07-31 DIAGNOSIS — Z833 Family history of diabetes mellitus: Secondary | ICD-10-CM

## 2021-07-31 DIAGNOSIS — Z6841 Body Mass Index (BMI) 40.0 and over, adult: Secondary | ICD-10-CM

## 2021-07-31 DIAGNOSIS — N10 Acute tubulo-interstitial nephritis: Secondary | ICD-10-CM | POA: Diagnosis not present

## 2021-07-31 DIAGNOSIS — G4733 Obstructive sleep apnea (adult) (pediatric): Secondary | ICD-10-CM | POA: Diagnosis not present

## 2021-07-31 DIAGNOSIS — Z960 Presence of urogenital implants: Secondary | ICD-10-CM | POA: Diagnosis present

## 2021-07-31 LAB — CBC WITH DIFFERENTIAL/PLATELET
Abs Immature Granulocytes: 0.08 10*3/uL — ABNORMAL HIGH (ref 0.00–0.07)
Basophils Absolute: 0 10*3/uL (ref 0.0–0.1)
Basophils Relative: 0 %
Eosinophils Absolute: 0.1 10*3/uL (ref 0.0–0.5)
Eosinophils Relative: 0 %
HCT: 38.9 % (ref 36.0–46.0)
Hemoglobin: 12.7 g/dL (ref 12.0–15.0)
Immature Granulocytes: 1 %
Lymphocytes Relative: 11 %
Lymphs Abs: 1.2 10*3/uL (ref 0.7–4.0)
MCH: 28.3 pg (ref 26.0–34.0)
MCHC: 32.6 g/dL (ref 30.0–36.0)
MCV: 86.8 fL (ref 80.0–100.0)
Monocytes Absolute: 1.1 10*3/uL — ABNORMAL HIGH (ref 0.1–1.0)
Monocytes Relative: 10 %
Neutro Abs: 9 10*3/uL — ABNORMAL HIGH (ref 1.7–7.7)
Neutrophils Relative %: 78 %
Platelets: 228 10*3/uL (ref 150–400)
RBC: 4.48 MIL/uL (ref 3.87–5.11)
RDW: 13.2 % (ref 11.5–15.5)
WBC: 11.5 10*3/uL — ABNORMAL HIGH (ref 4.0–10.5)
nRBC: 0 % (ref 0.0–0.2)

## 2021-07-31 LAB — URINALYSIS, ROUTINE W REFLEX MICROSCOPIC
Bilirubin Urine: NEGATIVE
Glucose, UA: NEGATIVE mg/dL
Ketones, ur: 15 mg/dL — AB
Nitrite: NEGATIVE
Protein, ur: 30 mg/dL — AB
RBC / HPF: >50 RBC/hpf — ABNORMAL HIGH (ref 0–5)
Specific Gravity, Urine: 1.017 (ref 1.005–1.030)
pH: 5.5 (ref 5.0–8.0)

## 2021-07-31 LAB — PROTIME-INR
INR: 1 (ref 0.8–1.2)
Prothrombin Time: 13.2 seconds (ref 11.4–15.2)

## 2021-07-31 LAB — COMPREHENSIVE METABOLIC PANEL
ALT: 28 U/L (ref 0–44)
AST: 16 U/L (ref 15–41)
Albumin: 4.2 g/dL (ref 3.5–5.0)
Alkaline Phosphatase: 94 U/L (ref 38–126)
Anion gap: 13 (ref 5–15)
BUN: 10 mg/dL (ref 8–23)
CO2: 25 mmol/L (ref 22–32)
Calcium: 9.4 mg/dL (ref 8.9–10.3)
Chloride: 97 mmol/L — ABNORMAL LOW (ref 98–111)
Creatinine, Ser: 0.77 mg/dL (ref 0.44–1.00)
GFR, Estimated: >60 mL/min (ref >60)
Glucose, Bld: 204 mg/dL — ABNORMAL HIGH (ref 70–99)
Potassium: 3.7 mmol/L (ref 3.5–5.1)
Sodium: 135 mmol/L (ref 135–145)
Total Bilirubin: 0.9 mg/dL (ref 0.3–1.2)
Total Protein: 7.1 g/dL (ref 6.5–8.1)

## 2021-07-31 LAB — RESP PANEL BY RT-PCR (FLU A&B, COVID) ARPGX2
Influenza A by PCR: NEGATIVE
Influenza B by PCR: NEGATIVE
SARS Coronavirus 2 by RT PCR: NEGATIVE

## 2021-07-31 LAB — LACTIC ACID, PLASMA: Lactic Acid, Venous: 1.3 mmol/L (ref 0.5–1.9)

## 2021-07-31 LAB — APTT: aPTT: 26 seconds (ref 24–36)

## 2021-07-31 MED ORDER — SODIUM CHLORIDE 0.9 % IV BOLUS (SEPSIS)
1000.0000 mL | Freq: Once | INTRAVENOUS | Status: AC
  Administered 2021-07-31: 1000 mL via INTRAVENOUS

## 2021-07-31 MED ORDER — ONDANSETRON HCL 4 MG/2ML IJ SOLN
4.0000 mg | Freq: Once | INTRAMUSCULAR | Status: AC
  Administered 2021-07-31: 4 mg via INTRAVENOUS
  Filled 2021-07-31: qty 2

## 2021-07-31 MED ORDER — SODIUM CHLORIDE 0.9 % IV SOLN
2.0000 g | Freq: Once | INTRAVENOUS | Status: AC
  Administered 2021-07-31: 2 g via INTRAVENOUS
  Filled 2021-07-31: qty 2

## 2021-07-31 MED ORDER — HYDROMORPHONE HCL 1 MG/ML IJ SOLN
1.0000 mg | Freq: Once | INTRAMUSCULAR | Status: AC
  Administered 2021-07-31: 1 mg via INTRAVENOUS
  Filled 2021-07-31: qty 1

## 2021-07-31 MED ORDER — ACETAMINOPHEN 325 MG PO TABS
650.0000 mg | ORAL_TABLET | Freq: Once | ORAL | Status: AC
  Administered 2021-07-31: 650 mg via ORAL
  Filled 2021-07-31: qty 2

## 2021-07-31 MED ORDER — SODIUM CHLORIDE 0.9 % IV SOLN
2.0000 g | Freq: Three times a day (TID) | INTRAVENOUS | Status: DC
  Administered 2021-08-01 – 2021-08-03 (×7): 2 g via INTRAVENOUS
  Filled 2021-07-31 (×7): qty 2

## 2021-07-31 NOTE — ED Notes (Signed)
Pt's husband states wife was not "feeling right" In to assess pt. Vitals rechecked and stable. Pt's skin color pale and diaphoretic. MD and primary RN aware.

## 2021-07-31 NOTE — ED Provider Notes (Signed)
Nursing notes and vitals signs, including pulse oximetry, reviewed.  Summary of this visit's results, reviewed by myself:  EKG:  EKG Interpretation  Date/Time:  Saturday July 31 2021 21:16:51 EST Ventricular Rate:  116 PR Interval:  182 QRS Duration: 88 QT Interval:  295 QTC Calculation: 410 R Axis:   -77 Text Interpretation: Sinus tachycardia Confirmed by Lennice Sites ([id number]) on 07/31/2021 9:20:29 PM        Labs:  Results for orders placed or performed during the hospital encounter of 07/31/21 (from the past 24 hour(s))  Resp Panel by RT-PCR (Flu A&B, Covid) Nasopharyngeal Swab     Status: None   Collection Time: 07/31/21  9:05 PM   Specimen: Nasopharyngeal Swab; Nasopharyngeal(NP) swabs in vial transport medium  Result Value Ref Range   SARS Coronavirus 2 by RT PCR NEGATIVE NEGATIVE   Influenza A by PCR NEGATIVE NEGATIVE   Influenza B by PCR NEGATIVE NEGATIVE  Lactic acid, plasma     Status: None   Collection Time: 07/31/21  9:34 PM  Result Value Ref Range   Lactic Acid, Venous 1.3 0.5 - 1.9 mmol/L  Comprehensive metabolic panel     Status: Abnormal   Collection Time: 07/31/21  9:34 PM  Result Value Ref Range   Sodium 135 135 - 145 mmol/L   Potassium 3.7 3.5 - 5.1 mmol/L   Chloride 97 (L) 98 - 111 mmol/L   CO2 25 22 - 32 mmol/L   Glucose, Bld 204 (H) 70 - 99 mg/dL   BUN 10 8 - 23 mg/dL   Creatinine, Ser 0.77 0.44 - 1.00 mg/dL   Calcium 9.4 8.9 - 10.3 mg/dL   Total Protein 7.1 6.5 - 8.1 g/dL   Albumin 4.2 3.5 - 5.0 g/dL   AST 16 15 - 41 U/L   ALT 28 0 - 44 U/L   Alkaline Phosphatase 94 38 - 126 U/L   Total Bilirubin 0.9 0.3 - 1.2 mg/dL   GFR, Estimated >60 >60 mL/min   Anion gap 13 5 - 15  CBC WITH DIFFERENTIAL     Status: Abnormal   Collection Time: 07/31/21  9:34 PM  Result Value Ref Range   WBC 11.5 (H) 4.0 - 10.5 K/uL   RBC 4.48 3.87 - 5.11 MIL/uL   Hemoglobin 12.7 12.0 - 15.0 g/dL   HCT 38.9 36.0 - 46.0 %   MCV 86.8 80.0 - 100.0 fL   MCH 28.3 26.0  - 34.0 pg   MCHC 32.6 30.0 - 36.0 g/dL   RDW 13.2 11.5 - 15.5 %   Platelets 228 150 - 400 K/uL   nRBC 0.0 0.0 - 0.2 %   Neutrophils Relative % 78 %   Neutro Abs 9.0 (H) 1.7 - 7.7 K/uL   Lymphocytes Relative 11 %   Lymphs Abs 1.2 0.7 - 4.0 K/uL   Monocytes Relative 10 %   Monocytes Absolute 1.1 (H) 0.1 - 1.0 K/uL   Eosinophils Relative 0 %   Eosinophils Absolute 0.1 0.0 - 0.5 K/uL   Basophils Relative 0 %   Basophils Absolute 0.0 0.0 - 0.1 K/uL   Immature Granulocytes 1 %   Abs Immature Granulocytes 0.08 (H) 0.00 - 0.07 K/uL  Protime-INR     Status: None   Collection Time: 07/31/21  9:34 PM  Result Value Ref Range   Prothrombin Time 13.2 11.4 - 15.2 seconds   INR 1.0 0.8 - 1.2  APTT     Status: None   Collection Time: 07/31/21  9:34 PM  Result Value Ref Range   aPTT 26 24 - 36 seconds  Urinalysis, Routine w reflex microscopic Urine, Clean Catch     Status: Abnormal   Collection Time: 07/31/21 11:20 PM  Result Value Ref Range   Color, Urine YELLOW YELLOW   APPearance HAZY (A) CLEAR   Specific Gravity, Urine 1.017 1.005 - 1.030   pH 5.5 5.0 - 8.0   Glucose, UA NEGATIVE NEGATIVE mg/dL   Hgb urine dipstick LARGE (A) NEGATIVE   Bilirubin Urine NEGATIVE NEGATIVE   Ketones, ur 15 (A) NEGATIVE mg/dL   Protein, ur 30 (A) NEGATIVE mg/dL   Nitrite NEGATIVE NEGATIVE   Leukocytes,Ua MODERATE (A) NEGATIVE   RBC / HPF >50 (H) 0 - 5 RBC/hpf   WBC, UA 11-20 0 - 5 WBC/hpf   Bacteria, UA RARE (A) NONE SEEN   Squamous Epithelial / LPF 0-5 0 - 5   Mucus PRESENT     Imaging Studies: DG Chest Port 1 View  Result Date: 07/31/2021 CLINICAL DATA:  Questionable sepsis. EXAM: PORTABLE CHEST 1 VIEW COMPARISON:  Chest radiograph dated 03/16/2020. FINDINGS: Cardiomegaly with vascular congestion. No focal consolidation, pleural effusion, pneumothorax. Atherosclerotic calcification of the aorta. Degenerative changes of the spine. No acute osseous pathology. IMPRESSION: Cardiomegaly with vascular  congestion. No focal consolidation. Electronically Signed   By: Anner Crete M.D.   On: 07/31/2021 22:25   CT Renal Stone Study  Result Date: 07/31/2021 CLINICAL DATA:  Fever, status post lithotripsy EXAM: CT ABDOMEN AND PELVIS WITHOUT CONTRAST TECHNIQUE: Multidetector CT imaging of the abdomen and pelvis was performed following the standard protocol without IV contrast. RADIATION DOSE REDUCTION: This exam was performed according to the departmental dose-optimization program which includes automated exposure control, adjustment of the mA and/or kV according to patient size and/or use of iterative reconstruction technique. COMPARISON:  07/01/2021 FINDINGS: Lower chest: Lung bases are clear. Hepatobiliary: Moderate hepatic steatosis. Status post cholecystectomy. No intrahepatic or extrahepatic ductal dilatation. Pancreas: Within normal limits. Spleen: Within normal limits. Adrenals/Urinary Tract: Adrenal glands are within normal limits. Left kidney is notable for a 7 mm nonobstructing left upper pole renal calculus. Right kidney is notable for a 16 mm upper pole cyst (series 2/image 37) and a 6 mm non-obstructing right lower pole renal calculus (series 2/image 41). Right double-pigtail ureteral stent in satisfactory position. No hydronephrosis. Perinephric fluid/stranding is improved when compared to the prior. Low lying bladder with cystocele at rest (sagittal image 83). Stomach/Bowel: Stomach is within normal limits. No evidence of bowel obstruction. Normal appendix (series 2/image 51). No colonic wall thickening or inflammatory changes. Vascular/Lymphatic: No evidence of abdominal aortic aneurysm. Atherosclerotic calcifications of the abdominal aorta and branch vessels. No suspicious abdominopelvic lymphadenopathy. Reproductive: Status post hysterectomy. No adnexal masses. Other: No abdominopelvic ascites. Musculoskeletal: Mild degenerative changes of the lower thoracic spine. IMPRESSION: Bilateral  nonobstructing calculi, measuring up to 7 mm on the left. Right double-pigtail ureteral stent in satisfactory position. No hydronephrosis. Low-lying bladder with cystocele at rest. Moderate hepatic steatosis. Electronically Signed   By: Julian Hy M.D.   On: 07/31/2021 23:01    12:01 AM Discussed with Dr. Diona Fanti of urology.  He would like the patient admitted to the hospitalist service for IV antibiotics.  He states she had what was clinically pyelonephritis in the past but had negative urine cultures and he is concerned she may be heading down a similar path.  Urine and blood cultures are pending.  She has already been given cefepime 2  g IV.  12:19 AM Dr. Myna Hidalgo to admit to hospitalist service.   Shantal Roan, Jenny Reichmann, MD 08/01/21 [phone]

## 2021-07-31 NOTE — ED Triage Notes (Addendum)
Patient here POV from Home with Fever  Patient states she was recently discharged a few days PTA after Lithotripsy.   Patient called Urologist today and prescribed Amoxicillin and instructed to seek ED Evaluation if Patient had a Fever above 102.0.   Fever at 102.5 at Home. Also endorses Pain to Right Flank  NAD Noted during Triage. A&Ox4. GCS 15. BIB Personal Wheelchair.

## 2021-07-31 NOTE — Sepsis Progress Note (Signed)
[facility] following for Sepsis Protocol 

## 2021-07-31 NOTE — ED Provider Notes (Signed)
[place] EMERGENCY DEPT Provider Note   CSN: [id number] Arrival date & time: 07/31/21  2053     History  Chief Complaint  Patient presents with   Fever    Jaime Nichols is a 61 y.o. female.  Here with fever and right flank pain.  Had a infected kidney stone treated several weeks ago.  Had lithotripsy done several days ago by states that 1 portion of the stone could not be removed.  She still has a stent in.  Pain had been doing well until today when she also developed fever.  Denies any cough or sputum production.  The history is provided by the patient.  Fever Max temp prior to arrival:  102 Temp source:  Oral Severity:  Moderate Onset quality:  Gradual Duration:  1 day Timing:  Constant Progression:  Unchanged Chronicity:  New Relieved by:  Nothing Associated symptoms: no chest pain, no chills, no confusion, no congestion, no cough, no diarrhea, no dysuria, no ear pain, no headaches, no myalgias, no nausea, no rash, no rhinorrhea, no somnolence, no sore throat and no vomiting   Risk factors: recent sickness       Home Medications Prior to Admission medications   Medication Sig Start Date End Date Taking? Authorizing Provider  acetaminophen (TYLENOL) 500 MG tablet Take 500-1,000 mg by mouth every 6 (six) hours as needed for headache (or pain).    [provider]  allopurinol (ZYLOPRIM) 100 MG tablet Take 300 mg by mouth in the morning.    [provider]  colchicine 0.6 MG tablet Take 0.6 mg by mouth daily as needed.    [provider]  diltiazem (CARDIZEM CD) 120 MG 24 hr capsule TAKE 1 CAPSULE BY MOUTH EVERY DAY Patient taking differently: Take 120 mg by mouth daily. 05/04/21   Sueanne Margarita, MD  Dulaglutide (TRULICITY) 3 ID/7.8EU SOPN Inject 4.5 mg into the skin once a week. 05/10/21   [provider]  estradiol (ESTRACE) 0.1 MG/GM vaginal cream Place 1 Applicatorful vaginally every 14 (fourteen) days.  11/24/20   [provider]  Evolocumab (REPATHA SURECLICK) 235 MG/ML SOAJ Inject 1 pen into the skin every 14 (fourteen) days. 05/04/21   Sueanne Margarita, MD  HYDROcodone-acetaminophen (NORCO/VICODIN) 5-325 MG tablet Take 1-2 tablets by mouth every 6 (six) hours as needed for moderate pain or severe pain. 07/03/21   Little Ishikawa, MD  levalbuterol [facility] HFA) 45 MCG/ACT inhaler Inhale 1-2 puffs into the lungs every 4 (four) hours as needed for shortness of breath. 09/27/18   [provider]  lisinopril (ZESTRIL) 10 MG tablet Take 1 tablet (10 mg total) by mouth daily. 05/04/21   Sueanne Margarita, MD  metFORMIN (GLUCOPHAGE-XR) 500 MG 24 hr tablet TAKE 2 TABLETS BY MOUTH TWICE A DAY WITH MEALS Patient taking differently: Take 500 mg by mouth daily with breakfast. 09/25/17   Renato Shin, MD  metoprolol succinate (TOPROL-XL) 25 MG 24 hr tablet Take 1 tablet (25 mg total) by mouth daily. 05/04/21   Sueanne Margarita, MD  phenazopyridine (PYRIDIUM) 200 MG tablet Take 1 tablet (200 mg total) by mouth 3 (three) times daily as needed for pain. 07/28/21   Ardis Hughs, MD  Potassium Citrate 15 MEQ (1620 MG) TBCR Take 10 mEq by mouth daily. 06/04/21   [provider]  PRESCRIPTION MEDICATION CPAP- At bedtime    [provider]  tiZANidine (ZANAFLEX) 4 MG tablet Take 0.5 tablets (2 mg total) by mouth  every 6 (six) hours as needed for muscle spasms. 07/28/21   Ardis Hughs, MD      Allergies    Erythromycin, Latex, Nickel, Statins, Crestor [rosuvastatin], Metformin hcl, Potassium citrate, and Zocor [simvastatin]    Review of Systems   Review of Systems  Constitutional:  Positive for fever. Negative for chills.  HENT:  Negative for congestion, ear pain, rhinorrhea and sore throat.   Respiratory:  Negative for cough.   Cardiovascular:  Negative for chest pain.  Gastrointestinal:  Negative for diarrhea, nausea and vomiting.  Genitourinary:  Negative for dysuria.   Musculoskeletal:  Negative for myalgias.  Skin:  Negative for rash.  Neurological:  Negative for headaches.  Psychiatric/Behavioral:  Negative for confusion.    Physical Exam Updated Vital Signs BP (!) 130/59    Pulse 94    Temp 100.2 F (37.9 C) (Oral)    Resp 14    Ht 5\' 3"  (1.6 m)    Wt 119.3 kg    SpO2 93%    BMI 46.59 kg/m  Physical Exam Vitals and nursing note reviewed.  Constitutional:      General: She is not in acute distress.    Appearance: She is well-developed. She is not ill-appearing.  HENT:     Head: Normocephalic and atraumatic.     Nose: Nose normal.     Mouth/Throat:     Mouth: Mucous membranes are moist.  Eyes:     Extraocular Movements: Extraocular movements intact.     Conjunctiva/sclera: Conjunctivae normal.     Pupils: Pupils are equal, round, and reactive to light.  Cardiovascular:     Rate and Rhythm: Regular rhythm. Tachycardia present.     Heart sounds: No murmur heard. Pulmonary:     Effort: Pulmonary effort is normal. No respiratory distress.     Breath sounds: Normal breath sounds.  Abdominal:     Palpations: Abdomen is soft.     Tenderness: There is no abdominal tenderness. There is right CVA tenderness.  Musculoskeletal:        General: No swelling. Normal range of motion.     Cervical back: Neck supple.  Skin:    General: Skin is warm and dry.     Capillary Refill: Capillary refill takes less than 2 seconds.  Neurological:     General: No focal deficit present.     Mental Status: She is alert.  Psychiatric:        Mood and Affect: Mood normal.    ED Results / Procedures / Treatments   Labs (all labs ordered are listed, but only abnormal results are displayed) Labs Reviewed  COMPREHENSIVE METABOLIC PANEL - Abnormal; Notable for the following components:      Result Value   Chloride 97 (*)    Glucose, Bld 204 (*)    All other components within normal limits  CBC WITH DIFFERENTIAL/PLATELET - Abnormal; Notable for the following  components:   WBC 11.5 (*)    Neutro Abs 9.0 (*)    Monocytes Absolute 1.1 (*)    Abs Immature Granulocytes 0.08 (*)    All other components within normal limits  RESP PANEL BY RT-PCR (FLU A&B, COVID) ARPGX2  CULTURE, BLOOD (ROUTINE X 2)  CULTURE, BLOOD (ROUTINE X 2)  URINE CULTURE  LACTIC ACID, PLASMA  PROTIME-INR  APTT  LACTIC ACID, PLASMA  URINALYSIS, ROUTINE W REFLEX MICROSCOPIC    EKG EKG Interpretation  Date/Time:  Saturday July 31 2021 21:16:51 EST Ventricular Rate:  116 PR  Interval:  182 QRS Duration: 88 QT Interval:  295 QTC Calculation: 410 R Axis:   -77 Text Interpretation: Sinus tachycardia Confirmed by Lennice Sites ([id number]) on 07/31/2021 9:20:29 PM  Radiology DG Chest Port 1 View  Result Date: 07/31/2021 CLINICAL DATA:  Questionable sepsis. EXAM: PORTABLE CHEST 1 VIEW COMPARISON:  Chest radiograph dated 03/16/2020. FINDINGS: Cardiomegaly with vascular congestion. No focal consolidation, pleural effusion, pneumothorax. Atherosclerotic calcification of the aorta. Degenerative changes of the spine. No acute osseous pathology. IMPRESSION: Cardiomegaly with vascular congestion. No focal consolidation. Electronically Signed   By: Anner Crete M.D.   On: 07/31/2021 22:25   CT Renal Stone Study  Result Date: 07/31/2021 CLINICAL DATA:  Fever, status post lithotripsy EXAM: CT ABDOMEN AND PELVIS WITHOUT CONTRAST TECHNIQUE: Multidetector CT imaging of the abdomen and pelvis was performed following the standard protocol without IV contrast. RADIATION DOSE REDUCTION: This exam was performed according to the departmental dose-optimization program which includes automated exposure control, adjustment of the mA and/or kV according to patient size and/or use of iterative reconstruction technique. COMPARISON:  07/01/2021 FINDINGS: Lower chest: Lung bases are clear. Hepatobiliary: Moderate hepatic steatosis. Status post cholecystectomy. No intrahepatic or extrahepatic ductal  dilatation. Pancreas: Within normal limits. Spleen: Within normal limits. Adrenals/Urinary Tract: Adrenal glands are within normal limits. Left kidney is notable for a 7 mm nonobstructing left upper pole renal calculus. Right kidney is notable for a 16 mm upper pole cyst (series 2/image 37) and a 6 mm non-obstructing right lower pole renal calculus (series 2/image 41). Right double-pigtail ureteral stent in satisfactory position. No hydronephrosis. Perinephric fluid/stranding is improved when compared to the prior. Low lying bladder with cystocele at rest (sagittal image 83). Stomach/Bowel: Stomach is within normal limits. No evidence of bowel obstruction. Normal appendix (series 2/image 51). No colonic wall thickening or inflammatory changes. Vascular/Lymphatic: No evidence of abdominal aortic aneurysm. Atherosclerotic calcifications of the abdominal aorta and branch vessels. No suspicious abdominopelvic lymphadenopathy. Reproductive: Status post hysterectomy. No adnexal masses. Other: No abdominopelvic ascites. Musculoskeletal: Mild degenerative changes of the lower thoracic spine. IMPRESSION: Bilateral nonobstructing calculi, measuring up to 7 mm on the left. Right double-pigtail ureteral stent in satisfactory position. No hydronephrosis. Low-lying bladder with cystocele at rest. Moderate hepatic steatosis. Electronically Signed   By: Julian Hy M.D.   On: 07/31/2021 23:01    Procedures .Critical Care Performed by: Lennice Sites, DO Authorized by: Lennice Sites, DO   Critical care provider statement:    Critical care time (minutes):  35   Critical care was necessary to treat or prevent imminent or life-threatening deterioration of the following conditions:  Sepsis   Critical care was time spent personally by me on the following activities:  Blood draw for specimens, development of treatment plan with patient or surrogate, discussions with consultants, discussions with primary provider,  evaluation of patient's response to treatment, examination of patient, obtaining history from patient or surrogate, ordering and performing treatments and interventions, ordering and review of laboratory studies, ordering and review of radiographic studies, pulse oximetry, re-evaluation of patient's condition and review of old charts   Care discussed with: admitting provider      Medications Ordered in ED Medications  ceFEPIme (MAXIPIME) 2 g in sodium chloride 0.9 % 100 mL IVPB (0 g Intravenous Stopped 07/31/21 2230)  sodium chloride 0.9 % bolus 1,000 mL (1,000 mLs Intravenous New Bag/Given 07/31/21 2146)  acetaminophen (TYLENOL) tablet 650 mg (650 mg Oral Given 07/31/21 2153)  ondansetron (ZOFRAN) injection 4 mg (4 mg Intravenous Given  07/31/21 2146)  HYDROmorphone (DILAUDID) injection 1 mg (1 mg Intravenous Given 07/31/21 2158)    ED Course/ Medical Decision Making/ A&P                           Medical Decision Making Amount and/or Complexity of Data Reviewed Labs: ordered. Radiology: ordered. ECG/medicine tests: ordered.  Risk OTC drugs. Prescription drug management.   Hiliana Eilts Wogan is here with right flank pain, fever, chills.  Patient febrile and tachycardic upon arrival.  Comorbidities include history of kidney stones with current ureteral stent and retained fragment from prior kidney stone from lithotripsy done several days ago.  Patient with history of high cholesterol, hypertension, diabetes.  Arrives febrile with ongoing right flank pain that restarted again yesterday.  Sepsis protocol initiated.  She is tender in the right flank.  She has no other symptoms.  My suspicion is for likely septic stone but will pursue sepsis work-up with chest x-ray, urine studies, CT scan of abdomen and pelvis, basic labs otherwise including CBC, lactic acid.  Broad-spectrum IV antibiotics to be given with cefepime.  Will give fluid bolus and Tylenol as well.  Lab work has been reviewed and  interpreted.  Patient with normal lactic acid but does have mild leukocytosis.  COVID and flu test is negative.  Chest x-ray per my interpretation shows no pneumonia.  Urinalysis is pending.  CT scan per radiology read shows bilateral nonobstructive kidney stones.  Right double-pigtail ureteral stent is in satisfactory position, there is no hydronephrosis.  There is no obstructive kidney stone.  Overall suspect fever secondary to kidney infection/UTI.  Specifically high risk for infection given recent instrumentation and stent.  Anticipate admission to medicine for IV antibiotics and further sepsis care.  Patient handed off to oncoming ED staff with patient pending urinalysis.  This chart was dictated using voice recognition software.  Despite best efforts to proofread,  errors can occur which can change the documentation meaning.         Final Clinical Impression(s) / ED Diagnoses Final diagnoses:  Sepsis, due to unspecified organism, unspecified whether acute organ dysfunction present [facility])    Rx / DC Orders ED Discharge Orders     None         Lennice Sites, DO 07/31/21 2323

## 2021-07-31 NOTE — Progress Notes (Signed)
Pharmacy Antibiotic Note  VEENA STURGESS is a 61 y.o. female admitted on 07/31/2021 with sepsis and UTI.  Pharmacy has been consulted for Cefepime dosing. WBC mildly elevated. Renal function good.   Plan: Cefepime 2g IV q8h Trend WBC, temp, renal function  F/U infectious work-up   Temp (24hrs), Avg:101 F (38.3 C), Min:100.2 F (37.9 C), Max:101.7 F (38.7 C)  Recent Labs  Lab 07/31/21 2134  WBC 11.5*  CREATININE 0.77  LATICACIDVEN 1.3    Estimated Creatinine Clearance: 91.2 mL/min (by C-G formula based on SCr of 0.77 mg/dL).    Allergies  Allergen Reactions   Erythromycin Diarrhea and Nausea And Vomiting    Other reaction(s): stomach upset   Latex Other (See Comments)    REACTION: "CONTACT DERMATITIS"   Nickel Other (See Comments)    Poor healing- dental reasons   Statins Other (See Comments)    Joint pain   Crestor [Rosuvastatin] Other (See Comments)    Cannot take when taking Colchicine   Metformin Hcl Diarrhea and Other (See Comments)    Diarrhea at higher doses, GI Upset   Potassium Citrate Other (See Comments)    Affected eyes- "redness and crusty" at 30 mEq/daily; pt states she has no reaction to the potassium citrate that she is taking now and that this only happened when she was taking too much   Zocor [Simvastatin] Other (See Comments)    Joint pain   Narda Bonds, PharmD, [place] Pharmacist Phone: [phone]

## 2021-08-01 DIAGNOSIS — E785 Hyperlipidemia, unspecified: Secondary | ICD-10-CM | POA: Diagnosis present

## 2021-08-01 DIAGNOSIS — Z20822 Contact with and (suspected) exposure to covid-19: Secondary | ICD-10-CM | POA: Diagnosis present

## 2021-08-01 DIAGNOSIS — Z9889 Other specified postprocedural states: Secondary | ICD-10-CM

## 2021-08-01 DIAGNOSIS — I5032 Chronic diastolic (congestive) heart failure: Secondary | ICD-10-CM | POA: Diagnosis present

## 2021-08-01 DIAGNOSIS — Z818 Family history of other mental and behavioral disorders: Secondary | ICD-10-CM | POA: Diagnosis not present

## 2021-08-01 DIAGNOSIS — I493 Ventricular premature depolarization: Secondary | ICD-10-CM | POA: Diagnosis present

## 2021-08-01 DIAGNOSIS — Z79899 Other long term (current) drug therapy: Secondary | ICD-10-CM | POA: Diagnosis not present

## 2021-08-01 DIAGNOSIS — E1165 Type 2 diabetes mellitus with hyperglycemia: Secondary | ICD-10-CM | POA: Diagnosis present

## 2021-08-01 DIAGNOSIS — Z6841 Body Mass Index (BMI) 40.0 and over, adult: Secondary | ICD-10-CM | POA: Diagnosis not present

## 2021-08-01 DIAGNOSIS — Z7984 Long term (current) use of oral hypoglycemic drugs: Secondary | ICD-10-CM | POA: Diagnosis not present

## 2021-08-01 DIAGNOSIS — I2510 Atherosclerotic heart disease of native coronary artery without angina pectoris: Secondary | ICD-10-CM | POA: Diagnosis present

## 2021-08-01 DIAGNOSIS — I471 Supraventricular tachycardia: Secondary | ICD-10-CM | POA: Diagnosis present

## 2021-08-01 DIAGNOSIS — Z833 Family history of diabetes mellitus: Secondary | ICD-10-CM | POA: Diagnosis not present

## 2021-08-01 DIAGNOSIS — Z8619 Personal history of other infectious and parasitic diseases: Secondary | ICD-10-CM | POA: Diagnosis not present

## 2021-08-01 DIAGNOSIS — Z881 Allergy status to other antibiotic agents status: Secondary | ICD-10-CM | POA: Diagnosis not present

## 2021-08-01 DIAGNOSIS — Z91040 Latex allergy status: Secondary | ICD-10-CM | POA: Diagnosis not present

## 2021-08-01 DIAGNOSIS — I110 Hypertensive heart disease with heart failure: Secondary | ICD-10-CM | POA: Diagnosis present

## 2021-08-01 DIAGNOSIS — B3749 Other urogenital candidiasis: Secondary | ICD-10-CM | POA: Diagnosis present

## 2021-08-01 DIAGNOSIS — N390 Urinary tract infection, site not specified: Secondary | ICD-10-CM | POA: Diagnosis present

## 2021-08-01 DIAGNOSIS — Z87442 Personal history of urinary calculi: Secondary | ICD-10-CM | POA: Diagnosis not present

## 2021-08-01 DIAGNOSIS — Z960 Presence of urogenital implants: Secondary | ICD-10-CM | POA: Diagnosis present

## 2021-08-01 DIAGNOSIS — Z8249 Family history of ischemic heart disease and other diseases of the circulatory system: Secondary | ICD-10-CM | POA: Diagnosis not present

## 2021-08-01 DIAGNOSIS — K760 Fatty (change of) liver, not elsewhere classified: Secondary | ICD-10-CM | POA: Diagnosis present

## 2021-08-01 DIAGNOSIS — G4733 Obstructive sleep apnea (adult) (pediatric): Secondary | ICD-10-CM | POA: Diagnosis present

## 2021-08-01 DIAGNOSIS — Z888 Allergy status to other drugs, medicaments and biological substances status: Secondary | ICD-10-CM | POA: Diagnosis not present

## 2021-08-01 LAB — BASIC METABOLIC PANEL
Anion gap: 9 (ref 5–15)
BUN: 10 mg/dL (ref 8–23)
CO2: 27 mmol/L (ref 22–32)
Calcium: 9 mg/dL (ref 8.9–10.3)
Chloride: 103 mmol/L (ref 98–111)
Creatinine, Ser: 0.67 mg/dL (ref 0.44–1.00)
GFR, Estimated: >60 mL/min (ref >60)
Glucose, Bld: 141 mg/dL — ABNORMAL HIGH (ref 70–99)
Potassium: 3.7 mmol/L (ref 3.5–5.1)
Sodium: 139 mmol/L (ref 135–145)

## 2021-08-01 LAB — CBC WITH DIFFERENTIAL/PLATELET
Abs Immature Granulocytes: 0.06 10*3/uL (ref 0.00–0.07)
Basophils Absolute: 0 10*3/uL (ref 0.0–0.1)
Basophils Relative: 0 %
Eosinophils Absolute: 0.1 10*3/uL (ref 0.0–0.5)
Eosinophils Relative: 1 %
HCT: 36.1 % (ref 36.0–46.0)
Hemoglobin: 11.8 g/dL — ABNORMAL LOW (ref 12.0–15.0)
Immature Granulocytes: 1 %
Lymphocytes Relative: 20 %
Lymphs Abs: 2 10*3/uL (ref 0.7–4.0)
MCH: 28.8 pg (ref 26.0–34.0)
MCHC: 32.7 g/dL (ref 30.0–36.0)
MCV: 88 fL (ref 80.0–100.0)
Monocytes Absolute: 1 10*3/uL (ref 0.1–1.0)
Monocytes Relative: 10 %
Neutro Abs: 6.7 10*3/uL (ref 1.7–7.7)
Neutrophils Relative %: 68 %
Platelets: 201 10*3/uL (ref 150–400)
RBC: 4.1 MIL/uL (ref 3.87–5.11)
RDW: 13.4 % (ref 11.5–15.5)
WBC: 9.8 10*3/uL (ref 4.0–10.5)
nRBC: 0 % (ref 0.0–0.2)

## 2021-08-01 LAB — GLUCOSE, CAPILLARY: Glucose-Capillary: 209 mg/dL — ABNORMAL HIGH (ref 70–99)

## 2021-08-01 MED ORDER — ACETAMINOPHEN 325 MG PO TABS: 650.0000 mg | ORAL_TABLET | Freq: Four times a day (QID) | ORAL | Status: DC | PRN

## 2021-08-01 MED ORDER — POLYETHYLENE GLYCOL 3350 17 G PO PACK: 17.0000 g | PACK | Freq: Every day | ORAL | Status: DC | PRN

## 2021-08-01 MED ORDER — ALLOPURINOL 300 MG PO TABS
300.0000 mg | ORAL_TABLET | Freq: Every morning | ORAL | Status: DC
  Administered 2021-08-01 – 2021-08-03 (×3): 300 mg via ORAL
  Filled 2021-08-01 (×2): qty 1
  Filled 2021-08-01: qty 3

## 2021-08-01 MED ORDER — INSULIN ASPART 100 UNIT/ML IJ SOLN
0.0000 [IU] | Freq: Every day | INTRAMUSCULAR | Status: DC
  Administered 2021-08-01: 2 [IU] via SUBCUTANEOUS

## 2021-08-01 MED ORDER — ACETAMINOPHEN 650 MG RE SUPP: 650.0000 mg | Freq: Four times a day (QID) | RECTAL | Status: DC | PRN

## 2021-08-01 MED ORDER — LEVALBUTEROL HCL 0.63 MG/3ML IN NEBU: 0.6300 mg | INHALATION_SOLUTION | RESPIRATORY_TRACT | Status: DC | PRN

## 2021-08-01 MED ORDER — HYDROCODONE-ACETAMINOPHEN 5-325 MG PO TABS: 1.0000 | ORAL_TABLET | Freq: Four times a day (QID) | ORAL | Status: DC | PRN

## 2021-08-01 MED ORDER — METFORMIN HCL ER 500 MG PO TB24: 500.0000 mg | ORAL_TABLET | Freq: Every day | ORAL | Status: DC

## 2021-08-01 MED ORDER — LISINOPRIL 10 MG PO TABS
10.0000 mg | ORAL_TABLET | Freq: Every day | ORAL | Status: DC
  Administered 2021-08-01: 10 mg via ORAL
  Filled 2021-08-01: qty 1

## 2021-08-01 MED ORDER — TIZANIDINE HCL 4 MG PO TABS
2.0000 mg | ORAL_TABLET | Freq: Four times a day (QID) | ORAL | Status: DC | PRN
  Administered 2021-08-02 (×2): 2 mg via ORAL
  Filled 2021-08-01 (×3): qty 1

## 2021-08-01 MED ORDER — PHENAZOPYRIDINE HCL 200 MG PO TABS
200.0000 mg | ORAL_TABLET | Freq: Three times a day (TID) | ORAL | Status: DC | PRN
  Filled 2021-08-01: qty 1

## 2021-08-01 MED ORDER — DILTIAZEM HCL ER COATED BEADS 120 MG PO CP24
120.0000 mg | ORAL_CAPSULE | Freq: Every day | ORAL | Status: DC
  Administered 2021-08-01 – 2021-08-03 (×3): 120 mg via ORAL
  Filled 2021-08-01 (×3): qty 1

## 2021-08-01 MED ORDER — BISACODYL 10 MG RE SUPP: 10.0000 mg | Freq: Every day | RECTAL | Status: DC | PRN

## 2021-08-01 MED ORDER — METOPROLOL SUCCINATE ER 25 MG PO TB24
25.0000 mg | ORAL_TABLET | Freq: Every day | ORAL | Status: DC
  Administered 2021-08-02 – 2021-08-03 (×2): 25 mg via ORAL
  Filled 2021-08-01 (×2): qty 1

## 2021-08-01 MED ORDER — INSULIN ASPART 100 UNIT/ML IJ SOLN
0.0000 [IU] | Freq: Three times a day (TID) | INTRAMUSCULAR | Status: DC
  Administered 2021-08-02 (×2): 1 [IU] via SUBCUTANEOUS
  Administered 2021-08-02 – 2021-08-03 (×2): 2 [IU] via SUBCUTANEOUS

## 2021-08-01 MED ORDER — ONDANSETRON HCL 4 MG/2ML IJ SOLN: 4.0000 mg | Freq: Four times a day (QID) | INTRAMUSCULAR | Status: DC | PRN

## 2021-08-01 MED ORDER — ONDANSETRON HCL 4 MG PO TABS: 4.0000 mg | ORAL_TABLET | Freq: Four times a day (QID) | ORAL | Status: DC | PRN

## 2021-08-01 MED ORDER — ENOXAPARIN SODIUM 60 MG/0.6ML IJ SOSY
60.0000 mg | PREFILLED_SYRINGE | INTRAMUSCULAR | Status: DC
  Administered 2021-08-01: 60 mg via SUBCUTANEOUS
  Filled 2021-08-01: qty 0.6

## 2021-08-01 NOTE — H&P (Signed)
History and Physical    Patient: Jaime Nichols DQQ:[id number] DOB: 1961-04-24 DOA: 07/31/2021 DOS: the patient was seen and examined on 08/01/2021 PCP: Cari Caraway, MD  Patient coming from: Home  Chief Complaint:  Chief Complaint  Patient presents with   Fever    HPI: Jaime Nichols is a 61 y.o. female with medical history significant of HTN, OSA, CAD, HLD, ureteric stone S/P lithotripsy. Patient was recently hospitalized for sepsis secondary to UTI due to obstructing UPJ stone.  Urology was consulted, patient underwent cystoscopy and insertion of the right JJ stent. On 2/8 underwent cystoscopy and laser lithotripsy as well as stent exchange. On 2/10 started to have fever with nausea.  Patient was called in oral Augmentin by urology which she started taking on 2/11.  Patient started having worsening fever on 2/11 therefore decided to come to the hospital. She also reported some lower abdominal pain as well as intermittent hematuria after the procedure. No vomiting episode reported. No diarrhea reported. No chest pain.  No cough no shortness of breath.  No dizziness or lightheadedness.  Review of Systems: As mentioned in the history of present illness. All other systems reviewed and are negative. Past Medical History:  Diagnosis Date   Aortic atherosclerosis ([place])    CAD in native artery    very high calcium score at 875.  coronary CTA  showed 30% RCA and LAD.   Chronic diastolic CHF (congestive heart failure) ([place]) 2012   Diabetes mellitus type 2 in obese ([place])    Dyspnea    with exertion   Dysrhythmia    PVCs, PAC   Fibroid    History of kidney stones 09/2019   HTN (hypertension)    Hyperlipidemia    Morbid obesity ([facility])    OSA on CPAP    PAT (paroxysmal atrial tachycardia) ([facility])    Premature atrial contractions    PVC's (premature ventricular contractions)    Past Surgical History:  Procedure Laterality Date   ABDOMINAL HYSTERECTOMY  06/20/2008   BSO    Benign uterine polyps  01/18/2009   CYSTOSCOPY W/ URETERAL STENT PLACEMENT Right 07/02/2021   Procedure: CYSTOSCOPY WITH RETROGRADE PYELOGRAM/URETERAL STENT PLACEMENT;  Surgeon: Remi Haggard, MD;  Location: [facility];  Service: Urology;  Laterality: Right;   CYSTOSCOPY/URETEROSCOPY/HOLMIUM LASER/STENT PLACEMENT Right 07/28/2021   Procedure: RIGHT URETEROSCOPY/ RETROGRADE / HOLMIUM LASER/ STONE BASKETRY, STENT EXCHANGE;  Surgeon: Ardis Hughs, MD;  Location[facility];  Service: Urology;  Laterality: Right;   DILATION AND CURETTAGE OF UTERUS  2010   HYSTEROSCOPY  2010   URETEROSCOPY WITH HOLMIUM LASER LITHOTRIPSY Left 2021   Social History:  reports that she has never smoked. She has never used smokeless tobacco. She reports current alcohol use. She reports that she does not use drugs.  Allergies  Allergen Reactions   Erythromycin Diarrhea and Nausea And Vomiting    Other reaction(s): stomach upset   Latex Other (See Comments)    REACTION: "CONTACT DERMATITIS"   Nickel Other (See Comments)    Poor healing- dental reasons   Statins Other (See Comments)    Joint pain   Crestor [Rosuvastatin] Other (See Comments)    Cannot take when taking Colchicine   Metformin Hcl Diarrhea and Other (See Comments)    Diarrhea at higher doses, GI Upset   Potassium Citrate Other (See Comments)    Affected eyes- "redness and crusty" at 30 mEq/daily; pt states she has no reaction to the potassium citrate that she  is taking now and that this only happened when she was taking too much   Zocor [Simvastatin] Other (See Comments)    Joint pain    Family History  Problem Relation Age of Onset   Hypertension Mother    Heart disease Father        Died age 78s of a cardiac event - was told it was atherosclerosis but also had PVCs   Healthy Sister    Arrhythmia Sister        PVCs   Depression Brother    Healthy Sister    Diabetes Paternal Grandmother    Arrhythmia Other         Distant relative with WPW.    Prior to Admission medications   Medication Sig Start Date End Date Taking? Authorizing Provider  acetaminophen (TYLENOL) 500 MG tablet Take 500-1,000 mg by mouth every 6 (six) hours as needed for headache (or pain).   Yes [provider]  allopurinol (ZYLOPRIM) 100 MG tablet Take 300 mg by mouth daily.   Yes [provider]  colchicine 0.6 MG tablet Take 0.6 mg by mouth daily as needed (For gout).   Yes [provider]  diltiazem (CARDIZEM CD) 120 MG 24 hr capsule TAKE 1 CAPSULE BY MOUTH EVERY DAY Patient taking differently: Take 120 mg by mouth daily. 05/04/21  Yes Turner, Eber Hong, MD  estradiol (ESTRACE) 0.1 MG/GM vaginal cream Place 1 Applicatorful vaginally every 14 (fourteen) days. 11/24/20  Yes [provider]  Evolocumab (REPATHA SURECLICK) 026 MG/ML SOAJ Inject 1 pen into the skin every 14 (fourteen) days. 05/04/21  Yes Turner, Eber Hong, MD  levalbuterol (XOPENEX HFA) 45 MCG/ACT inhaler Inhale 1-2 puffs into the lungs every 4 (four) hours as needed for shortness of breath. 09/27/18  Yes [provider]  lisinopril (ZESTRIL) 10 MG tablet Take 1 tablet (10 mg total) by mouth daily. 05/04/21  Yes Turner, Eber Hong, MD  metFORMIN (GLUCOPHAGE-XR) 500 MG 24 hr tablet TAKE 2 TABLETS BY MOUTH TWICE A DAY WITH MEALS Patient taking differently: Take 500 mg by mouth daily with breakfast. 09/25/17  Yes Renato Shin, MD  metoprolol succinate (TOPROL-XL) 25 MG 24 hr tablet Take 1 tablet (25 mg total) by mouth daily. 05/04/21  Yes Turner, Eber Hong, MD  phenazopyridine (PYRIDIUM) 200 MG tablet Take 1 tablet (200 mg total) by mouth 3 (three) times daily as needed for pain. 07/28/21  Yes Ardis Hughs, MD  Potassium Citrate 15 MEQ (1620 MG) TBCR Take 10 mEq by mouth daily. 06/04/21  Yes [provider]  PRESCRIPTION MEDICATION CPAP- At bedtime   Yes [provider]  tamsulosin (FLOMAX) 0.4 MG CAPS capsule Take 0.4 mg  by mouth daily. 07/29/21  Yes [provider]  tiZANidine (ZANAFLEX) 4 MG tablet Take 0.5 tablets (2 mg total) by mouth every 6 (six) hours as needed for muscle spasms. 07/28/21  Yes Ardis Hughs, MD  Dulaglutide (TRULICITY) 3 VZ/8.5YI SOPN Inject 4.5 mg into the skin once a week. 05/10/21   [provider]  HYDROcodone-acetaminophen (NORCO/VICODIN) 5-325 MG tablet Take 1-2 tablets by mouth every 6 (six) hours as needed for moderate pain or severe pain. Patient not taking: Reported on 08/01/2021 07/03/21   Little Ishikawa, MD    Physical Exam: Vitals:   08/01/21 0730 08/01/21 0930 08/01/21 1230 08/01/21 1604  BP: 114/63 137/64 139/70 (!) 149/75  Pulse: 79 78 87 100  Resp: 15 18 15 17   Temp:   98.5  F (36.9 C) 99.8 F (37.7 C)  TempSrc:   Oral Oral  SpO2: 95% 94% 100% 97%  Weight:      Height:       General: Appear in mild distress, no Rash; Oral Mucosa Clear, moist. no Abnormal Neck Mass Or lumps, Conjunctiva normal  Cardiovascular: S1 and S2 Present, no Murmur, Respiratory: good respiratory effort, Bilateral Air entry present and CTA, no Crackles, no wheezes Abdomen: Bowel Sound present, Soft and no tenderness Extremities: no Pedal edema Neurology: alert and oriented to time, place, and person affect appropriate. no new focal deficit Gait not checked due to patient safety concerns   Data Reviewed: I have Reviewed nursing notes, Vitals, and Lab results since pt's last encounter. Pertinent lab results CBC and BMP I have ordered test including CBC and BMP I have independently visualized and interpreted imaging CT renal stone study which showed no constipation. I have reviewed the last note from urology and discussed the plan with them,    Assessment and Plan: * Complicated UTI (urinary tract infection)- (present on admission) Presents with fever. Recently undergone cystoscopy and lithotripsy for right stone removal. Urine cultures last admission were  negative. New blood cultures and urine cultures performed this time. Currently on IV cefepime. We will monitor. Do not think the patient requires IV fluid for now. Urology is aware of patient's presentation.  HTN (hypertension)- (present on admission) Blood pressure is stable. We will continue Toprol-XL. Hold lisinopril. Continue Cardizem.  Uncontrolled type 2 diabetes mellitus with hyperglycemia, without long-term current use of insulin ([place])- (present on admission) Hemoglobin A1c 8.0. Only on metformin and Trulicity outpatient. Currently on sliding scale insulin.  Will monitor.  Chronic diastolic CHF (congestive heart failure) ([place])- (present on admission) Volume status adequate. Did not require diuretics  OSA on CPAP Continue CPAP nightly.  Morbid obesity ([place])- (present on admission) Body mass index is 46.59 kg/m.  Placing the patient at high risk of poor outcome.  Hyperlipidemia- (present on admission) On Repatha outpatient.       Advance Care Planning:   Code Status: Full Code   Consults: Urology  Family Communication: Husband at bedside  Severity of Illness: The appropriate patient status for this patient is INPATIENT. Inpatient status is judged to be reasonable and necessary in order to provide the required intensity of service to ensure the patient's safety. The patient's presenting symptoms, physical exam findings, and initial radiographic and laboratory data in the context of their chronic comorbidities is felt to place them at high risk for further clinical deterioration. Furthermore, it is not anticipated that the patient will be medically stable for discharge from the hospital within 2 midnights of admission.   * I certify that at the point of admission it is my clinical judgment that the patient will require inpatient hospital care spanning beyond 2 midnights from the point of admission due to high intensity of service, high risk for further deterioration  and high frequency of surveillance required.*  Author: Berle Mull, MD 08/01/2021 6:19 PM  For on call review [email or web address].

## 2021-08-01 NOTE — Assessment & Plan Note (Signed)
Volume status adequate. Did not require diuretics

## 2021-08-01 NOTE — Assessment & Plan Note (Addendum)
Body mass index is 46.59 kg/m.  Placing the patient at high risk of poor outcome.

## 2021-08-01 NOTE — ED Notes (Signed)
She is awake, alert and oriented x 4 with clear speech. Her husband also remains with her.

## 2021-08-01 NOTE — Assessment & Plan Note (Signed)
Continue CPAP nightly. °

## 2021-08-01 NOTE — Assessment & Plan Note (Addendum)
Yeast UTI Presents with fever. Recently undergone cystoscopy and lithotripsy for right stone removal. Urine cultures last admission were negative. blood cultures negative for now urine culture growing Yeast.  Was on IV cefepime. Urology consulted. Pt symptomatically better. Now on oral Augmentin and Diflucan. Pt will remove the stent tomorrow at home.

## 2021-08-01 NOTE — Assessment & Plan Note (Addendum)
Hemoglobin A1c 8.0. Only on metformin and Trulicity outpatient.

## 2021-08-01 NOTE — Assessment & Plan Note (Signed)
On [place] outpatient.

## 2021-08-01 NOTE — Assessment & Plan Note (Addendum)
Blood pressure is stable 

## 2021-08-01 NOTE — ED Notes (Signed)
She is comfortable, resting with her home bi-pap. Her husband is with her.

## 2021-08-01 NOTE — ED Notes (Signed)
[facility] called for transport. 

## 2021-08-01 NOTE — ED Notes (Signed)
Dr. Florina Ou in with pt.

## 2021-08-01 NOTE — ED Notes (Signed)
RT assisted pt with home CPAP placement at this time. RT filled pts humidification chamber w/sterile water. Pt home setting is 12.6 cmHg w/no O2 entrained into the system. RT will continue to monitor.

## 2021-08-02 LAB — CBC
HCT: 37.9 % (ref 36.0–46.0)
Hemoglobin: 12.4 g/dL (ref 12.0–15.0)
MCH: 29.5 pg (ref 26.0–34.0)
MCHC: 32.7 g/dL (ref 30.0–36.0)
MCV: 90.2 fL (ref 80.0–100.0)
Platelets: 258 10*3/uL (ref 150–400)
RBC: 4.2 MIL/uL (ref 3.87–5.11)
RDW: 13.3 % (ref 11.5–15.5)
WBC: 11.8 10*3/uL — ABNORMAL HIGH (ref 4.0–10.5)
nRBC: 0 % (ref 0.0–0.2)

## 2021-08-02 LAB — URINE CULTURE: Culture: 70000 — AB

## 2021-08-02 LAB — GLUCOSE, CAPILLARY
Glucose-Capillary: 162 mg/dL — ABNORMAL HIGH (ref 70–99)
Glucose-Capillary: 150 mg/dL — ABNORMAL HIGH (ref 70–99)
Glucose-Capillary: 142 mg/dL — ABNORMAL HIGH (ref 70–99)

## 2021-08-02 LAB — BASIC METABOLIC PANEL
Anion gap: 9 (ref 5–15)
BUN: 11 mg/dL (ref 8–23)
CO2: 26 mmol/L (ref 22–32)
Calcium: 9.2 mg/dL (ref 8.9–10.3)
Chloride: 103 mmol/L (ref 98–111)
Creatinine, Ser: 0.56 mg/dL (ref 0.44–1.00)
GFR, Estimated: >60 mL/min (ref >60)
Glucose, Bld: 160 mg/dL — ABNORMAL HIGH (ref 70–99)
Potassium: 3.8 mmol/L (ref 3.5–5.1)
Sodium: 138 mmol/L (ref 135–145)

## 2021-08-02 MED ORDER — TAMSULOSIN HCL 0.4 MG PO CAPS
0.4000 mg | ORAL_CAPSULE | Freq: Every day | ORAL | Status: DC
  Administered 2021-08-02: 0.4 mg via ORAL
  Filled 2021-08-02: qty 1

## 2021-08-02 MED ORDER — ADULT MULTIVITAMIN W/MINERALS CH
1.0000 | ORAL_TABLET | Freq: Every day | ORAL | Status: DC
  Administered 2021-08-02 – 2021-08-03 (×2): 1 via ORAL
  Filled 2021-08-02 (×2): qty 1

## 2021-08-02 NOTE — Consult Note (Signed)
I have been asked to see the patient by Dr. Berle Mull, for evaluation and management of right pyelonephritis.  History of present illness: 61 year old white female with history of nephrolithiasis and recurrent infections who was treated for an infected stone about a month ago with a ureteral stent and sent home with Keflex.  We then treated her for that stone, but there was a small fragment remaining.  The surgery was otherwise uncomplicated.  Stent was placed.  Following the surgery she did reasonably well for the first 24 hours and then developed lethargy, fatigue, pain, and fever.  She was seen in the ER and subsequently admitted for pyelonephritis.  Since being in the hospital the patient has been afebrile.  She has been getting cefepime every 8 hours.  Her pain is better controlled.  Her urine cultures have not returned yet.  Her labs are otherwise stable and her vital signs have all been normal.  Review of systems: A 12 point comprehensive review of systems was obtained and is negative unless otherwise stated in the history of present illness.  Patient Active Problem List   Diagnosis Date Noted   Complicated UTI (urinary tract infection) 08/01/2021   Hepatic steatosis 08/01/2021   Uncontrolled type 2 diabetes mellitus with hyperglycemia, without long-term current use of insulin ([place]) 08/01/2021   H/O lithotripsy 08/01/2021   Sepsis secondary to UTI ([place]) 07/02/2021   Kidney stone 07/02/2021   Coronary artery calcification seen on CAT scan    PAT (paroxysmal atrial tachycardia) ([place]) 05/24/2018   OSA on CPAP    Chronic diastolic CHF (congestive heart failure) ([place])    HTN (hypertension) 04/22/2014   Morbid obesity ([place]) 12/10/2013   Hyperlipidemia    PECTUS EXCAVATUM 01/31/2008   DYSPNEA 01/31/2008    No current facility-administered medications on file prior to encounter.   Current Outpatient Medications on File Prior to Encounter  Medication Sig Dispense Refill   acetaminophen  (TYLENOL) 500 MG tablet Take 500-1,000 mg by mouth every 6 (six) hours as needed for headache (or pain).     allopurinol (ZYLOPRIM) 100 MG tablet Take 300 mg by mouth daily.     colchicine 0.6 MG tablet Take 0.6 mg by mouth daily as needed (For gout).     diltiazem (CARDIZEM CD) 120 MG 24 hr capsule TAKE 1 CAPSULE BY MOUTH EVERY DAY (Patient taking differently: Take 120 mg by mouth daily.) 90 capsule 3   estradiol (ESTRACE) 0.1 MG/GM vaginal cream Place 1 Applicatorful vaginally every 14 (fourteen) days.     Evolocumab (REPATHA SURECLICK) 233 MG/ML SOAJ Inject 1 pen into the skin every 14 (fourteen) days. 2 mL 11   levalbuterol (XOPENEX HFA) 45 MCG/ACT inhaler Inhale 1-2 puffs into the lungs every 4 (four) hours as needed for shortness of breath.     lisinopril (ZESTRIL) 10 MG tablet Take 1 tablet (10 mg total) by mouth daily. 90 tablet 3   metFORMIN (GLUCOPHAGE-XR) 500 MG 24 hr tablet TAKE 2 TABLETS BY MOUTH TWICE A DAY WITH MEALS (Patient taking differently: Take 500 mg by mouth daily with breakfast.) 360 tablet 0   metoprolol succinate (TOPROL-XL) 25 MG 24 hr tablet Take 1 tablet (25 mg total) by mouth daily. 90 tablet 3   phenazopyridine (PYRIDIUM) 200 MG tablet Take 1 tablet (200 mg total) by mouth 3 (three) times daily as needed for pain. 10 tablet 0   Potassium Citrate 15 MEQ (1620 MG) TBCR Take 10 mEq by mouth daily.     PRESCRIPTION  MEDICATION CPAP- At bedtime     tamsulosin (FLOMAX) 0.4 MG CAPS capsule Take 0.4 mg by mouth daily.     tiZANidine (ZANAFLEX) 4 MG tablet Take 0.5 tablets (2 mg total) by mouth every 6 (six) hours as needed for muscle spasms. 30 tablet 0   Dulaglutide (TRULICITY) 3 OZ/3.6UY SOPN Inject 4.5 mg into the skin once a week.     HYDROcodone-acetaminophen (NORCO/VICODIN) 5-325 MG tablet Take 1-2 tablets by mouth every 6 (six) hours as needed for moderate pain or severe pain. (Patient not taking: Reported on 08/01/2021) 30 tablet 0    Past Medical History:  Diagnosis  Date   Aortic atherosclerosis ([place])    CAD in native artery    very high calcium score at 875.  coronary CTA  showed 30% RCA and LAD.   Chronic diastolic CHF (congestive heart failure) ([place]) 2012   Diabetes mellitus type 2 in obese ([place])    Dyspnea    with exertion   Dysrhythmia    PVCs, PAC   Fibroid    History of kidney stones 09/2019   HTN (hypertension)    Hyperlipidemia    Morbid obesity ([facility])    OSA on CPAP    PAT (paroxysmal atrial tachycardia) ([facility])    Premature atrial contractions    PVC's (premature ventricular contractions)     Past Surgical History:  Procedure Laterality Date   ABDOMINAL HYSTERECTOMY  06/20/2008   BSO   Benign uterine polyps  01/18/2009   CYSTOSCOPY W/ URETERAL STENT PLACEMENT Right 07/02/2021   Procedure: CYSTOSCOPY WITH RETROGRADE PYELOGRAM/URETERAL STENT PLACEMENT;  Surgeon: Remi Haggard, MD;  Location: [facility];  Service: Urology;  Laterality: Right;   CYSTOSCOPY/URETEROSCOPY/HOLMIUM LASER/STENT PLACEMENT Right 07/28/2021   Procedure: RIGHT URETEROSCOPY/ RETROGRADE / HOLMIUM LASER/ STONE BASKETRY, STENT EXCHANGE;  Surgeon: Ardis Hughs, MD;  Location: [facility];  Service: Urology;  Laterality: Right;   DILATION AND CURETTAGE OF UTERUS  2010   HYSTEROSCOPY  2010   URETEROSCOPY WITH HOLMIUM LASER LITHOTRIPSY Left 2021    Social History   Tobacco Use   Smoking status: Never   Smokeless tobacco: Never  Vaping Use   Vaping Use: Never used  Substance Use Topics   Alcohol use: Yes    Comment: occ.   Drug use: No    Family History  Problem Relation Age of Onset   Hypertension Mother    Heart disease Father        Died age 33s of a cardiac event - was told it was atherosclerosis but also had PVCs   Healthy Sister    Arrhythmia Sister        PVCs   Depression Brother    Healthy Sister    Diabetes Paternal Grandmother    Arrhythmia Other        Distant relative with WPW.    PE: Vitals:   08/01/21 1604  08/01/21 1944 08/01/21 2004 08/02/21 0436  BP: (!) 149/75  (!) 132/59 116/67  Pulse: 100 99 (!) 102 93  Resp: 17 17 16 20   Temp: 99.8 F (37.7 C)  98.1 F (36.7 C) 98.6 F (37 C)  TempSrc: Oral  Oral Oral  SpO2: 97% 97% 94% 95%  Weight:      Height:       Patient appears to be in no acute distress  patient is alert and oriented x3 Atraumatic normocephalic head No cervical or supraclavicular lymphadenopathy appreciated No increased work of breathing, no audible wheezes/rhonchi  Regular sinus rhythm/rate Abdomen is soft, nontender, nondistended, no CVA or suprapubic tenderness Lower extremities are symmetric without appreciable edema Grossly neurologically intact No identifiable skin lesions  Recent Labs    07/31/21 2134 08/01/21 0901 08/02/21 0507  WBC 11.5* 9.8 11.8*  HGB 12.7 11.8* 12.4  HCT 38.9 36.1 37.9   Recent Labs    07/31/21 2134 08/01/21 0901 08/02/21 0507  NA 135 139 138  K 3.7 3.7 3.8  CL 97* 103 103  CO2 25 27 26   GLUCOSE 204* 141* 160*  BUN 10 10 11   CREATININE 0.77 0.67 0.56  CALCIUM 9.4 9.0 9.2   Recent Labs    07/31/21 2134  INR 1.0   No results for input(s): LABURIN in the last 72 hours. Results for orders placed or performed during the hospital encounter of 07/31/21  Resp Panel by RT-PCR (Flu A&B, Covid) Nasopharyngeal Swab     Status: None   Collection Time: 07/31/21  9:05 PM   Specimen: Nasopharyngeal Swab; Nasopharyngeal(NP) swabs in vial transport medium  Result Value Ref Range Status   SARS Coronavirus 2 by RT PCR NEGATIVE NEGATIVE Final    Comment: (NOTE) SARS-CoV-2 target nucleic acids are NOT DETECTED.  The SARS-CoV-2 RNA is generally detectable in upper respiratory specimens during the acute phase of infection. The lowest concentration of SARS-CoV-2 viral copies this assay can detect is 138 copies/mL. A negative result does not preclude SARS-Cov-2 infection and should not be used as the sole basis for treatment or other  patient management decisions. A negative result may occur with  improper specimen collection/handling, submission of specimen other than nasopharyngeal swab, presence of viral mutation(s) within the areas targeted by this assay, and inadequate number of viral copies(<138 copies/mL). A negative result must be combined with clinical observations, patient history, and epidemiological information. The expected result is Negative.  Fact Sheet for Patients:  EntrepreneurPulse.com.au  Fact Sheet for Healthcare Providers:  IncredibleEmployment.be  This test is no t yet approved or cleared by the [place] FDA and  has been authorized for detection and/or diagnosis of SARS-CoV-2 by FDA under an Emergency Use Authorization (EUA). This EUA will remain  in effect (meaning this test can be used) for the duration of the COVID-19 declaration under Section 564(b)(1) of the Act, 21 U.S.C.section 360bbb-3(b)(1), unless the authorization is terminated  or revoked sooner.       Influenza A by PCR NEGATIVE NEGATIVE Final   Influenza B by PCR NEGATIVE NEGATIVE Final    Comment: (NOTE) The Xpert Xpress SARS-CoV-2/FLU/RSV plus assay is intended as an aid in the diagnosis of influenza from Nasopharyngeal swab specimens and should not be used as a sole basis for treatment. Nasal washings and aspirates are unacceptable for Xpert Xpress SARS-CoV-2/FLU/RSV testing.  Fact Sheet for Patients: EntrepreneurPulse.com.au  Fact Sheet for Healthcare Providers: IncredibleEmployment.be  This test is not yet approved or cleared by the [place] FDA and has been authorized for detection and/or diagnosis of SARS-CoV-2 by FDA under an Emergency Use Authorization (EUA). This EUA will remain in effect (meaning this test can be used) for the duration of the COVID-19 declaration under Section 564(b)(1) of the Act, 21 U.S.C. section  360bbb-3(b)(1), unless the authorization is terminated or revoked.  Performed at [facility], [address], [place], [place] [address]     Imaging: none the CT scan demonstrates that the stent in the right kidney is in the appropriate position.  There is no hydroureteronephrosis.  There is a small  nonobstructing stone in the upper pole of the right kidney.  There are also 2 very small stones on the left that are nonobstructing.  There is no other significant findings.   Imp: 61 year old female with a history of nephrolithiasis and infection admitted with pyelonephritis.  Urine cultures are pending.  She has been improving on IV antibiotics.  Recommendations: Would await the urine culture results and treat the patient appropriately for 10 days.  The patient should remove the stent on Wednesday.  I spoke with her about that.  She has a stent tether emanating from the urethra which can just be pulled upon at the time of the stent removal.  This can be done at home or in the hospital, depending on where the patient is at that time.  However, initially we recommended that she for stent Monday.  Given the events that have unfolded I recommended that she wait 2 additional days and remove it on Wednesday.  Please contact me with any additional questions.  From a urologic perspective, there is not a whole lot else I can do.  Thank you   Ardis Hughs

## 2021-08-02 NOTE — Progress Notes (Signed)
°  Progress Note   Patient: Jaime Nichols VVO:[id number] DOB: 1961/01/05 DOA: 07/31/2021     Hospitalization day: 1 DOS: the patient was seen and examined on 08/02/2021   Brief hospital course: No notes on file  Assessment and Plan: * Complicated UTI (urinary tract infection)- (present on admission) Presents with fever. Recently undergone cystoscopy and lithotripsy for right stone removal. Urine cultures last admission were negative. New blood cultures and urine cultures performed this time. Currently on IV cefepime. We will monitor. Do not think the patient requires IV fluid for now. Urology signed off.  HTN (hypertension)- (present on admission) Blood pressure is stable. We will continue Toprol-XL. Hold lisinopril. Continue Cardizem.  Uncontrolled type 2 diabetes mellitus with hyperglycemia, without long-term current use of insulin ([place])- (present on admission) Hemoglobin A1c 8.0. Only on metformin and Trulicity outpatient. Currently on sliding scale insulin.  Will monitor.  Chronic diastolic CHF (congestive heart failure) ([place])- (present on admission) Volume status adequate. Did not require diuretics  OSA on CPAP Continue CPAP nightly.  Morbid obesity ([place])- (present on admission) Body mass index is 46.59 kg/m.  Placing the patient at high risk of poor outcome.  Hyperlipidemia- (present on admission) On [place] outpatient.        Subjective: Reports right lower back pain.  Not consistent with location for CVA tenderness.  No nausea no vomiting.  No diarrhea no constipation.  Physical Exam: Vitals:   08/01/21 2004 08/02/21 0436 08/02/21 1328 08/02/21 1924  BP: (!) 132/59 116/67 140/79   Pulse: (!) 102 93 84 81  Resp: 16 20 18 17   Temp: 98.1 F (36.7 C) 98.6 F (37 C) 98.6 F (37 C)   TempSrc: Oral Oral Oral   SpO2: 94% 95% 93%   Weight:      Height:       General: Appear in mild distress; no visible Abnormal Neck Mass Or lumps, Conjunctiva  normal Cardiovascular: S1 and S2 Present, no Murmur, Respiratory: good respiratory effort, Bilateral Air entry present and CTA, no Crackles, no wheezes Abdomen: Bowel Sound present Extremities: no Pedal edema Neurology: alert and oriented to time, place, and person Gait not checked due to patient safety concerns   Data Reviewed:  I have Reviewed nursing notes, Vitals, and Lab results since pt's last encounter. Pertinent lab results CBC and BMP I have ordered test including CBC and BMP I have reviewed the last note from urology,    Family Communication: None at bedside  Disposition: Status is: Inpatient Remains inpatient appropriate because: Need for IV antibiotics.  Potentially can go home on 2/14.  Author: Berle Mull, MD 08/02/2021 8:09 PM  For on call review [email or web address].

## 2021-08-02 NOTE — Discharge Instructions (Signed)
Foods high in purines:  -Beer and other alcoholic beverages -Gravies and sauces made with meat -Anchovies, sardines, herring, mussels, tuna, codfish, scallops, trout, and haddock; bacon; organ meats (such as liver or kidney); tripe; sweetbreads; wild game; goose -Yeast and yeast extracts (taken as supplements)  Foods high in oxalates: -Spinach -Rhubarb -Beets -Potato chips -[place] fries -Nuts and nut butters -coffee -tea -chocolate  From [employer] Dietetics

## 2021-08-02 NOTE — Progress Notes (Signed)
Initial Nutrition Assessment  DOCUMENTATION CODES:   Morbid obesity  INTERVENTION:   -Multivitamin with minerals daily  -Provided list of foods high in purines and oxalates  NUTRITION DIAGNOSIS:   Increased nutrient needs related to acute illness as evidenced by estimated needs.  GOAL:   Patient will meet greater than or equal to 90% of their needs  MONITOR:   PO intake, Supplement acceptance, Labs, Weight trends, I & O's  REASON FOR ASSESSMENT:   Malnutrition Screening Tool    ASSESSMENT:   61 y.o. female with medical history significant of HTN, OSA, CAD, HLD, ureteric stone S/P lithotripsy.  Patient was recently hospitalized for sepsis secondary to UTI due to obstructing UPJ stone.  Urology was consulted, patient underwent cystoscopy and insertion of the right JJ stent.  On 2/8 underwent cystoscopy and laser lithotripsy as well as stent exchange.  On 2/10 started to have fever with nausea.  Patient reports having a decreased appetite since 2/8, she has had abdominal pain. Was recently treated for sepsis r/t UTI a month ago, was placed on antibiotics which also impacted her appetite. Typically eats well, good appetite, pt very conscious  about what she eats. Tries to eat mainly organic, non-GMO foods. Pt also concerned about dyes and additives. Follows social media accounts aimed at healthy eating and shopping for alternative products.   Pt states she ate an omelet for breakfast. Has not felt hungry today for lunch. Encouraged consistent intakes to not skip meals if weight loss is desired. Pt states she has lost weight but she is not looking to gain it back.   Requesting information about low purine/oxalate diet as she was told she needs to limit these. Will provide in discharge instructions.   Per weight records, pt's weight has been steadily decreasing. Reports she would like weight loss.   Medications reviewed.  Labs reviewed:  CBGs: 162-209  NUTRITION - FOCUSED  PHYSICAL EXAM:  No depletions noted.  Diet Order:   Diet Order             Diet heart healthy/carb modified Room service appropriate? Yes; Fluid consistency: Thin  Diet effective now                   EDUCATION NEEDS:   Education needs have been addressed  Skin:  Skin Assessment: Skin Integrity Issues: Skin Integrity Issues:: Incisions Incisions: 2/8 rt perineum  Last BM:  2/11  Height:   Ht Readings from Last 1 Encounters:  07/31/21 5\' 3"  (1.6 m)    Weight:   Wt Readings from Last 1 Encounters:  07/31/21 119.3 kg    BMI:  Body mass index is 46.59 kg/m.  Estimated Nutritional Needs:   Kcal:  1500-1700  Protein:  75-90g  Fluid:  1.7L/day  Clayton Bibles, MS, RD, LDN Inpatient Clinical Dietitian Contact information available via Amion

## 2021-08-03 LAB — CBC
HCT: 41.9 % (ref 36.0–46.0)
Hemoglobin: 13.7 g/dL (ref 12.0–15.0)
MCH: 29.2 pg (ref 26.0–34.0)
MCHC: 32.7 g/dL (ref 30.0–36.0)
MCV: 89.3 fL (ref 80.0–100.0)
Platelets: 307 10*3/uL (ref 150–400)
RBC: 4.69 MIL/uL (ref 3.87–5.11)
RDW: 13.2 % (ref 11.5–15.5)
WBC: 10.9 10*3/uL — ABNORMAL HIGH (ref 4.0–10.5)
nRBC: 0 % (ref 0.0–0.2)

## 2021-08-03 LAB — GLUCOSE, CAPILLARY: Glucose-Capillary: 165 mg/dL — ABNORMAL HIGH (ref 70–99)

## 2021-08-03 LAB — BASIC METABOLIC PANEL
Anion gap: 10 (ref 5–15)
BUN: 14 mg/dL (ref 8–23)
CO2: 27 mmol/L (ref 22–32)
Calcium: 9.7 mg/dL (ref 8.9–10.3)
Chloride: 100 mmol/L (ref 98–111)
Creatinine, Ser: 0.8 mg/dL (ref 0.44–1.00)
GFR, Estimated: >60 mL/min (ref >60)
Glucose, Bld: 161 mg/dL — ABNORMAL HIGH (ref 70–99)
Potassium: 3.7 mmol/L (ref 3.5–5.1)
Sodium: 137 mmol/L (ref 135–145)

## 2021-08-03 MED ORDER — FLUCONAZOLE 200 MG PO TABS: 200.0000 mg | ORAL_TABLET | Freq: Every day | ORAL | 0 refills | Status: AC

## 2021-08-03 MED ORDER — AMOXICILLIN-POT CLAVULANATE 875-125 MG PO TABS
1.0000 | ORAL_TABLET | Freq: Two times a day (BID) | ORAL | Status: DC
  Administered 2021-08-03: 1 via ORAL
  Filled 2021-08-03: qty 1

## 2021-08-03 MED ORDER — AMOXICILLIN-POT CLAVULANATE 875-125 MG PO TABS
1.0000 | ORAL_TABLET | Freq: Two times a day (BID) | ORAL | 0 refills | Status: AC
Start: 2021-08-03 — End: 2021-08-12

## 2021-08-03 MED ORDER — FLUCONAZOLE 100 MG PO TABS
200.0000 mg | ORAL_TABLET | Freq: Every day | ORAL | Status: DC
  Administered 2021-08-03: 200 mg via ORAL
  Filled 2021-08-03: qty 2

## 2021-08-03 NOTE — Progress Notes (Signed)
Urology Inpatient Progress Report  UTI (urinary tract infection) [N39.0] Sepsis, due to unspecified organism, unspecified whether acute organ dysfunction present ([place]) [A41.9]    Intv/Subj: No acute events overnight. Patient is without complaint. Feels better now then she has in the last month Urine cultures growing yeast  Principal Problem:   Complicated UTI (urinary tract infection) Active Problems:   Hyperlipidemia   Morbid obesity (H[facility])   HTN (hypertension)   OSA on CPAP   Chronic diastolic CHF (congestive heart failure) ([place])   Kidney stone   Hepatic steatosis   Uncontrolled type 2 diabetes mellitus with hyperglycemia, without long-term current use of insulin ([place])   H/O lithotripsy  Current Facility-Administered Medications  Medication Dose Route Frequency Provider Last Rate Last Admin   acetaminophen (TYLENOL) tablet 650 mg  650 mg Oral Q6H PRN Lavina Hamman, MD       Or   acetaminophen (TYLENOL) suppository 650 mg  650 mg Rectal Q6H PRN Lavina Hamman, MD       allopurinol (ZYLOPRIM) tablet 300 mg  300 mg Oral q AM Molpus, John, MD   300 mg at 08/03/21 0648   bisacodyl (DULCOLAX) suppository 10 mg  10 mg Rectal Daily PRN Lavina Hamman, MD       ceFEPIme (MAXIPIME) 2 g in sodium chloride 0.9 % 100 mL IVPB  2 g Intravenous Q8H Erenest Blank, RPH 200 mL/hr at 08/03/21 0603 2 g at 08/03/21 0603   diltiazem (CARDIZEM CD) 24 hr capsule 120 mg  120 mg Oral Daily Molpus, John, MD   120 mg at 08/02/21 1056   enoxaparin (LOVENOX) injection 60 mg  60 mg Subcutaneous Q24H Lavina Hamman, MD   60 mg at 08/01/21 2144   HYDROcodone-acetaminophen (NORCO/VICODIN) 5-325 MG per tablet 1-2 tablet  1-2 tablet Oral Q6H PRN Molpus, John, MD       insulin aspart (novoLOG) injection 0-5 Units  0-5 Units Subcutaneous QHS Lavina Hamman, MD   2 Units at 08/01/21 2142   insulin aspart (novoLOG) injection 0-9 Units  0-9 Units Subcutaneous TID WC Lavina Hamman, MD   1 Units at 08/02/21  1733   levalbuterol (XOPENEX) nebulizer solution 0.63 mg  0.63 mg Inhalation Q4H PRN Lavina Hamman, MD       metoprolol succinate (TOPROL-XL) 24 hr tablet 25 mg  25 mg Oral Daily Molpus, John, MD   25 mg at 08/02/21 1056   multivitamin with minerals tablet 1 tablet  1 tablet Oral Daily Lavina Hamman, MD   1 tablet at 08/02/21 1425   ondansetron (ZOFRAN) tablet 4 mg  4 mg Oral Q6H PRN Lavina Hamman, MD       Or   ondansetron [facility]) injection 4 mg  4 mg Intravenous Q6H PRN Lavina Hamman, MD       phenazopyridine (PYRIDIUM) tablet 200 mg  200 mg Oral TID PRN Molpus, John, MD       polyethylene glycol (MIRALAX / GLYCOLAX) packet 17 g  17 g Oral Daily PRN Lavina Hamman, MD       tiZANidine (ZANAFLEX) tablet 2 mg  2 mg Oral Q6H PRN Lavina Hamman, MD   2 mg at 08/02/21 2236     Objective: Vital: Vitals:   08/02/21 1328 08/02/21 1924 08/02/21 2100 08/03/21 0525  BP: 140/79  127/60 (!) 142/77  Pulse: 84 81 91 91  Resp: 18 17 16 14   Temp: 98.6 F (37 C)  98.6  F (37 C) 97.6 F (36.4 C)  TempSrc: Oral  Oral Oral  SpO2: 93%  98% 98%  Weight:      Height:       I/Os: I/O last 3 completed shifts: In: 421.5 [P.O.:120; IV Piggyback:301.5] Out: -   Physical Exam:  General: Patient is in no apparent distress Lungs: Normal respiratory effort, chest expands symmetrically. GI:The abdomen is soft and nontender without mass. JP drain with serosanguinous drainage Ext: lower extremities symmetric  Lab Results: Recent Labs    08/01/21 0901 08/02/21 0507 08/03/21 0551  WBC 9.8 11.8* 10.9*  HGB 11.8* 12.4 13.7  HCT 36.1 37.9 41.9   Recent Labs    08/01/21 0901 08/02/21 0507 08/03/21 0551  NA 139 138 137  K 3.7 3.8 3.7  CL 103 103 100  CO2 27 26 27   GLUCOSE 141* 160* 161*  BUN 10 11 14   CREATININE 0.67 0.56 0.80  CALCIUM 9.0 9.2 9.7   Recent Labs    07/31/21 2134  INR 1.0   No results for input(s): LABURIN in the last 72 hours. Results for orders placed or  performed during the hospital encounter of 07/31/21  Resp Panel by RT-PCR (Flu A&B, Covid) Nasopharyngeal Swab     Status: None   Collection Time: 07/31/21  9:05 PM   Specimen: Nasopharyngeal Swab; Nasopharyngeal(NP) swabs in vial transport medium  Result Value Ref Range Status   SARS Coronavirus 2 by RT PCR NEGATIVE NEGATIVE Final    Comment: (NOTE) SARS-CoV-2 target nucleic acids are NOT DETECTED.  The SARS-CoV-2 RNA is generally detectable in upper respiratory specimens during the acute phase of infection. The lowest concentration of SARS-CoV-2 viral copies this assay can detect is 138 copies/mL. A negative result does not preclude SARS-Cov-2 infection and should not be used as the sole basis for treatment or other patient management decisions. A negative result may occur with  improper specimen collection/handling, submission of specimen other than nasopharyngeal swab, presence of viral mutation(s) within the areas targeted by this assay, and inadequate number of viral copies(<138 copies/mL). A negative result must be combined with clinical observations, patient history, and epidemiological information. The expected result is Negative.  Fact Sheet for Patients:  EntrepreneurPulse.com.au  Fact Sheet for Healthcare Providers:  IncredibleEmployment.be  This test is no t yet approved or cleared by the [place] FDA and  has been authorized for detection and/or diagnosis of SARS-CoV-2 by FDA under an Emergency Use Authorization (EUA). This EUA will remain  in effect (meaning this test can be used) for the duration of the COVID-19 declaration under Section 564(b)(1) of the Act, 21 U.S.C.section 360bbb-3(b)(1), unless the authorization is terminated  or revoked sooner.       Influenza A by PCR NEGATIVE NEGATIVE Final   Influenza B by PCR NEGATIVE NEGATIVE Final    Comment: (NOTE) The Xpert Xpress SARS-CoV-2/FLU/RSV plus assay is intended as  an aid in the diagnosis of influenza from Nasopharyngeal swab specimens and should not be used as a sole basis for treatment. Nasal washings and aspirates are unacceptable for Xpert Xpress SARS-CoV-2/FLU/RSV testing.  Fact Sheet for Patients: EntrepreneurPulse.com.au  Fact Sheet for Healthcare Providers: IncredibleEmployment.be  This test is not yet approved or cleared by the [place] FDA and has been authorized for detection and/or diagnosis of SARS-CoV-2 by FDA under an Emergency Use Authorization (EUA). This EUA will remain in effect (meaning this test can be used) for the duration of the COVID-19 declaration under Section 564(b)(1) of the  Act, 21 U.S.C. section 360bbb-3(b)(1), unless the authorization is terminated or revoked.  Performed at [facility], [address], [place], [place] [address]   Blood Culture (routine x 2)     Status: None (Preliminary result)   Collection Time: 07/31/21  9:32 PM   Specimen: BLOOD  Result Value Ref Range Status   Specimen Description   Final    BLOOD RIGHT ARM Performed at [facility], [address], [place], [place] [address]    Special Requests   Final    BOTTLES DRAWN AEROBIC AND ANAEROBIC Blood Culture adequate volume Performed at [facility], [address], [place], [place] [address]    Culture   Final    NO GROWTH < 24 HOURS Performed at [facility] [address] [place], [place] [address]    Report Status PENDING  Incomplete  Blood Culture (routine x 2)     Status: None (Preliminary result)   Collection Time: 07/31/21  9:37 PM   Specimen: BLOOD  Result Value Ref Range Status   Specimen Description   Final    BLOOD LEFT ARM Performed at [facility], [address], [place], [place] [address]    Special Requests   Final    BOTTLES DRAWN AEROBIC AND ANAEROBIC Blood Culture adequate  volume Performed at [facility], [address], [place], [place] [address]    Culture   Final    NO GROWTH < 24 HOURS Performed at [facility], [place] [address] [place], [place] [address]    Report Status PENDING  Incomplete  Urine Culture     Status: Abnormal   Collection Time: 07/31/21 11:20 PM   Specimen: In/Out Cath Urine  Result Value Ref Range Status   Specimen Description   Final    IN/OUT CATH URINE Performed at [facility], [address], [place], [place] [address]    Special Requests   Final    NONE Performed at [facility], [address] [place],  [address]    Culture 70,000 COLONIES/mL YEAST (A)  Final   Report Status 08/02/2021 FINAL  Final    Studies/Results: No results found.  Assessment: Urine cultures growing yeast, no bacteria noted.  Had taken one day of Augmentin prior to the current culture.  Unclear if she had candida UTI or whether the urine was cleared by the augmentin.  Plan: Would recommend that the patient be started on fluconazole today, pull stent tomorrow, and take the fluconazole one extra day.  Continue on Augmentin for a total of 10 days.   Louis Meckel, MD Urology 08/03/2021, 7:04 AM

## 2021-08-03 NOTE — Discharge Planning (Signed)
Patients medications and discharge paperwork went over with patient. Patients iv taken out per protocol and patient educated on discharge and new medications. Patients belongings with patient and patient awaiting wheelchair from husband for transportation.

## 2021-08-03 NOTE — Progress Notes (Signed)
Pharmacy Antibiotic Note  Jaime Nichols is a 61 y.o. female admitted on 1/61/0960 with  complicated UTI. Pharmacy has been consulted for Fluconazole dosing.  Plan: Fluconazole 200mg  PO daily  Duration of 3 days per MD  Need for further dosage adjustment appears unlikely at present, so pharmacy will sign off at this time. Please reconsult if a change in clinical status warrants re-evaluation of dosage.    Temp (24hrs), Avg:98.3 F (36.8 C), Min:97.6 F (36.4 C), Max:98.6 F (37 C)  Recent Labs  Lab 07/31/21 2134 08/01/21 0901 08/02/21 0507 08/03/21 0551  WBC 11.5* 9.8 11.8* 10.9*  CREATININE 0.77 0.67 0.56 0.80  LATICACIDVEN 1.3  --   --   --      Estimated Creatinine Clearance: 91.2 mL/min (by C-G formula based on SCr of 0.8 mg/dL).    Allergies  Allergen Reactions   Erythromycin Diarrhea and Nausea And Vomiting    Other reaction(s): stomach upset   Latex Other (See Comments)    REACTION: "CONTACT DERMATITIS"   Nickel Other (See Comments)    Poor healing- dental reasons   Statins Other (See Comments)    Joint pain   Crestor [Rosuvastatin] Other (See Comments)    Cannot take when taking Colchicine   Metformin Hcl Diarrhea and Other (See Comments)    Diarrhea at higher doses, GI Upset   Potassium Citrate Other (See Comments)    Affected eyes- "redness and crusty" at 30 mEq/daily; pt states she has no reaction to the potassium citrate that she is taking now and that this only happened when she was taking too much   Zocor [Simvastatin] Other (See Comments)    Joint pain    Lindell Spar, PharmD, BCPS Clinical Pharmacist 08/03/2021 8:31 AM

## 2021-08-04 DIAGNOSIS — B3749 Other urogenital candidiasis: Secondary | ICD-10-CM | POA: Diagnosis present

## 2021-08-04 NOTE — Discharge Summary (Signed)
Physician Discharge Summary   Patient: Jaime Nichols MRN: [id number] DOB: 20-Jun-1961  Admit date:     07/31/2021  Discharge date: 08/03/2021  Discharge Physician: Berle Mull   PCP: Cari Caraway, MD   Recommendations at discharge:    Follow up with urology as recommended  Discharge Diagnoses: Principal Problem:   Complicated UTI (urinary tract infection) Active Problems:   Kidney stone   H/O lithotripsy   HTN (hypertension)   Uncontrolled type 2 diabetes mellitus with hyperglycemia, without long-term current use of insulin (HCC)   Chronic diastolic CHF (congestive heart failure) (HCC)   Morbid obesity (HCC)   OSA on CPAP   Hepatic steatosis   Hyperlipidemia  Assessment and Plan: * Complicated UTI (urinary tract infection)- (present on admission) Yeast UTI Presents with fever. Recently undergone cystoscopy and lithotripsy for right stone removal. Urine cultures last admission were negative. blood cultures negative for now urine culture growing Yeast.  Was on IV cefepime. Urology consulted. Pt symptomatically better. Now on oral Augmentin and Diflucan. Pt will remove the stent tomorrow at home.   HTN (hypertension)- (present on admission) Blood pressure is stable.  Uncontrolled type 2 diabetes mellitus with hyperglycemia, without long-term current use of insulin ([place])- (present on admission) Hemoglobin A1c 8.0. Only on metformin and Trulicity outpatient.  Chronic diastolic CHF (congestive heart failure) ([place])- (present on admission) Volume status adequate. Did not require diuretics  OSA on CPAP Continue CPAP nightly.  Morbid obesity ([place])- (present on admission) Body mass index is 46.59 kg/m.  Placing the patient at high risk of poor outcome.  Hyperlipidemia- (present on admission) On Repatha outpatient.         Consultants: Urology Procedures performed: none  Disposition: Home Diet recommendation:  Discharge Diet Orders (From admission,  onward)     Start     Ordered   08/03/21 0000  Diet - low sodium heart healthy        08/03/21 1004            DISCHARGE MEDICATION: Allergies as of 08/03/2021       Reactions   Erythromycin Diarrhea, Nausea And Vomiting   Other reaction(s): stomach upset   Latex Other (See Comments)   REACTION: "CONTACT DERMATITIS"   Nickel Other (See Comments)   Poor healing- dental reasons   Statins Other (See Comments)   Joint pain   Crestor [rosuvastatin] Other (See Comments)   Cannot take when taking Colchicine   Metformin Hcl Diarrhea, Other (See Comments)   Diarrhea at higher doses, GI Upset   Potassium Citrate Other (See Comments)   Affected eyes- "redness and crusty" at 30 mEq/daily; pt states she has no reaction to the potassium citrate that she is taking now and that this only happened when she was taking too much   Zocor [simvastatin] Other (See Comments)   Joint pain        Medication List     STOP taking these medications    colchicine 0.6 MG tablet   tamsulosin 0.4 MG Caps capsule Commonly known as: FLOMAX       TAKE these medications    acetaminophen 500 MG tablet Commonly known as: TYLENOL Take 500-1,000 mg by mouth every 6 (six) hours as needed for headache (or pain).   allopurinol 100 MG tablet Commonly known as: ZYLOPRIM Take 300 mg by mouth daily.   amoxicillin-clavulanate 875-125 MG tablet Commonly known as: AUGMENTIN Take 1 tablet by mouth 2 (two) times daily for 9 days.   diltiazem 120  MG 24 hr capsule Commonly known as: CARDIZEM CD TAKE 1 CAPSULE BY MOUTH EVERY DAY What changed:  how much to take how to take this when to take this additional instructions   estradiol 0.1 MG/GM vaginal cream Commonly known as: ESTRACE Place 1 Applicatorful vaginally every 14 (fourteen) days.   fluconazole 200 MG tablet Commonly known as: DIFLUCAN Take 1 tablet (200 mg total) by mouth daily for 2 days.   HYDROcodone-acetaminophen 5-325 MG  tablet Commonly known as: NORCO/VICODIN Take 1-2 tablets by mouth every 6 (six) hours as needed for moderate pain or severe pain.   levalbuterol 45 MCG/ACT inhaler Commonly known as: XOPENEX HFA Inhale 1-2 puffs into the lungs every 4 (four) hours as needed for shortness of breath.   lisinopril 10 MG tablet Commonly known as: ZESTRIL Take 1 tablet (10 mg total) by mouth daily.   metFORMIN 500 MG 24 hr tablet Commonly known as: GLUCOPHAGE-XR TAKE 2 TABLETS BY MOUTH TWICE A DAY WITH MEALS What changed:  how much to take how to take this when to take this additional instructions   metoprolol succinate 25 MG 24 hr tablet Commonly known as: TOPROL-XL Take 1 tablet (25 mg total) by mouth daily.   phenazopyridine 200 MG tablet Commonly known as: Pyridium Take 1 tablet (200 mg total) by mouth 3 (three) times daily as needed for pain.   Potassium Citrate 15 MEQ (1620 MG) Tbcr Take 10 mEq by mouth daily.   PRESCRIPTION MEDICATION CPAP- At bedtime   Repatha SureClick 696 MG/ML [place] Generic drug: Evolocumab Inject 1 pen into the skin every 14 (fourteen) days.   tiZANidine 4 MG tablet Commonly known as: ZANAFLEX Take 0.5 tablets (2 mg total) by mouth every 6 (six) hours as needed for muscle spasms.   Trulicity 3 EX/5.2WU Sopn Generic drug: Dulaglutide Inject 4.5 mg into the skin once a week.         Discharge Exam: Filed Weights   07/31/21 2104  Weight: 119.3 kg   General: Appear in mild distress, no Rash; Oral Mucosa Clear, moist. no Abnormal Neck Mass Or lumps, Conjunctiva normal  Cardiovascular: S1 and S2 Present, no Murmur, Respiratory: good respiratory effort, Bilateral Air entry present and CTA, no Crackles, no wheezes Abdomen: Bowel Sound present, Soft and no tenderness Extremities: no Pedal edema Neurology: alert and oriented to time, place, and person affect appropriate. no new focal deficit Gait not checked due to patient safety concerns   Condition at  discharge: good  The results of significant diagnostics from this hospitalization (including imaging, microbiology, ancillary and laboratory) are listed below for reference.   Imaging Studies: DG Chest Port 1 View  Result Date: 07/31/2021 CLINICAL DATA:  Questionable sepsis. EXAM: PORTABLE CHEST 1 VIEW COMPARISON:  Chest radiograph dated 03/16/2020. FINDINGS: Cardiomegaly with vascular congestion. No focal consolidation, pleural effusion, pneumothorax. Atherosclerotic calcification of the aorta. Degenerative changes of the spine. No acute osseous pathology. IMPRESSION: Cardiomegaly with vascular congestion. No focal consolidation. Electronically Signed   By: Anner Crete M.D.   On: 07/31/2021 22:25   ECHOCARDIOGRAM COMPLETE  Result Date: 07/16/2021    ECHOCARDIOGRAM REPORT   Patient Name:   TEIGEN PARSLOW Date of Exam: 07/16/2021 Medical Rec #:  [id number]            Height:       63.0 in Accession #:    [id number]           Weight:       260.6 lb Date  of Birth:  04-Mar-1961           BSA:          2.164 m Patient Age:    40 years             BP:           126/80 mmHg Patient Gender: F                    HR:           87 bpm. Exam Location:  [facility] Procedure: 2D Echo, Cardiac Doppler and Color Doppler Indications:    R94.31 Abnormal EKG  History:        Patient has prior history of Echocardiogram examinations, most                 recent 06/29/2017. Abnormal ECG; Risk Factors:Morbid obesity,                 Hypertension, Diabetes and Dyslipidemia.  Sonographer:    Coralyn Helling RDCS Referring Phys: [id number] TRACI R TURNER  Sonographer Comments: Patient is morbidly obese. Image acquisition challenging due to patient body habitus. IMPRESSIONS  1. Left ventricular ejection fraction, by estimation, is 60 to 65%. The left ventricle has normal function. The left ventricle has no regional wall motion abnormalities. Left ventricular diastolic parameters were normal.  2. Right ventricular systolic  function is normal. The right ventricular size is normal.  3. Left atrial size was mildly dilated.  4. The mitral valve is normal in structure. No evidence of mitral valve regurgitation. No evidence of mitral stenosis.  5. The aortic valve is normal in structure. Aortic valve regurgitation is not visualized. No aortic stenosis is present.  6. The inferior vena cava is normal in size with greater than 50% respiratory variability, suggesting right atrial pressure of 3 mmHg. FINDINGS  Left Ventricle: Left ventricular ejection fraction, by estimation, is 60 to 65%. The left ventricle has normal function. The left ventricle has no regional wall motion abnormalities. The left ventricular internal cavity size was normal in size. There is  no left ventricular hypertrophy. Left ventricular diastolic parameters were normal. Right Ventricle: The right ventricular size is normal. No increase in right ventricular wall thickness. Right ventricular systolic function is normal. Left Atrium: Left atrial size was mildly dilated. Right Atrium: Right atrial size was normal in size. Pericardium: There is no evidence of pericardial effusion. Mitral Valve: The mitral valve is normal in structure. No evidence of mitral valve regurgitation. No evidence of mitral valve stenosis. Tricuspid Valve: The tricuspid valve is normal in structure. Tricuspid valve regurgitation is not demonstrated. No evidence of tricuspid stenosis. Aortic Valve: The aortic valve is normal in structure. Aortic valve regurgitation is not visualized. No aortic stenosis is present. Pulmonic Valve: The pulmonic valve was normal in structure. Pulmonic valve regurgitation is not visualized. No evidence of pulmonic stenosis. Aorta: The aortic root is normal in size and structure. Venous: The inferior vena cava is normal in size with greater than 50% respiratory variability, suggesting right atrial pressure of 3 mmHg. IAS/Shunts: No atrial level shunt detected by color flow  Doppler.  LEFT VENTRICLE PLAX 2D LVIDd:         4.10 cm   Diastology LVIDs:         2.70 cm   LV e' medial:    10.10 cm/s LV PW:         1.20 cm   LV E/e'  medial:  8.6 LV IVS:        1.00 cm   LV e' lateral:   11.40 cm/s LVOT diam:     1.90 cm   LV E/e' lateral: 7.6 LV SV:         57 LV SV Index:   26 LVOT Area:     2.84 cm  RIGHT VENTRICLE             IVC RV S prime:     20.70 cm/s  IVC diam: 1.00 cm TAPSE (M-mode): 1.7 cm LEFT ATRIUM             Index        RIGHT ATRIUM           Index LA diam:        4.00 cm 1.85 cm/m   RA Pressure: 3.00 mmHg LA Vol (A2C):   47.2 ml 21.81 ml/m  RA Area:     15.70 cm LA Vol (A4C):   43.0 ml 19.87 ml/m  RA Volume:   36.40 ml  16.82 ml/m LA Biplane Vol: 45.3 ml 20.94 ml/m  AORTIC VALVE LVOT Vmax:   105.00 cm/s LVOT Vmean:  73.500 cm/s LVOT VTI:    0.200 m  AORTA Ao Root diam: 3.30 cm Ao Asc diam:  3.40 cm MITRAL VALVE               TRICUSPID VALVE MV Area (PHT): 3.19 cm    Estimated RAP:  3.00 mmHg MV Decel Time: 238 msec MV E velocity: 86.50 cm/s  SHUNTS MV A velocity: 80.70 cm/s  Systemic VTI:  0.20 m MV E/A ratio:  1.07        Systemic Diam: 1.90 cm Jenkins Rouge MD Electronically signed by Jenkins Rouge MD Signature Date/Time: 07/16/2021/8:58:08 AM    Final    CT Renal Stone Study  Result Date: 07/31/2021 CLINICAL DATA:  Fever, status post lithotripsy EXAM: CT ABDOMEN AND PELVIS WITHOUT CONTRAST TECHNIQUE: Multidetector CT imaging of the abdomen and pelvis was performed following the standard protocol without IV contrast. RADIATION DOSE REDUCTION: This exam was performed according to the departmental dose-optimization program which includes automated exposure control, adjustment of the mA and/or kV according to patient size and/or use of iterative reconstruction technique. COMPARISON:  07/01/2021 FINDINGS: Lower chest: Lung bases are clear. Hepatobiliary: Moderate hepatic steatosis. Status post cholecystectomy. No intrahepatic or extrahepatic ductal dilatation.  Pancreas: Within normal limits. Spleen: Within normal limits. Adrenals/Urinary Tract: Adrenal glands are within normal limits. Left kidney is notable for a 7 mm nonobstructing left upper pole renal calculus. Right kidney is notable for a 16 mm upper pole cyst (series 2/image 37) and a 6 mm non-obstructing right lower pole renal calculus (series 2/image 41). Right double-pigtail ureteral stent in satisfactory position. No hydronephrosis. Perinephric fluid/stranding is improved when compared to the prior. Low lying bladder with cystocele at rest (sagittal image 83). Stomach/Bowel: Stomach is within normal limits. No evidence of bowel obstruction. Normal appendix (series 2/image 51). No colonic wall thickening or inflammatory changes. Vascular/Lymphatic: No evidence of abdominal aortic aneurysm. Atherosclerotic calcifications of the abdominal aorta and branch vessels. No suspicious abdominopelvic lymphadenopathy. Reproductive: Status post hysterectomy. No adnexal masses. Other: No abdominopelvic ascites. Musculoskeletal: Mild degenerative changes of the lower thoracic spine. IMPRESSION: Bilateral nonobstructing calculi, measuring up to 7 mm on the left. Right double-pigtail ureteral stent in satisfactory position. No hydronephrosis. Low-lying bladder with cystocele at rest. Moderate hepatic steatosis. Electronically Signed   By: Bertis Ruddy  [place] Pink M.D.   On: 07/31/2021 23:01    Microbiology: Results for orders placed or performed during the hospital encounter of 07/31/21  Resp Panel by RT-PCR (Flu A&B, Covid) Nasopharyngeal Swab     Status: None   Collection Time: 07/31/21  9:05 PM   Specimen: Nasopharyngeal Swab; Nasopharyngeal(NP) swabs in vial transport medium  Result Value Ref Range Status   SARS Coronavirus 2 by RT PCR NEGATIVE NEGATIVE Final    Comment: (NOTE) SARS-CoV-2 target nucleic acids are NOT DETECTED.  The SARS-CoV-2 RNA is generally detectable in upper respiratory specimens during the acute  phase of infection. The lowest concentration of SARS-CoV-2 viral copies this assay can detect is 138 copies/mL. A negative result does not preclude SARS-Cov-2 infection and should not be used as the sole basis for treatment or other patient management decisions. A negative result may occur with  improper specimen collection/handling, submission of specimen other than nasopharyngeal swab, presence of viral mutation(s) within the areas targeted by this assay, and inadequate number of viral copies(<138 copies/mL). A negative result must be combined with clinical observations, patient history, and epidemiological information. The expected result is Negative.  Fact Sheet for Patients:  EntrepreneurPulse.com.au  Fact Sheet for Healthcare Providers:  IncredibleEmployment.be  This test is no t yet approved or cleared by the [place] FDA and  has been authorized for detection and/or diagnosis of SARS-CoV-2 by FDA under an Emergency Use Authorization (EUA). This EUA will remain  in effect (meaning this test can be used) for the duration of the COVID-19 declaration under Section 564(b)(1) of the Act, 21 U.S.C.section 360bbb-3(b)(1), unless the authorization is terminated  or revoked sooner.       Influenza A by PCR NEGATIVE NEGATIVE Final   Influenza B by PCR NEGATIVE NEGATIVE Final    Comment: (NOTE) The Xpert Xpress SARS-CoV-2/FLU/RSV plus assay is intended as an aid in the diagnosis of influenza from Nasopharyngeal swab specimens and should not be used as a sole basis for treatment. Nasal washings and aspirates are unacceptable for Xpert Xpress SARS-CoV-2/FLU/RSV testing.  Fact Sheet for Patients: EntrepreneurPulse.com.au  Fact Sheet for Healthcare Providers: IncredibleEmployment.be  This test is not yet approved or cleared by the [place] FDA and has been authorized for detection and/or diagnosis of  SARS-CoV-2 by FDA under an Emergency Use Authorization (EUA). This EUA will remain in effect (meaning this test can be used) for the duration of the COVID-19 declaration under Section 564(b)(1) of the Act, 21 U.S.C. section 360bbb-3(b)(1), unless the authorization is terminated or revoked.  Performed at [facility], [address], [place], [place] [address]   Blood Culture (routine x 2)     Status: None (Preliminary result)   Collection Time: 07/31/21  9:32 PM   Specimen: BLOOD  Result Value Ref Range Status   Specimen Description   Final    BLOOD RIGHT ARM Performed at [facility], [address], [place], [place] [address]    Special Requests   Final    BOTTLES DRAWN AEROBIC AND ANAEROBIC Blood Culture adequate volume Performed at [facility], [address] [place], [place] [address]    Culture   Final    NO GROWTH 3 DAYS Performed at [facility], [place] [address], [place], [place] [address]    Report Status PENDING  Incomplete  Blood Culture (routine x 2)     Status: None (Preliminary result)   Collection Time: 07/31/21  9:37 PM   Specimen: BLOOD  Result Value Ref Range Status   Specimen Description   Final    BLOOD LEFT ARM Performed at [facility], [address] [place], [place] [address]    Special Requests   Final    BOTTLES DRAWN AEROBIC AND ANAEROBIC Blood Culture adequate volume Performed at [facility], [address] [place], [place] [address]    Culture   Final    NO GROWTH 3 DAYS Performed at [facility], [place] [address] [place], [place] [address]    Report Status PENDING  Incomplete  Urine Culture     Status: Abnormal   Collection Time: 07/31/21 11:20 PM   Specimen: In/Out Cath Urine  Result Value Ref Range Status   Specimen Description   Final    IN/OUT CATH URINE Performed at [facility], [address] [place], [place] [address]    Special Requests   Final    NONE Performed at [facility], [address] [place], [place] [address]    Culture 70,000 COLONIES/mL YEAST (A)  Final   Report Status 08/02/2021 FINAL  Final    Labs: CBC: Recent Labs  Lab 07/31/21 2134 08/01/21 0901 08/02/21 0507 08/03/21 0551  WBC 11.5* 9.8 11.8* 10.9*  NEUTROABS 9.0* 6.7  --   --   HGB 12.7 11.8* 12.4 13.7  HCT 38.9 36.1 37.9 41.9  MCV 86.8 88.0 90.2 89.3  PLT 228 201 258 970   Basic Metabolic Panel: Recent Labs  Lab 07/31/21 2134 08/01/21 0901 08/02/21 0507 08/03/21 0551  NA 135 139 138 137  K 3.7 3.7 3.8 3.7  CL 97* 103 103 100  CO2 25 27 26 27   GLUCOSE 204* 141* 160* 161*  BUN 10 10 11 14   CREATININE 0.77 0.67 0.56 0.80  CALCIUM 9.4 9.0 9.2 9.7   Liver Function Tests: Recent Labs  Lab 07/31/21 2134  AST 16  ALT 28  ALKPHOS 94  BILITOT 0.9  PROT 7.1  ALBUMIN 4.2   CBG: Recent Labs  Lab 08/01/21 2129 08/02/21 0729 08/02/21 1154 08/02/21 1724 08/03/21 0739  GLUCAP 209* 162* 150* 142* 165*    Discharge time spent: greater than 30 minutes.  Signed: Berle Mull, MD [facility]

## 2021-08-06 LAB — CULTURE, BLOOD (ROUTINE X 2)
Culture: NO GROWTH
Culture: NO GROWTH
Special Requests: ADEQUATE
Special Requests: ADEQUATE

## 2021-08-09 ENCOUNTER — Telehealth: Payer: Self-pay | Admitting: *Deleted

## 2021-08-09 DIAGNOSIS — N390 Urinary tract infection, site not specified: Secondary | ICD-10-CM | POA: Diagnosis not present

## 2021-08-09 DIAGNOSIS — B372 Candidiasis of skin and nail: Secondary | ICD-10-CM | POA: Diagnosis not present

## 2021-08-09 DIAGNOSIS — N200 Calculus of kidney: Secondary | ICD-10-CM | POA: Diagnosis not present

## 2021-08-09 NOTE — Progress Notes (Signed)
Spoke with patient and gave her the report of her compliance download. She stated her understanding and appreciation for the call.

## 2021-08-09 NOTE — Telephone Encounter (Signed)
I called this patient to give her the results of her compliance download and she asked that I send a message to let Dr Radford Pax know that she has been in and out of the hospital over the last month, had sepsis and she would like to know if this has caused any further damage to her heart. She was particularly wanting a comparison of her recent EKG that was done in the hospital to the EKG that was done prior to 07/01/2021. Patient can be reached at [phone].   Thanks, Helios Kohlmann

## 2021-08-09 NOTE — Telephone Encounter (Signed)
Pt made aware of Dr. Theodosia Blender review/findings. Pt appreciates our follow up

## 2021-08-19 ENCOUNTER — Other Ambulatory Visit: Payer: Self-pay | Admitting: Cardiology

## 2021-08-19 DIAGNOSIS — E785 Hyperlipidemia, unspecified: Secondary | ICD-10-CM

## 2021-08-19 DIAGNOSIS — I2510 Atherosclerotic heart disease of native coronary artery without angina pectoris: Secondary | ICD-10-CM

## 2021-08-20 ENCOUNTER — Telehealth: Payer: Self-pay

## 2021-08-23 ENCOUNTER — Telehealth: Payer: Self-pay | Admitting: *Deleted

## 2021-08-23 NOTE — Telephone Encounter (Signed)
Should be ok for the lamisil and can start laser in next couple of weeks

## 2021-08-23 NOTE — Telephone Encounter (Signed)
Patient is calling because she was recommended the laser and oral Lamisil  but became sick for a month in and out of the hospital with kidney stones. ?Should there be any adjustments made to this since having kidney problems? When can she start the laser,how many weeks? Please advise. ?

## 2021-08-23 NOTE — Telephone Encounter (Signed)
Called and spoke w/pt and they stated that their issue had been resolved.  ?

## 2021-08-24 ENCOUNTER — Telehealth: Payer: Self-pay | Admitting: Podiatry

## 2021-08-24 NOTE — Telephone Encounter (Signed)
Pt called asking how she would go about the laser and Lamisil treatment. Would she do the Lamisil one week and the laser a different week? Please Advise ?

## 2021-08-24 NOTE — Telephone Encounter (Signed)
She can do at same time

## 2021-08-25 NOTE — Telephone Encounter (Signed)
Lvm for pt that she could take the Lamisil and do laser at the same time and to call and discuss further with the nurse if any more questions. ?

## 2021-08-30 DIAGNOSIS — L738 Other specified follicular disorders: Secondary | ICD-10-CM | POA: Diagnosis not present

## 2021-08-31 DIAGNOSIS — N200 Calculus of kidney: Secondary | ICD-10-CM | POA: Diagnosis not present

## 2021-09-02 DIAGNOSIS — E790 Hyperuricemia without signs of inflammatory arthritis and tophaceous disease: Secondary | ICD-10-CM | POA: Diagnosis not present

## 2021-09-07 DIAGNOSIS — L304 Erythema intertrigo: Secondary | ICD-10-CM | POA: Diagnosis not present

## 2021-09-07 DIAGNOSIS — D485 Neoplasm of uncertain behavior of skin: Secondary | ICD-10-CM | POA: Diagnosis not present

## 2021-09-07 DIAGNOSIS — D225 Melanocytic nevi of trunk: Secondary | ICD-10-CM | POA: Diagnosis not present

## 2021-09-07 DIAGNOSIS — L738 Other specified follicular disorders: Secondary | ICD-10-CM | POA: Diagnosis not present

## 2021-09-07 DIAGNOSIS — D1801 Hemangioma of skin and subcutaneous tissue: Secondary | ICD-10-CM | POA: Diagnosis not present

## 2021-09-07 DIAGNOSIS — L853 Xerosis cutis: Secondary | ICD-10-CM | POA: Diagnosis not present

## 2021-09-07 DIAGNOSIS — E538 Deficiency of other specified B group vitamins: Secondary | ICD-10-CM | POA: Diagnosis not present

## 2021-09-09 DIAGNOSIS — N200 Calculus of kidney: Secondary | ICD-10-CM | POA: Diagnosis not present

## 2021-09-10 ENCOUNTER — Ambulatory Visit: Payer: Self-pay | Admitting: Cardiology

## 2021-09-10 ENCOUNTER — Other Ambulatory Visit: Payer: Self-pay

## 2021-09-10 ENCOUNTER — Ambulatory Visit (INDEPENDENT_AMBULATORY_CARE_PROVIDER_SITE_OTHER): Payer: BC Managed Care – PPO

## 2021-09-10 DIAGNOSIS — B351 Tinea unguium: Secondary | ICD-10-CM

## 2021-09-10 NOTE — Progress Notes (Signed)
Patient presents today for the 1st laser treatment. Diagnosed with mycotic nail infection by Dr. Paulla Dolly.  ? ?Toenail most affected right 3rd. ? ?All other systems are negative. ? ?Nails were filed thin. Laser therapy was administered to right 1-5 toenails  and patient tolerated the treatment well. All safety precautions were in place.  ? ? ?Follow up in 6 weeks for laser # 2. ? ? ?

## 2021-09-14 DIAGNOSIS — L98499 Non-pressure chronic ulcer of skin of other sites with unspecified severity: Secondary | ICD-10-CM | POA: Diagnosis not present

## 2021-09-14 DIAGNOSIS — D485 Neoplasm of uncertain behavior of skin: Secondary | ICD-10-CM | POA: Diagnosis not present

## 2021-09-20 DIAGNOSIS — N209 Urinary calculus, unspecified: Secondary | ICD-10-CM | POA: Diagnosis not present

## 2021-09-20 DIAGNOSIS — I509 Heart failure, unspecified: Secondary | ICD-10-CM | POA: Diagnosis not present

## 2021-09-20 DIAGNOSIS — E1165 Type 2 diabetes mellitus with hyperglycemia: Secondary | ICD-10-CM | POA: Diagnosis not present

## 2021-09-23 DIAGNOSIS — M1712 Unilateral primary osteoarthritis, left knee: Secondary | ICD-10-CM | POA: Diagnosis not present

## 2021-09-23 DIAGNOSIS — M25562 Pain in left knee: Secondary | ICD-10-CM | POA: Diagnosis not present

## 2021-10-09 ENCOUNTER — Encounter: Payer: Self-pay | Admitting: Physician Assistant

## 2021-10-09 NOTE — Progress Notes (Addendum)
? ?Cardiology Office Note   ? ?Date:  10/11/2021  ? ?ID:  Jaime Nichols, DOB 12-29-1960, MRN [id number] ? ?PCP:  Cari Caraway, MD  ?Cardiologist:  Fransico Him, MD  ?Electrophysiologist:  None  ? ?Chief Complaint: f/u SOB, PACs/PVCs, CAD ? ?History of Present Illness:  ? ?Jaime Cannell Medero ([place]) is a 61 y.o. female with history of OSA (followed by Dr. Radford Pax), morbid obesity, HTN, chronic diastolic CHF, DM, nonsustained atrial tach, PACs, PVCs, nonobstructive CAD by cor CT 2020, old granulomatous disease and fatty liver by imaging, possible asthma who presents for post-hospital follow-up.  ? ?Per history review, she has a longstanding history of dyspnea with exertion and exaggerated HR response with activity even back to her 20s.  She's had previous evaluation for dyspnea and palpitations with [place] with findings summarized in PMH. Her SOB has been felt multifactorial from obesity, pectus excavatum and possible asthma, also followed by pulmonology (last pulm OV 2019, refused Breo at that visit). ? ?When seen in 2019 she noted heart racing and dyspnea with minimal activity which was replicated in clinic. D-dimer was normal adjusted for age and nuclear stress test was reassuring. CXR showed borderline cardiomegaly, aortic atherosclerosis, calcified mediastinal lymph nodes, clear lungs. It was recommended she titrate Toprol but she declined as she was terrified to do so so diltiazem was added. She was counseled on dire need for weight loss. She was referred for CPX and pulmonology evaluation. CPX showed normal functional capacity with significant limitation due to severe obesity and related restrictive properties. She had an exaggerated heart rate and hypertensive response to exercise with mild oxygen desaturations to a low of 91% at peak exercise. High resolution CT scan 12/06/17 showed no evidence of interstitial lung disease. PFTs 12/2017 showed small airways obstruction with mild restriction with a  low ERV, this can be seen in poor patient effort, neuromuscular weakness or obesity.  ? ?More recently since that time she had a coronary CTA 08/2018 showing mild nonobstructive CAD and a nuclear stress test in 03/2020 that was normal. The CT scan in 2020 did also show calcified lymph nodees compatible with old granulomatous disease.  She has worn several monitors in the past, with findings showing a combination of NSR, sinus tach, PVCs, PACs, and nonsustained atrial tach - the last of which in 04/2020 only showed <1% PVCs. Her most recent echo in 06/2021 showed EF 35-32%, normal diastolic function, normal RV function, mild LAE, no significant valvular disease. She was last in the hospital 02/9241 for complicated UTI. ? ?She is seen back for follow-up overall doing well from a cardiac standpoint. With a combination of weight loss (26lb in last few years) and breathing exercises she feels her SOB has improved quite a bit. She also feels that palpitations are well controlled have been well controlled ever since she added diltiazem to her regimen. She is compliant with CPAP. She came with a list of questions to clarify about her medical history that I did my best to answer. She clarifies she has not been on Crestor for a long time - stopped previously due to myalgias and also drug interaction with colchicine. ? ?Labwork independently reviewed: ?07/2021 hgb 13.7, plt 307, K 3.7, Cr 0.80 ?06/2021 AST 32, ALT 50, A1c 8 ?11/2020 trig 212, LDL 49 ?05/2017 TSH wnl ?2018 Mg wnl  ? ?Cardiology Studies:  ? ?Studies reviewed are outlined and summarized above. Reports included below if pertinent.  ? ?2d echo 06/2021 ? ? 1. Left ventricular  ejection fraction, by estimation, is 60 to 65%. The  ?left ventricle has normal function. The left ventricle has no regional  ?wall motion abnormalities. Left ventricular diastolic parameters were  ?normal.  ? 2. Right ventricular systolic function is normal. The right ventricular  ?size is normal.  ?  3. Left atrial size was mildly dilated.  ? 4. The mitral valve is normal in structure. No evidence of mitral valve  ?regurgitation. No evidence of mitral stenosis.  ? 5. The aortic valve is normal in structure. Aortic valve regurgitation is  ?not visualized. No aortic stenosis is present.  ? 6. The inferior vena cava is normal in size with greater than 50%  ?respiratory variability, suggesting right atrial pressure of 3 mmHg.  ? ?Nuclear stress test 2021 ?There was no ST segment deviation noted during stress. ?The left ventricular ejection fraction is normal (55-65%). ?Nuclear stress EF: 64%. ?The study is normal. ?This is a low risk study ? ?Cor CT 08/2018 ?FINDINGS: ?Non-cardiac: See separate report from [facility]. No ?significant findings on limited lung and soft tissue windows. ?  ?Calcium score: Calcium noted in LAD and RCA ?  ?Coronary Arteries: Right dominant with no anomalies ?  ?LM: Normal ?  ?LAD: Less than 30% calcified plaque in proximal vessel. 50% < ?calcified plaque in mid vessel ?  ?D1: Normal ?  ?D2: Normal ?  ?Circumflex: Normal ?  ?OM1: Normal ?  ?OM2: Normal ?  ?RCA: Less than 30% calcified plaque in proximal mid and distal ?vessel ?  ?PDA: Normal ?  ?PLA: Normal ?  ?IMPRESSION: ?1.  Calcium score 846 which is 99 th percentile for age and sex ?  ?2.  Non obstructive CAD see description above ?  ?3.  Normal aortic root ?  ?Note would not repeat cardiac CT in future Poor quality study due to ?obesity and inability to reduce HR below 80 bpm despite 20 mg iv ?lopressor and 200 mg PO ?  ?Study not suitable for FFR CT due to poor opacification ?  ?Jenkins Rouge ?  ?  ?Electronically Signed ?  By: Jenkins Rouge M.D. ?  On: 08/24/2018 12:41 ? ?IMPRESSION: ?Calcified lymph nodes compatible with old granulomatous disease. ?  ?Probable fatty infiltration of the liver. ?  ?No acute findings. ?  ?Electronically Signed: ?By: Rolm Baptise M.D. ?On: 08/24/2018 12:31 ? ?  ? ? ?Past Medical History:   ?Diagnosis Date  ? Aortic atherosclerosis ([place])   ? Asthma   ? CAD in native artery   ? very high calcium score at 875.  coronary CTA  showed 30% RCA and LAD.  ? Chronic diastolic CHF (congestive heart failure) ([place]) 2012  ? Diabetes mellitus type 2 in obese [facility])   ? Dyspnea   ? with exertion  ? Dysrhythmia   ? PVCs, PAC  ? Fatty liver   ? Fibroid   ? History of kidney stones 09/2019  ? HTN (hypertension)   ? Hyperlipidemia   ? Morbid obesity ([place])   ? OSA on CPAP   ? PAT (paroxysmal atrial tachycardia) ([place])   ? Premature atrial contractions   ? PVC's (premature ventricular contractions)   ? ? ?Past Surgical History:  ?Procedure Laterality Date  ? ABDOMINAL HYSTERECTOMY  06/20/2008  ? BSO  ? Benign uterine polyps  01/18/2009  ? CYSTOSCOPY W/ URETERAL STENT PLACEMENT Right 07/02/2021  ? Procedure: CYSTOSCOPY WITH RETROGRADE PYELOGRAM/URETERAL STENT PLACEMENT;  Surgeon: Remi Haggard, MD;  Location: [facility];  Service: Urology;  Laterality: Right;  ? CYSTOSCOPY/URETEROSCOPY/HOLMIUM LASER/STENT PLACEMENT Right 07/28/2021  ? Procedure: RIGHT URETEROSCOPY/ RETROGRADE / HOLMIUM LASER/ STONE BASKETRY, STENT EXCHANGE;  Surgeon: Ardis Hughs, MD;  Location: [facility];  Service: Urology;  Laterality: Right;  ? DILATION AND CURETTAGE OF UTERUS  2010  ? HYSTEROSCOPY  2010  ? URETEROSCOPY WITH HOLMIUM LASER LITHOTRIPSY Left 2021  ? ? ?Current Medications: ?Current Meds  ?Medication Sig  ? acetaminophen (TYLENOL) 500 MG tablet Take 500-1,000 mg by mouth every 6 (six) hours as needed for headache (or pain).  ? allopurinol (ZYLOPRIM) 300 MG tablet Take 300 mg by mouth daily.  ? colchicine 0.6 MG tablet as needed.  ? CRESTOR 20 MG tablet Take by mouth daily.  ? D 1000 25 MCG (1000 UT) capsule Take 1,000 Units by mouth 3 (three) times a week.  ? diltiazem (CARDIZEM CD) 120 MG 24 hr capsule TAKE 1 CAPSULE BY MOUTH EVERY DAY  ? Dulaglutide (TRULICITY) 3 QH/4.7ML SOPN Inject 4.5 mg into the skin once a week.  ?  estradiol (ESTRACE) 0.1 MG/GM vaginal cream Place 1 Applicatorful vaginally every 14 (fourteen) days.  ? Evolocumab (REPATHA SURECLICK) 465 MG/ML SOAJ INJECT 1 PEN INTO THE SKIN EVERY 14 (FOURTEEN) DAYS.

## 2021-10-11 ENCOUNTER — Ambulatory Visit (INDEPENDENT_AMBULATORY_CARE_PROVIDER_SITE_OTHER): Payer: BC Managed Care – PPO | Admitting: Physician Assistant

## 2021-10-11 ENCOUNTER — Encounter: Payer: Self-pay | Admitting: Physician Assistant

## 2021-10-11 VITALS — BP 130/72 | HR 80 | Ht 62.5 in | Wt 260.0 lb

## 2021-10-11 DIAGNOSIS — E785 Hyperlipidemia, unspecified: Secondary | ICD-10-CM

## 2021-10-11 DIAGNOSIS — I491 Atrial premature depolarization: Secondary | ICD-10-CM

## 2021-10-11 DIAGNOSIS — I471 Supraventricular tachycardia: Secondary | ICD-10-CM

## 2021-10-11 DIAGNOSIS — I2510 Atherosclerotic heart disease of native coronary artery without angina pectoris: Secondary | ICD-10-CM | POA: Diagnosis not present

## 2021-10-11 DIAGNOSIS — I5032 Chronic diastolic (congestive) heart failure: Secondary | ICD-10-CM | POA: Diagnosis not present

## 2021-10-11 DIAGNOSIS — I10 Essential (primary) hypertension: Secondary | ICD-10-CM

## 2021-10-11 DIAGNOSIS — R0609 Other forms of dyspnea: Secondary | ICD-10-CM

## 2021-10-11 DIAGNOSIS — I493 Ventricular premature depolarization: Secondary | ICD-10-CM

## 2021-10-11 NOTE — Patient Instructions (Signed)
Medication Instructions:  ?DISCONTINUE Crestor ?*If you need a refill on your cardiac medications before your next appointment, please call your pharmacy* ? ? ?Lab Work: ?Your physician recommends that you return for lab work in: AT [place] ?If you have labs (blood work) drawn today and your tests are completely normal, you will receive your results only by: ?MyChart Message (if you have MyChart) OR ?A paper copy in the mail ?If you have any lab test that is abnormal or we need to change your treatment, we will call you to review the results. ? ? ?Testing/Procedures: ?NONE ORDERED ? ? ?Follow-Up: ?At [facility], you and your health needs are our priority.  As part of our continuing mission to provide you with exceptional heart care, we have created designated Provider Care Teams.  These Care Teams include your primary Cardiologist (physician) and Advanced Practice Providers (APPs -  Physician Assistants and Nurse Practitioners) who all work together to provide you with the care you need, when you need it. ? ?We recommend signing up for the patient portal called "MyChart".  Sign up information is provided on this After Visit Summary.  MyChart is used to connect with patients for Virtual Visits (Telemedicine).  Patients are able to view lab/test results, encounter notes, upcoming appointments, etc.  Non-urgent messages can be sent to your provider as well.   ?To learn more about what you can do with MyChart, go to [email or web address].   ? ?Your next appointment:   ?6 month(s) (November 2023) ? ?The format for your next appointment:   ?In Person ? ?Provider:   ?Fransico Him, MD   ? ? ?Other Instructions ? ? ?Important Information About Sugar ? ? ? ? ?  ?

## 2021-10-12 DIAGNOSIS — E119 Type 2 diabetes mellitus without complications: Secondary | ICD-10-CM | POA: Diagnosis not present

## 2021-10-12 DIAGNOSIS — H5213 Myopia, bilateral: Secondary | ICD-10-CM | POA: Diagnosis not present

## 2021-10-14 DIAGNOSIS — M1712 Unilateral primary osteoarthritis, left knee: Secondary | ICD-10-CM | POA: Diagnosis not present

## 2021-10-22 ENCOUNTER — Ambulatory Visit (INDEPENDENT_AMBULATORY_CARE_PROVIDER_SITE_OTHER): Payer: Self-pay

## 2021-10-22 DIAGNOSIS — B351 Tinea unguium: Secondary | ICD-10-CM

## 2021-10-22 NOTE — Progress Notes (Signed)
Patient presents today for the 2nd laser treatment. Diagnosed with mycotic nail infection by Dr. Paulla Dolly.  ? ?Toenail most affected right 3rd. ? ?All other systems are negative. ? ?Nails were filed thin. Laser therapy was administered to right 1-5 toenails  and patient tolerated the treatment well. All safety precautions were in place.  ? ? ?Follow up in 6 weeks for laser # 3. ? ? ?

## 2021-10-26 DIAGNOSIS — N200 Calculus of kidney: Secondary | ICD-10-CM | POA: Diagnosis not present

## 2021-11-03 DIAGNOSIS — M170 Bilateral primary osteoarthritis of knee: Secondary | ICD-10-CM | POA: Diagnosis not present

## 2021-11-03 DIAGNOSIS — S83421A Sprain of lateral collateral ligament of right knee, initial encounter: Secondary | ICD-10-CM | POA: Diagnosis not present

## 2021-11-04 DIAGNOSIS — N200 Calculus of kidney: Secondary | ICD-10-CM | POA: Diagnosis not present

## 2021-11-04 DIAGNOSIS — R1012 Left upper quadrant pain: Secondary | ICD-10-CM | POA: Diagnosis not present

## 2021-11-04 DIAGNOSIS — E1159 Type 2 diabetes mellitus with other circulatory complications: Secondary | ICD-10-CM | POA: Diagnosis not present

## 2021-11-04 DIAGNOSIS — K760 Fatty (change of) liver, not elsewhere classified: Secondary | ICD-10-CM | POA: Diagnosis not present

## 2021-11-12 ENCOUNTER — Other Ambulatory Visit: Payer: BC Managed Care – PPO

## 2021-11-23 ENCOUNTER — Other Ambulatory Visit: Payer: BC Managed Care – PPO

## 2021-11-23 DIAGNOSIS — R0609 Other forms of dyspnea: Secondary | ICD-10-CM

## 2021-11-23 DIAGNOSIS — I10 Essential (primary) hypertension: Secondary | ICD-10-CM

## 2021-11-23 DIAGNOSIS — I2510 Atherosclerotic heart disease of native coronary artery without angina pectoris: Secondary | ICD-10-CM

## 2021-11-23 DIAGNOSIS — I493 Ventricular premature depolarization: Secondary | ICD-10-CM

## 2021-11-23 DIAGNOSIS — E785 Hyperlipidemia, unspecified: Secondary | ICD-10-CM | POA: Diagnosis not present

## 2021-11-23 DIAGNOSIS — I5032 Chronic diastolic (congestive) heart failure: Secondary | ICD-10-CM | POA: Diagnosis not present

## 2021-11-23 DIAGNOSIS — I491 Atrial premature depolarization: Secondary | ICD-10-CM

## 2021-11-23 DIAGNOSIS — I471 Supraventricular tachycardia: Secondary | ICD-10-CM

## 2021-11-23 LAB — COMPREHENSIVE METABOLIC PANEL
ALT: 29 IU/L (ref 0–32)
AST: 19 IU/L (ref 0–40)
Albumin/Globulin Ratio: 1.8 (ref 1.2–2.2)
Albumin: 4.4 g/dL (ref 3.8–4.8)
Alkaline Phosphatase: 121 IU/L (ref 44–121)
BUN/Creatinine Ratio: 14 (ref 12–28)
BUN: 10 mg/dL (ref 8–27)
Bilirubin Total: 0.3 mg/dL (ref 0.0–1.2)
CO2: 23 mmol/L (ref 20–29)
Calcium: 9.4 mg/dL (ref 8.7–10.3)
Chloride: 104 mmol/L (ref 96–106)
Creatinine, Ser: 0.69 mg/dL (ref 0.57–1.00)
Globulin, Total: 2.5 g/dL (ref 1.5–4.5)
Glucose: 215 mg/dL — ABNORMAL HIGH (ref 70–99)
Potassium: 4.3 mmol/L (ref 3.5–5.2)
Sodium: 142 mmol/L (ref 134–144)
Total Protein: 6.9 g/dL (ref 6.0–8.5)
eGFR: 98 mL/min/{1.73_m2} (ref >59)

## 2021-11-23 LAB — LIPID PANEL
Chol/HDL Ratio: 3 ratio (ref 0.0–4.4)
Cholesterol, Total: 122 mg/dL (ref 100–199)
HDL: 41 mg/dL (ref >39)
LDL Chol Calc (NIH): 55 mg/dL (ref 0–99)
Triglycerides: 155 mg/dL — ABNORMAL HIGH (ref 0–149)
VLDL Cholesterol Cal: 26 mg/dL (ref 5–40)

## 2021-12-02 DIAGNOSIS — M1712 Unilateral primary osteoarthritis, left knee: Secondary | ICD-10-CM | POA: Diagnosis not present

## 2021-12-03 ENCOUNTER — Ambulatory Visit (INDEPENDENT_AMBULATORY_CARE_PROVIDER_SITE_OTHER): Payer: Self-pay

## 2021-12-03 DIAGNOSIS — B351 Tinea unguium: Secondary | ICD-10-CM

## 2021-12-03 NOTE — Progress Notes (Signed)
Patient presents today for the 3rd laser treatment. Diagnosed with mycotic nail infection by Dr. Paulla Dolly.   Toenail most affected right 3rd.  All other systems are negative.  Nails were filed thin. Laser therapy was administered to right 1-5 toenails  and patient tolerated the treatment well. All safety precautions were in place.    Follow up in 6 weeks for laser # 4.

## 2021-12-09 DIAGNOSIS — M1712 Unilateral primary osteoarthritis, left knee: Secondary | ICD-10-CM | POA: Diagnosis not present

## 2021-12-16 DIAGNOSIS — M170 Bilateral primary osteoarthritis of knee: Secondary | ICD-10-CM | POA: Diagnosis not present

## 2021-12-23 DIAGNOSIS — M170 Bilateral primary osteoarthritis of knee: Secondary | ICD-10-CM | POA: Diagnosis not present

## 2021-12-23 DIAGNOSIS — M1711 Unilateral primary osteoarthritis, right knee: Secondary | ICD-10-CM | POA: Diagnosis not present

## 2021-12-23 DIAGNOSIS — M1712 Unilateral primary osteoarthritis, left knee: Secondary | ICD-10-CM | POA: Diagnosis not present

## 2022-01-03 DIAGNOSIS — M1711 Unilateral primary osteoarthritis, right knee: Secondary | ICD-10-CM | POA: Diagnosis not present

## 2022-01-09 DIAGNOSIS — M25561 Pain in right knee: Secondary | ICD-10-CM | POA: Diagnosis not present

## 2022-01-13 ENCOUNTER — Other Ambulatory Visit: Payer: Self-pay | Admitting: Family Medicine

## 2022-01-13 DIAGNOSIS — Z1231 Encounter for screening mammogram for malignant neoplasm of breast: Secondary | ICD-10-CM

## 2022-01-14 ENCOUNTER — Ambulatory Visit (INDEPENDENT_AMBULATORY_CARE_PROVIDER_SITE_OTHER): Payer: BC Managed Care – PPO | Admitting: Podiatry

## 2022-01-14 ENCOUNTER — Telehealth: Payer: Self-pay | Admitting: *Deleted

## 2022-01-14 DIAGNOSIS — B351 Tinea unguium: Secondary | ICD-10-CM

## 2022-01-14 NOTE — Telephone Encounter (Signed)
Patient is unable to keep 6 week laser appointment, will have to schedule for 9 weeks, will this be ok?

## 2022-01-14 NOTE — Progress Notes (Signed)
Patient presents today for the 4th laser treatment. Diagnosed with mycotic nail infection by Dr. Paulla Dolly.    Toenail most affected right 3rd.   All other systems are negative.   Nails were filed thin. Laser therapy was administered to right 1-5 toenails  and patient tolerated the treatment well. All safety precautions were in place.      Follow up in 6 weeks for laser # 5.

## 2022-01-17 DIAGNOSIS — M25561 Pain in right knee: Secondary | ICD-10-CM | POA: Diagnosis not present

## 2022-01-18 DIAGNOSIS — I509 Heart failure, unspecified: Secondary | ICD-10-CM | POA: Diagnosis not present

## 2022-01-18 DIAGNOSIS — E1165 Type 2 diabetes mellitus with hyperglycemia: Secondary | ICD-10-CM | POA: Diagnosis not present

## 2022-01-18 DIAGNOSIS — N209 Urinary calculus, unspecified: Secondary | ICD-10-CM | POA: Diagnosis not present

## 2022-01-19 NOTE — Telephone Encounter (Signed)
Patient notified

## 2022-01-26 ENCOUNTER — Ambulatory Visit
Admission: RE | Admit: 2022-01-26 | Discharge: 2022-01-26 | Disposition: A | Discharge status: Home / Self Care | Payer: BC Managed Care – PPO | Source: Ambulatory Visit | Attending: Family Medicine | Admitting: Family Medicine

## 2022-01-26 DIAGNOSIS — Z1231 Encounter for screening mammogram for malignant neoplasm of breast: Secondary | ICD-10-CM | POA: Diagnosis not present

## 2022-01-28 ENCOUNTER — Ambulatory Visit: Payer: BC Managed Care – PPO

## 2022-02-01 DIAGNOSIS — N200 Calculus of kidney: Secondary | ICD-10-CM | POA: Diagnosis not present

## 2022-02-01 DIAGNOSIS — M10072 Idiopathic gout, left ankle and foot: Secondary | ICD-10-CM | POA: Diagnosis not present

## 2022-02-07 DIAGNOSIS — Z6841 Body Mass Index (BMI) 40.0 and over, adult: Secondary | ICD-10-CM | POA: Diagnosis not present

## 2022-02-07 DIAGNOSIS — I10 Essential (primary) hypertension: Secondary | ICD-10-CM | POA: Diagnosis not present

## 2022-02-07 DIAGNOSIS — M10072 Idiopathic gout, left ankle and foot: Secondary | ICD-10-CM | POA: Diagnosis not present

## 2022-03-10 DIAGNOSIS — Z9189 Other specified personal risk factors, not elsewhere classified: Secondary | ICD-10-CM | POA: Diagnosis not present

## 2022-03-10 DIAGNOSIS — G4733 Obstructive sleep apnea (adult) (pediatric): Secondary | ICD-10-CM | POA: Diagnosis not present

## 2022-03-10 DIAGNOSIS — F419 Anxiety disorder, unspecified: Secondary | ICD-10-CM | POA: Diagnosis not present

## 2022-03-10 DIAGNOSIS — Z1331 Encounter for screening for depression: Secondary | ICD-10-CM | POA: Diagnosis not present

## 2022-03-10 DIAGNOSIS — E1165 Type 2 diabetes mellitus with hyperglycemia: Secondary | ICD-10-CM | POA: Diagnosis not present

## 2022-03-10 DIAGNOSIS — E282 Polycystic ovarian syndrome: Secondary | ICD-10-CM | POA: Diagnosis not present

## 2022-03-11 ENCOUNTER — Other Ambulatory Visit: Payer: BC Managed Care – PPO

## 2022-03-18 ENCOUNTER — Ambulatory Visit (INDEPENDENT_AMBULATORY_CARE_PROVIDER_SITE_OTHER): Payer: BC Managed Care – PPO | Admitting: *Deleted

## 2022-03-18 DIAGNOSIS — B351 Tinea unguium: Secondary | ICD-10-CM

## 2022-03-18 MED ORDER — TERBINAFINE HCL 250 MG PO TABS: ORAL_TABLET | ORAL | 0 refills | Status: DC

## 2022-03-18 NOTE — Progress Notes (Signed)
Patient presents today for the 5th laser treatment. Diagnosed with mycotic nail infection by Dr. Paulla Dolly.    Toenail most affected is 3rd nail right    All other systems are negative.   Nails were filed thin. Laser therapy was administered to 1-5 toenails right and patient tolerated the treatment well. All safety precautions were in place.   One pass to unaffected nails on the right foot only.  She is requesting another 4 month pulse dose of lamisil, which Dr. Paulla Dolly approved and I sent in for her.     Follow up in 6 weeks for laser # 6.

## 2022-03-24 DIAGNOSIS — B379 Candidiasis, unspecified: Secondary | ICD-10-CM | POA: Diagnosis not present

## 2022-03-24 DIAGNOSIS — Z9189 Other specified personal risk factors, not elsewhere classified: Secondary | ICD-10-CM | POA: Diagnosis not present

## 2022-03-24 DIAGNOSIS — E1159 Type 2 diabetes mellitus with other circulatory complications: Secondary | ICD-10-CM | POA: Diagnosis not present

## 2022-03-25 DIAGNOSIS — Z23 Encounter for immunization: Secondary | ICD-10-CM | POA: Diagnosis not present

## 2022-03-25 DIAGNOSIS — R058 Other specified cough: Secondary | ICD-10-CM | POA: Diagnosis not present

## 2022-04-11 DIAGNOSIS — F419 Anxiety disorder, unspecified: Secondary | ICD-10-CM | POA: Diagnosis not present

## 2022-04-11 DIAGNOSIS — I10 Essential (primary) hypertension: Secondary | ICD-10-CM | POA: Diagnosis not present

## 2022-04-11 DIAGNOSIS — E1165 Type 2 diabetes mellitus with hyperglycemia: Secondary | ICD-10-CM | POA: Diagnosis not present

## 2022-04-21 DIAGNOSIS — I509 Heart failure, unspecified: Secondary | ICD-10-CM | POA: Diagnosis not present

## 2022-04-21 DIAGNOSIS — N209 Urinary calculus, unspecified: Secondary | ICD-10-CM | POA: Diagnosis not present

## 2022-04-21 DIAGNOSIS — E1165 Type 2 diabetes mellitus with hyperglycemia: Secondary | ICD-10-CM | POA: Diagnosis not present

## 2022-04-22 ENCOUNTER — Encounter: Payer: Self-pay | Admitting: Physician Assistant

## 2022-04-22 NOTE — Progress Notes (Unsigned)
Cardiology Office Note    Date:  04/25/2022   ID:  Jaime Nichols, DOB 1960-10-27, MRN [id number]  PCP:  Cari Caraway, MD  Cardiologist:  Fransico Him, MD  Electrophysiologist:  None   Chief Complaint: f/u CAD, arrhythmias  History of Present Illness:   Jaime Nichols is a 61 y.o. female with history of OSA (followed by Dr. Radford Pax), morbid obesity, HTN, chronic diastolic CHF, DM, nonsustained atrial tach, PACs, PVCs, nonobstructive CAD by cor CT 2020, aortic atherosclerosis, old granulomatous disease and fatty liver by imaging, possible asthma who presents for follow-up.  Per history review, she has a longstanding history of dyspnea with exertion and exaggerated HR response with activity even back to her 20s. Her SOB has been felt multifactorial from obesity, pectus excavatum and possible asthma, also followed by pulmonology (pulm OV 2019, refused Breo at that visit). When seen in 2019 she noted heart racing and dyspnea with minimal activity which was replicated in clinic. D-dimer was normal adjusted for age and nuclear stress test was reassuring. CXR showed borderline cardiomegaly, aortic atherosclerosis, calcified mediastinal lymph nodes, clear lungs. It was recommended she titrate Toprol but she declined as she was terrified to do so so diltiazem was added. She was counseled on dire need for weight loss. She was referred for CPX and pulmonology evaluation. CPX showed normal functional capacity with significant limitation due to severe obesity and related restrictive properties. She had an exaggerated heart rate and hypertensive response to exercise with mild oxygen desaturations to a low of 91% at peak exercise. High resolution CT scan 12/06/17 showed no evidence of interstitial lung disease. PFTs 12/2017 showed small airways obstruction with mild restriction with a low ERV, this can be seen in poor patient effort, neuromuscular weakness or obesity. Coronary CTA 08/2018 showed mild  nonobstructive CAD and a nuclear stress test in 03/2020 that was normal. She is not on ASA as it has previously tended to exacerbate her interstitial cystitis per patient report. The CT scan in 2020 did also show calcified lymph nodes compatible with old granulomatous disease.  She has worn several monitors in the past, with findings showing a combination of NSR, sinus tach, PVCs, PACs, and nonsustained atrial tach - the last of which in 04/2020 only showed <1% PVCs. Her most recent echo in 06/2021 showed EF 31-54%, normal diastolic function, normal RV function, mild LAE, no significant valvular disease. She is not on statin due to myalgias and drug interaction with colchicine - on PCSK9i instead.   She returns for follow-up today feeling well from a cardiac standpoint without recent CP, SOB, palpitations, or cardiac concerns. She is following with dermatology for a recurrent cyst on her back and is taking antibiotics for this. She has a little bit of pain associated with it and thinks this might be driving her BP up since it usually runs normal. She has f/u with Dr. Radford Pax coming up for her OSA.   Labwork independently reviewed: 11/2021 Trig 155, LDL 55, K 4.2, Cr 0.69, LFTs wnl, glu 215 07/2021 Hgb 13.7, plt 307 06/2021 AST 32, ALT 50, A1c 8 05/2017 TSH wnl 2018 Mg wnl    Cardiology Studies:   Studies reviewed are outlined and summarized above. Reports included below if pertinent.   2D echo 06/2021  1. Left ventricular ejection fraction, by estimation, is 60 to 65%. The left ventricle has normal function. The left ventricle has no regional wall motion abnormalities. Left ventricular diastolic parameters were normal.   2.  Right ventricular systolic function is normal. The right ventricular size is normal.   3. Left atrial size was mildly dilated.   4. The mitral valve is normal in structure. No evidence of mitral valve regurgitation. No evidence of mitral stenosis.   5. The aortic valve is normal  in structure. Aortic valve regurgitation is not visualized. No aortic stenosis is present.   6. The inferior vena cava is normal in size with greater than 50% respiratory variability, suggesting right atrial pressure of 3 mmHg.    Nuclear stress test 2021 There was no ST segment deviation noted during stress. The left ventricular ejection fraction is normal (55-65%). Nuclear stress EF: 64%. The study is normal. This is a low risk study   Cor CT 08/2018 FINDINGS: Non-cardiac: See separate report from [facility]. No significant findings on limited lung and soft tissue windows.   Calcium score: Calcium noted in LAD and RCA   Coronary Arteries: Right dominant with no anomalies   LM: Normal   LAD: Less than 30% calcified plaque in proximal vessel. 50% < calcified plaque in mid vessel   D1: Normal   D2: Normal   Circumflex: Normal   OM1: Normal   OM2: Normal   RCA: Less than 30% calcified plaque in proximal mid and distal vessel   PDA: Normal   PLA: Normal   IMPRESSION: 1.  Calcium score 846 which is 39 th percentile for age and sex   2.  Non obstructive CAD see description above   3.  Normal aortic root   Note would not repeat cardiac CT in future Poor quality study due to obesity and inability to reduce HR below 80 bpm despite 20 mg iv lopressor and 200 mg PO   Study not suitable for FFR CT due to poor opacification   Jenkins Rouge     Electronically Signed   By: Jenkins Rouge M.D.   On: 08/24/2018 12:41   IMPRESSION: Calcified lymph nodes compatible with old granulomatous disease.   Probable fatty infiltration of the liver.   No acute findings.   Electronically Signed: By: Rolm Baptise M.D. On: 08/24/2018 12:31    Past Medical History:  Diagnosis Date   Aortic atherosclerosis ([place])    Asthma    CAD in native artery    very high calcium score at 875.  coronary CTA  showed 30% RCA and LAD.   Chronic diastolic CHF (congestive heart  failure) ([place]) 2012   Diabetes mellitus type 2 in obese ([place])    Dyspnea    with exertion   Fatty liver    Fibroid    History of kidney stones 09/2019   HTN (hypertension)    Hyperlipidemia    Morbid obesity ([facility])    OSA on CPAP    PAT (paroxysmal atrial tachycardia)    Premature atrial contractions    PVC's (premature ventricular contractions)     Past Surgical History:  Procedure Laterality Date   ABDOMINAL HYSTERECTOMY  06/20/2008   BSO   Benign uterine polyps  01/18/2009   CYSTOSCOPY W/ URETERAL STENT PLACEMENT Right 07/02/2021   Procedure: CYSTOSCOPY WITH RETROGRADE PYELOGRAM/URETERAL STENT PLACEMENT;  Surgeon: Remi Haggard, MD;  Location: [facility];  Service: Urology;  Laterality: Right;   CYSTOSCOPY/URETEROSCOPY/HOLMIUM LASER/STENT PLACEMENT Right 07/28/2021   Procedure: RIGHT URETEROSCOPY/ RETROGRADE / HOLMIUM LASER/ STONE BASKETRY, STENT EXCHANGE;  Surgeon: Ardis Hughs, MD;  [facility]: [facility];  Service: Urology;  Laterality: Right;   DILATION AND  CURETTAGE OF UTERUS  2010   HYSTEROSCOPY  2010   URETEROSCOPY WITH HOLMIUM LASER LITHOTRIPSY Left 2021    Current Medications: Current Meds  Medication Sig   acetaminophen (TYLENOL) 500 MG tablet Take 500-1,000 mg by mouth every 6 (six) hours as needed for headache (or pain).   allopurinol (ZYLOPRIM) 300 MG tablet Take 300 mg by mouth daily.   celecoxib (CELEBREX) 200 MG capsule Take 200 mg by mouth as needed.   colchicine 0.6 MG tablet as needed.   D 1000 25 MCG (1000 UT) capsule Take 1,000 Units by mouth 3 (three) times a week.   diltiazem (CARDIZEM CD) 120 MG 24 hr capsule TAKE 1 CAPSULE BY MOUTH EVERY DAY   doxycycline (VIBRA-TABS) 100 MG tablet Take 100 mg by mouth 2 (two) times daily.   erythromycin ophthalmic ointment Place 1 Application into both eyes as needed.   estradiol (ESTRACE) 0.1 MG/GM vaginal cream Place 1 Applicatorful vaginally every 14 (fourteen) days.   Evolocumab (REPATHA  SURECLICK) 517 MG/ML SOAJ INJECT 1 PEN INTO THE SKIN EVERY 14 (FOURTEEN) DAYS.   fluconazole (DIFLUCAN) 150 MG tablet Take 150 mg by mouth as needed.   glimepiride (AMARYL) 4 MG tablet Take 2 mg by mouth every morning.   levalbuterol (XOPENEX HFA) 45 MCG/ACT inhaler Inhale 1-2 puffs into the lungs every 4 (four) hours as needed for shortness of breath.   lisinopril (ZESTRIL) 10 MG tablet Take 1 tablet (10 mg total) by mouth daily.   metFORMIN (GLUCOPHAGE-XR) 500 MG 24 hr tablet TAKE 2 TABLETS BY MOUTH TWICE A DAY WITH MEALS (Patient taking differently: Take 500 mg by mouth daily with breakfast.)   metoprolol succinate (TOPROL-XL) 25 MG 24 hr tablet Take 1 tablet (25 mg total) by mouth daily.   MOUNJARO 2.5 MG/0.5ML Pen Inject 2.5 mg into the skin once a week.   Potassium Citrate 15 MEQ (1620 MG) TBCR Take 15 mEq by mouth 3 (three) times a week.   PRESCRIPTION MEDICATION CPAP- At bedtime   terbinafine (LAMISIL) 250 MG tablet Take one tablet once a day for 1 week, then hold for 3 weeks, repeat cycle every month x 4 months   terconazole (TERAZOL 7) 0.4 % vaginal cream Place 1 applicator vaginally as needed.   tiZANidine (ZANAFLEX) 4 MG tablet Take 0.5 tablets (2 mg total) by mouth every 6 (six) hours as needed for muscle spasms.      Allergies:   Erythromycin, Latex, Nickel, Statins, Crestor [rosuvastatin], Dapagliflozin, Metformin hcl, Potassium citrate, and Zocor [simvastatin]   Social History   Socioeconomic History   Marital status: Married    Spouse name: Not on file   Number of children: Not on file   Years of education: Not on file   Highest education level: Not on file  Occupational History   Not on file  Tobacco Use   Smoking status: Never   Smokeless tobacco: Never  Vaping Use   Vaping Use: Never used  Substance and Sexual Activity   Alcohol use: Yes    Comment: occ.   Drug use: No   Sexual activity: Yes    Partners: Male    Birth control/protection: Surgical     Comment: hyst  Other Topics Concern   Not on file  Social History Narrative   Not on file   Social Determinants of Health   Financial Resource Strain: Not on file  Food Insecurity: Not on file  Transportation Needs: Not on file  Physical Activity: Not on file  Stress: Not on file  Social Connections: Not on file     Family History:  The patient's family history includes Arrhythmia in her sister and another family member; Depression in her brother; Diabetes in her paternal grandmother; Healthy in her sister and sister; Heart disease in her father; Hypertension in her mother.  ROS:   Please see the history of present illness.  All other systems are reviewed and otherwise negative.    EKG(s)/Additional Labs   EKG:  EKG is ordered today, personally reviewed, demonstrating NSR 86bpm, left axis deviation, IRBBB, anteroseptal infarct, left axis deviation, similar to prior.  Recent Labs: 08/03/2021: Hemoglobin 13.7; Platelets 307 11/23/2021: ALT 29; BUN 10; Creatinine, Ser 0.69; Potassium 4.3; Sodium 142  Recent Lipid Panel    Component Value Date/Time   CHOL 122 11/23/2021 1058   TRIG 155 (H) 11/23/2021 1058   HDL 41 11/23/2021 1058   CHOLHDL 3.0 11/23/2021 1058   LDLCALC 55 11/23/2021 1058    PHYSICAL EXAM:    VS:  BP 138/72   Pulse 86   Ht '5\' 2"'$  (1.575 m)   Wt 255 lb 9.6 oz (115.9 kg)   SpO2 97%   BMI 46.75 kg/m   BMI: Body mass index is 46.75 kg/m.  GEN: Well nourished, well developed female in no acute distress HEENT: normocephalic, atraumatic Neck: no JVD, carotid bruits, or masses Cardiac: RRR; no murmurs, rubs, or gallops, no edema  Respiratory:  clear to auscultation bilaterally, normal work of breathing GI: soft, nontender, nondistended, + BS MS: no deformity or atrophy Skin: warm and dry, no rash. Raised erythematous cyst on back, about size of small marble, without suppuration Neuro:  Alert and Oriented x 3, Strength and sensation are intact, follows  commands Psych: euthymic mood, full affect  Wt Readings from Last 3 Encounters:  04/25/22 255 lb 9.6 oz (115.9 kg)  10/11/21 260 lb (117.9 kg)  07/31/21 263 lb 0.1 oz (119.3 kg)     ASSESSMENT & PLAN:   1. Nonobstructive CAD with HLD - doing well without any symptoms of accelerating angina. She cannot take ASA as it aggravates her interstitial cystitis. She is doing well on BB/CCB. She will continue PCSK9i- reviewed last lipids from 11/2021. We will have her return before her next 6 month visit for routine fasting labs in 10/2022.  2. Chronic diastolic CHF - appears euvolemic. Has not required chronic diuretic therapy. No additional changes made today. She is now on Monjouro as well which I am hopeful will have positive effect as far as her risk of exacerbation goes.  3. Essential HTN - BP slightly above goal of <130/80. However, she feels this is unusual for her and possibly exacerbated by pain from her derm cyst. The patient was instructed to monitor their blood pressure at home and to call if tending to run higher than 762 systolic. She will continue lisinopril in the meantime. She is also on Monjouro and so we discussed that BP may be an evolving target.   4. PACs, PVCs, atrial tachycardia - quiescent on present doses. Continue diltiazem and metoprolol.     Disposition: F/u in 6 months for gen cards f/u with either me or Dr. Radford Pax. Also sees Dr. Radford Pax for OSA f/u later this month.   Medication Adjustments/Labs and Tests Ordered: Current medicines are reviewed at length with the patient today.  Concerns regarding medicines are outlined above. Medication changes, Labs and Tests ordered today are summarized above and listed in the Patient Instructions accessible  in Encounters.   Signed, Charlie Pitter, PA-C  04/25/2022 11:00 AM    [place] Phone: [phone]; Fax: [phone]

## 2022-04-25 ENCOUNTER — Ambulatory Visit: Payer: BC Managed Care – PPO | Attending: Physician Assistant | Admitting: Physician Assistant

## 2022-04-25 ENCOUNTER — Encounter: Payer: Self-pay | Admitting: Physician Assistant

## 2022-04-25 VITALS — BP 138/72 | HR 86 | Ht 62.0 in | Wt 255.6 lb

## 2022-04-25 DIAGNOSIS — I5032 Chronic diastolic (congestive) heart failure: Secondary | ICD-10-CM

## 2022-04-25 DIAGNOSIS — I4719 Other supraventricular tachycardia: Secondary | ICD-10-CM

## 2022-04-25 DIAGNOSIS — E785 Hyperlipidemia, unspecified: Secondary | ICD-10-CM

## 2022-04-25 DIAGNOSIS — L720 Epidermal cyst: Secondary | ICD-10-CM | POA: Diagnosis not present

## 2022-04-25 DIAGNOSIS — I10 Essential (primary) hypertension: Secondary | ICD-10-CM

## 2022-04-25 DIAGNOSIS — I491 Atrial premature depolarization: Secondary | ICD-10-CM

## 2022-04-25 DIAGNOSIS — I2510 Atherosclerotic heart disease of native coronary artery without angina pectoris: Secondary | ICD-10-CM

## 2022-04-25 DIAGNOSIS — I493 Ventricular premature depolarization: Secondary | ICD-10-CM

## 2022-04-25 DIAGNOSIS — L0889 Other specified local infections of the skin and subcutaneous tissue: Secondary | ICD-10-CM | POA: Diagnosis not present

## 2022-04-25 NOTE — Patient Instructions (Addendum)
Medication Instructions:   Your physician recommends that you continue on your current medications as directed. Please refer to the Current Medication list given to you today.  *If you need a refill on your cardiac medications before your next appointment, please call your pharmacy*   Lab Work:  RETURN FASTING  BY 10-2022  FOR CMET LIPIDS CBC AND TSH    If you have labs (blood work) drawn today and your tests are completely normal, you will receive your results only by: [place] (if you have MyChart) OR A paper copy in the mail If you have any lab test that is abnormal or we need to change your treatment, we will call you to review the results.   Testing/Procedures: NONE ORDERED  TODAY    Follow-Up: At [facility], you and your health needs are our priority.  As part of our continuing mission to provide you with exceptional heart care, we have created designated Provider Care Teams.  These Care Teams include your primary Cardiologist (physician) and Advanced Practice Providers (APPs -  Physician Assistants and Nurse Practitioners) who all work together to provide you with the care you need, when you need it.  We recommend signing up for the patient portal called "MyChart".  Sign up information is provided on this After Visit Summary.  MyChart is used to connect with patients for Virtual Visits (Telemedicine).  Patients are able to view lab/test results, encounter notes, upcoming appointments, etc.  Non-urgent messages can be sent to your provider as well.   To learn more about what you can do with MyChart, go to [email or web address].    Your next appointment:   6 month(s)  The format for your next appointment:   In Person  Provider:   Melina Copa, PA-C or  Fransico Him, MD will plan to see you again in 6 month(s).     Important Information About Sugar

## 2022-04-27 DIAGNOSIS — I10 Essential (primary) hypertension: Secondary | ICD-10-CM | POA: Diagnosis not present

## 2022-04-27 DIAGNOSIS — K219 Gastro-esophageal reflux disease without esophagitis: Secondary | ICD-10-CM | POA: Diagnosis not present

## 2022-04-27 DIAGNOSIS — Z6841 Body Mass Index (BMI) 40.0 and over, adult: Secondary | ICD-10-CM | POA: Diagnosis not present

## 2022-04-29 DIAGNOSIS — U071 COVID-19: Secondary | ICD-10-CM | POA: Diagnosis not present

## 2022-04-29 DIAGNOSIS — J984 Other disorders of lung: Secondary | ICD-10-CM | POA: Diagnosis not present

## 2022-05-03 ENCOUNTER — Other Ambulatory Visit: Payer: BC Managed Care – PPO

## 2022-05-04 ENCOUNTER — Ambulatory Visit: Payer: BC Managed Care – PPO | Admitting: Cardiology

## 2022-05-11 ENCOUNTER — Other Ambulatory Visit: Payer: BC Managed Care – PPO

## 2022-05-16 ENCOUNTER — Other Ambulatory Visit: Payer: Self-pay | Admitting: Cardiology

## 2022-05-19 ENCOUNTER — Ambulatory Visit (INDEPENDENT_AMBULATORY_CARE_PROVIDER_SITE_OTHER): Payer: BC Managed Care – PPO

## 2022-05-19 ENCOUNTER — Other Ambulatory Visit: Payer: BC Managed Care – PPO

## 2022-05-19 DIAGNOSIS — B351 Tinea unguium: Secondary | ICD-10-CM

## 2022-05-19 NOTE — Progress Notes (Signed)
Patient presents today for the 6th laser treatment. Diagnosed with mycotic nail infection by Dr. Paulla Dolly.    Toenail most affected is 3rd nail right    All other systems are negative.   Nails were filed thin. Laser therapy was administered to 1-5 toenails right and patient tolerated the treatment well. All safety precautions were in place.   One pass to unaffected nails on the right foot only.  Patient reports taking lamisil Rx as directed.     Patient has completed the recommended laser treatments. He will follow up with Dr. Paulla Dolly in 3 months to evaluate progress.

## 2022-05-23 DIAGNOSIS — I10 Essential (primary) hypertension: Secondary | ICD-10-CM | POA: Diagnosis not present

## 2022-05-23 DIAGNOSIS — E1165 Type 2 diabetes mellitus with hyperglycemia: Secondary | ICD-10-CM | POA: Diagnosis not present

## 2022-06-04 ENCOUNTER — Other Ambulatory Visit: Payer: Self-pay | Admitting: Cardiology

## 2022-06-04 DIAGNOSIS — I2510 Atherosclerotic heart disease of native coronary artery without angina pectoris: Secondary | ICD-10-CM

## 2022-06-04 DIAGNOSIS — E785 Hyperlipidemia, unspecified: Secondary | ICD-10-CM

## 2022-06-14 ENCOUNTER — Other Ambulatory Visit: Payer: Self-pay | Admitting: Podiatry

## 2022-06-16 NOTE — Telephone Encounter (Signed)
That is all she takes at this point

## 2022-06-17 DIAGNOSIS — N3941 Urge incontinence: Secondary | ICD-10-CM | POA: Diagnosis not present

## 2022-06-17 DIAGNOSIS — N200 Calculus of kidney: Secondary | ICD-10-CM | POA: Diagnosis not present

## 2022-06-17 NOTE — Telephone Encounter (Signed)
no

## 2022-06-22 DIAGNOSIS — I10 Essential (primary) hypertension: Secondary | ICD-10-CM | POA: Diagnosis not present

## 2022-06-22 DIAGNOSIS — E1165 Type 2 diabetes mellitus with hyperglycemia: Secondary | ICD-10-CM | POA: Diagnosis not present

## 2022-06-30 ENCOUNTER — Ambulatory Visit (INDEPENDENT_AMBULATORY_CARE_PROVIDER_SITE_OTHER): Payer: BC Managed Care – PPO

## 2022-06-30 DIAGNOSIS — B351 Tinea unguium: Secondary | ICD-10-CM

## 2022-06-30 NOTE — Progress Notes (Signed)
Patient presents today for the 7th laser treatment. Diagnosed with mycotic nail infection by Dr. Paulla Dolly.    Toenail most affected is 3rd nail right    All other systems are negative.   Nails were filed thin. Laser therapy was administered to 1-5 toenails right and patient tolerated the treatment well. All safety precautions were in place.   One pass to unaffected nails on the right foot only.  Patient reports taking lamisil Rx as directed.   Patient wanted to do 2 additional treatments before following-up with Dr. Real and I believe that would be beneficial.   Patient has completed the recommended laser treatments. He will follow up with Dr. Paulla Dolly in 3 months to evaluate progress.

## 2022-07-07 ENCOUNTER — Other Ambulatory Visit: Payer: Self-pay | Admitting: Cardiology

## 2022-07-20 ENCOUNTER — Other Ambulatory Visit (HOSPITAL_COMMUNITY): Payer: Self-pay

## 2022-07-22 ENCOUNTER — Telehealth: Payer: Self-pay

## 2022-07-22 NOTE — Telephone Encounter (Signed)
Pharmacy Patient Advocate Encounter  Prior Authorization for REPATHA 140 MG/ML INJ has been approved.    Effective dates: 07/20/22 through 07/19/23   Received notification from [facility] that prior authorization for REPATHA 140 MG/ML INJ is needed.    PA submitted on 07/20/22 Key [id number] Status is pending  Karie Soda, [place] Patient Advocate Specialist Direct Number: [phone] Fax: [phone]

## 2022-07-25 DIAGNOSIS — M19049 Primary osteoarthritis, unspecified hand: Secondary | ICD-10-CM | POA: Diagnosis not present

## 2022-07-25 DIAGNOSIS — Z6841 Body Mass Index (BMI) 40.0 and over, adult: Secondary | ICD-10-CM | POA: Diagnosis not present

## 2022-07-26 DIAGNOSIS — I10 Essential (primary) hypertension: Secondary | ICD-10-CM | POA: Diagnosis not present

## 2022-07-26 DIAGNOSIS — E1165 Type 2 diabetes mellitus with hyperglycemia: Secondary | ICD-10-CM | POA: Diagnosis not present

## 2022-08-04 DIAGNOSIS — Z6841 Body Mass Index (BMI) 40.0 and over, adult: Secondary | ICD-10-CM | POA: Diagnosis not present

## 2022-08-04 DIAGNOSIS — R509 Fever, unspecified: Secondary | ICD-10-CM | POA: Diagnosis not present

## 2022-08-04 DIAGNOSIS — J00 Acute nasopharyngitis [common cold]: Secondary | ICD-10-CM | POA: Diagnosis not present

## 2022-08-05 ENCOUNTER — Ambulatory Visit: Payer: BC Managed Care – PPO | Admitting: Cardiology

## 2022-08-11 DIAGNOSIS — M19049 Primary osteoarthritis, unspecified hand: Secondary | ICD-10-CM | POA: Diagnosis not present

## 2022-08-11 DIAGNOSIS — Z6841 Body Mass Index (BMI) 40.0 and over, adult: Secondary | ICD-10-CM | POA: Diagnosis not present

## 2022-08-11 DIAGNOSIS — E559 Vitamin D deficiency, unspecified: Secondary | ICD-10-CM | POA: Diagnosis not present

## 2022-08-11 DIAGNOSIS — E538 Deficiency of other specified B group vitamins: Secondary | ICD-10-CM | POA: Diagnosis not present

## 2022-08-11 DIAGNOSIS — E1165 Type 2 diabetes mellitus with hyperglycemia: Secondary | ICD-10-CM | POA: Diagnosis not present

## 2022-08-11 DIAGNOSIS — Z79899 Other long term (current) drug therapy: Secondary | ICD-10-CM | POA: Diagnosis not present

## 2022-08-18 DIAGNOSIS — E1165 Type 2 diabetes mellitus with hyperglycemia: Secondary | ICD-10-CM | POA: Diagnosis not present

## 2022-08-18 DIAGNOSIS — I509 Heart failure, unspecified: Secondary | ICD-10-CM | POA: Diagnosis not present

## 2022-08-18 DIAGNOSIS — N209 Urinary calculus, unspecified: Secondary | ICD-10-CM | POA: Diagnosis not present

## 2022-08-22 ENCOUNTER — Ambulatory Visit (INDEPENDENT_AMBULATORY_CARE_PROVIDER_SITE_OTHER): Payer: BC Managed Care – PPO | Admitting: Podiatry

## 2022-08-22 ENCOUNTER — Encounter: Payer: Self-pay | Admitting: Podiatry

## 2022-08-22 DIAGNOSIS — B351 Tinea unguium: Secondary | ICD-10-CM

## 2022-08-22 NOTE — Progress Notes (Signed)
Subjective:   Patient ID: Jaime Nichols, female   DOB: 62 y.o.   MRN: [id number]   HPI Patient presents stating that overall she is doing pretty well but 1 nail continues to be a problem   ROS      Objective:  Physical Exam  Neurovascular status intact third nail right continues to exhibit some thickness of the bed     Assessment:  Probability that there is some trauma to this nail bed along with the fungal issue     Plan:  H&P reviewed we may have to place her on another antifungal pack and we may need to do several more layers and ultimately she may lose this nail.  I explained all these different elements patient will be seen back after the summer I will give it a chance and see how it does but it has improved with previous treatment

## 2022-08-25 DIAGNOSIS — M170 Bilateral primary osteoarthritis of knee: Secondary | ICD-10-CM | POA: Diagnosis not present

## 2022-08-26 DIAGNOSIS — E538 Deficiency of other specified B group vitamins: Secondary | ICD-10-CM | POA: Diagnosis not present

## 2022-08-26 DIAGNOSIS — R5383 Other fatigue: Secondary | ICD-10-CM | POA: Diagnosis not present

## 2022-08-26 DIAGNOSIS — Z6841 Body Mass Index (BMI) 40.0 and over, adult: Secondary | ICD-10-CM | POA: Diagnosis not present

## 2022-08-30 DIAGNOSIS — I10 Essential (primary) hypertension: Secondary | ICD-10-CM | POA: Diagnosis not present

## 2022-08-30 DIAGNOSIS — E1165 Type 2 diabetes mellitus with hyperglycemia: Secondary | ICD-10-CM | POA: Diagnosis not present

## 2022-09-01 DIAGNOSIS — M170 Bilateral primary osteoarthritis of knee: Secondary | ICD-10-CM | POA: Diagnosis not present

## 2022-09-08 DIAGNOSIS — M170 Bilateral primary osteoarthritis of knee: Secondary | ICD-10-CM | POA: Diagnosis not present

## 2022-09-22 DIAGNOSIS — M19041 Primary osteoarthritis, right hand: Secondary | ICD-10-CM | POA: Diagnosis not present

## 2022-09-22 DIAGNOSIS — M65341 Trigger finger, right ring finger: Secondary | ICD-10-CM | POA: Diagnosis not present

## 2022-09-22 DIAGNOSIS — M19042 Primary osteoarthritis, left hand: Secondary | ICD-10-CM | POA: Diagnosis not present

## 2022-09-23 DIAGNOSIS — R5383 Other fatigue: Secondary | ICD-10-CM | POA: Diagnosis not present

## 2022-09-26 DIAGNOSIS — E538 Deficiency of other specified B group vitamins: Secondary | ICD-10-CM | POA: Diagnosis not present

## 2022-10-13 DIAGNOSIS — I10 Essential (primary) hypertension: Secondary | ICD-10-CM | POA: Diagnosis not present

## 2022-10-13 DIAGNOSIS — I509 Heart failure, unspecified: Secondary | ICD-10-CM | POA: Diagnosis not present

## 2022-10-13 DIAGNOSIS — E1169 Type 2 diabetes mellitus with other specified complication: Secondary | ICD-10-CM | POA: Diagnosis not present

## 2022-10-13 DIAGNOSIS — R638 Other symptoms and signs concerning food and fluid intake: Secondary | ICD-10-CM | POA: Diagnosis not present

## 2022-10-24 DIAGNOSIS — M19042 Primary osteoarthritis, left hand: Secondary | ICD-10-CM | POA: Diagnosis not present

## 2022-10-24 DIAGNOSIS — M19041 Primary osteoarthritis, right hand: Secondary | ICD-10-CM | POA: Diagnosis not present

## 2022-10-24 DIAGNOSIS — M65341 Trigger finger, right ring finger: Secondary | ICD-10-CM | POA: Diagnosis not present

## 2022-10-24 DIAGNOSIS — E1165 Type 2 diabetes mellitus with hyperglycemia: Secondary | ICD-10-CM | POA: Diagnosis not present

## 2022-10-24 DIAGNOSIS — I509 Heart failure, unspecified: Secondary | ICD-10-CM | POA: Diagnosis not present

## 2022-10-25 DIAGNOSIS — D225 Melanocytic nevi of trunk: Secondary | ICD-10-CM | POA: Diagnosis not present

## 2022-10-25 DIAGNOSIS — L853 Xerosis cutis: Secondary | ICD-10-CM | POA: Diagnosis not present

## 2022-10-25 DIAGNOSIS — L738 Other specified follicular disorders: Secondary | ICD-10-CM | POA: Diagnosis not present

## 2022-10-25 DIAGNOSIS — D485 Neoplasm of uncertain behavior of skin: Secondary | ICD-10-CM | POA: Diagnosis not present

## 2022-10-25 DIAGNOSIS — L918 Other hypertrophic disorders of the skin: Secondary | ICD-10-CM | POA: Diagnosis not present

## 2022-10-25 DIAGNOSIS — D2271 Melanocytic nevi of right lower limb, including hip: Secondary | ICD-10-CM | POA: Diagnosis not present

## 2022-10-31 DIAGNOSIS — E538 Deficiency of other specified B group vitamins: Secondary | ICD-10-CM | POA: Diagnosis not present

## 2022-11-01 DIAGNOSIS — M13842 Other specified arthritis, left hand: Secondary | ICD-10-CM | POA: Diagnosis not present

## 2022-11-02 ENCOUNTER — Ambulatory Visit: Payer: BC Managed Care – PPO | Attending: Physician Assistant

## 2022-11-02 DIAGNOSIS — E785 Hyperlipidemia, unspecified: Secondary | ICD-10-CM | POA: Diagnosis not present

## 2022-11-02 DIAGNOSIS — I2510 Atherosclerotic heart disease of native coronary artery without angina pectoris: Secondary | ICD-10-CM | POA: Diagnosis not present

## 2022-11-03 ENCOUNTER — Telehealth: Payer: Self-pay

## 2022-11-03 LAB — COMPREHENSIVE METABOLIC PANEL
ALT: 26 IU/L (ref 0–32)
AST: 20 IU/L (ref 0–40)
Albumin/Globulin Ratio: 1.8 (ref 1.2–2.2)
Albumin: 4.4 g/dL (ref 3.9–4.9)
Alkaline Phosphatase: 134 IU/L — ABNORMAL HIGH (ref 44–121)
BUN/Creatinine Ratio: 14 (ref 12–28)
BUN: 12 mg/dL (ref 8–27)
Bilirubin Total: 0.3 mg/dL (ref 0.0–1.2)
CO2: 27 mmol/L (ref 20–29)
Calcium: 9.5 mg/dL (ref 8.7–10.3)
Chloride: 102 mmol/L (ref 96–106)
Creatinine, Ser: 0.87 mg/dL (ref 0.57–1.00)
Globulin, Total: 2.5 g/dL (ref 1.5–4.5)
Glucose: 220 mg/dL — ABNORMAL HIGH (ref 70–99)
Potassium: 4.5 mmol/L (ref 3.5–5.2)
Sodium: 141 mmol/L (ref 134–144)
Total Protein: 6.9 g/dL (ref 6.0–8.5)
eGFR: 75 mL/min/{1.73_m2} (ref >59)

## 2022-11-03 LAB — LIPID PANEL
Chol/HDL Ratio: 3.1 ratio (ref 0.0–4.4)
Cholesterol, Total: 142 mg/dL (ref 100–199)
HDL: 46 mg/dL (ref >39)
LDL Chol Calc (NIH): 67 mg/dL (ref 0–99)
Triglycerides: 171 mg/dL — ABNORMAL HIGH (ref 0–149)
VLDL Cholesterol Cal: 29 mg/dL (ref 5–40)

## 2022-11-03 LAB — CBC
Hematocrit: 43.7 % (ref 34.0–46.6)
Hemoglobin: 14.1 g/dL (ref 11.1–15.9)
MCH: 28.7 pg (ref 26.6–33.0)
MCHC: 32.3 g/dL (ref 31.5–35.7)
MCV: 89 fL (ref 79–97)
Platelets: 285 10*3/uL (ref 150–450)
RBC: 4.92 x10E6/uL (ref 3.77–5.28)
RDW: 13.1 % (ref 11.7–15.4)
WBC: 8.4 10*3/uL (ref 3.4–10.8)

## 2022-11-03 LAB — TSH: TSH: 1.87 u[IU]/mL (ref 0.450–4.500)

## 2022-11-07 DIAGNOSIS — R300 Dysuria: Secondary | ICD-10-CM | POA: Diagnosis not present

## 2022-11-07 DIAGNOSIS — N200 Calculus of kidney: Secondary | ICD-10-CM | POA: Diagnosis not present

## 2022-11-07 DIAGNOSIS — E538 Deficiency of other specified B group vitamins: Secondary | ICD-10-CM | POA: Diagnosis not present

## 2022-11-07 DIAGNOSIS — E559 Vitamin D deficiency, unspecified: Secondary | ICD-10-CM | POA: Diagnosis not present

## 2022-11-11 DIAGNOSIS — R300 Dysuria: Secondary | ICD-10-CM | POA: Diagnosis not present

## 2022-11-11 DIAGNOSIS — E559 Vitamin D deficiency, unspecified: Secondary | ICD-10-CM | POA: Diagnosis not present

## 2022-11-11 DIAGNOSIS — N200 Calculus of kidney: Secondary | ICD-10-CM | POA: Diagnosis not present

## 2022-11-11 DIAGNOSIS — E538 Deficiency of other specified B group vitamins: Secondary | ICD-10-CM | POA: Diagnosis not present

## 2022-11-14 NOTE — Progress Notes (Signed)
Cardiology Office Note    Date:  11/16/2022   ID:  Jaime Nichols, DOB 05-05-1961, MRN [id number]  PCP:  Gweneth Dimitri, MD  Cardiologist:  Armanda Magic, MD  Electrophysiologist:  None   Chief Complaint: f/u arrhythmia, HF, CAD  History of Present Illness:   Jaime Nichols is a 62 y.o. female with history of OSA (followed by Dr. Mayford Knife), morbid obesity, HTN, chronic diastolic CHF, DM, nonsustained atrial tach, PACs, PVCs, nonobstructive CAD by cor CT 2020, aortic atherosclerosis, old granulomatous disease and fatty liver by imaging, possible asthma who presents for follow-up.   Per history, she has a longstanding history of dyspnea with exertion and exaggerated HR response with activity even back to her 20s. Her SOB has been felt multifactorial from obesity, pectus excavatum and possible asthma, also followed by pulmonology (pulm OV 2019, refused Breo at that visit). When seen in 2019 she noted heart racing and dyspnea with minimal activity which was replicated in clinic. D-dimer was normal adjusted for age and nuclear stress test was reassuring. CXR showed borderline cardiomegaly, aortic atherosclerosis, calcified mediastinal lymph nodes, clear lungs. It was recommended she titrate Toprol but she declined as she was terrified to do so so diltiazem was added. She was counseled on dire need for weight loss. She was referred for CPX and pulmonology evaluation. CPX showed normal functional capacity with significant limitation due to severe obesity and related restrictive properties. She had an exaggerated heart rate and hypertensive response to exercise with mild oxygen desaturations to a low of 91% at peak exercise. High resolution CT scan 11/2017 showed no evidence of interstitial lung disease. PFTs 12/2017 showed small airways obstruction with mild restriction with a low ERV, this can be seen in poor patient effort, neuromuscular weakness or obesity. Coronary CTA 08/2018 showed mild  nonobstructive CAD and a nuclear stress test in 03/2020 that was normal. She is not on ASA as it has previously tended to exacerbate her interstitial cystitis per patient report. She has not previously been able to tolerate SGLT2i per notes. Also has periodic abx, fungal rx. The CT scan in 2020 did also show calcified lymph nodes compatible with old granulomatous disease.  She has worn several monitors in the past, with findings showing a combination of NSR, sinus tach, PVCs, PACs, and nonsustained atrial tach - the last of which in 04/2020 only showed <1% PVCs. Her most recent echo in 06/2021 showed EF 60-65%, normal diastolic function, normal RV function, mild LAE, no significant valvular disease. She is not on statin due to myalgias and drug interaction with colchicine - on PCSK9i instead.   She is seen for follow-up today largely doing well from cardiac standpoint without CP or new SOB from baseline. She is down about 6lb from last year. She reports an infrequent pain that occurs in a specific location marked with a pen dot today on her anterolateral right leg. It occurs about once a month for the last 10 months and usually lasts a brief period of time but in a previous month lasted hours, coming and going. It is not worse with exertion. There is no acute redness or erythema.    Labwork independently reviewed: 10/2022 TSH, CBC wnl, LDL 67, trig 171, K 4.5, Cr 0.87, LFTs OK 2018 Mg wnl  Past History   Past Medical History:  Diagnosis Date   Aortic atherosclerosis (HCC)    Asthma    CAD in native artery    very high calcium score at 875.  coronary CTA  showed 30% RCA and LAD.   Chronic diastolic CHF (congestive heart failure) (HCC) 2012   Diabetes mellitus type 2 in obese    Dyspnea    with exertion   Fatty liver    Fibroid    History of kidney stones 09/2019   HTN (hypertension)    Hyperlipidemia    Morbid obesity (HCC)    OSA on CPAP    PAT (paroxysmal atrial tachycardia)    Premature  atrial contractions    PVC's (premature ventricular contractions)     Past Surgical History:  Procedure Laterality Date   ABDOMINAL HYSTERECTOMY  06/20/2008   BSO   Benign uterine polyps  01/18/2009   CYSTOSCOPY W/ URETERAL STENT PLACEMENT Right 07/02/2021   Procedure: CYSTOSCOPY WITH RETROGRADE PYELOGRAM/URETERAL STENT PLACEMENT;  Surgeon: Belva Agee, MD;  Location: [facility];  Service: Urology;  Laterality: Right;   CYSTOSCOPY/URETEROSCOPY/HOLMIUM LASER/STENT PLACEMENT Right 07/28/2021   Procedure: RIGHT URETEROSCOPY/ RETROGRADE / HOLMIUM LASER/ STONE BASKETRY, STENT EXCHANGE;  Surgeon: Crist Fat, MD;  Location: [facility];  Service: Urology;  Laterality: Right;   DILATION AND CURETTAGE OF UTERUS  2010   HYSTEROSCOPY  2010   URETEROSCOPY WITH HOLMIUM LASER LITHOTRIPSY Left 2021    Current Medications: Current Meds  Medication Sig   acetaminophen (TYLENOL) 500 MG tablet Take 500-1,000 mg by mouth every 6 (six) hours as needed for headache (or pain).   allopurinol (ZYLOPRIM) 300 MG tablet Take 300 mg by mouth daily.   celecoxib (CELEBREX) 200 MG capsule Take 200 mg by mouth as needed.   colchicine 0.6 MG tablet as needed.   D 1000 25 MCG (1000 UT) capsule Take 1,000 Units by mouth 3 (three) times a week.   diltiazem (CARDIZEM CD) 120 MG 24 hr capsule TAKE 1 CAPSULE BY MOUTH EVERY DAY   doxycycline (VIBRA-TABS) 100 MG tablet Take 100 mg by mouth 2 (two) times daily.   erythromycin ophthalmic ointment Place 1 Application into both eyes as needed.   estradiol (ESTRACE) 0.1 MG/GM vaginal cream Place 1 Applicatorful vaginally every 14 (fourteen) days.   Evolocumab (REPATHA SURECLICK) 140 MG/ML SOAJ INJECT 1 PEN INTO THE SKIN EVERY 14 (FOURTEEN) DAYS.   fluconazole (DIFLUCAN) 150 MG tablet Take 150 mg by mouth as needed.   glimepiride (AMARYL) 4 MG tablet Take 2 mg by mouth every morning.   levalbuterol (XOPENEX HFA) 45 MCG/ACT inhaler Inhale 1-2 puffs into the  lungs every 4 (four) hours as needed for shortness of breath.   lisinopril (ZESTRIL) 10 MG tablet Take 1 tablet (10 mg total) by mouth daily.   magnesium citrate solution Take 296 mLs by mouth once.   magnesium gluconate (MAGONATE) 500 MG tablet Take 500 mg by mouth 2 (two) times daily.   metFORMIN (GLUCOPHAGE-XR) 500 MG 24 hr tablet Take 500 mg by mouth daily with breakfast.   metoprolol succinate (TOPROL-XL) 25 MG 24 hr tablet TAKE 1 TABLET (25 MG TOTAL) BY MOUTH DAILY.   nitrofurantoin, macrocrystal-monohydrate, (MACROBID) 100 MG capsule Take 100 mg by mouth 2 (two) times daily. 4 to 5 days   Potassium Citrate 15 MEQ (1620 MG) TBCR Take 15 mEq by mouth 3 (three) times a week.   PRESCRIPTION MEDICATION CPAP- At bedtime   Taurine 1000 MG CAPS Take 100 mg by mouth in the morning, at noon, and at bedtime.   terbinafine (LAMISIL) 250 MG tablet Take one tablet once a day for 1 week, then hold for 3 weeks,  repeat cycle every month x 4 months   terconazole (TERAZOL 7) 0.4 % vaginal cream Place 1 applicator vaginally as needed.   tirzepatide (MOUNJARO) 12.5 MG/0.5ML Pen Inject 2.5 mg into the skin once a week.   tiZANidine (ZANAFLEX) 4 MG tablet Take 0.5 tablets (2 mg total) by mouth every 6 (six) hours as needed for muscle spasms.   [DISCONTINUED] metFORMIN (GLUCOPHAGE-XR) 500 MG 24 hr tablet TAKE 2 TABLETS BY MOUTH TWICE A DAY WITH MEALS (Patient taking differently: Take 500 mg by mouth daily with breakfast.)      Allergies:   Erythromycin, Latex, Nickel, Statins, Crestor [rosuvastatin], Dapagliflozin, Metformin hcl, Potassium citrate, and Zocor [simvastatin]   Social History   Socioeconomic History   Marital status: Married    Spouse name: Not on file   Number of children: Not on file   Years of education: Not on file   Highest education level: Not on file  Occupational History   Not on file  Tobacco Use   Smoking status: Never   Smokeless tobacco: Never  Vaping Use   Vaping Use: Never  used  Substance and Sexual Activity   Alcohol use: Yes    Comment: occ.   Drug use: No   Sexual activity: Yes    Partners: Male    Birth control/protection: Surgical    Comment: hyst  Other Topics Concern   Not on file  Social History Narrative   Not on file   Social Determinants of Health   Financial Resource Strain: Not on file  Food Insecurity: Not on file  Transportation Needs: Not on file  Physical Activity: Not on file  Stress: Not on file  Social Connections: Not on file     Family History:  The patient's family history includes Arrhythmia in her sister and another family member; Depression in her brother; Diabetes in her paternal grandmother; Healthy in her sister and sister; Heart disease in her father; Hypertension in her mother.  ROS:   Please see the history of present illness.  All other systems are reviewed and otherwise negative.    EKG(s)/Additional Testing   EKG:  EKG is ordered today, personally reviewed, demonstrating NSR 88bpm IRBBB poor R wave progression similar to prior no acute change  CV Studies: Cardiac studies reviewed are outlined and summarized above. Otherwise please see EMR for full report.  Recent Labs: 11/02/2022: ALT 26; BUN 12; Creatinine, Ser 0.87; Hemoglobin 14.1; Platelets 285; Potassium 4.5; Sodium 141; TSH 1.870  Recent Lipid Panel    Component Value Date/Time   CHOL 142 11/02/2022 1029   TRIG 171 (H) 11/02/2022 1029   HDL 46 11/02/2022 1029   CHOLHDL 3.1 11/02/2022 1029   LDLCALC 67 11/02/2022 1029    PHYSICAL EXAM:    VS:  BP 114/80   Pulse 88   Ht 5\' 3"  (1.6 m)   Wt 254 lb 12.8 oz (115.6 kg)   SpO2 97%   BMI 45.14 kg/m   BMI: Body mass index is 45.14 kg/m.  GEN: Well nourished, well developed female in no acute distress HEENT: normocephalic, atraumatic Neck: no JVD, carotid bruits, or masses Cardiac: RRR; no murmurs, rubs, or gallops, no edema  Respiratory:  clear to auscultation bilaterally, normal work of  breathing GI: soft, nontender, nondistended, + BS MS: no deformity or atrophy. No focal abnormality at site of pen mark Skin: warm and dry, no rash Neuro:  Alert and Oriented x 3, Strength and sensation are intact, follows commands Psych: euthymic mood,  full affect  Wt Readings from Last 3 Encounters:  11/16/22 254 lb 12.8 oz (115.6 kg)  04/25/22 255 lb 9.6 oz (115.9 kg)  10/11/21 260 lb (117.9 kg)     ASSESSMENT & PLAN:   1. Nonobstructive CAD with HLD - no recent angina. Not on ASA for reasons above. Continue BB, Repatha.  2. Chronic diastolic CHF - appears euvolemic on exam. Agree with continuation of GLP-1. She has not seen as much weight loss as she'd hoped though dose is being adjusted. Did not previously tolerate SGLT2i per notes. She has not required standing diuretic.  3. Essential HTN - controlled on present regimen, no changes made today.   4. PACs, PVCs, atrial tachycardia - doing well on BB + diltiazem, continue. Denies needing refills.  5. Elevated alk phos level - recheck today.  6. Focal R leg pain - seems more MSK vs neuropathic, very focal, intermittent and mostly fleeting about 1x/month for the last 10 months. Exam and symptoms not c/w VTE or claudication. Advised to f/u PCP. If symptoms worsen or change, can revisit investigation.    Disposition: F/u with Dr. Mayford Knife 11/2022 for overdue OSA f/u at which time timeframe for next follow-up visit can be discussed.   Medication Adjustments/Labs and Tests Ordered: Current medicines are reviewed at length with the patient today.  Concerns regarding medicines are outlined above. Medication changes, Labs and Tests ordered today are summarized above and listed in the Patient Instructions accessible in Encounters.   Signed, Laurann Montana, PA-C  11/16/2022 4:48 PM    [facility] Phone: [phone]; Fax: [phone]

## 2022-11-15 NOTE — Telephone Encounter (Signed)
Spoke with patient to answer question about labs and advise she discuss further at her appointment with Ronie Spies, PA on 11/16/22. Patient verbalized understanding and had no questions.

## 2022-11-15 NOTE — Telephone Encounter (Signed)
Pt calling back for an answer about this. She states if she does not answer please leave a vm with answer since her appt is tomorrow.

## 2022-11-16 ENCOUNTER — Ambulatory Visit: Payer: BC Managed Care – PPO | Attending: Physician Assistant | Admitting: Physician Assistant

## 2022-11-16 ENCOUNTER — Encounter: Payer: Self-pay | Admitting: Physician Assistant

## 2022-11-16 VITALS — BP 114/80 | HR 88 | Ht 63.0 in | Wt 254.8 lb

## 2022-11-16 DIAGNOSIS — E785 Hyperlipidemia, unspecified: Secondary | ICD-10-CM | POA: Diagnosis not present

## 2022-11-16 DIAGNOSIS — M79604 Pain in right leg: Secondary | ICD-10-CM

## 2022-11-16 DIAGNOSIS — I2510 Atherosclerotic heart disease of native coronary artery without angina pectoris: Secondary | ICD-10-CM | POA: Diagnosis not present

## 2022-11-16 DIAGNOSIS — I10 Essential (primary) hypertension: Secondary | ICD-10-CM | POA: Diagnosis not present

## 2022-11-16 DIAGNOSIS — I5032 Chronic diastolic (congestive) heart failure: Secondary | ICD-10-CM | POA: Diagnosis not present

## 2022-11-16 DIAGNOSIS — R748 Abnormal levels of other serum enzymes: Secondary | ICD-10-CM

## 2022-11-16 DIAGNOSIS — I491 Atrial premature depolarization: Secondary | ICD-10-CM

## 2022-11-16 DIAGNOSIS — I4719 Other supraventricular tachycardia: Secondary | ICD-10-CM

## 2022-11-16 DIAGNOSIS — I493 Ventricular premature depolarization: Secondary | ICD-10-CM

## 2022-11-16 NOTE — Patient Instructions (Signed)
Medication Instructions:  Your physician recommends that you continue on your current medications as directed. Please refer to the Current Medication list given to you today.  *If you need a refill on your cardiac medications before your next appointment, please call your pharmacy*  Lab Work: TODAY: alkaline phosphate If you have labs (blood work) drawn today and your tests are completely normal, you will receive your results only by: MyChart Message (if you have MyChart) OR A paper copy in the mail If you have any lab test that is abnormal or we need to change your treatment, we will call you to review the results.  Testing/Procedures: None ordered today.  Follow-Up: At [facility], you and your health needs are our priority.  As part of our continuing mission to provide you with exceptional heart care, we have created designated Provider Care Teams.  These Care Teams include your primary Cardiologist (physician) and Advanced Practice Providers (APPs -  Physician Assistants and Nurse Practitioners) who all work together to provide you with the care you need, when you need it.  Your next appointment:   12/08/2022 at 11:20 AM  The format for your next appointment:   In Person  Provider:   Armanda Magic, MD {

## 2022-11-17 ENCOUNTER — Telehealth: Payer: Self-pay | Admitting: Physician Assistant

## 2022-11-17 LAB — ALKALINE PHOSPHATASE: Alkaline Phosphatase: 134 IU/L — ABNORMAL HIGH (ref 44–121)

## 2022-11-17 NOTE — Telephone Encounter (Signed)
Patient is returning LPN's call for her lab results.   Please advise.

## 2022-11-17 NOTE — Telephone Encounter (Signed)
Completed, see result note 

## 2022-11-22 DIAGNOSIS — M19041 Primary osteoarthritis, right hand: Secondary | ICD-10-CM | POA: Diagnosis not present

## 2022-11-22 DIAGNOSIS — M65341 Trigger finger, right ring finger: Secondary | ICD-10-CM | POA: Diagnosis not present

## 2022-11-22 DIAGNOSIS — M19042 Primary osteoarthritis, left hand: Secondary | ICD-10-CM | POA: Diagnosis not present

## 2022-11-23 DIAGNOSIS — R748 Abnormal levels of other serum enzymes: Secondary | ICD-10-CM | POA: Diagnosis not present

## 2022-11-23 DIAGNOSIS — M8560 Other cyst of bone, unspecified site: Secondary | ICD-10-CM | POA: Diagnosis not present

## 2022-11-23 DIAGNOSIS — M792 Neuralgia and neuritis, unspecified: Secondary | ICD-10-CM | POA: Diagnosis not present

## 2022-12-02 DIAGNOSIS — R350 Frequency of micturition: Secondary | ICD-10-CM | POA: Diagnosis not present

## 2022-12-05 ENCOUNTER — Ambulatory Visit: Payer: BC Managed Care – PPO | Admitting: Podiatry

## 2022-12-07 ENCOUNTER — Ambulatory Visit: Payer: BC Managed Care – PPO | Admitting: Podiatry

## 2022-12-08 ENCOUNTER — Encounter: Payer: Self-pay | Admitting: Cardiology

## 2022-12-08 ENCOUNTER — Ambulatory Visit: Payer: BC Managed Care – PPO | Attending: Cardiology | Admitting: Cardiology

## 2022-12-08 VITALS — BP 120/70 | HR 99 | Ht 62.75 in | Wt 254.0 lb

## 2022-12-08 DIAGNOSIS — I2510 Atherosclerotic heart disease of native coronary artery without angina pectoris: Secondary | ICD-10-CM

## 2022-12-08 DIAGNOSIS — G4733 Obstructive sleep apnea (adult) (pediatric): Secondary | ICD-10-CM | POA: Diagnosis not present

## 2022-12-08 DIAGNOSIS — E785 Hyperlipidemia, unspecified: Secondary | ICD-10-CM

## 2022-12-08 DIAGNOSIS — E661 Drug-induced obesity: Secondary | ICD-10-CM

## 2022-12-08 DIAGNOSIS — I4719 Other supraventricular tachycardia: Secondary | ICD-10-CM | POA: Diagnosis not present

## 2022-12-08 DIAGNOSIS — Z6841 Body Mass Index (BMI) 40.0 and over, adult: Secondary | ICD-10-CM

## 2022-12-08 DIAGNOSIS — I10 Essential (primary) hypertension: Secondary | ICD-10-CM | POA: Diagnosis not present

## 2022-12-08 DIAGNOSIS — I5032 Chronic diastolic (congestive) heart failure: Secondary | ICD-10-CM | POA: Diagnosis not present

## 2022-12-08 NOTE — Patient Instructions (Signed)
Medication Instructions:  Your physician recommends that you continue on your current medications as directed. Please refer to the Current Medication list given to you today.  *If you need a refill on your cardiac medications before your next appointment, please call your pharmacy*   Lab Work: None.  If you have labs (blood work) drawn today and your tests are completely normal, you will receive your results only by: MyChart Message (if you have MyChart) OR A paper copy in the mail If you have any lab test that is abnormal or we need to change your treatment, we will call you to review the results.   Testing/Procedures: None.   Follow-Up: At [facility], you and your health needs are our priority.  As part of our continuing mission to provide you with exceptional heart care, we have created designated Provider Care Teams.  These Care Teams include your primary Cardiologist (physician) and Advanced Practice Providers (APPs -  Physician Assistants and Nurse Practitioners) who all work together to provide you with the care you need, when you need it.  We recommend signing up for the patient portal called "MyChart".  Sign up information is provided on this After Visit Summary.  MyChart is used to connect with patients for Virtual Visits (Telemedicine).  Patients are able to view lab/test results, encounter notes, upcoming appointments, etc.  Non-urgent messages can be sent to your provider as well.   To learn more about what you can do with MyChart, go to [email or web address].    Your next appointment:   1 year(s)  Provider:   Traci Turner, MD     

## 2022-12-08 NOTE — Progress Notes (Signed)
Cardiology Office Note:    Date:  12/08/2022   ID:  Jaime Nichols, DOB 1961-02-03, MRN [id number]  PCP:  Gweneth Dimitri, MD  Cardiologist:  Armanda Magic, MD    Referring MD: Gweneth Dimitri, MD   Chief Complaint  Patient presents with   Coronary Artery Disease   Hypertension   Hyperlipidemia   Sleep Apnea     History of Present Illness:    Jaime Nichols is a 62 y.o. female with a hx of OSA on CPAP, obesity, chronic diastolic CHF, PAT and HTN. She has chronic SOB that is multifactorial from obesity, pectus excavatum and possible asthma, followed by Dr. Army Melia.   She is here today for followup and is doing well.  She has chronic DOE related to deconditioning and morbid obesity. She denies any chest pain or pressure, PND, orthopnea, LE edema, dizziness, palpitations or syncope. She is compliant with her meds and is tolerating meds with no SE.    She is doing well with her PAP device and thinks that she has gotten used to it.  She tolerates the mask and feels the pressure is adequate.  Since going on PAP she feels rested in the am and has no significant daytime sleepiness if she slept well the night before.  She has had more fatigue than normal recently due to low Vit D.  She denies any significant mouth or nasal dryness or nasal congestion.  She does not think that he snores.    Past Medical History:  Diagnosis Date   Aortic atherosclerosis (HCC)    Asthma    CAD in native artery    very high calcium score at 875.  coronary CTA  showed 30% RCA and LAD.   Chronic diastolic CHF (congestive heart failure) (HCC) 2012   Diabetes mellitus type 2 in obese    Dyspnea    with exertion   Fatty liver    Fibroid    History of kidney stones 09/2019   HTN (hypertension)    Hyperlipidemia    Morbid obesity (HCC)    OSA on CPAP    PAT (paroxysmal atrial tachycardia)    Premature atrial contractions    PVC's (premature ventricular contractions)     Past Surgical History:   Procedure Laterality Date   ABDOMINAL HYSTERECTOMY  06/20/2008   BSO   Benign uterine polyps  01/18/2009   CYSTOSCOPY W/ URETERAL STENT PLACEMENT Right 07/02/2021   Procedure: CYSTOSCOPY WITH RETROGRADE PYELOGRAM/URETERAL STENT PLACEMENT;  Surgeon: Belva Agee, MD;  Location: [facility];  Service: Urology;  Laterality: Right;   CYSTOSCOPY/URETEROSCOPY/HOLMIUM LASER/STENT PLACEMENT Right 07/28/2021   Procedure: RIGHT URETEROSCOPY/ RETROGRADE / HOLMIUM LASER/ STONE BASKETRY, STENT EXCHANGE;  Surgeon: Crist Fat, MD;  Location: [facility];  Service: Urology;  Laterality: Right;   DILATION AND CURETTAGE OF UTERUS  2010   HYSTEROSCOPY  2010   URETEROSCOPY WITH HOLMIUM LASER LITHOTRIPSY Left 2021    Current Medications: Current Meds  Medication Sig   acetaminophen (TYLENOL) 500 MG tablet Take 500-1,000 mg by mouth every 6 (six) hours as needed for headache (or pain).   allopurinol (ZYLOPRIM) 300 MG tablet Take 300 mg by mouth daily.   celecoxib (CELEBREX) 200 MG capsule Take 200 mg by mouth as needed.   colchicine 0.6 MG tablet as needed.   D 1000 25 MCG (1000 UT) capsule Take 1,000 Units by mouth 3 (three) times a week.   diltiazem (CARDIZEM CD) 120 MG 24 hr capsule TAKE  1 CAPSULE BY MOUTH EVERY DAY   erythromycin ophthalmic ointment Place 1 Application into both eyes as needed.   estradiol (ESTRACE) 0.1 MG/GM vaginal cream Place 1 Applicatorful vaginally every 14 (fourteen) days.   Evolocumab (REPATHA SURECLICK) 140 MG/ML SOAJ INJECT 1 PEN INTO THE SKIN EVERY 14 (FOURTEEN) DAYS.   fluconazole (DIFLUCAN) 150 MG tablet Take 150 mg by mouth as needed.   glimepiride (AMARYL) 4 MG tablet Take 2 mg by mouth every morning.   levalbuterol (XOPENEX HFA) 45 MCG/ACT inhaler Inhale 1-2 puffs into the lungs every 4 (four) hours as needed for shortness of breath.   lisinopril (ZESTRIL) 10 MG tablet Take 1 tablet (10 mg total) by mouth daily.   magnesium gluconate (MAGONATE) 500  MG tablet Take 500 mg by mouth 2 (two) times daily.   metFORMIN (GLUCOPHAGE-XR) 500 MG 24 hr tablet Take 500 mg by mouth daily with breakfast.   metoprolol succinate (TOPROL-XL) 25 MG 24 hr tablet TAKE 1 TABLET (25 MG TOTAL) BY MOUTH DAILY.   nitrofurantoin, macrocrystal-monohydrate, (MACROBID) 100 MG capsule Take 100 mg by mouth 2 (two) times daily. 4 to 5 days   Potassium Citrate 15 MEQ (1620 MG) TBCR Take 15 mEq by mouth 3 (three) times a week.   PRESCRIPTION MEDICATION CPAP- At bedtime   sulfamethoxazole-trimethoprim (BACTRIM DS) 800-160 MG tablet Take 1 tablet by mouth 2 (two) times daily.   Taurine 1000 MG CAPS Take 100 mg by mouth in the morning, at noon, and at bedtime.   terbinafine (LAMISIL) 250 MG tablet Take one tablet once a day for 1 week, then hold for 3 weeks, repeat cycle every month x 4 months   terconazole (TERAZOL 7) 0.4 % vaginal cream Place 1 applicator vaginally as needed.   tirzepatide (MOUNJARO) 12.5 MG/0.5ML Pen Inject 2.5 mg into the skin once a week.   tiZANidine (ZANAFLEX) 4 MG tablet Take 0.5 tablets (2 mg total) by mouth every 6 (six) hours as needed for muscle spasms.   [DISCONTINUED] doxycycline (VIBRA-TABS) 100 MG tablet Take 100 mg by mouth 2 (two) times daily.   [DISCONTINUED] magnesium citrate solution Take 296 mLs by mouth once.     Allergies:   Erythromycin, Latex, Nickel, Statins, Crestor [rosuvastatin], Dapagliflozin, Metformin hcl, Potassium citrate, and Zocor [simvastatin]   Social History   Socioeconomic History   Marital status: Married    Spouse name: Not on file   Number of children: Not on file   Years of education: Not on file   Highest education level: Not on file  Occupational History   Not on file  Tobacco Use   Smoking status: Never   Smokeless tobacco: Never  Vaping Use   Vaping Use: Never used  Substance and Sexual Activity   Alcohol use: Yes    Comment: occ.   Drug use: No   Sexual activity: Yes    Partners: Male    Birth  control/protection: Surgical    Comment: hyst  Other Topics Concern   Not on file  Social History Narrative   Not on file   Social Determinants of Health   Financial Resource Strain: Not on file  Food Insecurity: Not on file  Transportation Needs: Not on file  Physical Activity: Not on file  Stress: Not on file  Social Connections: Not on file     Family History: The patient's family history includes Arrhythmia in her sister and another family member; Depression in her brother; Diabetes in her paternal grandmother; Healthy in her  sister and sister; Heart disease in her father; Hypertension in her mother.  ROS:   Please see the history of present illness.    ROS  All other systems reviewed and negative.   EKGs/Labs/Other Studies Reviewed:    The following studies were reviewed today: PAP compliance download  EKG:  EKG is not ordered today.   Recent Labs: 11/02/2022: ALT 26; BUN 12; Creatinine, Ser 0.87; Hemoglobin 14.1; Platelets 285; Potassium 4.5; Sodium 141; TSH 1.870   Recent Lipid Panel    Component Value Date/Time   CHOL 142 11/02/2022 1029   TRIG 171 (H) 11/02/2022 1029   HDL 46 11/02/2022 1029   CHOLHDL 3.1 11/02/2022 1029   LDLCALC 67 11/02/2022 1029    Physical Exam:    VS:  BP 120/70   Pulse 99   Ht 5' 2.75" (1.594 m)   Wt 254 lb (115.2 kg)   SpO2 97%   BMI 45.35 kg/m     Wt Readings from Last 3 Encounters:  12/08/22 254 lb (115.2 kg)  11/16/22 254 lb 12.8 oz (115.6 kg)  04/25/22 255 lb 9.6 oz (115.9 kg)    GEN: Well nourished, well developed in no acute distress HEENT: Normal NECK: No JVD; No carotid bruits LYMPHATICS: No lymphadenopathy CARDIAC:RRR, no murmurs, rubs, gallops RESPIRATORY:  Clear to auscultation without rales, wheezing or rhonchi  ABDOMEN: Soft, non-tender, non-distended MUSCULOSKELETAL:  No edema; No deformity  SKIN: Warm and dry NEUROLOGIC:  Alert and oriented x 3 PSYCHIATRIC:  Normal affect  ASSESSMENT:    1. Chronic  diastolic CHF (congestive heart failure) (HCC)   2. Essential hypertension   3. PAT (paroxysmal atrial tachycardia)   4. OSA (obstructive sleep apnea)   5. CAD in native artery   6. Hyperlipidemia LDL goal <70    PLAN:    In order of problems listed above:  1.  Chronic diastolic CHF  -She does not have volume overload on exam today  -she has chronic SOB related to allergies, Obesity and asthma  -I think a large part of her DOE is related to obesity and deconditioning -her weight is stable and has actually lost 7 lbs since the summer -BNP on 03/23/2020 done for shortness of breath was normal   2.  Hypertension  -BP is controlled on exam -Continue prescription drug management with Cardizem CD 120 mg daily, lisinopril 10 mg daily, Toprol-XL 25 mg daily as needed refills -I have personally reviewed and interpreted outside labs performed by patient's PCP which showed serum creatinine 0.87 and potassium 4.5 on 11/02/2022   3.  PAT  -Repeat event monitor 04/2020 showed occasional PVCs -She denies any recent palpitations -Continue prescription management Cardizem CD 120 mg daily Toprol-XL 25 mg daily with as needed refills   4.  OSA - The patient is tolerating PAP therapy well without any problems. The PAP download performed by his DME was personally reviewed and interpreted by me today and showed an AHI of 0.4/hr on 12.6 cm H2O with 97% compliance in using more than 4 hours nightly.  The patient has been using and benefiting from PAP use and will continue to benefit from therapy.   5. ASCAD -coronary CTA  showed 30% RCA and LAD.   -She has not had any anginal symptoms since I saw her last -Continue prescription management with Toprol-XL 25 mg daily, Repatha -She is statin intolerant -Nuclear stress test 2019 and 2021 showed no ischemia   6.  Hyperlipidemia  -her LDL goal is less  than 70.   -Continue prescription drug management with Repatha as needed refill  -I have personally reviewed  and interpreted outside labs performed by patient's PCP which showed LDL 67 and HDL 46 on 11/02/2022  7.  Class 3 Morbid Obesity with BMI 45 and comorbidities including OSA and HTN -encouraged exercise program along with mediterranean diet    Medication Adjustments/Labs and Tests Ordered: Current medicines are reviewed at length with the patient today.  Concerns regarding medicines are outlined above.  No orders of the defined types were placed in this encounter.   No orders of the defined types were placed in this encounter.   Signed, Armanda Magic, MD  12/08/2022 11:39 AM    [facility]

## 2022-12-12 DIAGNOSIS — N3000 Acute cystitis without hematuria: Secondary | ICD-10-CM | POA: Diagnosis not present

## 2022-12-15 ENCOUNTER — Encounter: Payer: Self-pay | Admitting: Cardiology

## 2022-12-16 DIAGNOSIS — N3000 Acute cystitis without hematuria: Secondary | ICD-10-CM | POA: Diagnosis not present

## 2022-12-16 DIAGNOSIS — R102 Pelvic and perineal pain: Secondary | ICD-10-CM | POA: Diagnosis not present

## 2022-12-16 DIAGNOSIS — N200 Calculus of kidney: Secondary | ICD-10-CM | POA: Diagnosis not present

## 2022-12-27 DIAGNOSIS — E119 Type 2 diabetes mellitus without complications: Secondary | ICD-10-CM | POA: Diagnosis not present

## 2022-12-27 DIAGNOSIS — H2513 Age-related nuclear cataract, bilateral: Secondary | ICD-10-CM | POA: Diagnosis not present

## 2022-12-27 DIAGNOSIS — H5213 Myopia, bilateral: Secondary | ICD-10-CM | POA: Diagnosis not present

## 2023-01-09 ENCOUNTER — Other Ambulatory Visit: Payer: Self-pay | Admitting: Family Medicine

## 2023-01-09 DIAGNOSIS — Z1231 Encounter for screening mammogram for malignant neoplasm of breast: Secondary | ICD-10-CM

## 2023-01-17 DIAGNOSIS — M545 Low back pain, unspecified: Secondary | ICD-10-CM | POA: Diagnosis not present

## 2023-01-17 DIAGNOSIS — M5416 Radiculopathy, lumbar region: Secondary | ICD-10-CM | POA: Diagnosis not present

## 2023-01-23 DIAGNOSIS — N390 Urinary tract infection, site not specified: Secondary | ICD-10-CM | POA: Diagnosis not present

## 2023-01-23 DIAGNOSIS — M545 Low back pain, unspecified: Secondary | ICD-10-CM | POA: Diagnosis not present

## 2023-01-31 DIAGNOSIS — M5416 Radiculopathy, lumbar region: Secondary | ICD-10-CM | POA: Diagnosis not present

## 2023-02-01 ENCOUNTER — Ambulatory Visit: Payer: BC Managed Care – PPO

## 2023-02-13 DIAGNOSIS — N3000 Acute cystitis without hematuria: Secondary | ICD-10-CM | POA: Diagnosis not present

## 2023-02-13 DIAGNOSIS — N200 Calculus of kidney: Secondary | ICD-10-CM | POA: Diagnosis not present

## 2023-02-14 DIAGNOSIS — E538 Deficiency of other specified B group vitamins: Secondary | ICD-10-CM | POA: Diagnosis not present

## 2023-02-14 DIAGNOSIS — K760 Fatty (change of) liver, not elsewhere classified: Secondary | ICD-10-CM | POA: Diagnosis not present

## 2023-02-14 DIAGNOSIS — I10 Essential (primary) hypertension: Secondary | ICD-10-CM | POA: Diagnosis not present

## 2023-02-14 DIAGNOSIS — N200 Calculus of kidney: Secondary | ICD-10-CM | POA: Diagnosis not present

## 2023-02-17 DIAGNOSIS — E538 Deficiency of other specified B group vitamins: Secondary | ICD-10-CM | POA: Diagnosis not present

## 2023-02-23 DIAGNOSIS — I509 Heart failure, unspecified: Secondary | ICD-10-CM | POA: Diagnosis not present

## 2023-02-23 DIAGNOSIS — E1165 Type 2 diabetes mellitus with hyperglycemia: Secondary | ICD-10-CM | POA: Diagnosis not present

## 2023-02-24 DIAGNOSIS — E538 Deficiency of other specified B group vitamins: Secondary | ICD-10-CM | POA: Diagnosis not present

## 2023-02-27 DIAGNOSIS — M5451 Vertebrogenic low back pain: Secondary | ICD-10-CM | POA: Diagnosis not present

## 2023-02-27 DIAGNOSIS — M7918 Myalgia, other site: Secondary | ICD-10-CM | POA: Diagnosis not present

## 2023-03-03 DIAGNOSIS — E538 Deficiency of other specified B group vitamins: Secondary | ICD-10-CM | POA: Diagnosis not present

## 2023-03-07 DIAGNOSIS — M5416 Radiculopathy, lumbar region: Secondary | ICD-10-CM | POA: Diagnosis not present

## 2023-03-08 DIAGNOSIS — M170 Bilateral primary osteoarthritis of knee: Secondary | ICD-10-CM | POA: Diagnosis not present

## 2023-03-10 DIAGNOSIS — E538 Deficiency of other specified B group vitamins: Secondary | ICD-10-CM | POA: Diagnosis not present

## 2023-03-14 DIAGNOSIS — M170 Bilateral primary osteoarthritis of knee: Secondary | ICD-10-CM | POA: Diagnosis not present

## 2023-03-16 ENCOUNTER — Ambulatory Visit: Payer: BC Managed Care – PPO

## 2023-03-20 DIAGNOSIS — M170 Bilateral primary osteoarthritis of knee: Secondary | ICD-10-CM | POA: Diagnosis not present

## 2023-03-29 ENCOUNTER — Ambulatory Visit
Admission: RE | Admit: 2023-03-29 | Discharge: 2023-03-29 | Disposition: A | Discharge status: Home / Self Care | Payer: BC Managed Care – PPO | Source: Ambulatory Visit | Attending: Family Medicine | Admitting: Family Medicine

## 2023-03-29 DIAGNOSIS — Z1231 Encounter for screening mammogram for malignant neoplasm of breast: Secondary | ICD-10-CM | POA: Diagnosis not present

## 2023-03-30 DIAGNOSIS — D225 Melanocytic nevi of trunk: Secondary | ICD-10-CM | POA: Diagnosis not present

## 2023-03-30 DIAGNOSIS — L821 Other seborrheic keratosis: Secondary | ICD-10-CM | POA: Diagnosis not present

## 2023-04-10 DIAGNOSIS — E538 Deficiency of other specified B group vitamins: Secondary | ICD-10-CM | POA: Diagnosis not present

## 2023-04-13 ENCOUNTER — Ambulatory Visit (INDEPENDENT_AMBULATORY_CARE_PROVIDER_SITE_OTHER): Payer: BC Managed Care – PPO | Admitting: Podiatry

## 2023-04-13 ENCOUNTER — Encounter: Payer: Self-pay | Admitting: Podiatry

## 2023-04-13 DIAGNOSIS — B351 Tinea unguium: Secondary | ICD-10-CM | POA: Diagnosis not present

## 2023-04-13 DIAGNOSIS — L03031 Cellulitis of right toe: Secondary | ICD-10-CM | POA: Diagnosis not present

## 2023-04-13 DIAGNOSIS — L02611 Cutaneous abscess of right foot: Secondary | ICD-10-CM | POA: Diagnosis not present

## 2023-04-13 DIAGNOSIS — M13841 Other specified arthritis, right hand: Secondary | ICD-10-CM | POA: Diagnosis not present

## 2023-04-13 DIAGNOSIS — M79645 Pain in left finger(s): Secondary | ICD-10-CM | POA: Diagnosis not present

## 2023-04-13 DIAGNOSIS — M13842 Other specified arthritis, left hand: Secondary | ICD-10-CM | POA: Diagnosis not present

## 2023-04-13 MED ORDER — FLUCONAZOLE 150 MG PO TABS
150.0000 mg | ORAL_TABLET | ORAL | 0 refills | Status: DC
Start: 2023-04-13 — End: 2023-11-30

## 2023-04-13 MED ORDER — TERBINAFINE HCL 250 MG PO TABS: 250.0000 mg | ORAL_TABLET | Freq: Every day | ORAL | 0 refills | Status: DC

## 2023-04-13 MED ORDER — DOXYCYCLINE HYCLATE 100 MG PO TABS
100.0000 mg | ORAL_TABLET | Freq: Two times a day (BID) | ORAL | 0 refills | Status: AC
Start: 2023-04-13 — End: 2023-04-23

## 2023-04-13 NOTE — Progress Notes (Signed)
Subjective:   Patient ID: Jaime Nichols, female   DOB: 62 y.o.   MRN: [id number]   HPI Patient presents with yellow discolored third nailbed right that also was causing some redness in the toe itself and discomfort with pathology and pressure   ROS      Objective:  Physical Exam  Neurovascular status intact with thickness of the right third nailbed with redness in the proximal fold no drainage noted or erythema edema     Assessment:  Probability for fungal infection of the right third nailbed and into the skin fold     Plan:  H&P and since there is infection into the nail fold I do think long-term that this will require oral antifungal therapy and I have recommended Lamisil.  She is getting blood work done she will send me the results by MyChart and when I see them good I will let her know and she will be able to start oral medicine and I did write for the 60 days but she will not start until I have seen the results of her liver function studies.  Also recommended laser and patient is scheduled for 3 laser treatment

## 2023-04-28 ENCOUNTER — Ambulatory Visit: Payer: BC Managed Care – PPO | Admitting: Podiatrist

## 2023-04-28 DIAGNOSIS — B351 Tinea unguium: Secondary | ICD-10-CM

## 2023-04-28 NOTE — Progress Notes (Signed)
Patient presents today for round 2 of lasering of toenails treatment # 2  (or #9 total treatments)  ( already completed one round of 7 treatments)    she is currently taking terbinafine once daily-  doing well with this medication after a couple weeks.  . Diagnosed with mycotic nail infection by Dr. Benancio Deeds most affected are right third toenail and fifth toenail.   All other systems are negative.  Patients nails were filed thin using a sterile burr.    Laser therapy via [facility] therapy system was admininstered to affected toenails right 3 and 5 .  The Patient tolerated the treatment well. All safety precautions were in place.   Single laser pass was done on non-affected nails right foot   Follow up in 4 weeks for laser # 3  (#10 total)

## 2023-05-01 DIAGNOSIS — Z79899 Other long term (current) drug therapy: Secondary | ICD-10-CM | POA: Diagnosis not present

## 2023-05-01 DIAGNOSIS — I10 Essential (primary) hypertension: Secondary | ICD-10-CM | POA: Diagnosis not present

## 2023-05-01 DIAGNOSIS — I5032 Chronic diastolic (congestive) heart failure: Secondary | ICD-10-CM | POA: Diagnosis not present

## 2023-05-01 DIAGNOSIS — E785 Hyperlipidemia, unspecified: Secondary | ICD-10-CM | POA: Diagnosis not present

## 2023-05-01 DIAGNOSIS — J984 Other disorders of lung: Secondary | ICD-10-CM | POA: Diagnosis not present

## 2023-05-01 DIAGNOSIS — E538 Deficiency of other specified B group vitamins: Secondary | ICD-10-CM | POA: Diagnosis not present

## 2023-05-09 DIAGNOSIS — R197 Diarrhea, unspecified: Secondary | ICD-10-CM | POA: Diagnosis not present

## 2023-05-10 ENCOUNTER — Emergency Department (HOSPITAL_BASED_OUTPATIENT_CLINIC_OR_DEPARTMENT_OTHER)
Admission: EM | Admit: 2023-05-10 | Discharge: 2023-05-10 | Disposition: A | Discharge status: Home / Self Care | Payer: BC Managed Care – PPO | Attending: Emergency Medicine | Admitting: Emergency Medicine

## 2023-05-10 ENCOUNTER — Other Ambulatory Visit: Payer: Self-pay

## 2023-05-10 ENCOUNTER — Emergency Department (HOSPITAL_BASED_OUTPATIENT_CLINIC_OR_DEPARTMENT_OTHER): Payer: BC Managed Care – PPO

## 2023-05-10 ENCOUNTER — Encounter (HOSPITAL_BASED_OUTPATIENT_CLINIC_OR_DEPARTMENT_OTHER): Payer: Self-pay | Admitting: Emergency Medicine

## 2023-05-10 DIAGNOSIS — Z794 Long term (current) use of insulin: Secondary | ICD-10-CM | POA: Insufficient documentation

## 2023-05-10 DIAGNOSIS — N200 Calculus of kidney: Secondary | ICD-10-CM | POA: Diagnosis not present

## 2023-05-10 DIAGNOSIS — K529 Noninfective gastroenteritis and colitis, unspecified: Secondary | ICD-10-CM | POA: Insufficient documentation

## 2023-05-10 DIAGNOSIS — I5032 Chronic diastolic (congestive) heart failure: Secondary | ICD-10-CM | POA: Diagnosis not present

## 2023-05-10 DIAGNOSIS — I110 Hypertensive heart disease with heart failure: Secondary | ICD-10-CM | POA: Insufficient documentation

## 2023-05-10 DIAGNOSIS — R109 Unspecified abdominal pain: Secondary | ICD-10-CM | POA: Diagnosis not present

## 2023-05-10 DIAGNOSIS — Z7984 Long term (current) use of oral hypoglycemic drugs: Secondary | ICD-10-CM | POA: Diagnosis not present

## 2023-05-10 DIAGNOSIS — Z91040 Latex allergy status: Secondary | ICD-10-CM | POA: Insufficient documentation

## 2023-05-10 DIAGNOSIS — Z79899 Other long term (current) drug therapy: Secondary | ICD-10-CM | POA: Diagnosis not present

## 2023-05-10 DIAGNOSIS — R1084 Generalized abdominal pain: Secondary | ICD-10-CM | POA: Diagnosis not present

## 2023-05-10 DIAGNOSIS — E119 Type 2 diabetes mellitus without complications: Secondary | ICD-10-CM | POA: Insufficient documentation

## 2023-05-10 DIAGNOSIS — Z90710 Acquired absence of both cervix and uterus: Secondary | ICD-10-CM | POA: Diagnosis not present

## 2023-05-10 DIAGNOSIS — R197 Diarrhea, unspecified: Secondary | ICD-10-CM

## 2023-05-10 LAB — COMPREHENSIVE METABOLIC PANEL
ALT: 26 U/L (ref 0–44)
AST: 28 U/L (ref 15–41)
Albumin: 4 g/dL (ref 3.5–5.0)
Alkaline Phosphatase: 83 U/L (ref 38–126)
Anion gap: 10 (ref 5–15)
BUN: 15 mg/dL (ref 8–23)
CO2: 23 mmol/L (ref 22–32)
Calcium: 9.6 mg/dL (ref 8.9–10.3)
Chloride: 101 mmol/L (ref 98–111)
Creatinine, Ser: 0.95 mg/dL (ref 0.44–1.00)
GFR, Estimated: >60 mL/min (ref >60)
Glucose, Bld: 165 mg/dL — ABNORMAL HIGH (ref 70–99)
Potassium: 3.8 mmol/L (ref 3.5–5.1)
Sodium: 134 mmol/L — ABNORMAL LOW (ref 135–145)
Total Bilirubin: 0.7 mg/dL (ref <1.2)
Total Protein: 7 g/dL (ref 6.5–8.1)

## 2023-05-10 LAB — URINALYSIS, ROUTINE W REFLEX MICROSCOPIC
Bacteria, UA: NONE SEEN
Bilirubin Urine: NEGATIVE
Glucose, UA: NEGATIVE mg/dL
Hgb urine dipstick: NEGATIVE
Ketones, ur: NEGATIVE mg/dL
Leukocytes,Ua: NEGATIVE
Nitrite: NEGATIVE
Protein, ur: 30 mg/dL — AB
Specific Gravity, Urine: >1.046 — ABNORMAL HIGH (ref 1.005–1.030)
pH: 5.5 (ref 5.0–8.0)

## 2023-05-10 LAB — CBC WITH DIFFERENTIAL/PLATELET
Abs Immature Granulocytes: 0.05 10*3/uL (ref 0.00–0.07)
Basophils Absolute: 0 10*3/uL (ref 0.0–0.1)
Basophils Relative: 0 %
Eosinophils Absolute: 0 10*3/uL (ref 0.0–0.5)
Eosinophils Relative: 0 %
HCT: 41.2 % (ref 36.0–46.0)
Hemoglobin: 14 g/dL (ref 12.0–15.0)
Immature Granulocytes: 1 %
Lymphocytes Relative: 14 %
Lymphs Abs: 1.2 10*3/uL (ref 0.7–4.0)
MCH: 29 pg (ref 26.0–34.0)
MCHC: 34 g/dL (ref 30.0–36.0)
MCV: 85.3 fL (ref 80.0–100.0)
Monocytes Absolute: 1 10*3/uL (ref 0.1–1.0)
Monocytes Relative: 11 %
Neutro Abs: 6.4 10*3/uL (ref 1.7–7.7)
Neutrophils Relative %: 74 %
Platelets: 181 10*3/uL (ref 150–400)
RBC: 4.83 MIL/uL (ref 3.87–5.11)
RDW: 13.8 % (ref 11.5–15.5)
WBC: 8.8 10*3/uL (ref 4.0–10.5)
nRBC: 0 % (ref 0.0–0.2)

## 2023-05-10 LAB — C DIFFICILE QUICK SCREEN W PCR REFLEX
C Diff antigen: NEGATIVE
C Diff interpretation: NOT DETECTED
C Diff toxin: NEGATIVE

## 2023-05-10 LAB — LIPASE, BLOOD: Lipase: 107 U/L — ABNORMAL HIGH (ref 11–51)

## 2023-05-10 MED ORDER — LACTATED RINGERS IV BOLUS
1000.0000 mL | Freq: Once | INTRAVENOUS | Status: AC
  Administered 2023-05-10: 1000 mL via INTRAVENOUS

## 2023-05-10 MED ORDER — DICYCLOMINE HCL 10 MG PO CAPS
10.0000 mg | ORAL_CAPSULE | Freq: Once | ORAL | Status: AC
  Administered 2023-05-10: 10 mg via ORAL
  Filled 2023-05-10: qty 1

## 2023-05-10 MED ORDER — IOHEXOL 300 MG/ML  SOLN
100.0000 mL | Freq: Once | INTRAMUSCULAR | Status: AC | PRN
  Administered 2023-05-10: 100 mL via INTRAVENOUS

## 2023-05-10 MED ORDER — DICYCLOMINE HCL 20 MG PO TABS: 20.0000 mg | ORAL_TABLET | Freq: Two times a day (BID) | ORAL | 0 refills | Status: DC

## 2023-05-10 NOTE — Discharge Instructions (Addendum)
Thank you for allowing [place] to be a part of your care today.  Your workup is concerning for infectious diarrhea and colitis.  You will be notified of your pending test results and the appropriate treatment will be sent in if your stool panel comes back abnormal.  Your lipase can be elevated in infectious colitis as there is inflammation to the gastrointestinal tract.   For your Imodium (loperamide): Take 4 mg one time, then 2 mg after each loose stool with a maximum of 16 mg/day.   I have also prescribed Bentyl (dicyclomine) to take twice daily to help with abdominal cramping and spasms.   Increase your oral hydration as much as possible to avoid dehydration.  You may incorporate broths, sugar free electrolyte drinks, or ginger ale in addition to water.   Schedule a follow up appointment with your primary care doctor in 1 week to recheck your labs.   If your stool panel comes back completely negative, schedule a follow-up appointment with [facility].   Return to the ED if you develop sudden worsening of your symptoms, have bloody diarrhea, decreased urine, fainting or near fainting, or if you have new concerns.

## 2023-05-10 NOTE — ED Notes (Signed)
Patient transported to CT 

## 2023-05-10 NOTE — ED Provider Notes (Signed)
EMERGENCY DEPARTMENT AT [facility] Provider Note   CSN: [id number] Arrival date & time: 05/10/23  1232     History  Chief Complaint  Patient presents with   Abdominal Pain    Jaime Nichols is a 62 y.o. female with past medical history significant for hypertension, hyperlipidemia, fibroids, chronic diastolic CHF, type 2 diabetes, kidney stones presents to the ED complaining of generalized, cramping abdominal pain for the past 5 days.  She reports she did have fever for a couple of days, highest temp of 101F.  Patient has had persistent diarrhea, multiple episodes.  States her stool is foul smelling, yellow-orange in color, and contains mucus.  Only new food she tried this past weekend was frozen dumplings.  Denies recent travel.  Patient saw her primary care provider yesterday for evaluation and labs.  She states she continues to have chills, but no fever.  She also had abdominal cramping so severe she was nauseated.  Patient was able to eat some broth yesterday.  Denies light-headedness, syncope, weakness, vomiting, blood in stool.         Home Medications Prior to Admission medications   Medication Sig Start Date End Date Taking? Authorizing Provider  dicyclomine (BENTYL) 20 MG tablet Take 1 tablet (20 mg total) by mouth 2 (two) times daily. 05/10/23  Yes Mertis Mosher R, PA-C  acetaminophen (TYLENOL) 500 MG tablet Take 500-1,000 mg by mouth every 6 (six) hours as needed for headache (or pain).    [provider]  allopurinol (ZYLOPRIM) 300 MG tablet Take 300 mg by mouth daily. 09/20/21   [provider]  celecoxib (CELEBREX) 200 MG capsule Take 200 mg by mouth as needed.    [provider]  colchicine 0.6 MG tablet as needed. 09/07/21   [provider]  D 1000 25 MCG (1000 UT) capsule Take 1,000 Units by mouth 3 (three) times a week. 09/07/21   [provider]  diltiazem (CARDIZEM CD) 120 MG 24 hr capsule TAKE 1  CAPSULE BY MOUTH EVERY DAY 07/08/22   Quintella Reichert, MD  erythromycin ophthalmic ointment Place 1 Application into both eyes as needed.    [provider]  estradiol (ESTRACE) 0.1 MG/GM vaginal cream Place 1 Applicatorful vaginally every 14 (fourteen) days. 11/24/20   [provider]  Evolocumab (REPATHA SURECLICK) 140 MG/ML SOAJ INJECT 1 PEN INTO THE SKIN EVERY 14 (FOURTEEN) DAYS. 06/06/22   Quintella Reichert, MD  fluconazole (DIFLUCAN) 150 MG tablet Take 150 mg by mouth as needed.    [provider]  fluconazole (DIFLUCAN) 150 MG tablet Take 1 tablet (150 mg total) by mouth once a week. 04/13/23   Lenn Sink, DPM  glimepiride (AMARYL) 4 MG tablet Take 2 mg by mouth every morning. 04/21/22   [provider]  levalbuterol Pauline Aus HFA) 45 MCG/ACT inhaler Inhale 1-2 puffs into the lungs every 4 (four) hours as needed for shortness of breath. 09/27/18   [provider]  lisinopril (ZESTRIL) 10 MG tablet Take 1 tablet (10 mg total) by mouth daily. 05/04/21   Quintella Reichert, MD  magnesium gluconate (MAGONATE) 500 MG tablet Take 500 mg by mouth 2 (two) times daily.    [provider]  metFORMIN (GLUCOPHAGE-XR) 500 MG 24 hr tablet Take 500 mg by mouth daily with breakfast.    [provider]  metoprolol succinate (TOPROL-XL) 25 MG 24 hr tablet TAKE 1 TABLET (25 MG TOTAL) BY MOUTH DAILY. 05/16/22  Quintella Reichert, MD  nitrofurantoin, macrocrystal-monohydrate, (MACROBID) 100 MG capsule Take 100 mg by mouth 2 (two) times daily. 4 to 5 days    [provider]  Potassium Citrate 15 MEQ (1620 MG) TBCR Take 15 mEq by mouth 3 (three) times a week. 06/04/21   [provider]  PRESCRIPTION MEDICATION CPAP- At bedtime    [provider]  sulfamethoxazole-trimethoprim (BACTRIM DS) 800-160 MG tablet Take 1 tablet by mouth 2 (two) times daily. 12/06/22   [provider]  Taurine 1000 MG CAPS Take 100 mg by mouth in the  morning, at noon, and at bedtime.    [provider]  terbinafine (LAMISIL) 250 MG tablet Take 1 tablet (250 mg total) by mouth daily. 04/13/23   Lenn Sink, DPM  terconazole (TERAZOL 7) 0.4 % vaginal cream Place 1 applicator vaginally as needed. 08/06/21   [provider]  tirzepatide Greggory Keen) 12.5 MG/0.5ML Pen Inject 2.5 mg into the skin once a week. 04/12/22   [provider]  tiZANidine (ZANAFLEX) 4 MG tablet Take 0.5 tablets (2 mg total) by mouth every 6 (six) hours as needed for muscle spasms. 07/28/21   Crist Fat, MD      Allergies    Erythromycin, Latex, Nickel, Statins, Crestor [rosuvastatin], Dapagliflozin, Metformin hcl, Potassium citrate, and Zocor [simvastatin]    Review of Systems   Review of Systems  Constitutional:  Positive for chills and fever.  Gastrointestinal:  Positive for abdominal distention ("bloated"), abdominal pain, diarrhea and nausea. Negative for blood in stool and vomiting.  Neurological:  Negative for syncope, weakness and light-headedness.    Physical Exam Updated Vital Signs BP 123/75   Pulse (!) 103   Temp 98.3 F (36.8 C) (Oral)   Resp 15   Ht 5' 2.75" (1.594 m)   Wt 115.2 kg   SpO2 98%   BMI 45.35 kg/m  Physical Exam Vitals and nursing note reviewed.  Constitutional:      General: She is not in acute distress.    Appearance: Normal appearance. She is not ill-appearing or diaphoretic.  HENT:     Mouth/Throat:     Lips: Pink.     Mouth: Mucous membranes are dry.  Cardiovascular:     Rate and Rhythm: Regular rhythm. Tachycardia present.  Pulmonary:     Effort: Pulmonary effort is normal.  Abdominal:     General: Abdomen is protuberant. Bowel sounds are normal.     Palpations: Abdomen is soft.     Tenderness: There is abdominal tenderness (mild) in the periumbilical area.  Skin:    General: Skin is warm and dry.     Capillary Refill: Capillary refill takes less than 2 seconds.     Coloration:  Skin is pale.  Neurological:     Mental Status: She is alert. Mental status is at baseline.  Psychiatric:        Mood and Affect: Mood normal.        Behavior: Behavior normal.     ED Results / Procedures / Treatments   Labs (all labs ordered are listed, but only abnormal results are displayed) Labs Reviewed  LIPASE, BLOOD - Abnormal; Notable for the following components:      Result Value   Lipase 107 (*)    All other components within normal limits  COMPREHENSIVE METABOLIC PANEL - Abnormal; Notable for the following components:   Sodium 134 (*)    Glucose, Bld 165 (*)    All other components  within normal limits  URINALYSIS, ROUTINE W REFLEX MICROSCOPIC - Abnormal; Notable for the following components:   Specific Gravity, Urine >1.046 (*)    Protein, ur 30 (*)    All other components within normal limits  GASTROINTESTINAL PANEL BY PCR, STOOL (REPLACES STOOL CULTURE)  C DIFFICILE QUICK SCREEN W PCR REFLEX    CBC WITH DIFFERENTIAL/PLATELET    EKG None  Radiology CT ABDOMEN PELVIS W CONTRAST  Result Date: 05/10/2023 CLINICAL DATA:  Acute abdominal pain. Generalized diarrhea. Elevated lipase. EXAM: CT ABDOMEN AND PELVIS WITH CONTRAST TECHNIQUE: Multidetector CT imaging of the abdomen and pelvis was performed using the standard protocol following bolus administration of intravenous contrast. RADIATION DOSE REDUCTION: This exam was performed according to the departmental dose-optimization program which includes automated exposure control, adjustment of the mA and/or kV according to patient size and/or use of iterative reconstruction technique. CONTRAST:  OMNIPAQUE IOHEXOL 300 MG/ML  SOLN COMPARISON:  CT 07/01/2021 FINDINGS: Lower chest: Mild basilar atelectasis. Hepatobiliary: No focal hepatic lesion. Postcholecystectomy. No biliary dilatation. Pancreas: Pancreas is normal. No ductal dilatation. No pancreatic inflammation. Spleen: Normal spleen Adrenals/urinary tract: Adrenal  glands normal. Bilateral 6 mm nonobstructing renal calculi. Ureters and bladder normal. Simple fluid attenuation cyst in the mid RIGHT kidney measures 17 mm. No follow-up recommended for benign renal lesion. Stomach/Bowel: Stomach,, duodenum and proximal small bowel normal. There is a long segment of submucosal edema involving the distal ileum leading up the terminal ileum. Obstruction. Small bowel dilatation or stricturing. Terminal ileum is normal. The ascending colon cecum also demonstrate similar submucosal edema (example small-bowel mild submucosal edema noted on image 67/2. Submucosal edema in the colon noted on image 51/2. These transverse descending colon normal. Rectosigmoid colon normal. Vascular/Lymphatic: Abdominal aorta is normal caliber. No periportal or retroperitoneal adenopathy. No pelvic adenopathy. Reproductive: Post hysterectomy.  Adnexa unremarkable Other: No free fluid. Musculoskeletal: No aggressive osseous lesion. IMPRESSION: 1. Long segment of submucosal edema involving the distal ileum and ascending colon. Findings suggestive of inflammatory bowel disease. Long segment enteritis colitis related to infection is also consideration. 2. No evidence of bowel obstruction or stricturing. 3. Normal pancreas. 4. Bilateral nonobstructing renal calculi. Electronically Signed   By: Genevive Bi M.D.   On: 05/10/2023 17:57    Procedures Procedures    Medications Ordered in ED Medications  lactated ringers bolus 1,000 mL (0 mLs Intravenous Stopped 05/10/23 1610)  iohexol (OMNIPAQUE) 300 MG/ML solution 100 mL (100 mLs Intravenous Contrast Given 05/10/23 1447)  dicyclomine (BENTYL) capsule 10 mg (10 mg Oral Given 05/10/23 1841)    ED Course/ Medical Decision Making/ A&P                                 Medical Decision Making Amount and/or Complexity of Data Reviewed Labs: ordered. Radiology: ordered.  Risk Prescription drug management.   This patient presents to the ED with  chief complaint(s) of abdominal pain, diarrhea, fever with pertinent past medical history of .  The complaint involves an extensive differential diagnosis and also carries with it a high risk of complications and morbidity.    The differential diagnosis includes infectious diarrhea, pancreatitis, colitis, diverticulitis   The initial plan is to obtain labs, stool panel, UA  Initial Assessment:   On exam, patient is resting comfortably in bed and is not in acute distress.  Abdomen appears protuberant with periumbilical tenderness.  Bowel sounds are normal.  No overlying skin changes.  Skin is dry, mucus membranes are dry.  Patient is mildly tachycardic with regular rhythm.  Patient is afebrile.    Independent ECG/labs interpretation:  The following labs were independently interpreted:  GI Panel and C. Diff stool tests were ordered and are pending.  CBC without leukocytosis.  Metabolic panel with mild hyponatremia and hyperglycemia.  No major electrolyte disturbance.  Renal and hepatic function are both normal.   Lipase elevated, somewhat more than yesterday, but not at a level of pancreatitis.   UA without infection, appears concentrated with high specific gravity, likely due to mild dehydration.    Independent visualization and interpretation of imaging: I independently visualized the following imaging with scope of interpretation limited to determining acute life threatening conditions related to emergency care: CT abdomen/pelvis, which revealed long segment of submucosal edema involving the distal ileum and ascending colon, suggestive of long segment enteritis colitis or IBD.  Normal pancreas.  No bowel obstruction.    Treatment and Reassessment: Patient given 1L IV fluid bolus.  She reports feeling much improved after this.  She is tolerating ginger ale without issue.    Disposition:   Patient will be notified of pending test results and appropriate treatment will be prescribed if stool  panel returns abnormal.  Suspect patient's symptoms are related to an infectious diarrhea, however, IBD cannot be completely ruled out.  Discussed with patient continued use of loperamide.  Will also send patient home on Bentyl to help with cramping abdominal pain.  Advised patient to increase her fluid intake to avoid dehydration.  Recommended patient follow up with PCP in 1 week to repeat labs.  Advised patient to follow up with [facility] (whom she has seen in past) if stool panel is negative and diarrhea persists.   The patient has been appropriately medically screened and/or stabilized in the ED. I have low suspicion for any other emergent medical condition which would require further screening, evaluation or treatment in the ED or require inpatient management. At time of discharge the patient is hemodynamically stable and in no acute distress. I have discussed work-up results and diagnosis with patient and answered all questions. Patient is agreeable with discharge plan. We discussed strict return precautions for returning to the emergency department and they verbalized understanding.             Final Clinical Impression(s) / ED Diagnoses Final diagnoses:  Diarrhea of presumed infectious origin  Colitis    Rx / DC Orders ED Discharge Orders          Ordered    dicyclomine (BENTYL) 20 MG tablet  2 times daily        05/10/23 1831              Lenard Simmer, PA-C 05/10/23 1911    Gwyneth Sprout, MD 05/15/23 1154

## 2023-05-10 NOTE — ED Notes (Signed)
Patient attempt to give stool and urine sample unsuccessful

## 2023-05-10 NOTE — ED Triage Notes (Signed)
Abdo pain x 5 days - generalized Diarrhea (resolved), fever (resolved)  Seen at [facility] elevated lipase;   Chills x 2 days, cramping

## 2023-05-11 ENCOUNTER — Telehealth (HOSPITAL_COMMUNITY): Payer: Self-pay | Admitting: Emergency Medicine

## 2023-05-11 LAB — GASTROINTESTINAL PANEL BY PCR, STOOL (REPLACES STOOL CULTURE)

## 2023-05-11 NOTE — Telephone Encounter (Cosign Needed)
Called patient to discuss stool panel results and to see how she was doing.  Patient positive for Salmonella.  Patient continues to have diarrhea with mild bowel incontinence, but she is staying better hydrated today.  She reports some mild fatigue.  Recommended patient incorporate electrolyte beverages throughout the day.  Discussed with patient that management is typically supportive care of symptoms and staying well hydrated.  Advised patient to continue using loperamide, Bentyl, and ibuprofen as needed for symptom management.   Recommended to patient to have her own dedicated bathroom within the house as Salmonella is contagious.   Advised patient to reach out to her primary care doctor on Monday if her symptoms are still persisting.  Advised patient to return to the ED if she develops fever, worsening symptoms, or develops signs/symptoms of dehydration.  Patient verbalized her understanding.

## 2023-05-11 NOTE — ED Notes (Signed)
GI Panel result-positive for Salmonella called at [phone] 05/11/23

## 2023-05-25 DIAGNOSIS — A020 Salmonella enteritis: Secondary | ICD-10-CM | POA: Diagnosis not present

## 2023-05-25 DIAGNOSIS — R197 Diarrhea, unspecified: Secondary | ICD-10-CM | POA: Diagnosis not present

## 2023-05-25 DIAGNOSIS — R748 Abnormal levels of other serum enzymes: Secondary | ICD-10-CM | POA: Diagnosis not present

## 2023-05-25 DIAGNOSIS — E538 Deficiency of other specified B group vitamins: Secondary | ICD-10-CM | POA: Diagnosis not present

## 2023-06-01 DIAGNOSIS — R197 Diarrhea, unspecified: Secondary | ICD-10-CM | POA: Diagnosis not present

## 2023-06-01 DIAGNOSIS — A020 Salmonella enteritis: Secondary | ICD-10-CM | POA: Diagnosis not present

## 2023-06-02 ENCOUNTER — Other Ambulatory Visit: Payer: Self-pay

## 2023-06-02 ENCOUNTER — Emergency Department (HOSPITAL_BASED_OUTPATIENT_CLINIC_OR_DEPARTMENT_OTHER): Payer: BC Managed Care – PPO

## 2023-06-02 ENCOUNTER — Encounter (HOSPITAL_BASED_OUTPATIENT_CLINIC_OR_DEPARTMENT_OTHER): Payer: Self-pay | Admitting: Urology

## 2023-06-02 ENCOUNTER — Emergency Department (HOSPITAL_BASED_OUTPATIENT_CLINIC_OR_DEPARTMENT_OTHER)
Admission: EM | Admit: 2023-06-02 | Discharge: 2023-06-02 | Disposition: A | Discharge status: Home / Self Care | Payer: BC Managed Care – PPO | Attending: Emergency Medicine | Admitting: Emergency Medicine

## 2023-06-02 ENCOUNTER — Encounter (HOSPITAL_BASED_OUTPATIENT_CLINIC_OR_DEPARTMENT_OTHER): Payer: Self-pay

## 2023-06-02 ENCOUNTER — Other Ambulatory Visit: Payer: Self-pay | Admitting: Urology

## 2023-06-02 DIAGNOSIS — N200 Calculus of kidney: Secondary | ICD-10-CM | POA: Insufficient documentation

## 2023-06-02 DIAGNOSIS — E119 Type 2 diabetes mellitus without complications: Secondary | ICD-10-CM | POA: Insufficient documentation

## 2023-06-02 DIAGNOSIS — Z7984 Long term (current) use of oral hypoglycemic drugs: Secondary | ICD-10-CM | POA: Diagnosis not present

## 2023-06-02 DIAGNOSIS — I10 Essential (primary) hypertension: Secondary | ICD-10-CM | POA: Insufficient documentation

## 2023-06-02 DIAGNOSIS — Z79899 Other long term (current) drug therapy: Secondary | ICD-10-CM | POA: Insufficient documentation

## 2023-06-02 DIAGNOSIS — N132 Hydronephrosis with renal and ureteral calculous obstruction: Secondary | ICD-10-CM | POA: Diagnosis not present

## 2023-06-02 DIAGNOSIS — K5730 Diverticulosis of large intestine without perforation or abscess without bleeding: Secondary | ICD-10-CM | POA: Diagnosis not present

## 2023-06-02 DIAGNOSIS — Z91040 Latex allergy status: Secondary | ICD-10-CM | POA: Diagnosis not present

## 2023-06-02 DIAGNOSIS — R109 Unspecified abdominal pain: Secondary | ICD-10-CM | POA: Diagnosis not present

## 2023-06-02 LAB — URINALYSIS, ROUTINE W REFLEX MICROSCOPIC
Bacteria, UA: NONE SEEN
Bilirubin Urine: NEGATIVE
Glucose, UA: 250 mg/dL — AB
Ketones, ur: 15 mg/dL — AB
Leukocytes,Ua: NEGATIVE
Nitrite: NEGATIVE
Protein, ur: NEGATIVE mg/dL
Specific Gravity, Urine: 1.022 (ref 1.005–1.030)
pH: 5 (ref 5.0–8.0)

## 2023-06-02 LAB — COMPREHENSIVE METABOLIC PANEL
ALT: 16 U/L (ref 0–44)
AST: 16 U/L (ref 15–41)
Albumin: 4.1 g/dL (ref 3.5–5.0)
Alkaline Phosphatase: 84 U/L (ref 38–126)
Anion gap: 10 (ref 5–15)
BUN: 15 mg/dL (ref 8–23)
CO2: 25 mmol/L (ref 22–32)
Calcium: 9.8 mg/dL (ref 8.9–10.3)
Chloride: 103 mmol/L (ref 98–111)
Creatinine, Ser: 0.72 mg/dL (ref 0.44–1.00)
GFR, Estimated: >60 mL/min (ref >60)
Glucose, Bld: 221 mg/dL — ABNORMAL HIGH (ref 70–99)
Potassium: 4.5 mmol/L (ref 3.5–5.1)
Sodium: 138 mmol/L (ref 135–145)
Total Bilirubin: 0.5 mg/dL (ref <1.2)
Total Protein: 6.9 g/dL (ref 6.5–8.1)

## 2023-06-02 LAB — CBC
HCT: 39.8 % (ref 36.0–46.0)
Hemoglobin: 13.1 g/dL (ref 12.0–15.0)
MCH: 28.9 pg (ref 26.0–34.0)
MCHC: 32.9 g/dL (ref 30.0–36.0)
MCV: 87.7 fL (ref 80.0–100.0)
Platelets: 267 10*3/uL (ref 150–400)
RBC: 4.54 MIL/uL (ref 3.87–5.11)
RDW: 14 % (ref 11.5–15.5)
WBC: 10.8 10*3/uL — ABNORMAL HIGH (ref 4.0–10.5)
nRBC: 0 % (ref 0.0–0.2)

## 2023-06-02 LAB — LIPASE, BLOOD: Lipase: 75 U/L — ABNORMAL HIGH (ref 11–51)

## 2023-06-02 MED ORDER — ONDANSETRON 8 MG PO TBDP: ORAL_TABLET | ORAL | 0 refills | Status: DC

## 2023-06-02 MED ORDER — TAMSULOSIN HCL 0.4 MG PO CAPS: 0.4000 mg | ORAL_CAPSULE | Freq: Every day | ORAL | 0 refills | Status: DC

## 2023-06-02 MED ORDER — OXYCODONE-ACETAMINOPHEN 5-325 MG PO TABS: 1.0000 | ORAL_TABLET | Freq: Four times a day (QID) | ORAL | 0 refills | Status: DC | PRN

## 2023-06-02 MED ORDER — ONDANSETRON HCL 4 MG/2ML IJ SOLN
4.0000 mg | Freq: Once | INTRAMUSCULAR | Status: AC
  Administered 2023-06-02: 4 mg via INTRAVENOUS
  Filled 2023-06-02: qty 2

## 2023-06-02 MED ORDER — SODIUM CHLORIDE 0.9 % IV BOLUS
500.0000 mL | Freq: Once | INTRAVENOUS | Status: AC
  Administered 2023-06-02: 500 mL via INTRAVENOUS

## 2023-06-02 MED ORDER — KETOROLAC TROMETHAMINE 30 MG/ML IJ SOLN
30.0000 mg | Freq: Once | INTRAMUSCULAR | Status: AC
  Administered 2023-06-02: 30 mg via INTRAVENOUS
  Filled 2023-06-02: qty 1

## 2023-06-02 MED ORDER — MORPHINE SULFATE (PF) 4 MG/ML IV SOLN
4.0000 mg | Freq: Once | INTRAVENOUS | Status: AC
  Administered 2023-06-02: 4 mg via INTRAVENOUS
  Filled 2023-06-02: qty 1

## 2023-06-02 NOTE — ED Provider Notes (Signed)
[place] EMERGENCY DEPARTMENT AT [facility] Provider Note   CSN: [id number] Arrival date & time: 06/02/23  4098     History  Chief Complaint  Patient presents with   Flank Pain    Jaime Nichols is a 62 y.o. female.  Patient is a 62 year old female with past medical history of renal calculi, type 2 diabetes, hyperlipidemia, hypertension, obesity, obstructive sleep apnea, and recent illness of Salmonella causing prolonged diarrhea.  Patient presenting today with complaints of left flank pain.  This started suddenly tonight in the absence of any injury or trauma.  She describes pain to the left flank radiating into her left abdomen.  She denies fevers or chills.  No urinary complaints.  This feels different from what she is experienced with this recent Salmonella illness and feels more like a kidney stone.  The history is provided by the patient.       Home Medications Prior to Admission medications   Medication Sig Start Date End Date Taking? Authorizing Provider  acetaminophen (TYLENOL) 500 MG tablet Take 500-1,000 mg by mouth every 6 (six) hours as needed for headache (or pain).    [provider]  allopurinol (ZYLOPRIM) 300 MG tablet Take 300 mg by mouth daily. 09/20/21   [provider]  celecoxib (CELEBREX) 200 MG capsule Take 200 mg by mouth as needed.    [provider]  colchicine 0.6 MG tablet as needed. 09/07/21   [provider]  D 1000 25 MCG (1000 UT) capsule Take 1,000 Units by mouth 3 (three) times a week. 09/07/21   [provider]  dicyclomine (BENTYL) 20 MG tablet Take 1 tablet (20 mg total) by mouth 2 (two) times daily. 05/10/23   Clark, Meghan R, PA-C  diltiazem (CARDIZEM CD) 120 MG 24 hr capsule TAKE 1 CAPSULE BY MOUTH EVERY DAY 07/08/22   Quintella Reichert, MD  erythromycin ophthalmic ointment Place 1 Application into both eyes as needed.    [provider]  estradiol (ESTRACE) 0.1 MG/GM vaginal  cream Place 1 Applicatorful vaginally every 14 (fourteen) days. 11/24/20   [provider]  Evolocumab (REPATHA SURECLICK) 140 MG/ML SOAJ INJECT 1 PEN INTO THE SKIN EVERY 14 (FOURTEEN) DAYS. 06/06/22   Quintella Reichert, MD  fluconazole (DIFLUCAN) 150 MG tablet Take 150 mg by mouth as needed.    [provider]  fluconazole (DIFLUCAN) 150 MG tablet Take 1 tablet (150 mg total) by mouth once a week. 04/13/23   Lenn Sink, DPM  glimepiride (AMARYL) 4 MG tablet Take 2 mg by mouth every morning. 04/21/22   [provider]  levalbuterol Pauline Aus HFA) 45 MCG/ACT inhaler Inhale 1-2 puffs into the lungs every 4 (four) hours as needed for shortness of breath. 09/27/18   [provider]  lisinopril (ZESTRIL) 10 MG tablet Take 1 tablet (10 mg total) by mouth daily. 05/04/21   Quintella Reichert, MD  magnesium gluconate (MAGONATE) 500 MG tablet Take 500 mg by mouth 2 (two) times daily.    [provider]  metFORMIN (GLUCOPHAGE-XR) 500 MG 24 hr tablet Take 500 mg by mouth daily with breakfast.    [provider]  metoprolol succinate (TOPROL-XL) 25 MG 24 hr tablet TAKE 1 TABLET (25 MG TOTAL) BY MOUTH DAILY. 05/16/22   Quintella Reichert, MD  nitrofurantoin, macrocrystal-monohydrate, (MACROBID) 100 MG capsule Take 100 mg by mouth 2 (two) times daily. 4 to 5 days    [provider]  Potassium Citrate 15  MEQ (1620 MG) TBCR Take 15 mEq by mouth 3 (three) times a week. 06/04/21   [provider]  PRESCRIPTION MEDICATION CPAP- At bedtime    [provider]  sulfamethoxazole-trimethoprim (BACTRIM DS) 800-160 MG tablet Take 1 tablet by mouth 2 (two) times daily. 12/06/22   [provider]  Taurine 1000 MG CAPS Take 100 mg by mouth in the morning, at noon, and at bedtime.    [provider]  terbinafine (LAMISIL) 250 MG tablet Take 1 tablet (250 mg total) by mouth daily. 04/13/23   Lenn Sink, DPM  terconazole (TERAZOL 7) 0.4 %  vaginal cream Place 1 applicator vaginally as needed. 08/06/21   [provider]  tirzepatide Greggory Keen) 12.5 MG/0.5ML Pen Inject 2.5 mg into the skin once a week. 04/12/22   [provider]  tiZANidine (ZANAFLEX) 4 MG tablet Take 0.5 tablets (2 mg total) by mouth every 6 (six) hours as needed for muscle spasms. 07/28/21   Crist Fat, MD      Allergies    Erythromycin, Latex, Nickel, Statins, Crestor [rosuvastatin], Dapagliflozin, Metformin hcl, Potassium citrate, and Zocor [simvastatin]    Review of Systems   Review of Systems  All other systems reviewed and are negative.   Physical Exam Updated Vital Signs BP (!) 171/82   Pulse 88   Temp 98.2 F (36.8 C) (Oral)   Resp 18   Ht 5' 2.75" (1.594 m)   Wt 112.9 kg   SpO2 98%   BMI 44.46 kg/m  Physical Exam Vitals and nursing note reviewed.  Constitutional:      General: She is not in acute distress.    Appearance: She is well-developed. She is not diaphoretic.  HENT:     Head: Normocephalic and atraumatic.  Cardiovascular:     Rate and Rhythm: Normal rate and regular rhythm.     Heart sounds: No murmur heard.    No friction rub. No gallop.  Pulmonary:     Effort: Pulmonary effort is normal. No respiratory distress.     Breath sounds: Normal breath sounds. No wheezing.  Abdominal:     General: Bowel sounds are normal. There is no distension.     Palpations: Abdomen is soft.     Tenderness: There is no abdominal tenderness. There is left CVA tenderness. There is no right CVA tenderness, guarding or rebound.  Musculoskeletal:        General: Normal range of motion.     Cervical back: Normal range of motion and neck supple.  Skin:    General: Skin is warm and dry.  Neurological:     General: No focal deficit present.     Mental Status: She is alert and oriented to person, place, and time.     ED Results / Procedures / Treatments   Labs (all labs ordered are listed, but only abnormal results are  displayed) Labs Reviewed  URINALYSIS, ROUTINE W REFLEX MICROSCOPIC  CBC    EKG None  Radiology No results found.  Procedures Procedures    Medications Ordered in ED Medications  sodium chloride 0.9 % bolus 500 mL (has no administration in time range)  ondansetron (ZOFRAN) injection 4 mg (has no administration in time range)  morphine (PF) 4 MG/ML injection 4 mg (has no administration in time range)  ketorolac (TORADOL) 30 MG/ML injection 30 mg (has no administration in time range)    ED Course/ Medical Decision Making/ A&P  Patient is a 62 year old female presenting with left  flank pain as described in the HPI.  Patient arrives with stable vital signs and is afebrile.  She is clinically well-appearing.  There is some CVA tenderness on the left, but no other concerning physical findings.  Laboratory studies obtained including CBC and basic metabolic panel.  These are all unremarkable.  There is no white count and renal function is normal.  Urinalysis clear showing no evidence for infection.  CT with renal protocol obtained showing an 8 mm proximal ureteral stone on the left.  Patient has been given morphine and Toradol for pain along with Zofran for nausea and seems to be feeling more comfortable.  I feel as though patient can safely be discharged with outpatient follow-up with urology.  Patient to call Dr. Marlou Porch and arrange an appointment.  Final Clinical Impression(s) / ED Diagnoses Final diagnoses:  None    Rx / DC Orders ED Discharge Orders     None         Geoffery Lyons, MD 06/02/23 [id number]

## 2023-06-02 NOTE — Discharge Instructions (Signed)
Your urinalysis is clear showing no signs of infection.  Begin taking Percocet as prescribed as needed for pain and Zofran as prescribed as needed for nausea.  Begin taking Flomax as prescribed.  Follow-up with Dr. Marlou Porch in the next few days, and return to the ER if you develop significantly worsening pain, high fevers, or for other new and concerning symptoms.

## 2023-06-02 NOTE — Progress Notes (Signed)
Talked with patient. Instructions given. Arrival time 68. NPO after MN except a sip of water with B/P meds. Husband is the driver.will bring in CPAP and blue folder. Stopped [place] on Sunday  18/8/24

## 2023-06-02 NOTE — ED Triage Notes (Signed)
Pt coming in with complaint of severe right lower back/flank pain starting at midnight. Pt reports history of frequent kidney stones and was told a couple months ago she has at least 2 6mm stones in kidneys. Pt denies having urinated since waking at midnight and also denies feeling urge to urinate at this time. Pt also reports being treated recently with oral Cipro recently for salmonella and getting an injection of Rocephin yesterday due to diarrhea coming back after oral ATB completion.

## 2023-06-03 DIAGNOSIS — N200 Calculus of kidney: Secondary | ICD-10-CM | POA: Diagnosis not present

## 2023-06-05 ENCOUNTER — Encounter (HOSPITAL_BASED_OUTPATIENT_CLINIC_OR_DEPARTMENT_OTHER): Payer: Self-pay | Admitting: Urology

## 2023-06-05 ENCOUNTER — Ambulatory Visit (HOSPITAL_BASED_OUTPATIENT_CLINIC_OR_DEPARTMENT_OTHER)
Admission: RE | Admit: 2023-06-05 | Discharge: 2023-06-05 | Disposition: A | Discharge status: Home / Self Care | Payer: BC Managed Care – PPO | Attending: Urology | Admitting: Urology

## 2023-06-05 ENCOUNTER — Other Ambulatory Visit: Payer: Self-pay

## 2023-06-05 ENCOUNTER — Ambulatory Visit (HOSPITAL_COMMUNITY): Payer: BC Managed Care – PPO

## 2023-06-05 ENCOUNTER — Encounter (HOSPITAL_BASED_OUTPATIENT_CLINIC_OR_DEPARTMENT_OTHER)
Admission: RE | Disposition: A | Discharge status: Home / Self Care | Payer: Self-pay | Source: Home / Self Care | Attending: Urology

## 2023-06-05 DIAGNOSIS — E119 Type 2 diabetes mellitus without complications: Secondary | ICD-10-CM | POA: Insufficient documentation

## 2023-06-05 DIAGNOSIS — I5032 Chronic diastolic (congestive) heart failure: Secondary | ICD-10-CM | POA: Insufficient documentation

## 2023-06-05 DIAGNOSIS — N201 Calculus of ureter: Secondary | ICD-10-CM | POA: Diagnosis not present

## 2023-06-05 DIAGNOSIS — Z951 Presence of aortocoronary bypass graft: Secondary | ICD-10-CM | POA: Insufficient documentation

## 2023-06-05 DIAGNOSIS — N200 Calculus of kidney: Secondary | ICD-10-CM | POA: Diagnosis not present

## 2023-06-05 DIAGNOSIS — G4733 Obstructive sleep apnea (adult) (pediatric): Secondary | ICD-10-CM | POA: Diagnosis not present

## 2023-06-05 DIAGNOSIS — I110 Hypertensive heart disease with heart failure: Secondary | ICD-10-CM | POA: Diagnosis not present

## 2023-06-05 DIAGNOSIS — I878 Other specified disorders of veins: Secondary | ICD-10-CM | POA: Diagnosis not present

## 2023-06-05 HISTORY — PX: EXTRACORPOREAL SHOCK WAVE LITHOTRIPSY: SHX1557

## 2023-06-05 LAB — GLUCOSE, CAPILLARY
Glucose-Capillary: 152 mg/dL — ABNORMAL HIGH (ref 70–99)
Glucose-Capillary: 165 mg/dL — ABNORMAL HIGH (ref 70–99)

## 2023-06-05 SURGERY — LITHOTRIPSY, ESWL
Anesthesia: LOCAL | Laterality: Left

## 2023-06-05 MED ORDER — DIPHENHYDRAMINE HCL 25 MG PO CAPS
25.0000 mg | ORAL_CAPSULE | ORAL | Status: AC
Start: 2023-06-05 — End: 2023-06-05
  Administered 2023-06-05: 25 mg via ORAL

## 2023-06-05 MED ORDER — SODIUM CHLORIDE 0.9 % IV SOLN: INTRAVENOUS | Status: DC

## 2023-06-05 MED ORDER — DIAZEPAM 5 MG PO TABS
10.0000 mg | ORAL_TABLET | ORAL | Status: AC
  Administered 2023-06-05: 10 mg via ORAL

## 2023-06-05 MED ORDER — CIPROFLOXACIN HCL 500 MG PO TABS
ORAL_TABLET | ORAL | Status: AC
  Filled 2023-06-05: qty 1

## 2023-06-05 MED ORDER — DIPHENHYDRAMINE HCL 25 MG PO CAPS
ORAL_CAPSULE | ORAL | Status: AC
  Filled 2023-06-05: qty 1

## 2023-06-05 MED ORDER — CIPROFLOXACIN HCL 500 MG PO TABS
500.0000 mg | ORAL_TABLET | ORAL | Status: AC
Start: 2023-06-05 — End: 2023-06-05
  Administered 2023-06-05: 500 mg via ORAL

## 2023-06-05 MED ORDER — DIAZEPAM 5 MG PO TABS
ORAL_TABLET | ORAL | Status: AC
  Filled 2023-06-05: qty 2

## 2023-06-05 NOTE — H&P (Signed)
Jaime Nichols is an 62 y.o. female.    Chief Complaint: Pre-OP LEFT Shockwave Lithotripsy  HPI:   1 - LEFT UPJ Stone - LEFT 8mm UPJ stone by ER CT 05/2022 on eval flank pain. Stone at L3 area on CT scout films. Cr 0.8, UA without infectious parameters. Eval by Dr. Marlou Porch.   PMH sig for large obesity, DM2, Mild CHF, OSA/CPAP  Today "[name]" is seen to proceed with LEFT shockwave lithotripsy for LEFT UPJ stone. No interval fevers.   Past Medical History:  Diagnosis Date   Aortic atherosclerosis (HCC)    Asthma    CAD in native artery    very high calcium score at 875.  coronary CTA  showed 30% RCA and LAD.   Chronic diastolic CHF (congestive heart failure) (HCC) 2012   Diabetes mellitus type 2 in obese    Dyspnea    with exertion   Fatty liver    Fibroid    History of kidney stones 09/2019   HTN (hypertension)    Hyperlipidemia    Morbid obesity (HCC)    OSA on CPAP    PAT (paroxysmal atrial tachycardia) (HCC)    Premature atrial contractions    PVC's (premature ventricular contractions)     Past Surgical History:  Procedure Laterality Date   ABDOMINAL HYSTERECTOMY  06/20/2008   BSO   Benign uterine polyps  01/18/2009   CYSTOSCOPY W/ URETERAL STENT PLACEMENT Right 07/02/2021   Procedure: CYSTOSCOPY WITH RETROGRADE PYELOGRAM/URETERAL STENT PLACEMENT;  Surgeon: Belva Agee, MD;  Location: [facility];  Service: Urology;  Laterality: Right;   CYSTOSCOPY/URETEROSCOPY/HOLMIUM LASER/STENT PLACEMENT Right 07/28/2021   Procedure: RIGHT URETEROSCOPY/ RETROGRADE / HOLMIUM LASER/ STONE BASKETRY, STENT EXCHANGE;  Surgeon: Crist Fat, MD;  Location: [facility];  Service: Urology;  Laterality: Right;   DILATION AND CURETTAGE OF UTERUS  2010   HYSTEROSCOPY  2010   URETEROSCOPY WITH HOLMIUM LASER LITHOTRIPSY Left 2021    Family History  Problem Relation Age of Onset   Hypertension Mother    Heart disease Father        Died age 28s of a cardiac event - was  told it was atherosclerosis but also had PVCs   Healthy Sister    Arrhythmia Sister        PVCs   Depression Brother    Healthy Sister    Diabetes Paternal Grandmother    Arrhythmia Other        Distant relative with WPW.   Social History:  reports that she has never smoked. She has never used smokeless tobacco. She reports current alcohol use. She reports that she does not use drugs.  Allergies:  Allergies  Allergen Reactions   Erythromycin Diarrhea and Nausea And Vomiting    Other reaction(s): stomach upset   Latex Other (See Comments)    REACTION: "CONTACT DERMATITIS"   Nickel Other (See Comments)    Poor healing- dental reasons   Statins Other (See Comments)    Joint pain   Crestor [Rosuvastatin] Other (See Comments)    Cannot take when taking Colchicine   Dapagliflozin     Other reaction(s): worsening of urine incontinence   Metformin Hcl Diarrhea and Other (See Comments)    Diarrhea at higher doses, GI Upset   Potassium Citrate Other (See Comments)    Affected eyes- "redness and crusty" at 30 mEq/daily; pt states she has no reaction to the potassium citrate that she is taking now and that this only happened  when she was taking too much   Zocor [Simvastatin] Other (See Comments)    Joint pain    No medications prior to admission.    No results found for this or any previous visit (from the past 48 hours). No results found.  Review of Systems  Constitutional:  Negative for chills and fever.  Genitourinary:  Positive for flank pain.  All other systems reviewed and are negative.   There were no vitals taken for this visit. Physical Exam Vitals reviewed.  HENT:     Head: Normocephalic.  Eyes:     Pupils: Pupils are equal, round, and reactive to light.  Cardiovascular:     Rate and Rhythm: Normal rate.  Pulmonary:     Effort: Pulmonary effort is normal.  Abdominal:     Comments: Very large truncal obesity  Genitourinary:    Comments: Mild left CVAT at  present Musculoskeletal:        General: Normal range of motion.     Comments: Mild le edema  Skin:    General: Skin is warm.  Neurological:     General: No focal deficit present.  Psychiatric:        Mood and Affect: Mood normal.      Assessment/Plan  Proceed as planned with LEFT shockwave lithotripsy. RIsks, benefits, alternatives, expected treatmetn course discussed in detail. She will use CPAP with supplemental O2.   Loletta Parish., MD 06/05/2023, 7:31 AM

## 2023-06-05 NOTE — Discharge Instructions (Addendum)
1 - You may have urinary urgency (bladder spasms), pass small stone fragments, and bloody urine on / off for up to 2 weeks. This is normal.  2 - Call MD or go to ER for fever >102, severe pain / nausea / vomiting not relieved by medications, or acute change in medical status     Post Anesthesia Home Care Instructions  Activity: Get plenty of rest for the remainder of the day. A responsible individual must stay with you for 24 hours following the procedure.  For the next 24 hours, DO NOT: -Drive a car -Paediatric nurse -Drink alcoholic beverages -Take any medication unless instructed by your physician -Make any legal decisions or sign important papers.  Meals: Start with liquid foods such as gelatin or soup. Progress to regular foods as tolerated. Avoid greasy, spicy, heavy foods. If nausea and/or vomiting occur, drink only clear liquids until the nausea and/or vomiting subsides. Call your physician if vomiting continues.  Special Instructions/Symptoms: Your throat may feel dry or sore from the anesthesia or the breathing tube placed in your throat during surgery. If this causes discomfort, gargle with warm salt water. The discomfort should disappear within 24 hours.

## 2023-06-05 NOTE — Brief Op Note (Signed)
06/05/2023  12:30 PM  PATIENT:  Jaime Nichols  62 y.o. female  PRE-OPERATIVE DIAGNOSIS:  LEFT URETERAL STONE  POST-OPERATIVE DIAGNOSIS:  * No post-op diagnosis entered *  PROCEDURE:  Procedure(s): LEFT EXTRACORPOREAL SHOCK WAVE LITHOTRIPSY (ESWL) (Left)  SURGEON:  Surgeons and Role:    * Tyreonna Czaplicki, Delbert Phenix., MD - Primary  PHYSICIAN ASSISTANT:   ASSISTANTS: none   ANESTHESIA:   MAC  EBL:  minimal   BLOOD ADMINISTERED:none  DRAINS: none   LOCAL MEDICATIONS USED:  NONE  SPECIMEN:  No Specimen  DISPOSITION OF SPECIMEN:  N/A  COUNTS:  YES  TOURNIQUET:  * No tourniquets in log *  DICTATION: .Note written in paper chart  PLAN OF CARE: Discharge to home after PACU  PATIENT DISPOSITION:  Short Stay   Delay start of Pharmacological VTE agent (>24hrs) due to surgical blood loss or risk of bleeding: not applicable

## 2023-06-06 ENCOUNTER — Encounter (HOSPITAL_BASED_OUTPATIENT_CLINIC_OR_DEPARTMENT_OTHER): Payer: Self-pay | Admitting: Urology

## 2023-06-07 LAB — MISCELLANEOUS TEST

## 2023-06-12 DIAGNOSIS — A020 Salmonella enteritis: Secondary | ICD-10-CM | POA: Diagnosis not present

## 2023-06-15 DIAGNOSIS — A020 Salmonella enteritis: Secondary | ICD-10-CM | POA: Diagnosis not present

## 2023-06-22 DIAGNOSIS — N200 Calculus of kidney: Secondary | ICD-10-CM | POA: Diagnosis not present

## 2023-06-23 ENCOUNTER — Other Ambulatory Visit: Payer: Self-pay | Admitting: Cardiology

## 2023-06-23 ENCOUNTER — Ambulatory Visit (INDEPENDENT_AMBULATORY_CARE_PROVIDER_SITE_OTHER): Payer: BC Managed Care – PPO | Admitting: *Deleted

## 2023-06-23 DIAGNOSIS — B351 Tinea unguium: Secondary | ICD-10-CM

## 2023-06-23 DIAGNOSIS — N200 Calculus of kidney: Secondary | ICD-10-CM | POA: Diagnosis not present

## 2023-06-23 DIAGNOSIS — I2510 Atherosclerotic heart disease of native coronary artery without angina pectoris: Secondary | ICD-10-CM

## 2023-06-23 DIAGNOSIS — E785 Hyperlipidemia, unspecified: Secondary | ICD-10-CM

## 2023-06-23 NOTE — Progress Notes (Signed)
 Patient presents today for round 2 of lasering of toenails treatment # 3  (or #10 total treatments)  ( already completed one round of 7 treatments)    She has completed course of terbinafine .   Diagnosed with mycotic nail infection by Dr. Magdalen Done most affected are right third toenail and fifth toenail.   All other systems are negative.  Nails on the right foot were trimmed and filed thin. Laser therapy was administered to 1-5 toenails right and patient tolerated the treatment well. All safety precautions were in place.   Single laser pass was done on non-affected nails of the right foot as well    Follow up in 6 weeks for laser # 4  (#11 total)

## 2023-06-23 NOTE — Patient Instructions (Signed)
 4

## 2023-06-28 ENCOUNTER — Encounter: Payer: Self-pay | Admitting: Internal Medicine

## 2023-06-28 ENCOUNTER — Ambulatory Visit (INDEPENDENT_AMBULATORY_CARE_PROVIDER_SITE_OTHER): Payer: BC Managed Care – PPO | Admitting: Internal Medicine

## 2023-06-28 ENCOUNTER — Other Ambulatory Visit: Payer: Self-pay

## 2023-06-28 VITALS — BP 121/77 | HR 90 | Temp 97.7°F | Ht 62.5 in | Wt 250.0 lb

## 2023-06-28 DIAGNOSIS — A09 Infectious gastroenteritis and colitis, unspecified: Secondary | ICD-10-CM | POA: Diagnosis not present

## 2023-06-28 DIAGNOSIS — R197 Diarrhea, unspecified: Secondary | ICD-10-CM | POA: Insufficient documentation

## 2023-06-28 NOTE — Progress Notes (Signed)
 [facility]      Reason for Consult: gastroenteritis    Referring Physician: JAYSON Bright PA    Patient ID: Jaime Nichols, female    DOB: [birth year], 63 y.o.   MRN: [id number]  HPI:   [name] is Here for evaluation of recent gastroenteritis diagnosed with Salmonella. She went to the emergency room initially with severe diarrhea and abdominal cramping and GI pathogen panel positive for Salmonella and she was given IV Rocephin  and oral ciprofloxacin .  She continues to have some diarrhea and pain in got another course of azithromycin and also had some complications with kidney stones.  Blood in her diarrhea.  The health department did investigate and found her to have Salmonella [place] .  Addendum furcation of any outbreak that she was aware of.  She continues to have loose stools but much improved.   Past Medical History:  Diagnosis Date   Aortic atherosclerosis (HCC)    Asthma    CAD in native artery    very high calcium  score at 875.  coronary CTA  showed 30% RCA and LAD.   Chronic diastolic CHF (congestive heart failure) (HCC) 2012   Diabetes mellitus type 2 in obese    Dyspnea    with exertion   Fatty liver    Fibroid    History of kidney stones 09/2019   HTN (hypertension)    Hyperlipidemia    Morbid obesity (HCC)    OSA on CPAP    PAT (paroxysmal atrial tachycardia) (HCC)    Premature atrial contractions    PVC's (premature ventricular contractions)     Prior to Admission medications   Medication Sig Start Date End Date Taking? Authorizing Provider  acetaminophen  (TYLENOL ) 500 MG tablet Take 500-1,000 mg by mouth every 6 (six) hours as needed for headache (or pain).   Yes [provider]  allopurinol  (ZYLOPRIM ) 300 MG tablet Take 300 mg by mouth daily. 09/20/21  Yes [provider]  celecoxib (CELEBREX) 200 MG capsule Take 200 mg by mouth as needed.   Yes [provider]  colchicine 0.6 MG tablet as needed.  09/07/21  Yes [provider]  D 1000 25 MCG (1000 UT) capsule Take 1,000 Units by mouth 3 (three) times a week. 09/07/21  Yes [provider]  dicyclomine  (BENTYL ) 20 MG tablet Take 1 tablet (20 mg total) by mouth 2 (two) times daily. 05/10/23  Yes Clark, Meghan R, PA-C  diltiazem  (CARDIZEM  CD) 120 MG 24 hr capsule TAKE 1 CAPSULE BY MOUTH EVERY DAY 07/08/22  Yes Turner, Wilbert SAUNDERS, MD  erythromycin ophthalmic ointment Place 1 Application into both eyes as needed.   Yes [provider]  estradiol  (ESTRACE ) 0.1 MG/GM vaginal cream Place 1 Applicatorful vaginally every 14 (fourteen) days. 11/24/20  Yes [provider]  Evolocumab  (REPATHA  SURECLICK) 140 MG/ML SOAJ INJECT 1 PEN INTO THE SKIN EVERY 14 (FOURTEEN) DAYS. 06/23/23  Yes Turner, Traci R, MD  fluconazole  (DIFLUCAN ) 150 MG tablet Take 150 mg by mouth as needed.   Yes [provider]  fluconazole  (DIFLUCAN ) 150 MG tablet Take 1 tablet (150 mg total) by mouth once a week. 04/13/23  Yes Regal, [place] RAMAN, DPM  glimepiride  (AMARYL ) 4 MG tablet Take 2 mg by mouth every morning. 04/21/22  Yes [provider]  levalbuterol  (XOPENEX  HFA) 45 MCG/ACT inhaler Inhale 1-2 puffs into the lungs every 4 (four) hours as needed for shortness of breath. 09/27/18  Yes [provider]  lisinopril  (ZESTRIL ) 10 MG tablet Take 1 tablet (10 mg total) by mouth daily. 05/04/21  Yes Turner, Wilbert SAUNDERS, MD  magnesium  gluconate (MAGONATE) 500 MG tablet Take 500 mg by mouth 2 (two) times daily.   Yes [provider]  metFORMIN  (GLUCOPHAGE -XR) 500 MG 24 hr tablet Take 500 mg by mouth daily with breakfast.   Yes [provider]  metoprolol  succinate (TOPROL -XL) 25 MG 24 hr tablet TAKE 1 TABLET (25 MG TOTAL) BY MOUTH DAILY. 05/16/22  Yes Shlomo Wilbert SAUNDERS, MD  ondansetron  (ZOFRAN -ODT) 8 MG disintegrating tablet 8mg  ODT q4 hours prn nausea 06/02/23  Yes Delo, Vicenta, MD  oxyCODONE -acetaminophen  (PERCOCET) 5-325 MG  tablet Take 1-2 tablets by mouth every 6 (six) hours as needed. 06/02/23  Yes Delo, Vicenta, MD  Potassium Citrate  15 MEQ (1620 MG) TBCR Take 15 mEq by mouth 3 (three) times a week. 06/04/21  Yes [provider]  PRESCRIPTION MEDICATION CPAP- At bedtime   Yes [provider]  tamsulosin  (FLOMAX ) 0.4 MG CAPS capsule Take 1 capsule (0.4 mg total) by mouth daily. 06/02/23  Yes Geroldine Vicenta, MD  Taurine 1000 MG CAPS Take 100 mg by mouth in the morning, at noon, and at bedtime.   Yes [provider]  terbinafine  (LAMISIL ) 250 MG tablet Take 1 tablet (250 mg total) by mouth daily. 04/13/23  Yes Regal, Pas[place] RAMAN, DPM  terconazole  (TERAZOL 7 ) 0.4 % vaginal cream Place 1 applicator vaginally as needed. 08/06/21  Yes [provider]  tirzepatide CLOYDE) 12.5 MG/0.5ML Pen Inject 2.5 mg into the skin once a week. 04/12/22  Yes [provider]  tiZANidine  (ZANAFLEX ) 4 MG tablet Take 0.5 tablets (2 mg total) by mouth every 6 (six) hours as needed for muscle spasms. 07/28/21  Yes Cam Morene ORN, MD    Allergies  Allergen Reactions   Erythromycin Diarrhea and Nausea And Vomiting    Other reaction(s): stomach upset   Latex Other (See Comments)    REACTION: CONTACT DERMATITIS   Nickel Other (See Comments)    Poor healing- dental reasons   Statins Other (See Comments)    Joint pain   Crestor  [Rosuvastatin ] Other (See Comments)    Cannot take when taking Colchicine   Dapagliflozin     Other reaction(s): worsening of urine incontinence   Metformin  Hcl Diarrhea and Other (See Comments)    Diarrhea at higher doses, GI Upset   Potassium Citrate  Other (See Comments)    Affected eyes- redness and crusty at 30 mEq/daily; pt states she has no reaction to the potassium citrate  that she is taking now and that this only happened when she was taking too much   Zocor [Simvastatin] Other (See Comments)    Joint pain    Social History   Tobacco Use   Smoking  status: Never   Smokeless tobacco: Never  Vaping Use   Vaping status: Never Used  Substance Use Topics   Alcohol use: Yes    Comment: occ.   Drug use: No    Family History  Problem Relation Age of Onset   Hypertension Mother    Heart disease Father        Died age 35s of a cardiac event - was told it was atherosclerosis but also had PVCs   Healthy Sister    Arrhythmia Sister        PVCs   Depression Brother    Healthy Sister    Diabetes Paternal Grandmother    Arrhythmia Other  Distant relative with WPW.     Review of Systems  Constitutional: negative for fevers and chills All other systems reviewed and are negative    Constitutional: in no apparent distress There were no vitals filed for this visit. EYES: anicteric Respiratory: Normal respiratory effort HP:dnqu  Labs: Lab Results  Component Value Date   WBC 10.8 (H) 06/02/2023   HGB 13.1 06/02/2023   HCT 39.8 06/02/2023   MCV 87.7 06/02/2023   PLT 267 06/02/2023    Lab Results  Component Value Date   CREATININE 0.72 06/02/2023   BUN 15 06/02/2023   NA 138 06/02/2023   K 4.5 06/02/2023   CL 103 06/02/2023   CO2 25 06/02/2023    Lab Results  Component Value Date   ALT 16 06/02/2023   AST 16 06/02/2023   ALKPHOS 84 06/02/2023   BILITOT 0.5 06/02/2023   INR 1.0 07/31/2021     Assessment: She had Salmonella enteritis with Salmonella [place] .  This is largely resolved.  She is feeling better.  I discussed with her cover he can take weeks to months in regards to diet, stool formation and bloating symptoms.  She can continue with supportive care.  Continue to eat a normal diet though recommended a more bland diet initially.  No further antibiotics indicated.  Plan: 1) continue supportive care.

## 2023-06-29 DIAGNOSIS — M170 Bilateral primary osteoarthritis of knee: Secondary | ICD-10-CM | POA: Diagnosis not present

## 2023-06-29 DIAGNOSIS — M25562 Pain in left knee: Secondary | ICD-10-CM | POA: Diagnosis not present

## 2023-07-09 DIAGNOSIS — M25562 Pain in left knee: Secondary | ICD-10-CM | POA: Diagnosis not present

## 2023-07-14 DIAGNOSIS — M1712 Unilateral primary osteoarthritis, left knee: Secondary | ICD-10-CM | POA: Diagnosis not present

## 2023-07-24 DIAGNOSIS — N200 Calculus of kidney: Secondary | ICD-10-CM | POA: Diagnosis not present

## 2023-07-27 DIAGNOSIS — I509 Heart failure, unspecified: Secondary | ICD-10-CM | POA: Diagnosis not present

## 2023-07-27 DIAGNOSIS — Z87442 Personal history of urinary calculi: Secondary | ICD-10-CM | POA: Diagnosis not present

## 2023-07-27 DIAGNOSIS — E1165 Type 2 diabetes mellitus with hyperglycemia: Secondary | ICD-10-CM | POA: Diagnosis not present

## 2023-07-31 ENCOUNTER — Ambulatory Visit (INDEPENDENT_AMBULATORY_CARE_PROVIDER_SITE_OTHER): Payer: BC Managed Care – PPO | Admitting: Podiatrist

## 2023-07-31 DIAGNOSIS — B351 Tinea unguium: Secondary | ICD-10-CM

## 2023-07-31 DIAGNOSIS — L603 Nail dystrophy: Secondary | ICD-10-CM | POA: Diagnosis not present

## 2023-07-31 NOTE — Progress Notes (Signed)
 Patient presents today for round 2 of lasering of toenails treatment # 4  (or #11 total treatments)  ( already completed one round of 7 treatments)    She has completed course of terbinafine .   Diagnosed with mycotic nail infection by Dr. Hannah Lewis most affected are right third toenail   All other systems are negative.  Discussed the problem with the third toenail and her treatments so far-  discussed that the nail could be damaged and laser wont be beneficial in helping with the appearance if the nail root is affected.   She asked if we could send the nail off for culture to see if fungal elements are present.  I agreed and took a sample of the nail and will send to [facility] for evaluation.       Follow up in 6 weeks for laser # 5  (#12 total)

## 2023-08-03 DIAGNOSIS — R7989 Other specified abnormal findings of blood chemistry: Secondary | ICD-10-CM | POA: Diagnosis not present

## 2023-08-03 DIAGNOSIS — M19042 Primary osteoarthritis, left hand: Secondary | ICD-10-CM | POA: Diagnosis not present

## 2023-08-03 DIAGNOSIS — E539 Vitamin B deficiency, unspecified: Secondary | ICD-10-CM | POA: Diagnosis not present

## 2023-08-03 DIAGNOSIS — R5383 Other fatigue: Secondary | ICD-10-CM | POA: Diagnosis not present

## 2023-08-03 DIAGNOSIS — Z6841 Body Mass Index (BMI) 40.0 and over, adult: Secondary | ICD-10-CM | POA: Diagnosis not present

## 2023-08-14 ENCOUNTER — Other Ambulatory Visit: Payer: Self-pay | Admitting: Podiatrist

## 2023-08-15 ENCOUNTER — Other Ambulatory Visit: Payer: Self-pay | Admitting: Cardiology

## 2023-08-17 DIAGNOSIS — M6281 Muscle weakness (generalized): Secondary | ICD-10-CM | POA: Diagnosis not present

## 2023-08-17 DIAGNOSIS — N393 Stress incontinence (female) (male): Secondary | ICD-10-CM | POA: Diagnosis not present

## 2023-08-17 DIAGNOSIS — N3942 Incontinence without sensory awareness: Secondary | ICD-10-CM | POA: Diagnosis not present

## 2023-08-18 ENCOUNTER — Encounter: Payer: Self-pay | Admitting: Podiatrist

## 2023-08-25 ENCOUNTER — Ambulatory Visit: Payer: BC Managed Care – PPO

## 2023-08-25 DIAGNOSIS — B351 Tinea unguium: Secondary | ICD-10-CM

## 2023-08-25 NOTE — Progress Notes (Signed)
 Patient presents today for the 5th (12th)  laser treatment. Diagnosed with mycotic nail infection by Dr. Charlsie Merles.   Toenail most affected Right 3rd.  All other systems are negative.  Nails were filed thin. Laser therapy was administered to 3rd toenail right foot and patient tolerated the treatment well. All safety precautions were in place.     Follow up with Dr. Charlsie Merles before next laser treatment.

## 2023-08-28 DIAGNOSIS — L650 Telogen effluvium: Secondary | ICD-10-CM | POA: Diagnosis not present

## 2023-08-28 DIAGNOSIS — R002 Palpitations: Secondary | ICD-10-CM | POA: Diagnosis not present

## 2023-08-28 DIAGNOSIS — E785 Hyperlipidemia, unspecified: Secondary | ICD-10-CM | POA: Diagnosis not present

## 2023-08-28 DIAGNOSIS — I10 Essential (primary) hypertension: Secondary | ICD-10-CM | POA: Diagnosis not present

## 2023-08-29 DIAGNOSIS — L650 Telogen effluvium: Secondary | ICD-10-CM | POA: Diagnosis not present

## 2023-08-29 DIAGNOSIS — L821 Other seborrheic keratosis: Secondary | ICD-10-CM | POA: Diagnosis not present

## 2023-08-29 DIAGNOSIS — L820 Inflamed seborrheic keratosis: Secondary | ICD-10-CM | POA: Diagnosis not present

## 2023-08-29 DIAGNOSIS — L853 Xerosis cutis: Secondary | ICD-10-CM | POA: Diagnosis not present

## 2023-08-31 ENCOUNTER — Encounter: Payer: Self-pay | Admitting: Podiatry

## 2023-08-31 ENCOUNTER — Ambulatory Visit (INDEPENDENT_AMBULATORY_CARE_PROVIDER_SITE_OTHER): Admitting: Podiatry

## 2023-08-31 DIAGNOSIS — B351 Tinea unguium: Secondary | ICD-10-CM | POA: Diagnosis not present

## 2023-08-31 MED ORDER — TERBINAFINE HCL 250 MG PO TABS: 250.0000 mg | ORAL_TABLET | Freq: Every day | ORAL | 0 refills | Status: DC

## 2023-09-01 NOTE — Progress Notes (Signed)
 Subjective:   Patient ID: Jaime Nichols, female   DOB: 63 y.o.   MRN: [id number]   HPI Patient states she is still having problems with the third nail of her right foot and states that she is not able to take the oral medicine as she had a stomach infection.  She is willing to try to do anything to get it better   ROS      Objective:  Physical Exam  Neurovascular status intact with patient noted to have good digital perfusion.  Patient is noted to have a thickened third nailbed right distal one quarter of the bed with relative health in the proximal portion     Assessment:  Good chance given the treatments she has done so far the trauma is a big cause of this problem versus fungus     Plan:  H&P spent a great deal of time going over this and explaining this to her and trying to explain that there is no guarantee that anything conservative will work.  At this point we [place] to try a course of oral she has approximately 35 pills and I wrote for 30 more and I instructed on what to do if any issues were to occur.  If not better we did discuss possible nail removal in future of the permanent nature and I educated her on this

## 2023-09-11 DIAGNOSIS — M25641 Stiffness of right hand, not elsewhere classified: Secondary | ICD-10-CM | POA: Diagnosis not present

## 2023-09-11 DIAGNOSIS — M79645 Pain in left finger(s): Secondary | ICD-10-CM | POA: Diagnosis not present

## 2023-09-11 DIAGNOSIS — M20032 Swan-neck deformity of left finger(s): Secondary | ICD-10-CM | POA: Diagnosis not present

## 2023-09-18 ENCOUNTER — Other Ambulatory Visit (HOSPITAL_COMMUNITY): Payer: Self-pay

## 2023-09-18 ENCOUNTER — Telehealth: Payer: Self-pay

## 2023-09-18 DIAGNOSIS — M6281 Muscle weakness (generalized): Secondary | ICD-10-CM | POA: Diagnosis not present

## 2023-09-18 DIAGNOSIS — N3946 Mixed incontinence: Secondary | ICD-10-CM | POA: Diagnosis not present

## 2023-09-18 DIAGNOSIS — M6289 Other specified disorders of muscle: Secondary | ICD-10-CM | POA: Diagnosis not present

## 2023-09-18 DIAGNOSIS — M62838 Other muscle spasm: Secondary | ICD-10-CM | POA: Diagnosis not present

## 2023-09-18 NOTE — Telephone Encounter (Signed)
 Pharmacy Patient Advocate Encounter   Received notification from CoverMyMeds that prior authorization for REPATHA is required/requested.   Insurance verification completed.   The patient is insured through [facility] .   Per test claim: PA required; PA submitted to above mentioned insurance via CoverMyMeds Key/confirmation #/EOC [facility] Status is pending

## 2023-09-19 ENCOUNTER — Other Ambulatory Visit (HOSPITAL_COMMUNITY): Payer: Self-pay

## 2023-09-19 NOTE — Telephone Encounter (Signed)
 Pharmacy Patient Advocate Encounter  Received notification from [facility] that Prior Authorization for REPATHA has been APPROVED from 09/18/23 to 09/17/24

## 2023-09-20 DIAGNOSIS — M170 Bilateral primary osteoarthritis of knee: Secondary | ICD-10-CM | POA: Diagnosis not present

## 2023-09-28 DIAGNOSIS — M170 Bilateral primary osteoarthritis of knee: Secondary | ICD-10-CM | POA: Diagnosis not present

## 2023-10-04 DIAGNOSIS — M170 Bilateral primary osteoarthritis of knee: Secondary | ICD-10-CM | POA: Diagnosis not present

## 2023-10-09 DIAGNOSIS — Z6841 Body Mass Index (BMI) 40.0 and over, adult: Secondary | ICD-10-CM | POA: Diagnosis not present

## 2023-10-09 DIAGNOSIS — L989 Disorder of the skin and subcutaneous tissue, unspecified: Secondary | ICD-10-CM | POA: Diagnosis not present

## 2023-10-09 DIAGNOSIS — R4589 Other symptoms and signs involving emotional state: Secondary | ICD-10-CM | POA: Diagnosis not present

## 2023-11-07 DIAGNOSIS — N3946 Mixed incontinence: Secondary | ICD-10-CM | POA: Diagnosis not present

## 2023-11-07 DIAGNOSIS — M62838 Other muscle spasm: Secondary | ICD-10-CM | POA: Diagnosis not present

## 2023-11-07 DIAGNOSIS — M6281 Muscle weakness (generalized): Secondary | ICD-10-CM | POA: Diagnosis not present

## 2023-11-07 DIAGNOSIS — M6289 Other specified disorders of muscle: Secondary | ICD-10-CM | POA: Diagnosis not present

## 2023-11-21 DIAGNOSIS — M62838 Other muscle spasm: Secondary | ICD-10-CM | POA: Diagnosis not present

## 2023-11-21 DIAGNOSIS — M6289 Other specified disorders of muscle: Secondary | ICD-10-CM | POA: Diagnosis not present

## 2023-11-21 DIAGNOSIS — N3946 Mixed incontinence: Secondary | ICD-10-CM | POA: Diagnosis not present

## 2023-11-21 DIAGNOSIS — M6281 Muscle weakness (generalized): Secondary | ICD-10-CM | POA: Diagnosis not present

## 2023-11-29 NOTE — Progress Notes (Signed)
 Cardiology Office Note    Date:  11/30/2023  ID:  Jaime Nichols, DOB 27-Dec-1960, MRN [id number] PCP:  Helyn Lobstein, MD  Cardiologist:  Gaylyn Keas, MD  Electrophysiologist:  None   Chief Complaint: occasional thumping in chest  History of Present Illness: .    Jaime Nichols is a 63 y.o. female with visit-pertinent history of OSA (followed by Dr. Micael Adas), HTN, chronic diastolic CHF, DM, nonsustained atrial tach, PACs, PVCs, nonobstructive CAD by cor CT 2020, aortic atherosclerosis, recurrent kidney stones, old granulomatous disease and fatty liver by prior imaging, strong family history of early CV disease, morbid obesity, possible asthma who presents for follow-up.   She has a longstanding history of dyspnea with exertion and exaggerated HR response with activity even back to her 20s. Her SOB has been felt multifactorial from obesity, pectus excavatum and possible asthma, also followed by pulmonology. When seen in 2019 she noted heart racing and dyspnea with minimal activity which was replicated in clinic. D-dimer was normal adjusted for age and nuclear stress test was reassuring. CXR showed borderline cardiomegaly, aortic atherosclerosis, calcified mediastinal lymph nodes, clear lungs. She did not want to titrate metoprolol  so diltiazem  was added which helped. She was counseled on need for weight loss. CPX showed normal functional capacity with significant limitation due to severe obesity and related restrictive properties. She had an exaggerated heart rate and hypertensive response to exercise with mild oxygen desaturations to a low of 91% at peak exercise. High res CT scan 11/2017 by pulmonology saw calcified lymph nodes but no evidence of interstitial lung disease. PFTs 12/2017 showed small airways obstruction, mild restriction with a low ERV, reported as can be seen in poor patient effort, neuromuscular weakness or obesity. Coronary CTA 08/2018 showed CAC 846 in 99%ile but  otherwise mild nonobstructive CAD. This also saw the calcified lymph nodes once more reported due to old granulomatous disease. Nuclear stress test in 03/2020 was normal. She is not on ASA as it has previously tended to exacerbate her interstitial cystitis per patient report. She has not previously been able to tolerate SGLT2i per notes, has had periodic prior abx, fungal rx. She has worn several monitors in the past, with findings showing a combination of NSR, sinus tach, PVCs, PACs, and nonsustained atrial tach - the last of which in 04/2020 showed <1% PVCs. Last echo in 06/2021 showed EF 60-65%, normal diastolic function, normal RV function, mild LAE, no significant valvular disease. She is not on statin due to myalgias and drug interaction with colchicine, on PCSK9i instead. Hydrochlorothiazide previously stopped due to uric acid kidney stones.  She returns for follow-up. She notes that she was very ill at the end of 2024 with Salmonella requiring multiple rounds of abx. It took her 3 months to feel like herself again. She has noticed some LLE edema - historically R foot tends to be the puffy one but now seen in L foot as well. For the past 2 weeks she has been noticing occasional thumping sensation in her chest similar to when she had the prior arrhythmias diagnosed. She has checked her VS with pulse ox with normal findings during these times but had not checked BP during an episode. We discussed the questions she had outlined for the visit today.  Labwork independently reviewed: 05/2023 K 4.5, Cr 0.72, LFTs ok, H/H/plt ok 10/2022 TSH ok, LDL 67, trig 171 2018 Mg wnl   ROS: .    Please see the history of present illness. Not  specifically reporting increased DOE or chest pain. All other systems are reviewed and otherwise negative.  Studies Reviewed: Aaron Aas    EKG:  EKG is ordered today, personally reviewed, demonstrating:  EKG Interpretation Date/Time:  Thursday November 30 2023 11:27:00 EDT Ventricular  Rate:  90 PR Interval:  196 QRS Duration:  90 QT Interval:  348 QTC Calculation: 425 R Axis:   -64  Text Interpretation: Normal sinus rhythm Left axis deviation Nonspecific TWI in lead III Poor R wave progression similar to prior No acute changes Confirmed by Xeng Kucher [id number]) on 11/30/2023 11:43:45 AM    CV Studies: Cardiac studies reviewed are outlined and summarized above. Otherwise please see EMR for full report.   Current Reported Medications:.    Current Meds  Medication Sig   allopurinol  (ZYLOPRIM ) 300 MG tablet Take 300 mg by mouth daily.   colchicine 0.6 MG tablet as needed.   D 1000 25 MCG (1000 UT) capsule Take 1,000 Units by mouth 3 (three) times a week.   diltiazem  (CARDIZEM  CD) 120 MG 24 hr capsule TAKE 1 CAPSULE BY MOUTH EVERY DAY   erythromycin ophthalmic ointment Place 1 Application into both eyes as needed.   estradiol  (ESTRACE ) 0.1 MG/GM vaginal cream Place 1 Applicatorful vaginally every 14 (fourteen) days.   Evolocumab  (REPATHA  SURECLICK) 140 MG/ML SOAJ INJECT 1 PEN INTO THE SKIN EVERY 14 (FOURTEEN) DAYS.   fluconazole  (DIFLUCAN ) 150 MG tablet Take 150 mg by mouth as needed.   glimepiride  (AMARYL ) 4 MG tablet Take 2 mg by mouth every morning.   levalbuterol  (XOPENEX  HFA) 45 MCG/ACT inhaler Inhale 1-2 puffs into the lungs every 4 (four) hours as needed for shortness of breath.   lisinopril  (ZESTRIL ) 10 MG tablet Take 1 tablet (10 mg total) by mouth daily.   magnesium gluconate (MAGONATE) 500 MG tablet Take 500 mg by mouth 2 (two) times daily.   metFORMIN  (GLUCOPHAGE -XR) 500 MG 24 hr tablet Take 500 mg by mouth daily with breakfast.   metoprolol  succinate (TOPROL -XL) 25 MG 24 hr tablet TAKE 1 TABLET (25 MG TOTAL) BY MOUTH DAILY.   Potassium Citrate 15 MEQ (1620 MG) TBCR Take 15 mEq by mouth 3 (three) times a week.   PRESCRIPTION MEDICATION CPAP- At bedtime   tamsulosin  (FLOMAX ) 0.4 MG CAPS capsule Take 1 capsule (0.4 mg total) by mouth daily.   terbinafine   (LAMISIL ) 250 MG tablet Take 1 tablet (250 mg total) by mouth daily.   terconazole  (TERAZOL 7 ) 0.4 % vaginal cream Place 1 applicator vaginally as needed.   tirzepatide (MOUNJARO) 12.5 MG/0.5ML Pen Inject 2.5 mg into the skin once a week.   tiZANidine  (ZANAFLEX ) 4 MG tablet Take 0.5 tablets (2 mg total) by mouth every 6 (six) hours as needed for muscle spasms.    Physical Exam:    VS:  BP 122/68 (BP Location: Right Arm, Patient Position: Sitting, Cuff Size: Large)   Pulse 90   Ht 5' 2 (1.575 m)   Wt 249 lb (112.9 kg)   SpO2 96%   BMI 45.54 kg/m    Wt Readings from Last 3 Encounters:  11/30/23 249 lb (112.9 kg)  06/28/23 250 lb (113.4 kg)  06/05/23 249 lb (112.9 kg)    GEN: Well nourished, well developed in no acute distress NECK: No JVD; No carotid bruits CARDIAC: RRR, no murmurs, rubs, gallops RESPIRATORY:  Clear to auscultation without rales, wheezing or rhonchi  ABDOMEN: Soft, non-tender, non-distended EXTREMITIES:  Trace pedal edema indentations with sandals bilaterally; No acute deformity.  No erythema or significant higher leg edema.  Asessement and Plan:.    1. CAD, HLD - she is not on ASA due to prior exacerbation of IC per her report as above. No specific intensifying anginal symptoms. She is due for lipid follow-up and will return fasting for lipid panel and CMET. Per our discussion will also obtain Lp(a) given family history of CV disease.   2. Chronic HFpEF with HTN - mild pedal edema noted on exam, normotensive. Has similar history of intermittent edema. No other sx to suggest VTE. Weight stable. She would like to pursue updated echocardiogram, will arrange. Obtain f/u CMET, CBC, BNP when she returns for labs. Could consider changing lisinopril  to spironolactone if mild swelling persists. Diltiazem , BMI also perhaps contributing. She was previously taken off hydrochlorothiazide due to uric acid kidney stones.  3. Palpitations with history of PACs, PVCs, atrial  tachycardia -she would like to begin checking her BP during episodes of palpitations prior to making any medicine changes. She will relay these readings after she has several days of data. She is not excited about the idea of a repeat monitor but willing to try if needed. Per shared decision making, we'll start with collection of information above with potential plan to increase diltiazem  if VS will allow. Suspect we could have her hold her lisinopril  if there is concern for low BPs making this change. Will also get TSH, Mg when she returns for labs.  4. Family history of stroke - she inquires about carotid screening. Has family hx of premature stroke. Will arrange carotid duplex for evaluation.     Disposition: She is coming due for OSA follow-up as well, so we will have her follow-up with Dr. Micael Adas within 3 months then me in 6 months.  Signed, Jodel Mayhall N Tyreak Reagle, PA-C

## 2023-11-30 ENCOUNTER — Ambulatory Visit: Payer: BC Managed Care – PPO | Attending: Physician Assistant | Admitting: Physician Assistant

## 2023-11-30 ENCOUNTER — Encounter: Payer: Self-pay | Admitting: Physician Assistant

## 2023-11-30 VITALS — BP 122/68 | HR 90 | Ht 62.0 in | Wt 249.0 lb

## 2023-11-30 DIAGNOSIS — R600 Localized edema: Secondary | ICD-10-CM

## 2023-11-30 DIAGNOSIS — I5032 Chronic diastolic (congestive) heart failure: Secondary | ICD-10-CM

## 2023-11-30 DIAGNOSIS — I491 Atrial premature depolarization: Secondary | ICD-10-CM

## 2023-11-30 DIAGNOSIS — I4719 Other supraventricular tachycardia: Secondary | ICD-10-CM

## 2023-11-30 DIAGNOSIS — I2510 Atherosclerotic heart disease of native coronary artery without angina pectoris: Secondary | ICD-10-CM | POA: Diagnosis not present

## 2023-11-30 DIAGNOSIS — E785 Hyperlipidemia, unspecified: Secondary | ICD-10-CM | POA: Diagnosis not present

## 2023-11-30 DIAGNOSIS — I493 Ventricular premature depolarization: Secondary | ICD-10-CM

## 2023-11-30 DIAGNOSIS — R002 Palpitations: Secondary | ICD-10-CM

## 2023-11-30 DIAGNOSIS — Z823 Family history of stroke: Secondary | ICD-10-CM

## 2023-11-30 DIAGNOSIS — I10 Essential (primary) hypertension: Secondary | ICD-10-CM

## 2023-11-30 NOTE — Patient Instructions (Signed)
 Medication Instructions:   No changes made today  *If you need a refill on your cardiac medications before your next appointment, please call your pharmacy*  Lab Work:  Return for fasting labs at your convenience - CMET, CBC, Magnesium, TSH, lipid panel and lipoprotein (a)  If you have labs (blood work) drawn today and your tests are completely normal, you will receive your results only by: MyChart Message (if you have MyChart) OR A paper copy in the mail If you have any lab test that is abnormal or we need to change your treatment, we will call you to review the results.  Testing/Procedures: Your physician has requested that you have an echocardiogram. Echocardiography is a painless test that uses sound waves to create images of your heart. It provides your doctor with information about the size and shape of your heart and how well your heart's chambers and valves are working. This procedure takes approximately one hour. There are no restrictions for this procedure. Please do NOT wear cologne, perfume, aftershave, or lotions (deodorant is allowed). Please arrive 15 minutes prior to your appointment time.  Your physician has requested that you have a carotid duplex. This test is an ultrasound of the carotid arteries in your neck. It looks at blood flow through these arteries that supply the brain with blood. Allow one hour for this exam. There are no restrictions or special instructions.  Please note: We ask at that you not bring children with you during ultrasound (echo/ vascular) testing. Due to room size and safety concerns, children are not allowed in the ultrasound rooms during exams. Our front office staff cannot provide observation of children in our lobby area while testing is being conducted. An adult accompanying a patient to their appointment will only be allowed in the ultrasound room at the discretion of the ultrasound technician under special circumstances. We apologize for any  inconvenience.   Follow-Up: At [facility], you and your health needs are our priority.  As part of our continuing mission to provide you with exceptional heart care, our providers are all part of one team.  This team includes your primary Cardiologist (physician) and Advanced Practice Providers or APPs (Physician Assistants and Nurse Practitioners) who all work together to provide you with the care you need, when you need it.  Your next appointment:    Follow-up within 3 months with Dr. Micael Adas Follow-up in 6 months with Dayna Dunn, PA-C  We recommend signing up for the patient portal called MyChart.  Sign up information is provided on this After Visit Summary.  MyChart is used to connect with patients for Virtual Visits (Telemedicine).  Patients are able to view lab/test results, encounter notes, upcoming appointments, etc.  Non-urgent messages can be sent to your provider as well.   To learn more about what you can do with MyChart, go to [email or web address].   Other Instructions  Please keep a log of your blood pressure and heart rate during the episodes of heart thumping and relay them to us  by MyChart when you have several days of readings.

## 2023-12-01 ENCOUNTER — Other Ambulatory Visit: Payer: Self-pay

## 2023-12-01 DIAGNOSIS — I2510 Atherosclerotic heart disease of native coronary artery without angina pectoris: Secondary | ICD-10-CM

## 2023-12-01 DIAGNOSIS — R002 Palpitations: Secondary | ICD-10-CM

## 2023-12-13 DIAGNOSIS — M7989 Other specified soft tissue disorders: Secondary | ICD-10-CM | POA: Diagnosis not present

## 2023-12-13 DIAGNOSIS — R002 Palpitations: Secondary | ICD-10-CM | POA: Diagnosis not present

## 2023-12-13 DIAGNOSIS — I2510 Atherosclerotic heart disease of native coronary artery without angina pectoris: Secondary | ICD-10-CM | POA: Diagnosis not present

## 2023-12-13 LAB — CBC

## 2023-12-14 ENCOUNTER — Ambulatory Visit: Payer: Self-pay | Admitting: Physician Assistant

## 2023-12-14 DIAGNOSIS — R600 Localized edema: Secondary | ICD-10-CM

## 2023-12-14 DIAGNOSIS — Z79899 Other long term (current) drug therapy: Secondary | ICD-10-CM

## 2023-12-15 ENCOUNTER — Other Ambulatory Visit (HOSPITAL_BASED_OUTPATIENT_CLINIC_OR_DEPARTMENT_OTHER): Payer: Self-pay

## 2023-12-15 ENCOUNTER — Ambulatory Visit (INDEPENDENT_AMBULATORY_CARE_PROVIDER_SITE_OTHER)

## 2023-12-15 ENCOUNTER — Ambulatory Visit: Payer: Self-pay | Admitting: Physician Assistant

## 2023-12-15 DIAGNOSIS — I2510 Atherosclerotic heart disease of native coronary artery without angina pectoris: Secondary | ICD-10-CM

## 2023-12-15 DIAGNOSIS — E785 Hyperlipidemia, unspecified: Secondary | ICD-10-CM

## 2023-12-15 DIAGNOSIS — R600 Localized edema: Secondary | ICD-10-CM | POA: Diagnosis not present

## 2023-12-15 DIAGNOSIS — Z79899 Other long term (current) drug therapy: Secondary | ICD-10-CM

## 2023-12-15 DIAGNOSIS — E7800 Pure hypercholesterolemia, unspecified: Secondary | ICD-10-CM

## 2023-12-15 LAB — CBC
Hematocrit: 44.4 % (ref 34.0–46.6)
Hemoglobin: 14.4 g/dL (ref 11.1–15.9)
MCH: 28.8 pg (ref 26.6–33.0)
MCHC: 32.4 g/dL (ref 31.5–35.7)
MCV: 89 fL (ref 79–97)
Platelets: 297 10*3/uL (ref 150–450)
RBC: 5 x10E6/uL (ref 3.77–5.28)
RDW: 13.3 % (ref 11.7–15.4)
WBC: 7.9 10*3/uL (ref 3.4–10.8)

## 2023-12-15 LAB — BRAIN NATRIURETIC PEPTIDE: BNP: 12.2 pg/mL (ref 0.0–100.0)

## 2023-12-15 LAB — COMPREHENSIVE METABOLIC PANEL WITH GFR
ALT: 22 IU/L (ref 0–32)
AST: 21 IU/L (ref 0–40)
Albumin: 4.5 g/dL (ref 3.9–4.9)
Alkaline Phosphatase: 126 IU/L — ABNORMAL HIGH (ref 44–121)
BUN/Creatinine Ratio: 17 (ref 12–28)
BUN: 13 mg/dL (ref 8–27)
Bilirubin Total: 0.3 mg/dL (ref 0.0–1.2)
CO2: 22 mmol/L (ref 20–29)
Calcium: 9.5 mg/dL (ref 8.7–10.3)
Chloride: 102 mmol/L (ref 96–106)
Creatinine, Ser: 0.77 mg/dL (ref 0.57–1.00)
Globulin, Total: 2.4 g/dL (ref 1.5–4.5)
Glucose: 150 mg/dL — ABNORMAL HIGH (ref 70–99)
Potassium: 4.4 mmol/L (ref 3.5–5.2)
Sodium: 141 mmol/L (ref 134–144)
Total Protein: 6.9 g/dL (ref 6.0–8.5)
eGFR: 86 mL/min/{1.73_m2} (ref >59)

## 2023-12-15 LAB — LIPOPROTEIN A (LPA): Lipoprotein (a): 228.1 nmol/L — AB (ref <75.0)

## 2023-12-15 LAB — TSH: TSH: 2.24 u[IU]/mL (ref 0.450–4.500)

## 2023-12-15 LAB — MAGNESIUM: Magnesium: 2 mg/dL (ref 1.6–2.3)

## 2023-12-15 MED ORDER — FUROSEMIDE 20 MG PO TABS
20.0000 mg | ORAL_TABLET | Freq: Every day | ORAL | 2 refills | Status: DC
  Filled 2023-12-15: qty 30, 30d supply, fill #0

## 2023-12-15 NOTE — Telephone Encounter (Signed)
 Can we get her in for a stat bilateral lower extremity venous duplex today to exclude DVT? If unable to arrange, please refer to ED.  If positive, please review with DOD for rx as I will be out of the office this afternoon. She is a candidate for anticoagulation from my standpoint if she has one.  Otherwise, please start Lasix 20mg  daily with repeat BMET 1 week.  Please arrange closer f/u with me in the next few weeks to reassess.

## 2023-12-15 NOTE — Addendum Note (Signed)
 Addended by: Erik Nessel N on: 12/15/2023 10:26 AM   Modules accepted: Orders, Level of Service

## 2023-12-15 NOTE — Telephone Encounter (Signed)
 Called and spoke with patient. She is going to have LE venous duplex performed at [facility] office today. No further needs voiced at this time.

## 2023-12-15 NOTE — Telephone Encounter (Signed)
 Spoke with patient and shared recommendation from Dayna to have stat LE venous duplex performed today. I have reached out to our vascular ultrasound team and awaiting to hear back about availability. Patient states she will be able to come in today if there are any openings.  Also advised patient she will need to go to ED for evaluation if no openings here today. Patient reluctant to go to ED and states she will call around to see if she can have doppler performed somewhere else while we wait to hear back on any openings at our office.

## 2023-12-18 DIAGNOSIS — Z79899 Other long term (current) drug therapy: Secondary | ICD-10-CM

## 2023-12-18 DIAGNOSIS — R600 Localized edema: Secondary | ICD-10-CM

## 2023-12-18 DIAGNOSIS — E785 Hyperlipidemia, unspecified: Secondary | ICD-10-CM

## 2023-12-19 NOTE — Telephone Encounter (Signed)
 Please thank her for reaching out- I still never got the lipid panel back to my inbox. It does not look like [facility] drew when she came in for all her other bloodwork. OK to draw when she returns for her f/u BMET that we ordered after starting Lasix. Should be fasting when drawn.

## 2023-12-27 NOTE — Telephone Encounter (Signed)
 Would just obtain as soon as she's back in town. Please make sure the lab orders are correctly in for BMET and fasting lipid panel since [facility] did not draw the lipid the first time around. Thanks!

## 2024-01-01 ENCOUNTER — Encounter (HOSPITAL_COMMUNITY): Payer: Self-pay | Admitting: Urology

## 2024-01-01 ENCOUNTER — Inpatient Hospital Stay (HOSPITAL_COMMUNITY)

## 2024-01-01 ENCOUNTER — Ambulatory Visit (HOSPITAL_COMMUNITY)
Admission: EM | Admit: 2024-01-01 | Discharge: 2024-01-01 | Disposition: A | Discharge status: Home / Self Care | Source: Ambulatory Visit | Attending: Urology | Admitting: Urology

## 2024-01-01 ENCOUNTER — Inpatient Hospital Stay (HOSPITAL_COMMUNITY): Admitting: Anesthesiology

## 2024-01-01 ENCOUNTER — Encounter (HOSPITAL_COMMUNITY)
Admission: EM | Disposition: A | Discharge status: Home / Self Care | Payer: Self-pay | Source: Ambulatory Visit | Attending: Urology

## 2024-01-01 ENCOUNTER — Other Ambulatory Visit: Payer: Self-pay | Admitting: Urology

## 2024-01-01 DIAGNOSIS — N201 Calculus of ureter: Secondary | ICD-10-CM | POA: Insufficient documentation

## 2024-01-01 DIAGNOSIS — I10 Essential (primary) hypertension: Secondary | ICD-10-CM | POA: Diagnosis not present

## 2024-01-01 DIAGNOSIS — E119 Type 2 diabetes mellitus without complications: Secondary | ICD-10-CM | POA: Insufficient documentation

## 2024-01-01 DIAGNOSIS — I5032 Chronic diastolic (congestive) heart failure: Secondary | ICD-10-CM | POA: Diagnosis not present

## 2024-01-01 DIAGNOSIS — Z7985 Long-term (current) use of injectable non-insulin antidiabetic drugs: Secondary | ICD-10-CM | POA: Insufficient documentation

## 2024-01-01 DIAGNOSIS — Z6841 Body Mass Index (BMI) 40.0 and over, adult: Secondary | ICD-10-CM | POA: Insufficient documentation

## 2024-01-01 DIAGNOSIS — J45909 Unspecified asthma, uncomplicated: Secondary | ICD-10-CM | POA: Insufficient documentation

## 2024-01-01 DIAGNOSIS — Z87442 Personal history of urinary calculi: Secondary | ICD-10-CM | POA: Insufficient documentation

## 2024-01-01 DIAGNOSIS — N134 Hydroureter: Secondary | ICD-10-CM | POA: Diagnosis not present

## 2024-01-01 DIAGNOSIS — E66813 Obesity, class 3: Secondary | ICD-10-CM | POA: Diagnosis not present

## 2024-01-01 DIAGNOSIS — Z7984 Long term (current) use of oral hypoglycemic drugs: Secondary | ICD-10-CM | POA: Diagnosis not present

## 2024-01-01 DIAGNOSIS — I2510 Atherosclerotic heart disease of native coronary artery without angina pectoris: Secondary | ICD-10-CM | POA: Diagnosis not present

## 2024-01-01 DIAGNOSIS — Z79899 Other long term (current) drug therapy: Secondary | ICD-10-CM | POA: Insufficient documentation

## 2024-01-01 DIAGNOSIS — G4733 Obstructive sleep apnea (adult) (pediatric): Secondary | ICD-10-CM | POA: Insufficient documentation

## 2024-01-01 DIAGNOSIS — R160 Hepatomegaly, not elsewhere classified: Secondary | ICD-10-CM | POA: Diagnosis not present

## 2024-01-01 DIAGNOSIS — I110 Hypertensive heart disease with heart failure: Secondary | ICD-10-CM | POA: Diagnosis not present

## 2024-01-01 DIAGNOSIS — N132 Hydronephrosis with renal and ureteral calculous obstruction: Secondary | ICD-10-CM | POA: Diagnosis not present

## 2024-01-01 DIAGNOSIS — Z833 Family history of diabetes mellitus: Secondary | ICD-10-CM | POA: Insufficient documentation

## 2024-01-01 DIAGNOSIS — N200 Calculus of kidney: Secondary | ICD-10-CM | POA: Diagnosis not present

## 2024-01-01 HISTORY — PX: CYSTOSCOPY/RETROGRADE/URETEROSCOPY: SHX5316

## 2024-01-01 LAB — GLUCOSE, CAPILLARY
Glucose-Capillary: 140 mg/dL — ABNORMAL HIGH (ref 70–99)
Glucose-Capillary: 102 mg/dL — ABNORMAL HIGH (ref 70–99)

## 2024-01-01 SURGERY — CYSTOSCOPY/RETROGRADE/URETEROSCOPY
Anesthesia: General | Laterality: Left

## 2024-01-01 MED ORDER — SODIUM CHLORIDE 0.9 % IV SOLN
2.0000 g | INTRAVENOUS | Status: AC
  Administered 2024-01-01: 20 g via INTRAVENOUS

## 2024-01-01 MED ORDER — STERILE WATER FOR IRRIGATION IR SOLN
Status: DC | PRN
  Administered 2024-01-01: 3000 mL

## 2024-01-01 MED ORDER — PHENAZOPYRIDINE HCL 200 MG PO TABS: 200.0000 mg | ORAL_TABLET | Freq: Three times a day (TID) | ORAL | 0 refills | Status: DC | PRN

## 2024-01-01 MED ORDER — MIDAZOLAM HCL 2 MG/2ML IJ SOLN
INTRAMUSCULAR | Status: AC
  Filled 2024-01-01: qty 2

## 2024-01-01 MED ORDER — DROPERIDOL 2.5 MG/ML IJ SOLN: 0.6250 mg | Freq: Once | INTRAMUSCULAR | Status: DC | PRN

## 2024-01-01 MED ORDER — CEFDINIR 300 MG PO CAPS: 300.0000 mg | ORAL_CAPSULE | Freq: Two times a day (BID) | ORAL | 0 refills | Status: AC

## 2024-01-01 MED ORDER — DEXAMETHASONE SODIUM PHOSPHATE 4 MG/ML IJ SOLN
INTRAMUSCULAR | Status: DC | PRN
  Administered 2024-01-01: 5 mg via INTRAVENOUS

## 2024-01-01 MED ORDER — ONDANSETRON HCL 4 MG/2ML IJ SOLN
INTRAMUSCULAR | Status: DC | PRN
Start: 2024-01-01 — End: 2024-01-01
  Administered 2024-01-01: 4 mg via INTRAVENOUS

## 2024-01-01 MED ORDER — FENTANYL CITRATE (PF) 100 MCG/2ML IJ SOLN
INTRAMUSCULAR | Status: AC
  Filled 2024-01-01: qty 2

## 2024-01-01 MED ORDER — IOHEXOL 300 MG/ML  SOLN
INTRAMUSCULAR | Status: DC | PRN
Start: 2024-01-01 — End: 2024-01-01
  Administered 2024-01-01: 5 mL

## 2024-01-01 MED ORDER — ACETAMINOPHEN 500 MG PO TABS
1000.0000 mg | ORAL_TABLET | Freq: Once | ORAL | Status: AC
  Administered 2024-01-01: 1000 mg via ORAL
  Filled 2024-01-01: qty 2

## 2024-01-01 MED ORDER — OXYCODONE HCL 5 MG/5ML PO SOLN: 5.0000 mg | Freq: Once | ORAL | Status: DC | PRN

## 2024-01-01 MED ORDER — METHOCARBAMOL 750 MG PO TABS: 750.0000 mg | ORAL_TABLET | Freq: Four times a day (QID) | ORAL | 0 refills | Status: AC

## 2024-01-01 MED ORDER — HYOSCYAMINE SULFATE 0.125 MG PO TBDP: 0.1250 mg | ORAL_TABLET | Freq: Four times a day (QID) | ORAL | 0 refills | Status: DC | PRN

## 2024-01-01 MED ORDER — MIDAZOLAM HCL 5 MG/5ML IJ SOLN
INTRAMUSCULAR | Status: DC | PRN
  Administered 2024-01-01: 2 mg via INTRAVENOUS

## 2024-01-01 MED ORDER — INSULIN ASPART 100 UNIT/ML IJ SOLN
0.0000 [IU] | INTRAMUSCULAR | Status: DC | PRN
  Administered 2024-01-01: 2 [IU] via SUBCUTANEOUS
  Filled 2024-01-01: qty 1

## 2024-01-01 MED ORDER — SODIUM CHLORIDE 0.9 % IV SOLN
INTRAVENOUS | Status: AC
  Filled 2024-01-01: qty 20

## 2024-01-01 MED ORDER — LIDOCAINE 2% (20 MG/ML) 5 ML SYRINGE
INTRAMUSCULAR | Status: DC | PRN
  Administered 2024-01-01: 100 mg via INTRAVENOUS

## 2024-01-01 MED ORDER — FENTANYL CITRATE (PF) 100 MCG/2ML IJ SOLN
INTRAMUSCULAR | Status: DC | PRN
  Administered 2024-01-01 (×2): 50 ug via INTRAVENOUS

## 2024-01-01 MED ORDER — PROPOFOL 10 MG/ML IV BOLUS
INTRAVENOUS | Status: DC | PRN
  Administered 2024-01-01: 200 mg via INTRAVENOUS

## 2024-01-01 MED ORDER — KETOROLAC TROMETHAMINE 30 MG/ML IJ SOLN
INTRAMUSCULAR | Status: DC | PRN
  Administered 2024-01-01: 30 mg via INTRAVENOUS

## 2024-01-01 MED ORDER — FENTANYL CITRATE PF 50 MCG/ML IJ SOSY: 25.0000 ug | PREFILLED_SYRINGE | INTRAMUSCULAR | Status: DC | PRN

## 2024-01-01 MED ORDER — TAMSULOSIN HCL 0.4 MG PO CAPS: 0.4000 mg | ORAL_CAPSULE | Freq: Every day | ORAL | 0 refills | Status: DC

## 2024-01-01 MED ORDER — LACTATED RINGERS IV SOLN: INTRAVENOUS | Status: DC | PRN

## 2024-01-01 MED ORDER — OXYCODONE HCL 5 MG PO TABS: 5.0000 mg | ORAL_TABLET | Freq: Once | ORAL | Status: DC | PRN

## 2024-01-01 SURGICAL SUPPLY — 19 items
BAG URO CATCHER STRL LF (MISCELLANEOUS) ×1 IMPLANT
BASKET ZERO TIP NITINOL 2.4FR (BASKET) IMPLANT
CATH URETL OPEN 5X70 (CATHETERS) IMPLANT
CLOTH BEACON ORANGE TIMEOUT ST (SAFETY) ×1 IMPLANT
EXTRACTOR STONE 1.7FRX115CM (UROLOGICAL SUPPLIES) IMPLANT
FIBER LASER MOSES 200 DFL (Laser) IMPLANT
FIBER LASER MOSES 365 DFL (Laser) IMPLANT
GOWN STRL REUS W/ TWL XL LVL3 (GOWN DISPOSABLE) ×1 IMPLANT
GUIDEWIRE ANG ZIPWIRE 038X150 (WIRE) IMPLANT
GUIDEWIRE STR DUAL SENSOR (WIRE) ×1 IMPLANT
KIT TURNOVER KIT A (KITS) ×1 IMPLANT
MANIFOLD NEPTUNE II (INSTRUMENTS) ×1 IMPLANT
MAT HALF PREVALON HALF STRYKER (MISCELLANEOUS) IMPLANT
PACK CYSTO (CUSTOM PROCEDURE TRAY) ×1 IMPLANT
SHEATH NAVIGATOR HD 11/13X28 (SHEATH) IMPLANT
SHEATH NAVIGATOR HD 11/13X36 (SHEATH) ×1 IMPLANT
STENT URET 6FRX24 CONTOUR (STENTS) IMPLANT
TUBING CONNECTING 10 (TUBING) ×1 IMPLANT
TUBING UROLOGY SET (TUBING) ×1 IMPLANT

## 2024-01-01 NOTE — Op Note (Signed)
 Preoperative diagnosis: left obstructing ureteral stone Postoperative diagnosis: Same  Procedures performed: Cystoscopy, left retrograde pyelogram with interpretation, left ureteral stent placement  Surgeon: Dr. Steffan Pea  Findings: Left proximal ureteral stone. 6 x 24 double-J stent placed without strings. Evidence of cystitis.  Left retrograde pyelogram interpretation: No significant hydronephrosis.  Stent of the proximal ureter.  No contrast extravasation.  Specimens: None  Implants: 6 x 24 double-J stent without strings.  Indication: Jaime Nichols is a 63 y.o. patient with history of nephrolithiasis here today with left-sided flank pain.  Urinalysis was positive for nitrates and she was to episodes of pyelonephritis.  He was close patient wants stent today. After reviewing the management options for treatment, he elected to proceed with the above surgical procedure(s). We have discussed the potential benefits and risks of the procedure, side effects of the proposed treatment, the likelihood of the patient achieving the goals of the procedure, and any potential problems that might occur during the procedure or recuperation. Informed consent has been obtained.  Procedure in detail: Patient was consented prior to being brought back to the operating room. He was then brought back to the operating room placed on the table in supine position. General anesthesia was then induced and endotracheal tube inserted. This then placed in dorsolithotomy position and prepped and draped in the routine sterile fashion. A timeout was held.  Using a 22.5 [place] cystoscope with a 30  lens, I gently passed the scope into the patient's urethra and into the bladder under visual guidance. A 360 cystoscopic evaluation was performed with no mucosal abnormalities, no tumors, and no foreign bodies identified. I then passed a 0. 038 Sensor wire into the left ureteral orifice and into the left renal pelvis.  I then slid a 5 [place] open-ended ureteral catheter over the wire and into the renal pelvis and removed the wire.SABRA Next I injected 10 cc of contrast into the renal collecting system and performed a retrograde pyelogram. I then replaced the wire through the open-ended ureteral catheter and remove the catheter. I then slid a 24 cm x 6 French double-J ureteral stent over the wire and into the renal pelvis under fluoroscopic guidance. Once a nice curl was noted in the renal pelvis I advanced the distal end of the stent into the bladder before removing the wire completely. A final fluoroscopic image was obtained confirming the curl in the renal pelvis as well as a curl in the bladder.   Disposition: The patient returned to the PACU in stable condition.

## 2024-01-01 NOTE — Anesthesia Preprocedure Evaluation (Addendum)
 Anesthesia Evaluation  Patient identified by MRN, date of birth, ID band Patient awake    Reviewed: Allergy & Precautions, NPO status , Patient's Chart, lab work & pertinent test results, reviewed documented beta blocker date and time   Airway Mallampati: III  TM Distance: >3 FB Neck ROM: Full    Dental  (+) Teeth Intact, Dental Advisory Given   Pulmonary asthma (well controlled) , sleep apnea and Continuous Positive Airway Pressure Ventilation    Pulmonary exam normal breath sounds clear to auscultation       Cardiovascular hypertension (148/70 preop), Pt. on medications and Pt. on home beta blockers Normal cardiovascular exam Rhythm:Regular Rate:Normal  Echo 2023  1. Left ventricular ejection fraction, by estimation, is 60 to 65%. The  left ventricle has normal function. The left ventricle has no regional  wall motion abnormalities. Left ventricular diastolic parameters were  normal.   2. Right ventricular systolic function is normal. The right ventricular  size is normal.   3. Left atrial size was mildly dilated.   4. The mitral valve is normal in structure. No evidence of mitral valve  regurgitation. No evidence of mitral stenosis.   5. The aortic valve is normal in structure. Aortic valve regurgitation is  not visualized. No aortic stenosis is present.   6. The inferior vena cava is normal in size with greater than 50%  respiratory variability, suggesting right atrial pressure of 3 mmHg.   Stress test 2021 normal    Neuro/Psych negative neurological ROS  negative psych ROS   GI/Hepatic negative GI ROS, Neg liver ROS,,,  Endo/Other  diabetes, Well Controlled, Type 2, Oral Hypoglycemic Agents  Class 3 obesityFS 140 in preop, got 2 units of insulin   Renal/GU negative Renal ROS  negative genitourinary   Musculoskeletal negative musculoskeletal ROS (+)    Abdominal  (+) + obese  Peds  Hematology negative  hematology ROS (+)   Anesthesia Other Findings Mounjaro LD: 7d  Reproductive/Obstetrics negative OB ROS                              Anesthesia Physical Anesthesia Plan  ASA: 3  Anesthesia Plan: General   Post-op Pain Management: Tylenol  PO (pre-op)*   Induction: Intravenous  PONV Risk Score and Plan: 3 and Ondansetron , Dexamethasone , Midazolam  and Treatment may vary due to age or medical condition  Airway Management Planned: LMA  Additional Equipment: None  Intra-op Plan:   Post-operative Plan: Extubation in OR  Informed Consent: I have reviewed the patients History and Physical, chart, labs and discussed the procedure including the risks, benefits and alternatives for the proposed anesthesia with the patient or authorized representative who has indicated his/her understanding and acceptance.     Dental advisory given  Plan Discussed with: CRNA  Anesthesia Plan Comments:          Anesthesia Quick Evaluation

## 2024-01-01 NOTE — Discharge Instructions (Addendum)
 DISCHARGE INSTRUCTIONS FOR Ureteroscopy and/or Ureteral Stent Placement  MEDICATIONS:  1.  Robaxin  2. Tamsulosin   3. Hyoscyamine   4. Methocarbamol  5. Cefdinir    ACTIVITY:  1. No strenuous activity x 1week  2. No driving while on narcotic pain medications  3. Drink plenty of water   4. Continue to walk at home - it is normal to see blood in the urine while the stent is in place, so keep active, but don't over do it.  5. May return to work/school tomorrow or when you feel ready  6. You may experience some pain when urinating in the kidney on the side that was operated on while the stent is in place this is normal  WHAT IS NORMAL TO EXPERIENCE: It is normal to feel the urge to urinate while the stent is in place It is normal to have blood in your urine while the stent is in place  It sometimes can be normal to have pain in your kidney when you urinate   BATHING:  1. You can shower and we recommend daily showers    DIET: You may return to your normal diet immediately. Because of the raw surface of your bladder, alcohol, spicy foods, acid type foods and drinks with caffeine may cause irritation or frequency and should be used in moderation. To keep your urine flowing freely and to avoid constipation, drink plenty of fluids during the day ( 8-10 glasses ). Tip: Avoid cranberry juice because it is very acidic.  SIGNS/SYMPTOMS TO CALL:  Please call us  if you have a fever greater than 101.5, uncontrolled nausea/vomiting, uncontrolled pain, dizziness, unable to urinate, bloody urine with clots greater than the size of a quarter, chest pain, shortness of breath, leg swelling, leg pain, redness around wound, drainage from wound, or any other concerns or questions.   You can reach us  at [phone].   FOLLOW-UP:  1. You will be called to set up a ureteroscopy to remove your stone in the near future

## 2024-01-01 NOTE — H&P (Signed)
 H&P  Chief Complaint: Left ureteral stone  History of Present Illness: 63 year old female with history of nephrolithiasis presented to clinic today with left-sided flank pain.  She has 2 episodes of obstructive pyelonephritis.  Urinalysis was positive for nitrites indicating likely urinary tract infection, she has not been complaining of bladder Azo.  CT in clinic demonstrated proximal left obstructing stone.  Due to multiple history obstructive pyelopatient with prostatic stent plan for stent today.  Past Medical History:  Diagnosis Date   Aortic atherosclerosis (HCC)    Asthma    CAD in native artery    very high calcium  score at 875.  coronary CTA  showed 30% RCA and LAD.   Chronic diastolic CHF (congestive heart failure) (HCC) 2012   Diabetes mellitus type 2 in obese    Dyspnea    with exertion   Fatty liver    Fibroid    History of kidney stones 09/2019   HTN (hypertension)    Hyperlipidemia    Morbid obesity (HCC)    OSA on CPAP    PAT (paroxysmal atrial tachycardia) (HCC)    Premature atrial contractions    PVC's (premature ventricular contractions)    Past Surgical History:  Procedure Laterality Date   ABDOMINAL HYSTERECTOMY  06/20/2008   BSO   Benign uterine polyps  01/18/2009   CYSTOSCOPY W/ URETERAL STENT PLACEMENT Right 07/02/2021   Procedure: CYSTOSCOPY WITH RETROGRADE PYELOGRAM/URETERAL STENT PLACEMENT;  Surgeon: Rosalind Zachary NOVAK, MD;  Location: [facility];  Service: Urology;  Laterality: Right;   CYSTOSCOPY/URETEROSCOPY/HOLMIUM LASER/STENT PLACEMENT Right 07/28/2021   Procedure: RIGHT URETEROSCOPY/ RETROGRADE / HOLMIUM LASER/ STONE BASKETRY, STENT EXCHANGE;  Surgeon: Cam Morene ORN, MD;  Location: [facility];  Service: Urology;  Laterality: Right;   DILATION AND CURETTAGE OF UTERUS  2010   EXTRACORPOREAL SHOCK WAVE LITHOTRIPSY Left 06/05/2023   Procedure: LEFT EXTRACORPOREAL SHOCK WAVE LITHOTRIPSY (ESWL);  Surgeon: Alvaro Ricardo NOVAK Mickey., MD;   Location: [facility];  Service: Urology;  Laterality: Left;   HYSTEROSCOPY  2010   URETEROSCOPY WITH HOLMIUM LASER LITHOTRIPSY Left 2021    Home Medications:  Medications Prior to Admission  Medication Sig Dispense Refill Last Dose/Taking   allopurinol  (ZYLOPRIM ) 300 MG tablet Take 300 mg by mouth daily.   12/31/2023 Morning   D 1000 25 MCG (1000 UT) capsule Take 1,000 Units by mouth 3 (three) times a week.   Past Week   diltiazem  (CARDIZEM  CD) 120 MG 24 hr capsule TAKE 1 CAPSULE BY MOUTH EVERY DAY 90 capsule 1 12/31/2023 Morning   erythromycin ophthalmic ointment Place 1 Application into both eyes as needed.   Past Week   estradiol  (ESTRACE ) 0.1 MG/GM vaginal cream Place 1 Applicatorful vaginally every 14 (fourteen) days.   Past Week   Evolocumab  (REPATHA  SURECLICK) 140 MG/ML SOAJ INJECT 1 PEN INTO THE SKIN EVERY 14 (FOURTEEN) DAYS. 2 mL 11 Past Month   furosemide  (LASIX ) 20 MG tablet Take 1 tablet (20 mg total) by mouth daily. 30 tablet 2 Past Week   lisinopril  (ZESTRIL ) 10 MG tablet Take 1 tablet (10 mg total) by mouth daily. 90 tablet 3 12/31/2023 Morning   magnesium gluconate (MAGONATE) 500 MG tablet Take 500 mg by mouth 2 (two) times daily.   Past Week   metFORMIN  (GLUCOPHAGE -XR) 500 MG 24 hr tablet Take 500 mg by mouth daily with breakfast.   12/31/2023 Morning   metoprolol  succinate (TOPROL -XL) 25 MG 24 hr tablet TAKE 1 TABLET (25 MG TOTAL) BY MOUTH  DAILY. 90 tablet 3 12/31/2023 Morning   Potassium Citrate 15 MEQ (1620 MG) TBCR Take 15 mEq by mouth 3 (three) times a week.   Past Week   colchicine 0.6 MG tablet as needed.   Unknown   fluconazole  (DIFLUCAN ) 150 MG tablet Take 150 mg by mouth as needed.   Unknown   glimepiride  (AMARYL ) 4 MG tablet Take 2 mg by mouth every morning.   Unknown   levalbuterol  (XOPENEX  HFA) 45 MCG/ACT inhaler Inhale 1-2 puffs into the lungs every 4 (four) hours as needed for shortness of breath.   Unknown   PRESCRIPTION MEDICATION CPAP- At bedtime    Unknown   tamsulosin  (FLOMAX ) 0.4 MG CAPS capsule Take 1 capsule (0.4 mg total) by mouth daily. 15 capsule 0    terbinafine  (LAMISIL ) 250 MG tablet Take 1 tablet (250 mg total) by mouth daily. 60 tablet 0    terconazole  (TERAZOL 7 ) 0.4 % vaginal cream Place 1 applicator vaginally as needed.   More than a month   tirzepatide (MOUNJARO) 12.5 MG/0.5ML Pen Inject 2.5 mg into the skin once a week.   12/25/2023   tiZANidine  (ZANAFLEX ) 4 MG tablet Take 0.5 tablets (2 mg total) by mouth every 6 (six) hours as needed for muscle spasms. 30 tablet 0 Unknown   Allergies:  Allergies  Allergen Reactions   Erythromycin Diarrhea and Nausea And Vomiting    Other reaction(s): stomach upset   Latex Other (See Comments)    REACTION: CONTACT DERMATITIS   Nickel Other (See Comments)    Poor healing- dental reasons   Statins Other (See Comments)    Joint pain   Crestor  [Rosuvastatin ] Other (See Comments)    Cannot take when taking Colchicine   Dapagliflozin     Other reaction(s): worsening of urine incontinence   Hydrochlorothiazide     Previously discontinued due to uric acid kidney stones   Metformin  Hcl Diarrhea and Other (See Comments)    Diarrhea at higher doses, GI Upset   Potassium Citrate Other (See Comments)    Affected eyes- redness and crusty at 30 mEq/daily; pt states she has no reaction to the potassium citrate that she is taking now and that this only happened when she was taking too much   Zocor [Simvastatin] Other (See Comments)    Joint pain    Family History  Problem Relation Age of Onset   Hypertension Mother    Heart disease Father        Died age 29s of a cardiac event - was told it was atherosclerosis but also had PVCs   Healthy Sister    Arrhythmia Sister        PVCs   Depression Brother    Healthy Sister    Diabetes Paternal Grandmother    Arrhythmia Other        Distant relative with WPW.   Social History:  reports that she has never smoked. She has never used  smokeless tobacco. She reports that she does not currently use alcohol. She reports that she does not use drugs.  ROS: A complete review of systems was performed.  All systems are negative except for pertinent findings as noted. ROS   Physical Exam:  Vital signs in last 24 hours: Temp:  [98.5 F (36.9 C)] 98.5 F (36.9 C) (07/14 1328) Pulse Rate:  [89] 89 (07/14 1328) Resp:  [17] 17 (07/14 1328) BP: (148)/(70) 148/70 (07/14 1328) SpO2:  [96 %] 96 % (07/14 1328) Weight:  [111.1 kg] 111.1  kg (07/14 1343) General:  Alert and oriented, No acute distress HEENT: Normocephalic, atraumatic Neck: No JVD or lymphadenopathy Cardiovascular: Regular rate and rhythm Lungs: Regular rate and effort Abdomen: Soft, nontender, nondistended, no abdominal masses Back: No CVA tenderness Extremities: No edema Neurologic: Grossly intact  Laboratory Data:  Results for orders placed or performed during the hospital encounter of 01/01/24 (from the past 24 hours)  Glucose, capillary     Status: Abnormal   Collection Time: 01/01/24  1:37 PM  Result Value Ref Range   Glucose-Capillary 140 (H) 70 - 99 mg/dL   Comment 1 Notify RN    Comment 2 Document in Chart    No results found for this or any previous visit (from the past 240 hours). Creatinine: No results for input(s): CREATININE in the last 168 hours.  Impression/Assessment:  63 year old female with history of nephrolithiasis presented to clinic today with left-sided flank pain.  CT in clinic demonstrated left obstructing stone.  She has 2 episodes of obstructive pyelonephritis.  Urinalysis was positive for nitrites indicating likely urinary tract infection, she has not been complaining of bladder Azo.  Due to multiple history obstructive pyelopatient with prostatic stent plan for stent today.  We discussed risk benefits alternatives to ureteral stent.  This included bleeding infection and damage to surrounding structures surrounding structures  including ureter as well as urethra.  We discussed the need for stent postoperatively as well as the potential symptoms of stent placement.  We discussed possible inability to complete procedure due to caliber of your ureter or inability to pass stone possibly requiring long-term stent versus nephrostomy tube.  We discussed need for possible second surgery.  Patient voiced their understanding and consent was obtained.   Plan:  Proceed with left ureteral stent.  Steffan JAYSON Pea 01/01/2024, 3:43 PM

## 2024-01-01 NOTE — Anesthesia Postprocedure Evaluation (Signed)
 Anesthesia Post Note  Patient: Jaime Nichols  Procedure(s) Performed: CYSTOSCOPY/RETROGRADE/STENT PLACEMENT (Left)     Patient location during evaluation: PACU Anesthesia Type: General Level of consciousness: sedated and patient cooperative Pain management: pain level controlled Vital Signs Assessment: post-procedure vital signs reviewed and stable Respiratory status: spontaneous breathing Cardiovascular status: stable Anesthetic complications: no   No notable events documented.  Last Vitals:  Vitals:   01/01/24 1700 01/01/24 1715  BP: 112/62 113/64  Pulse: 76 72  Resp: 18 16  Temp:  36.8 C  SpO2: 96% 93%    Last Pain:  Vitals:   01/01/24 1745  TempSrc:   PainSc: 1                  Norleen Pope

## 2024-01-01 NOTE — Anesthesia Procedure Notes (Signed)
 Procedure Name: LMA Insertion Date/Time: 01/01/2024 3:57 PM  Performed by: Judythe Tanda Aran, CRNAPre-anesthesia Checklist: Emergency Drugs available, Patient identified, Suction available and Patient being monitored Patient Re-evaluated:Patient Re-evaluated prior to induction Oxygen Delivery Method: Circle system utilized Preoxygenation: Pre-oxygenation with 100% oxygen Induction Type: IV induction Ventilation: Mask ventilation without difficulty LMA: LMA inserted LMA Size: 4.0 Number of attempts: 1 Placement Confirmation: positive ETCO2 and breath sounds checked- equal and bilateral Tube secured with: Tape Dental Injury: Teeth and Oropharynx as per pre-operative assessment

## 2024-01-01 NOTE — Transfer of Care (Signed)
 Immediate Anesthesia Transfer of Care Note  Patient: Jaime Nichols  Procedure(s) Performed: CYSTOSCOPY/RETROGRADE/STENT PLACEMENT (Left)  Patient Location: PACU  Anesthesia Type:General  Level of Consciousness: awake and patient cooperative  Airway & Oxygen Therapy: Patient Spontanous Breathing and Patient connected to nasal cannula oxygen  Post-op Assessment: Report given to RN and Post -op Vital signs reviewed and stable  Post vital signs: Reviewed and stable  Last Vitals:  Vitals Value Taken Time  BP 115/67 01/01/24 16:26  Temp    Pulse 90 01/01/24 16:28  Resp 12 01/01/24 16:28  SpO2 99 % 01/01/24 16:28  Vitals shown include unfiled device data.  Last Pain:  Vitals:   01/01/24 1343  TempSrc:   PainSc: 0-No pain         Complications: No notable events documented.

## 2024-01-02 ENCOUNTER — Encounter (HOSPITAL_COMMUNITY): Payer: Self-pay | Admitting: Urology

## 2024-01-03 ENCOUNTER — Other Ambulatory Visit: Payer: Self-pay | Admitting: Urology

## 2024-01-03 DIAGNOSIS — E785 Hyperlipidemia, unspecified: Secondary | ICD-10-CM | POA: Diagnosis not present

## 2024-01-03 DIAGNOSIS — R600 Localized edema: Secondary | ICD-10-CM | POA: Diagnosis not present

## 2024-01-03 DIAGNOSIS — Z79899 Other long term (current) drug therapy: Secondary | ICD-10-CM | POA: Diagnosis not present

## 2024-01-04 LAB — LIPID PANEL
Chol/HDL Ratio: 4.6 ratio — ABNORMAL HIGH (ref 0.0–4.4)
Cholesterol, Total: 174 mg/dL (ref 100–199)
HDL: 38 mg/dL — ABNORMAL LOW (ref >39)
LDL Chol Calc (NIH): 101 mg/dL — ABNORMAL HIGH (ref 0–99)
Triglycerides: 201 mg/dL — ABNORMAL HIGH (ref 0–149)
VLDL Cholesterol Cal: 35 mg/dL (ref 5–40)

## 2024-01-04 LAB — BASIC METABOLIC PANEL WITH GFR
BUN/Creatinine Ratio: 16 (ref 12–28)
BUN: 13 mg/dL (ref 8–27)
CO2: 18 mmol/L — ABNORMAL LOW (ref 20–29)
Calcium: 9 mg/dL (ref 8.7–10.3)
Chloride: 107 mmol/L — ABNORMAL HIGH (ref 96–106)
Creatinine, Ser: 0.83 mg/dL (ref 0.57–1.00)
Glucose: 165 mg/dL — ABNORMAL HIGH (ref 70–99)
Potassium: 4.1 mmol/L (ref 3.5–5.2)
Sodium: 145 mmol/L — ABNORMAL HIGH (ref 134–144)
eGFR: 79 mL/min/1.73 (ref >59)

## 2024-01-05 DIAGNOSIS — M79672 Pain in left foot: Secondary | ICD-10-CM | POA: Diagnosis not present

## 2024-01-08 NOTE — Telephone Encounter (Signed)
Agree with recs below.

## 2024-01-10 DIAGNOSIS — H5213 Myopia, bilateral: Secondary | ICD-10-CM | POA: Diagnosis not present

## 2024-01-10 DIAGNOSIS — B372 Candidiasis of skin and nail: Secondary | ICD-10-CM | POA: Diagnosis not present

## 2024-01-10 DIAGNOSIS — Z6841 Body Mass Index (BMI) 40.0 and over, adult: Secondary | ICD-10-CM | POA: Diagnosis not present

## 2024-01-10 DIAGNOSIS — E119 Type 2 diabetes mellitus without complications: Secondary | ICD-10-CM | POA: Diagnosis not present

## 2024-01-10 DIAGNOSIS — M79672 Pain in left foot: Secondary | ICD-10-CM | POA: Diagnosis not present

## 2024-01-10 DIAGNOSIS — H2513 Age-related nuclear cataract, bilateral: Secondary | ICD-10-CM | POA: Diagnosis not present

## 2024-01-10 DIAGNOSIS — N200 Calculus of kidney: Secondary | ICD-10-CM | POA: Diagnosis not present

## 2024-01-15 ENCOUNTER — Ambulatory Visit (HOSPITAL_BASED_OUTPATIENT_CLINIC_OR_DEPARTMENT_OTHER)
Admission: RE | Admit: 2024-01-15 | Discharge: 2024-01-15 | Disposition: A | Discharge status: Home / Self Care | Source: Ambulatory Visit | Attending: Physician Assistant | Admitting: Physician Assistant

## 2024-01-15 ENCOUNTER — Ambulatory Visit (HOSPITAL_COMMUNITY)
Admission: RE | Admit: 2024-01-15 | Discharge: 2024-01-15 | Disposition: A | Discharge status: Home / Self Care | Source: Ambulatory Visit | Attending: Cardiology | Admitting: Cardiology

## 2024-01-15 DIAGNOSIS — Z823 Family history of stroke: Secondary | ICD-10-CM | POA: Insufficient documentation

## 2024-01-15 DIAGNOSIS — I2510 Atherosclerotic heart disease of native coronary artery without angina pectoris: Secondary | ICD-10-CM | POA: Insufficient documentation

## 2024-01-15 DIAGNOSIS — I10 Essential (primary) hypertension: Secondary | ICD-10-CM | POA: Diagnosis not present

## 2024-01-15 DIAGNOSIS — R002 Palpitations: Secondary | ICD-10-CM | POA: Insufficient documentation

## 2024-01-15 LAB — ECHOCARDIOGRAM COMPLETE
Area-P 1/2: 4.6 cm2
S' Lateral: 3.2 cm

## 2024-01-19 DIAGNOSIS — N201 Calculus of ureter: Secondary | ICD-10-CM | POA: Diagnosis not present

## 2024-01-19 DIAGNOSIS — B961 Klebsiella pneumoniae [K. pneumoniae] as the cause of diseases classified elsewhere: Secondary | ICD-10-CM | POA: Diagnosis not present

## 2024-01-26 DIAGNOSIS — N390 Urinary tract infection, site not specified: Secondary | ICD-10-CM | POA: Diagnosis not present

## 2024-01-26 DIAGNOSIS — N201 Calculus of ureter: Secondary | ICD-10-CM | POA: Diagnosis not present

## 2024-01-26 DIAGNOSIS — N3000 Acute cystitis without hematuria: Secondary | ICD-10-CM | POA: Diagnosis not present

## 2024-01-26 DIAGNOSIS — B961 Klebsiella pneumoniae [K. pneumoniae] as the cause of diseases classified elsewhere: Secondary | ICD-10-CM | POA: Diagnosis not present

## 2024-01-26 NOTE — Patient Instructions (Signed)
 SURGICAL WAITING ROOM VISITATION  Patients having surgery or a procedure may have no more than 2 support people in the waiting area - these visitors may rotate.    Children under the age of 66 must have an adult with them who is not the patient.  Visitors with respiratory illnesses are discouraged from visiting and should remain at home.  If the patient needs to stay at the hospital during part of their recovery, the visitor guidelines for inpatient rooms apply. Pre-op nurse will coordinate an appropriate time for 1 support person to accompany patient in pre-op.  This support person may not rotate.    Please refer to the [facility] website for the visitor guidelines for Inpatients (after your surgery is over and you are in a regular room).       Your procedure is scheduled on: 02/08/2024    Report to [facility] Main Entrance    Report to admitting at  0515 AM   Call this number if you have problems the morning of surgery [phone]   Do not eat food or drink liquids  :After Midnight.                 If you have questions, please contact your surgeon's office.       Oral Hygiene is also important to reduce your risk of infection.                                    Remember - BRUSH YOUR TEETH THE MORNING OF SURGERY WITH YOUR REGULAR TOOTHPASTE  DENTURES WILL BE REMOVED PRIOR TO SURGERY PLEASE DO NOT APPLY Poly grip OR ADHESIVES!!!   Do NOT smoke after Midnight   Stop all vitamins and herbal supplements 7 days before surgery.   Take these medicines the morning of surgery with A SIP OF WATER :  allopurinol , cardizem , inhalers as usual and bring, toprol    DO NOT TAKE ANY ORAL DIABETIC MEDICATIONS DAY OF YOUR SURGERY  Bring CPAP mask and tubing day of surgery.                              You may not have any metal on your body including hair pins, jewelry, and body piercing             Do not wear make-up, lotions, powders, perfumes/cologne, or  deodorant  Do not wear nail polish including gel and S&S, artificial/acrylic nails, or any other type of covering on natural nails including finger and toenails. If you have artificial nails, gel coating, etc. that needs to be removed by a nail salon please have this removed prior to surgery or surgery may need to be canceled/ delayed if the surgeon/ anesthesia feels like they are unable to be safely monitored.   Do not shave  48 hours prior to surgery.               Men may shave face and neck.   Do not bring valuables to the hospital. [facility] IS NOT             RESPONSIBLE   FOR VALUABLES.   Contacts, glasses, dentures or bridgework may not be worn into surgery.   Bring small overnight bag day of surgery.   DO NOT BRING YOUR HOME MEDICATIONS TO THE HOSPITAL. PHARMACY WILL DISPENSE MEDICATIONS LISTED ON YOUR  MEDICATION LIST TO YOU DURING YOUR ADMISSION IN THE HOSPITAL!    Patients discharged on the day of surgery will not be allowed to drive home.  Someone NEEDS to stay with you for the first 24 hours after anesthesia.   Special Instructions: Bring a copy of your healthcare power of attorney and living will documents the day of surgery if you haven't scanned them before.              Please read over the following fact sheets you were given: IF YOU HAVE QUESTIONS ABOUT YOUR PRE-OP INSTRUCTIONS PLEASE CALL [phone].   If you received a COVID test during your pre-op visit  it is requested that you wear a mask when out in public, stay away from anyone that may not be feeling well and notify your surgeon if you develop symptoms. If you test positive for Covid or have been in contact with anyone that has tested positive in the last 10 days please notify you surgeon.    [place] - Preparing for Surgery Before surgery, you can play an important role.  Because skin is not sterile, your skin needs to be as free of germs as possible.  You can reduce the number of germs on your skin by  washing with CHG (chlorahexidine gluconate) soap before surgery.  CHG is an antiseptic cleaner which kills germs and bonds with the skin to continue killing germs even after washing. Please DO NOT use if you have an allergy to CHG or antibacterial soaps.  If your skin becomes reddened/irritated stop using the CHG and inform your nurse when you arrive at Short Stay. Do not shave (including legs and underarms) for at least 48 hours prior to the first CHG shower.  You may shave your face/neck. Please follow these instructions carefully:  1.  Shower with CHG Soap the night before surgery and the  morning of Surgery.  2.  If you choose to wash your hair, wash your hair first as usual with your  normal  shampoo.  3.  After you shampoo, rinse your hair and body thoroughly to remove the  shampoo.                           4.  Use CHG as you would any other liquid soap.  You can apply chg directly  to the skin and wash                       Gently with a scrungie or clean washcloth.  5.  Apply the CHG Soap to your body ONLY FROM THE NECK DOWN.   Do not use on face/ open                           Wound or open sores. Avoid contact with eyes, ears mouth and genitals (private parts).                       Wash face,  Genitals (private parts) with your normal soap.             6.  Wash thoroughly, paying special attention to the area where your surgery  will be performed.  7.  Thoroughly rinse your body with warm water  from the neck down.  8.  DO NOT shower/wash with your normal soap after using and rinsing off  the CHG  Soap.                9.  Pat yourself dry with a clean towel.            10.  Wear clean pajamas.            11.  Place clean sheets on your bed the night of your first shower and do not  sleep with pets. Day of Surgery : Do not apply any lotions/deodorants the morning of surgery.  Please wear clean clothes to the hospital/surgery center.  FAILURE TO FOLLOW THESE INSTRUCTIONS MAY RESULT IN THE  CANCELLATION OF YOUR SURGERY PATIENT SIGNATURE_________________________________  NURSE SIGNATURE__________________________________  ________________________________________________________________________

## 2024-01-29 ENCOUNTER — Other Ambulatory Visit: Payer: Self-pay | Admitting: Urology

## 2024-01-30 ENCOUNTER — Encounter (HOSPITAL_COMMUNITY): Payer: Self-pay

## 2024-01-30 ENCOUNTER — Encounter (HOSPITAL_COMMUNITY)
Admission: RE | Admit: 2024-01-30 | Discharge: 2024-01-30 | Disposition: A | Discharge status: Home / Self Care | Source: Ambulatory Visit | Attending: Urology | Admitting: Urology

## 2024-01-30 ENCOUNTER — Other Ambulatory Visit: Payer: Self-pay

## 2024-01-30 VITALS — BP 156/76 | HR 86 | Temp 98.4°F | Resp 16 | Ht 62.75 in | Wt 240.0 lb

## 2024-01-30 DIAGNOSIS — Z79899 Other long term (current) drug therapy: Secondary | ICD-10-CM | POA: Insufficient documentation

## 2024-01-30 DIAGNOSIS — Z7984 Long term (current) use of oral hypoglycemic drugs: Secondary | ICD-10-CM | POA: Diagnosis not present

## 2024-01-30 DIAGNOSIS — Z01812 Encounter for preprocedural laboratory examination: Secondary | ICD-10-CM | POA: Diagnosis not present

## 2024-01-30 DIAGNOSIS — M1990 Unspecified osteoarthritis, unspecified site: Secondary | ICD-10-CM | POA: Insufficient documentation

## 2024-01-30 DIAGNOSIS — G4733 Obstructive sleep apnea (adult) (pediatric): Secondary | ICD-10-CM | POA: Insufficient documentation

## 2024-01-30 DIAGNOSIS — Z87442 Personal history of urinary calculi: Secondary | ICD-10-CM | POA: Diagnosis not present

## 2024-01-30 DIAGNOSIS — I110 Hypertensive heart disease with heart failure: Secondary | ICD-10-CM | POA: Insufficient documentation

## 2024-01-30 DIAGNOSIS — N200 Calculus of kidney: Secondary | ICD-10-CM | POA: Diagnosis not present

## 2024-01-30 DIAGNOSIS — Z7985 Long-term (current) use of injectable non-insulin antidiabetic drugs: Secondary | ICD-10-CM | POA: Diagnosis not present

## 2024-01-30 DIAGNOSIS — E119 Type 2 diabetes mellitus without complications: Secondary | ICD-10-CM | POA: Insufficient documentation

## 2024-01-30 DIAGNOSIS — Z6841 Body Mass Index (BMI) 40.0 and over, adult: Secondary | ICD-10-CM | POA: Insufficient documentation

## 2024-01-30 DIAGNOSIS — I5032 Chronic diastolic (congestive) heart failure: Secondary | ICD-10-CM | POA: Diagnosis not present

## 2024-01-30 DIAGNOSIS — I2510 Atherosclerotic heart disease of native coronary artery without angina pectoris: Secondary | ICD-10-CM | POA: Insufficient documentation

## 2024-01-30 DIAGNOSIS — E669 Obesity, unspecified: Secondary | ICD-10-CM | POA: Diagnosis not present

## 2024-01-30 DIAGNOSIS — Z01818 Encounter for other preprocedural examination: Secondary | ICD-10-CM | POA: Diagnosis not present

## 2024-01-30 DIAGNOSIS — R002 Palpitations: Secondary | ICD-10-CM | POA: Insufficient documentation

## 2024-01-30 HISTORY — DX: Unspecified osteoarthritis, unspecified site: M19.90

## 2024-01-30 LAB — CBC
HCT: 42.5 % (ref 36.0–46.0)
Hemoglobin: 13.4 g/dL (ref 12.0–15.0)
MCH: 28.2 pg (ref 26.0–34.0)
MCHC: 31.5 g/dL (ref 30.0–36.0)
MCV: 89.3 fL (ref 80.0–100.0)
Platelets: 319 K/uL (ref 150–400)
RBC: 4.76 MIL/uL (ref 3.87–5.11)
RDW: 13.6 % (ref 11.5–15.5)
WBC: 9.6 K/uL (ref 4.0–10.5)
nRBC: 0 % (ref 0.0–0.2)

## 2024-01-30 LAB — BASIC METABOLIC PANEL WITH GFR
Anion gap: 8 (ref 5–15)
BUN: 15 mg/dL (ref 8–23)
CO2: 24 mmol/L (ref 22–32)
Calcium: 9.1 mg/dL (ref 8.9–10.3)
Chloride: 105 mmol/L (ref 98–111)
Creatinine, Ser: 0.89 mg/dL (ref 0.44–1.00)
GFR, Estimated: >60 mL/min (ref >60)
Glucose, Bld: 155 mg/dL — ABNORMAL HIGH (ref 70–99)
Potassium: 3.9 mmol/L (ref 3.5–5.1)
Sodium: 137 mmol/L (ref 135–145)

## 2024-01-30 LAB — GLUCOSE, CAPILLARY: Glucose-Capillary: 146 mg/dL — ABNORMAL HIGH (ref 70–99)

## 2024-01-30 NOTE — Progress Notes (Addendum)
 Anesthesia Review:  PCP: Sari Pay  Cardiologist : Wilbert Bihari  Dayna Dunn,PAC LOV  11/30/23   PPM/ ICD: Device Orders: Rep Notified:  Chest x-ray : EKG : 11/30/23  Echo : 01/15/24  Carotids- 01/16/24  Vascular Studies- 12/15/23  CTCors- 2020  Stress test: 2021  Cardiac Cath :   Activity level: cannot do a flight of stairs without difficulty  Sleep Study/ CPAP : has cpap  Fasting Blood Sugar :      / Checks Blood Sugar -- times a day:     DM- type 2- checks glucose once daily in am  Hgba1c- 01/30/24- 7.4 - routed to Dr Shane on 01/31/24.  Metformin - none am of surgeyr  Pat- last dose on 01/29/24   Blood Thinner/ Instructions /Last Dose: ASA / Instructions/ Last Dose :    01/01/24- cystoscopy    PT was 20 minutes late for preop appt.    PT has shoe on left foot.  PT with hx of foot injury 30 years ago and now has a stress fracture in left foot.  Shoe due to be removed week of 05/07/2024 per pt.    Latex Allergy    PT called on 02/01/24  and asked for call back.  Called pt back and she stated she was taking Bactim-Septra  800-160 2 x times daily for 14 days.  Added to med lst. Instructed pt not to take am of surgery since it states to take with food and to let DR showalter know am of surgery that she has 2 pills. Left on surgery day.  PT voiced understanding.   PT also asked if she could continue probiotic.  Informed pt she could.  PT voiced understanDing.

## 2024-01-31 ENCOUNTER — Encounter (HOSPITAL_COMMUNITY): Payer: Self-pay

## 2024-01-31 LAB — HEMOGLOBIN A1C
Hgb A1c MFr Bld: 7.4 % — ABNORMAL HIGH (ref 4.8–5.6)
Mean Plasma Glucose: 166 mg/dL

## 2024-01-31 NOTE — Anesthesia Preprocedure Evaluation (Signed)
 Anesthesia Evaluation  Patient identified by MRN, date of birth, ID band Patient awake    Reviewed: Allergy & Precautions, NPO status , Patient's Chart, lab work & pertinent test results  History of Anesthesia Complications (+) PONV and history of anesthetic complications  Airway Mallampati: II  TM Distance: >3 FB Neck ROM: Full    Dental  (+) Missing,    Pulmonary sleep apnea and Continuous Positive Airway Pressure Ventilation    Pulmonary exam normal        Cardiovascular hypertension, Pt. on medications Normal cardiovascular exam  TTE 2017: EF 60-65%, moderate to severe LAE, valves ok    Neuro/Psych   Anxiety        GI/Hepatic ,GERD  Medicated and Controlled,,  Endo/Other  diabetes, Type 2, Oral Hypoglycemic Agents    Renal/GU RIGHT RENAL CALCULUS     Musculoskeletal  (+) Arthritis ,    Abdominal   Peds  Hematology   Anesthesia Other Findings   Reproductive/Obstetrics                              Anesthesia Physical Anesthesia Plan  ASA: 2  Anesthesia Plan: General   Post-op Pain Management: Tylenol  PO (pre-op)*   Induction: Intravenous  PONV Risk Score and Plan: 3 and Treatment may vary due to age or medical condition, Ondansetron , Dexamethasone , Midazolam  and Propofol  infusion  Airway Management Planned: LMA  Additional Equipment: None  Intra-op Plan:   Post-operative Plan: Extubation in OR  Informed Consent: I have reviewed the patients History and Physical, chart, labs and discussed the procedure including the risks, benefits and alternatives for the proposed anesthesia with the patient or authorized representative who has indicated his/her understanding and acceptance.     Dental advisory given  Plan Discussed with: CRNA  Anesthesia Plan Comments: (See PAT note from 8/12)         Anesthesia Quick Evaluation

## 2024-01-31 NOTE — Progress Notes (Signed)
 Case: [id number] Date/Time: 02/08/24 0715   Procedures:      CYSTOSCOPY/URETEROSCOPY/HOLMIUM LASER/STENT PLACEMENT (Left)     CYSTOSCOPY, WITH RETROGRADE PYELOGRAM (Left)   Anesthesia type: General   Diagnosis: Calculus of kidney [N20.0]   Pre-op diagnosis: BILATERAL URETERAL STONE   Location: [facility] 03 / [facility]   Surgeons: Shane Steffan BROCKS, MD       DISCUSSION: Jaime Nichols is a 63 yo female with PMH of HTN, HFpEF, palpitations, nonobstructive CAD (by CT), OSA (uses CPAP), DM (A1c 7.4), kidney stones, arthritis, obesity (BMI 42)  Patient underwent cystoscopy and stent placement on 01/01/2024 with Dr. Shane.  No complications noted.  Patient follows with cardiology for history of hypertension, HFpEF, and nonobstructive CAD by coronary CTA in 2020.  She has chronic DOE.  She reported some mild pedal edema and palpitations at her last office visit on 11/30/2023.  Echo was done on 01/15/2024 and showed normal LVEF 55 to 60% with grade 2 diastolic dysfunction and no significant valvular disease.  Her last stress test in 2021 was normal.  Advised to follow-up in 3 months.  Seen by PCP on 01/10/2024 for follow-up after her procedure and left foot pain.  She is currently wearing a walking boot.  X-ray was negative for any injury.  All other issues stable   LD Mounjaro: 8/11  VS: BP (!) 156/76   Pulse 86   Temp 36.9 C (Oral)   Resp 16   Ht 5' 2.75 (1.594 m)   Wt 108.9 kg   SpO2 99%   BMI 42.85 kg/m   PROVIDERS: Aisha Harvey, MD   LABS: Labs reviewed: Acceptable for surgery. (all labs ordered are listed, but only abnormal results are displayed)  Labs Reviewed  BASIC METABOLIC PANEL WITH GFR - Abnormal; Notable for the following components:      Result Value   Glucose, Bld 155 (*)    All other components within normal limits  GLUCOSE, CAPILLARY - Abnormal; Notable for the following components:   Glucose-Capillary 146 (*)    All other components within normal limits   HEMOGLOBIN A1C - Abnormal; Notable for the following components:   Hgb A1c MFr Bld 7.4 (*)    All other components within normal limits  CBC     IMAGES:   EKG 11/30/23:  Normal sinus rhythm, rate 90 Left axis deviation Nonspecific TWI in lead III Poor R wave progression similar to prior No acute changes  CV:   Echo 01/15/24:  IMPRESSIONS     1. Left ventricular ejection fraction, by estimation, is 55 to 60%. Left  ventricular ejection fraction by 3D volume is 55 %. The left ventricle has  normal function. The left ventricle has no regional wall motion  abnormalities. Left ventricular diastolic   parameters are consistent with Grade II diastolic dysfunction  (pseudonormalization). Elevated left atrial pressure.   2. Right ventricular systolic function is normal. The right ventricular  size is normal. Tricuspid regurgitation signal is inadequate for assessing  PA pressure.   3. The mitral valve is normal in structure. No evidence of mitral valve  regurgitation. No evidence of mitral stenosis.   4. The aortic valve is grossly normal. Aortic valve regurgitation is not  visualized. No aortic stenosis is present.   5. The inferior vena cava is normal in size with greater than 50%  respiratory variability, suggesting right atrial pressure of 3 mmHg.   Stress test 04/14/2020:  There was no ST segment deviation noted during stress.  The left ventricular ejection fraction is normal (55-65%). Nuclear stress EF: 64%. The study is normal. This is a low risk study.  Coronary CTA 08/24/2018:  IMPRESSION: 1.  Calcium  score 846 which is 99 th percentile for age and sex   2.  Non obstructive CAD see description above   3.  Normal aortic root   Note would not repeat cardiac CT in future Poor quality study due to obesity and inability to reduce HR below 80 bpm despite 20 mg iv lopressor  and 200 mg PO   Study not suitable for FFR CT due to poor opacification Past Medical  History:  Diagnosis Date   Aortic atherosclerosis (HCC)    Arthritis    CAD in native artery    very high calcium  score at 875.  coronary CTA  showed 30% RCA and LAD.   Chronic diastolic CHF (congestive heart failure) (HCC) 2012   Diabetes mellitus type 2 in obese    Dyspnea    with exertion   Fatty liver    Fibroid    History of kidney stones 09/2019   HTN (hypertension)    Hyperlipidemia    Morbid obesity (HCC)    OSA on CPAP    PAT (paroxysmal atrial tachycardia) (HCC)    Premature atrial contractions    PVC's (premature ventricular contractions)     Past Surgical History:  Procedure Laterality Date   ABDOMINAL HYSTERECTOMY  06/20/2008   BSO   Benign uterine polyps  01/18/2009   cholwecystectomy      CYSTOSCOPY W/ URETERAL STENT PLACEMENT Right 07/02/2021   Procedure: CYSTOSCOPY WITH RETROGRADE PYELOGRAM/URETERAL STENT PLACEMENT;  Surgeon: Rosalind Zachary NOVAK, MD;  Location: [facility];  Service: Urology;  Laterality: Right;   CYSTOSCOPY/RETROGRADE/URETEROSCOPY Left 01/01/2024   Procedure: CYSTOSCOPY/RETROGRADE/STENT PLACEMENT;  Surgeon: Shane Steffan BROCKS, MD;  Location: [facility];  Service: Urology;  Laterality: Left;  WITH LEFT URETERAL STENT PLACEMENT   CYSTOSCOPY/URETEROSCOPY/HOLMIUM LASER/STENT PLACEMENT Right 07/28/2021   Procedure: RIGHT URETEROSCOPY/ RETROGRADE / HOLMIUM LASER/ STONE BASKETRY, STENT EXCHANGE;  Surgeon: Cam Morene ORN, MD;  Location: [facility];  Service: Urology;  Laterality: Right;   DILATION AND CURETTAGE OF UTERUS  2010   EXTRACORPOREAL SHOCK WAVE LITHOTRIPSY Left 06/05/2023   Procedure: LEFT EXTRACORPOREAL SHOCK WAVE LITHOTRIPSY (ESWL);  Surgeon: Alvaro Ricardo NOVAK Mickey., MD;  Location: [facility];  Service: Urology;  Laterality: Left;   HYSTEROSCOPY  2010   TONSILLECTOMY     URETEROSCOPY WITH HOLMIUM LASER LITHOTRIPSY Left 2021    MEDICATIONS:  allopurinol  (ZYLOPRIM ) 300 MG tablet   colchicine 0.6 MG tablet   D  1000 25 MCG (1000 UT) capsule   diltiazem  (CARDIZEM  CD) 120 MG 24 hr capsule   estradiol  (ESTRACE ) 0.1 MG/GM vaginal cream   Evolocumab  (REPATHA  SURECLICK) 140 MG/ML SOAJ   furosemide  (LASIX ) 20 MG tablet   hyoscyamine  (ANASPAZ ) 0.125 MG TBDP disintergrating tablet   levalbuterol  (XOPENEX  HFA) 45 MCG/ACT inhaler   lisinopril  (ZESTRIL ) 10 MG tablet   meloxicam (MOBIC) 15 MG tablet   metFORMIN  (GLUCOPHAGE -XR) 500 MG 24 hr tablet   metoprolol  succinate (TOPROL -XL) 25 MG 24 hr tablet   phenazopyridine  (PYRIDIUM ) 200 MG tablet   Potassium Citrate  15 MEQ (1620 MG) TBCR   PRESCRIPTION MEDICATION   tamsulosin  (FLOMAX ) 0.4 MG CAPS capsule   tamsulosin  (FLOMAX ) 0.4 MG CAPS capsule   terbinafine  (LAMISIL ) 250 MG tablet   tirzepatide (MOUNJARO) 15 MG/0.5ML Pen   tiZANidine  (ZANAFLEX ) 4 MG tablet   No current  facility-administered medications for this encounter.   Burnard CHRISTELLA Odis DEVONNA [facility] Stay/Anesthesiology [facility] Phone [phone] 01/31/2024 1:11 PM

## 2024-02-01 DIAGNOSIS — I509 Heart failure, unspecified: Secondary | ICD-10-CM | POA: Diagnosis not present

## 2024-02-01 DIAGNOSIS — Z87442 Personal history of urinary calculi: Secondary | ICD-10-CM | POA: Diagnosis not present

## 2024-02-01 DIAGNOSIS — E1165 Type 2 diabetes mellitus with hyperglycemia: Secondary | ICD-10-CM | POA: Diagnosis not present

## 2024-02-05 DIAGNOSIS — M19072 Primary osteoarthritis, left ankle and foot: Secondary | ICD-10-CM | POA: Diagnosis not present

## 2024-02-06 NOTE — H&P (Incomplete)
 63 year old female with a history of bilateral nephrolithiasis. She has history of right sided ureteroscopy and left ESWL in 2024.   Nephrolithiasis:  01/01/2024: Pt having left sided pain. Pain is worsening. No Nausea, no burning with urination. Pt did not take Axo or pyridium . Patient has had sepsis 2x. UA is nitrate. CT today demonstrates left ureteral stent. Patient has had 2 episodes of obstructive pyelonephritis. UA is concerning for infection. Due to infection and previous pyelonephritis will stent today plan for outpatient ureteroscopy. Patient also complains of progressively worsening pain.  02/08/24: Left distal ureteral stone and R proximal stone will do bilateral URS today.     ALLERGIES: latex Nickel Statin Drugs    MEDICATIONS: Estrace  0.1 MG/GM Cream 1 gram Per Urethra Every Other Day  Lisinopril  10 MG Tablet  metFORMIN  HCl 1000 MG Tablet  Metoprolol  Tartrate 25 MG Tablet  Allopurinol  100 MG Tablet Daily  Celebrex  Colchicine 0.6 MG Tablet PO PRN  dilTIAZem  HCl ER Coated Beads 120 MG Capsule Extended Release 24 Hour  Mounjaro  Potassium Citrate  ER 10 MEQ (1080 MG) Tablet Extended Release  Repatha  Pushtronex     GU PSH: Hysterectomy - about 2010 Ureteroscopic laser litho - 2023     NON-GU PSH: Cystourethroscopy, With Ureteroscopy And/or Pyeloscopy; With Endoscopic Laser Treatment Of Ureteral - about 2021 Visit Complexity (formerly GPC1X) - 07/24/2023     GU PMH: Mixed incontinence - 11/21/2023, - 11/07/2023, - 09/18/2023 Incontinence w/o Sensation - 08/17/2023 Stress Incontinence - 08/17/2023 Renal calculus - 07/24/2023, - 06/22/2023, - 02/13/2023, - 12/16/2022, - 06/17/2022, - 2023, - 2023, - 2023, - 2022, - 2022, Discussed observation verses ESWL or ureteroscopy. Patient wishes to watch the stones for now and will come back in 6 months with a KUB., - 2022, Continue potassium citrate  10 mEq b.i.d. will obtain a renal ultrasound and KUB in 6 months., - 2021, Reviewed patient's  most recent CT scan report which shows 2 nonobstructing left renal calculi. Discussed observation based on size of these. Patient is on potassium citrate  3 times a day and is also followed by Nephrology. Also recommended obtaining 24 hour urine however patient reports that she is incontinent and will not be able to collect inaccurate specimen., - 2021 Acute Cystitis/UTI - 02/13/2023, - 12/16/2022, - 12/12/2022, - 2022 Pelvic/perineal pain - 02/13/2023, - 12/16/2022, Patient continues to use vaginal estrogen cream twice weekly which helps her pelvic pain., - 2022, Continue estradiol  cream in vagina twice weekly, - 2021, Would recommend patient restart vaginal estrogen cream and discussed a 14 day induction course followed by twice weekly at night. When she follows up will discuss symptoms of urinary incontinence., - 2021 Urge incontinence - 06/17/2022, Patient not interested in trying VESIcare and we briefly talked about a urodynamic study. Patient wishes to talk about this again at her next visit., - 2021, - 2021 Urinary Urgency, Patient has been prescribed VESIcare in the past and recommend that she resume taking it. Unclear if she would like to continue this. - 2021    NON-GU PMH: Muscle weakness (generalized) - 11/21/2023, - 11/07/2023, - 09/18/2023, - 08/17/2023 Other muscle spasm - 11/21/2023, - 11/07/2023, - 09/18/2023 Other specified disorders of muscle - 11/21/2023, - 11/07/2023, - 09/18/2023    FAMILY HISTORY: Atherosclerosis - Father Heart Attack - Father pancreatic cancer - Mother   SOCIAL HISTORY: Marital Status: Married Preferred Language: English; Ethnicity: Not Hispanic Or Latino; Race: White Current Smoking Status: Patient has never smoked.   Tobacco Use Assessment Completed: Used  Tobacco in last 30 days? Does not use smokeless tobacco. Drinks 3 drinks per year.  Does not use drugs. Does not drink caffeine. Has not had a blood transfusion. Patient's occupation Pharmacist, community, Chiropractor.    REVIEW OF SYSTEMS:    GU Review Female:   Patient reports frequent urination, hard to postpone urination, and leakage of urine. Patient denies burning /pain with urination, get up at night to urinate, stream starts and stops, trouble starting your stream, have to strain to urinate, and being pregnant.  Gastrointestinal (Upper):   Patient denies nausea, vomiting, and indigestion/ heartburn.  Gastrointestinal (Lower):   Patient denies diarrhea and constipation.  Constitutional:   Patient denies fever, night sweats, weight loss, and fatigue.  Skin:   Patient denies skin rash/ lesion and itching.  Eyes:   Patient denies blurred vision and double vision.  Ears/ Nose/ Throat:   Patient denies sore throat and sinus problems.  Hematologic/Lymphatic:   Patient denies swollen glands and easy bruising.  Cardiovascular:   Patient denies leg swelling and chest pains.  Respiratory:   Patient denies cough and shortness of breath.  Endocrine:   Patient denies excessive thirst.  Musculoskeletal:   Patient reports back pain. Patient denies joint pain.  Neurological:   Patient denies headaches and dizziness.  Psychologic:   Patient denies depression and anxiety.   VITAL SIGNS:      01/01/2024 10:04 AM  BP 132/75 mmHg  Pulse 94 /min  Temperature 98.0 F / 36.6 C   Complexity of Data:  Source Of History:  Patient  Urine Test Review:   Urinalysis  X-Ray Review: C.T. Stone Protocol: Reviewed Films. Discussed With Patient. Right-sided nonobstructing stone. Left-sided nonobstructing lower pole stone and left proximal ureteral stone. Mild hydronephrosis.    PROCEDURES:         C.T. Urogram - [id number]      Patient confirmed No Neulasta OnPro Device.         Visit Complexity - G2211          Urinalysis w/Scope Dipstick Dipstick Cont'd Micro  Color: Yellow Bilirubin: Neg mg/dL WBC/hpf: 0 - 5/hpf  Appearance: Clear Ketones: Neg mg/dL RBC/hpf: 0 - 2/hpf  Specific Gravity: 1.025 Blood: 2+  ery/uL Bacteria: Few (10-25/hpf)  pH: 5.5 Protein: Trace mg/dL Cystals: NS (Not Seen)  Glucose: Neg mg/dL Urobilinogen: 0.2 mg/dL Casts: NS (Not Seen)    Nitrites: Positive Trichomonas: Not Present    Leukocyte Esterase: Neg leu/uL Mucous: Not Present      Epithelial Cells: 0 - 5/hpf      Yeast: NS (Not Seen)      Sperm: Not Present    Notes: micro performed on unspun urine due to QNS    ASSESSMENT:      ICD-10 Details  1 GU:   Renal calculus - N20.0   2   Ureteral calculus - N20.1      PLAN:           Orders Labs Urine Culture  X-Rays: C.T. Stone Protocol Without I.V. Contrast  X-Ray Notes: History:   Hematuria: Yes / No   Patient to see MD after exam: Yes/ No   Previous exam:   When:   Where:   Diabetic: Yes / No   BUN/ Creatinine:   Date of last BUN Creatinine:   Weight in pounds:   Allergy- IV Contrast: Yes/ No  Prior Authorization #: CHARON barrows #[id number] valid 01/01/24 thru 01/30/24  Schedule         Document Letter(s):  Created for Patient: Clinical Summary         Notes:   Left ureteral calculus: Patient with 2 episodes of obstructive pyelonephritis urinalysis is nitrite positive. Has a history of obstructive Pilo we will plan to stent today and have soon follow-up for left ureteroscopy. UCx ordered.   She also has a Right proximal stone.  Will plan to do bilateral URS today   Ucx treated with bactrim    We discussed the risk benefits and alternatives to ureteroscopy. This includes bleeding, infection, damage to surrounding structures including the urethra, bladder, ureter, and kidney. With these possible injuries resulting in need for intervention in the future. We discussed inability to remove all the stone and requiring follow-up ureteroscopy. We also discussed the possibility of not being able to gain access to the kidney and the need for nephrostomy tube. Possibility of long-term stent was also discussed. The patient voiced their  understanding and would like to proceed.

## 2024-02-07 ENCOUNTER — Encounter (HOSPITAL_COMMUNITY): Payer: Self-pay

## 2024-02-07 ENCOUNTER — Emergency Department (HOSPITAL_COMMUNITY)

## 2024-02-07 ENCOUNTER — Other Ambulatory Visit: Payer: Self-pay

## 2024-02-07 ENCOUNTER — Inpatient Hospital Stay (HOSPITAL_COMMUNITY)
Admission: EM | Admit: 2024-02-07 | Discharge: 2024-02-16 | DRG: 854 | Disposition: A | Discharge status: Home Health | Attending: Internal Medicine | Admitting: Internal Medicine

## 2024-02-07 DIAGNOSIS — E119 Type 2 diabetes mellitus without complications: Secondary | ICD-10-CM | POA: Diagnosis not present

## 2024-02-07 DIAGNOSIS — Z7984 Long term (current) use of oral hypoglycemic drugs: Secondary | ICD-10-CM | POA: Diagnosis not present

## 2024-02-07 DIAGNOSIS — A419 Sepsis, unspecified organism: Secondary | ICD-10-CM | POA: Diagnosis not present

## 2024-02-07 DIAGNOSIS — Z8249 Family history of ischemic heart disease and other diseases of the circulatory system: Secondary | ICD-10-CM | POA: Diagnosis not present

## 2024-02-07 DIAGNOSIS — A4159 Other Gram-negative sepsis: Secondary | ICD-10-CM | POA: Diagnosis not present

## 2024-02-07 DIAGNOSIS — R109 Unspecified abdominal pain: Secondary | ICD-10-CM | POA: Diagnosis not present

## 2024-02-07 DIAGNOSIS — N200 Calculus of kidney: Secondary | ICD-10-CM | POA: Diagnosis not present

## 2024-02-07 DIAGNOSIS — I5032 Chronic diastolic (congestive) heart failure: Secondary | ICD-10-CM | POA: Diagnosis present

## 2024-02-07 DIAGNOSIS — Z6841 Body Mass Index (BMI) 40.0 and over, adult: Secondary | ICD-10-CM | POA: Diagnosis not present

## 2024-02-07 DIAGNOSIS — Z791 Long term (current) use of non-steroidal anti-inflammatories (NSAID): Secondary | ICD-10-CM

## 2024-02-07 DIAGNOSIS — Z960 Presence of urogenital implants: Secondary | ICD-10-CM | POA: Diagnosis not present

## 2024-02-07 DIAGNOSIS — D649 Anemia, unspecified: Secondary | ICD-10-CM | POA: Diagnosis not present

## 2024-02-07 DIAGNOSIS — Z01818 Encounter for other preprocedural examination: Secondary | ICD-10-CM

## 2024-02-07 DIAGNOSIS — Z881 Allergy status to other antibiotic agents status: Secondary | ICD-10-CM

## 2024-02-07 DIAGNOSIS — N202 Calculus of kidney with calculus of ureter: Secondary | ICD-10-CM | POA: Diagnosis present

## 2024-02-07 DIAGNOSIS — I110 Hypertensive heart disease with heart failure: Secondary | ICD-10-CM | POA: Diagnosis present

## 2024-02-07 DIAGNOSIS — N281 Cyst of kidney, acquired: Secondary | ICD-10-CM | POA: Diagnosis not present

## 2024-02-07 DIAGNOSIS — I10 Essential (primary) hypertension: Secondary | ICD-10-CM | POA: Diagnosis not present

## 2024-02-07 DIAGNOSIS — N10 Acute tubulo-interstitial nephritis: Secondary | ICD-10-CM

## 2024-02-07 DIAGNOSIS — E66813 Obesity, class 3: Secondary | ICD-10-CM | POA: Diagnosis not present

## 2024-02-07 DIAGNOSIS — M1A9XX0 Chronic gout, unspecified, without tophus (tophi): Secondary | ICD-10-CM

## 2024-02-07 DIAGNOSIS — Z90710 Acquired absence of both cervix and uterus: Secondary | ICD-10-CM | POA: Diagnosis not present

## 2024-02-07 DIAGNOSIS — N12 Tubulo-interstitial nephritis, not specified as acute or chronic: Secondary | ICD-10-CM | POA: Diagnosis not present

## 2024-02-07 DIAGNOSIS — K5900 Constipation, unspecified: Secondary | ICD-10-CM | POA: Diagnosis present

## 2024-02-07 DIAGNOSIS — N201 Calculus of ureter: Secondary | ICD-10-CM | POA: Diagnosis not present

## 2024-02-07 DIAGNOSIS — M109 Gout, unspecified: Secondary | ICD-10-CM | POA: Diagnosis not present

## 2024-02-07 DIAGNOSIS — Z79899 Other long term (current) drug therapy: Secondary | ICD-10-CM | POA: Diagnosis not present

## 2024-02-07 DIAGNOSIS — I2510 Atherosclerotic heart disease of native coronary artery without angina pectoris: Secondary | ICD-10-CM | POA: Diagnosis present

## 2024-02-07 DIAGNOSIS — Z833 Family history of diabetes mellitus: Secondary | ICD-10-CM | POA: Diagnosis not present

## 2024-02-07 DIAGNOSIS — Z818 Family history of other mental and behavioral disorders: Secondary | ICD-10-CM | POA: Diagnosis not present

## 2024-02-07 DIAGNOSIS — K429 Umbilical hernia without obstruction or gangrene: Secondary | ICD-10-CM | POA: Diagnosis not present

## 2024-02-07 DIAGNOSIS — G4733 Obstructive sleep apnea (adult) (pediatric): Secondary | ICD-10-CM | POA: Diagnosis not present

## 2024-02-07 DIAGNOSIS — Z1612 Extended spectrum beta lactamase (ESBL) resistance: Secondary | ICD-10-CM | POA: Diagnosis not present

## 2024-02-07 DIAGNOSIS — N136 Pyonephrosis: Secondary | ICD-10-CM | POA: Diagnosis present

## 2024-02-07 DIAGNOSIS — N3001 Acute cystitis with hematuria: Secondary | ICD-10-CM

## 2024-02-07 DIAGNOSIS — E785 Hyperlipidemia, unspecified: Secondary | ICD-10-CM | POA: Diagnosis present

## 2024-02-07 DIAGNOSIS — Z1624 Resistance to multiple antibiotics: Secondary | ICD-10-CM | POA: Diagnosis not present

## 2024-02-07 DIAGNOSIS — N390 Urinary tract infection, site not specified: Secondary | ICD-10-CM | POA: Diagnosis present

## 2024-02-07 DIAGNOSIS — Z91048 Other nonmedicinal substance allergy status: Secondary | ICD-10-CM

## 2024-02-07 DIAGNOSIS — K760 Fatty (change of) liver, not elsewhere classified: Secondary | ICD-10-CM | POA: Diagnosis present

## 2024-02-07 DIAGNOSIS — B9629 Other Escherichia coli [E. coli] as the cause of diseases classified elsewhere: Secondary | ICD-10-CM | POA: Diagnosis not present

## 2024-02-07 DIAGNOSIS — Z91040 Latex allergy status: Secondary | ICD-10-CM

## 2024-02-07 DIAGNOSIS — Z888 Allergy status to other drugs, medicaments and biological substances status: Secondary | ICD-10-CM

## 2024-02-07 DIAGNOSIS — N132 Hydronephrosis with renal and ureteral calculous obstruction: Secondary | ICD-10-CM | POA: Diagnosis not present

## 2024-02-07 LAB — COMPREHENSIVE METABOLIC PANEL WITH GFR
ALT: 14 U/L (ref 0–44)
AST: 13 U/L — ABNORMAL LOW (ref 15–41)
Albumin: 4 g/dL (ref 3.5–5.0)
Alkaline Phosphatase: 101 U/L (ref 38–126)
Anion gap: 12 (ref 5–15)
BUN: 19 mg/dL (ref 8–23)
CO2: 19 mmol/L — ABNORMAL LOW (ref 22–32)
Calcium: 9.3 mg/dL (ref 8.9–10.3)
Chloride: 102 mmol/L (ref 98–111)
Creatinine, Ser: 1.03 mg/dL — ABNORMAL HIGH (ref 0.44–1.00)
GFR, Estimated: >60 mL/min (ref >60)
Glucose, Bld: 215 mg/dL — ABNORMAL HIGH (ref 70–99)
Potassium: 4.2 mmol/L (ref 3.5–5.1)
Sodium: 133 mmol/L — ABNORMAL LOW (ref 135–145)
Total Bilirubin: 1.1 mg/dL (ref 0.0–1.2)
Total Protein: 7.8 g/dL (ref 6.5–8.1)

## 2024-02-07 LAB — CBC WITH DIFFERENTIAL/PLATELET
Abs Immature Granulocytes: 0.17 K/uL — ABNORMAL HIGH (ref 0.00–0.07)
Basophils Absolute: 0.1 K/uL (ref 0.0–0.1)
Basophils Relative: 0 %
Eosinophils Absolute: 0 K/uL (ref 0.0–0.5)
Eosinophils Relative: 0 %
HCT: 41.9 % (ref 36.0–46.0)
Hemoglobin: 13.5 g/dL (ref 12.0–15.0)
Immature Granulocytes: 1 %
Lymphocytes Relative: 6 %
Lymphs Abs: 1.1 K/uL (ref 0.7–4.0)
MCH: 28 pg (ref 26.0–34.0)
MCHC: 32.2 g/dL (ref 30.0–36.0)
MCV: 86.7 fL (ref 80.0–100.0)
Monocytes Absolute: 1.1 K/uL — ABNORMAL HIGH (ref 0.1–1.0)
Monocytes Relative: 6 %
Neutro Abs: 17.1 K/uL — ABNORMAL HIGH (ref 1.7–7.7)
Neutrophils Relative %: 87 %
Platelets: 284 K/uL (ref 150–400)
RBC: 4.83 MIL/uL (ref 3.87–5.11)
RDW: 13.4 % (ref 11.5–15.5)
WBC: 19.5 K/uL — ABNORMAL HIGH (ref 4.0–10.5)
nRBC: 0 % (ref 0.0–0.2)

## 2024-02-07 LAB — PROTIME-INR
INR: 1.1 (ref 0.8–1.2)
Prothrombin Time: 14.5 s (ref 11.4–15.2)

## 2024-02-07 LAB — URINALYSIS, W/ REFLEX TO CULTURE (INFECTION SUSPECTED)
Bilirubin Urine: NEGATIVE
Glucose, UA: NEGATIVE mg/dL
Ketones, ur: NEGATIVE mg/dL
Nitrite: POSITIVE — AB
Protein, ur: 100 mg/dL — AB
RBC / HPF: >50 RBC/hpf (ref 0–5)
Specific Gravity, Urine: 1.023 (ref 1.005–1.030)
WBC, UA: >50 WBC/hpf (ref 0–5)
pH: 5 (ref 5.0–8.0)

## 2024-02-07 LAB — CBG MONITORING, ED: Glucose-Capillary: 197 mg/dL — ABNORMAL HIGH (ref 70–99)

## 2024-02-07 LAB — I-STAT CG4 LACTIC ACID, ED: Lactic Acid, Venous: 1.5 mmol/L (ref 0.5–1.9)

## 2024-02-07 MED ORDER — DILTIAZEM HCL ER COATED BEADS 120 MG PO CP24
120.0000 mg | ORAL_CAPSULE | Freq: Every day | ORAL | Status: DC
  Administered 2024-02-08 – 2024-02-16 (×8): 120 mg via ORAL
  Filled 2024-02-07 (×8): qty 1

## 2024-02-07 MED ORDER — SODIUM CHLORIDE 0.9 % IV SOLN
2.0000 g | Freq: Once | INTRAVENOUS | Status: AC
  Administered 2024-02-07: 2 g via INTRAVENOUS
  Filled 2024-02-07: qty 12.5

## 2024-02-07 MED ORDER — SENNOSIDES-DOCUSATE SODIUM 8.6-50 MG PO TABS: 1.0000 | ORAL_TABLET | Freq: Every evening | ORAL | Status: DC | PRN

## 2024-02-07 MED ORDER — ENOXAPARIN SODIUM 40 MG/0.4ML IJ SOSY
40.0000 mg | PREFILLED_SYRINGE | INTRAMUSCULAR | Status: DC
  Administered 2024-02-08 – 2024-02-09 (×2): 40 mg via SUBCUTANEOUS
  Filled 2024-02-07 (×4): qty 0.4

## 2024-02-07 MED ORDER — INSULIN ASPART 100 UNIT/ML IJ SOLN
0.0000 [IU] | Freq: Three times a day (TID) | INTRAMUSCULAR | Status: DC
  Administered 2024-02-08: 5 [IU] via SUBCUTANEOUS
  Administered 2024-02-08 – 2024-02-09 (×3): 3 [IU] via SUBCUTANEOUS
  Administered 2024-02-09 – 2024-02-10 (×2): 2 [IU] via SUBCUTANEOUS
  Administered 2024-02-10 (×2): 3 [IU] via SUBCUTANEOUS
  Administered 2024-02-11 (×2): 2 [IU] via SUBCUTANEOUS
  Administered 2024-02-11: 3 [IU] via SUBCUTANEOUS
  Administered 2024-02-12: 2 [IU] via SUBCUTANEOUS
  Administered 2024-02-12 (×2): 3 [IU] via SUBCUTANEOUS
  Administered 2024-02-13 (×2): 2 [IU] via SUBCUTANEOUS
  Administered 2024-02-14 (×2): 3 [IU] via SUBCUTANEOUS
  Administered 2024-02-14: 5 [IU] via SUBCUTANEOUS
  Administered 2024-02-15: 3 [IU] via SUBCUTANEOUS
  Administered 2024-02-15: 2 [IU] via SUBCUTANEOUS
  Administered 2024-02-15: 3 [IU] via SUBCUTANEOUS
  Administered 2024-02-16 (×2): 2 [IU] via SUBCUTANEOUS
  Filled 2024-02-07: qty 0.15

## 2024-02-07 MED ORDER — GENTAMICIN SULFATE 40 MG/ML IJ SOLN
5.0000 mg/kg | INTRAVENOUS | Status: DC
  Filled 2024-02-07: qty 9.25

## 2024-02-07 MED ORDER — SODIUM CHLORIDE 0.9 % IV SOLN: INTRAVENOUS | Status: AC

## 2024-02-07 MED ORDER — ONDANSETRON HCL 4 MG PO TABS
4.0000 mg | ORAL_TABLET | Freq: Four times a day (QID) | ORAL | Status: DC | PRN
  Administered 2024-02-13: 4 mg via ORAL
  Filled 2024-02-07: qty 1

## 2024-02-07 MED ORDER — METOPROLOL SUCCINATE ER 25 MG PO TB24
25.0000 mg | ORAL_TABLET | Freq: Every day | ORAL | Status: DC
  Administered 2024-02-08 – 2024-02-16 (×8): 25 mg via ORAL
  Filled 2024-02-07 (×8): qty 1

## 2024-02-07 MED ORDER — INSULIN ASPART 100 UNIT/ML IJ SOLN
0.0000 [IU] | Freq: Every day | INTRAMUSCULAR | Status: DC
  Administered 2024-02-10 – 2024-02-13 (×2): 2 [IU] via SUBCUTANEOUS
  Filled 2024-02-07: qty 0.05

## 2024-02-07 MED ORDER — TAMSULOSIN HCL 0.4 MG PO CAPS
0.4000 mg | ORAL_CAPSULE | Freq: Every day | ORAL | Status: DC
  Administered 2024-02-07 – 2024-02-15 (×8): 0.4 mg via ORAL
  Filled 2024-02-07 (×8): qty 1

## 2024-02-07 MED ORDER — ALLOPURINOL 100 MG PO TABS
300.0000 mg | ORAL_TABLET | Freq: Every day | ORAL | Status: DC
  Administered 2024-02-08 – 2024-02-16 (×8): 300 mg via ORAL
  Filled 2024-02-07 (×8): qty 3

## 2024-02-07 MED ORDER — SODIUM CHLORIDE 0.9 % IV SOLN
1.0000 g | Freq: Three times a day (TID) | INTRAVENOUS | Status: DC
  Administered 2024-02-08 – 2024-02-13 (×17): 1 g via INTRAVENOUS
  Filled 2024-02-07 (×19): qty 20

## 2024-02-07 MED ORDER — ACETAMINOPHEN 650 MG RE SUPP: 650.0000 mg | Freq: Four times a day (QID) | RECTAL | Status: DC | PRN

## 2024-02-07 MED ORDER — ONDANSETRON HCL 4 MG/2ML IJ SOLN
4.0000 mg | Freq: Four times a day (QID) | INTRAMUSCULAR | Status: DC | PRN
  Administered 2024-02-08: 4 mg via INTRAVENOUS
  Filled 2024-02-07: qty 2

## 2024-02-07 MED ORDER — POTASSIUM CITRATE ER 15 MEQ (1620 MG) PO TBCR: 1.0000 | EXTENDED_RELEASE_TABLET | ORAL | Status: DC

## 2024-02-07 MED ORDER — BISACODYL 5 MG PO TBEC: 5.0000 mg | DELAYED_RELEASE_TABLET | Freq: Every day | ORAL | Status: DC | PRN

## 2024-02-07 MED ORDER — SODIUM CHLORIDE 0.9 % IV BOLUS
2000.0000 mL | Freq: Once | INTRAVENOUS | Status: AC
  Administered 2024-02-07: 2000 mL via INTRAVENOUS

## 2024-02-07 MED ORDER — LISINOPRIL 10 MG PO TABS
10.0000 mg | ORAL_TABLET | Freq: Every day | ORAL | Status: DC
  Administered 2024-02-08 – 2024-02-16 (×8): 10 mg via ORAL
  Filled 2024-02-07 (×8): qty 1

## 2024-02-07 MED ORDER — ACETAMINOPHEN 325 MG PO TABS
650.0000 mg | ORAL_TABLET | Freq: Four times a day (QID) | ORAL | Status: DC | PRN
  Administered 2024-02-08: 650 mg via ORAL
  Filled 2024-02-07: qty 2

## 2024-02-07 MED ORDER — ACETAMINOPHEN 325 MG PO TABS
650.0000 mg | ORAL_TABLET | Freq: Once | ORAL | Status: AC | PRN
  Administered 2024-02-07: 650 mg via ORAL
  Filled 2024-02-07: qty 2

## 2024-02-07 NOTE — Progress Notes (Signed)
 ED Pharmacy Antibiotic Sign Off An antibiotic consult was received from an ED provider for cefepime  per pharmacy dosing for sepsis. A chart review was completed to assess appropriateness.   The following one time order(s) were placed:  Cefepime  2 g  Further antibiotic and/or antibiotic pharmacy consults should be ordered by the admitting provider if indicated.   Thank you for allowing pharmacy to be a part of this patient's care.    Stefano MARLA Bologna, PharmD, BCPS Clinical Pharmacist 02/07/2024 8:31 PM

## 2024-02-07 NOTE — Consult Note (Signed)
 Urology Consult   Physician requesting consult: ED   Reason for consult: sepsis criteria s/p stent for ureteral obstructing stone  History of Present Illness: Jaime Nichols is a 62 y.o. with hx of BL nephrolithiasis. Has previously had R USE and left ESWL in 2024. Most recently, underwent L ureteral stent for obstructing infected stone on 01/01/24.   She continues to have fever and lower urinary tract symptoms, prompting her to come to the ED. At presentation, she was febrile to 103.1 but otherwise HDS. Labs notable for normal Cr ~1 and elevated WBC to 20. UA with >50 WBCs and positive LE and nitrites.   Was seen in the office about a week ago with urine culture, with results below. Has been on Bactrim  and Cipro .      Past Medical History:  Diagnosis Date   Aortic atherosclerosis (HCC)    Arthritis    CAD in native artery    very high calcium  score at 875.  coronary CTA  showed 30% RCA and LAD.   Chronic diastolic CHF (congestive heart failure) (HCC) 2012   Diabetes mellitus type 2 in obese    Dyspnea    with exertion   Fatty liver    Fibroid    History of kidney stones 09/2019   HTN (hypertension)    Hyperlipidemia    Morbid obesity (HCC)    OSA on CPAP    PAT (paroxysmal atrial tachycardia) (HCC)    Premature atrial contractions    PVC's (premature ventricular contractions)     Past Surgical History:  Procedure Laterality Date   ABDOMINAL HYSTERECTOMY  06/20/2008   BSO   Benign uterine polyps  01/18/2009   cholwecystectomy      CYSTOSCOPY W/ URETERAL STENT PLACEMENT Right 07/02/2021   Procedure: CYSTOSCOPY WITH RETROGRADE PYELOGRAM/URETERAL STENT PLACEMENT;  Surgeon: Rosalind Zachary NOVAK, MD;  Location: [facility];  Service: Urology;  Laterality: Right;   CYSTOSCOPY/RETROGRADE/URETEROSCOPY Left 01/01/2024   Procedure: CYSTOSCOPY/RETROGRADE/STENT PLACEMENT;  Surgeon: Shane Steffan BROCKS, MD;  Location: [facility];  Service: Urology;  Laterality: Left;  WITH LEFT URETERAL  STENT PLACEMENT   CYSTOSCOPY/URETEROSCOPY/HOLMIUM LASER/STENT PLACEMENT Right 07/28/2021   Procedure: RIGHT URETEROSCOPY/ RETROGRADE / HOLMIUM LASER/ STONE BASKETRY, STENT EXCHANGE;  Surgeon: Cam Morene ORN, MD;  Location: [facility];  Service: Urology;  Laterality: Right;   DILATION AND CURETTAGE OF UTERUS  2010   EXTRACORPOREAL SHOCK WAVE LITHOTRIPSY Left 06/05/2023   Procedure: LEFT EXTRACORPOREAL SHOCK WAVE LITHOTRIPSY (ESWL);  Surgeon: Alvaro Ricardo NOVAK Mickey., MD;  Location: [facility];  Service: Urology;  Laterality: Left;   HYSTEROSCOPY  2010   TONSILLECTOMY     URETEROSCOPY WITH HOLMIUM LASER LITHOTRIPSY Left 2021     Current Hospital Medications:  Home meds:  Current Facility-Administered Medications on File Prior to Encounter  Medication Dose Route Frequency Provider Last Rate Last Admin   [START ON 02/08/2024] gentamicin  (GARAMYCIN ) 370 mg in dextrose  5 % 100 mL IVPB  5 mg/kg (Adjusted) Intravenous 30 min Pre-Op Shane Steffan BROCKS, MD       Current Outpatient Medications on File Prior to Encounter  Medication Sig Dispense Refill   allopurinol  (ZYLOPRIM ) 300 MG tablet Take 300 mg by mouth daily.     colchicine 0.6 MG tablet Take 0.6 mg by mouth daily as needed (gout flares).     D 1000 25 MCG (1000 UT) capsule Take 1,000 Units by mouth 3 (three) times a week.     diltiazem  (CARDIZEM  CD) 120 MG  24 hr capsule TAKE 1 CAPSULE BY MOUTH EVERY DAY 90 capsule 1   estradiol  (ESTRACE ) 0.1 MG/GM vaginal cream Place 1 Applicatorful vaginally 3 (three) times a week.     Evolocumab  (REPATHA  SURECLICK) 140 MG/ML SOAJ INJECT 1 PEN INTO THE SKIN EVERY 14 (FOURTEEN) DAYS. 2 mL 11   furosemide  (LASIX ) 20 MG tablet Take 1 tablet (20 mg total) by mouth daily. (Patient not taking: Reported on 01/25/2024) 30 tablet 2   hyoscyamine  (ANASPAZ ) 0.125 MG TBDP disintergrating tablet Place 1 tablet (0.125 mg total) under the tongue every 6 (six) hours as needed for up to 20  doses. 20 tablet 0   levalbuterol  (XOPENEX  HFA) 45 MCG/ACT inhaler Inhale 1-2 puffs into the lungs every 4 (four) hours as needed for shortness of breath.     lisinopril  (ZESTRIL ) 10 MG tablet Take 1 tablet (10 mg total) by mouth daily. 90 tablet 3   meloxicam (MOBIC) 15 MG tablet Take 15 mg by mouth daily.     metFORMIN  (GLUCOPHAGE -XR) 500 MG 24 hr tablet Take 500 mg by mouth daily with breakfast.     metoprolol  succinate (TOPROL -XL) 25 MG 24 hr tablet TAKE 1 TABLET (25 MG TOTAL) BY MOUTH DAILY. 90 tablet 3   phenazopyridine  (PYRIDIUM ) 200 MG tablet Take 1 tablet (200 mg total) by mouth 3 (three) times daily as needed for up to 6 doses. (Patient not taking: Reported on 01/25/2024) 6 tablet 0   Potassium Citrate  15 MEQ (1620 MG) TBCR Take 15 mEq by mouth 3 (three) times a week.     PRESCRIPTION MEDICATION CPAP- At bedtime     sulfamethoxazole -trimethoprim  (BACTRIM  DS) 800-160 MG tablet Take 1 tablet by mouth 2 (two) times daily. Last dose on DOS which will be day 14.     tamsulosin  (FLOMAX ) 0.4 MG CAPS capsule Take 1 capsule (0.4 mg total) by mouth daily. (Patient not taking: Reported on 01/25/2024) 15 capsule 0   tamsulosin  (FLOMAX ) 0.4 MG CAPS capsule Take 1 capsule (0.4 mg total) by mouth daily after supper. 30 capsule 0   terbinafine  (LAMISIL ) 250 MG tablet Take 1 tablet (250 mg total) by mouth daily. (Patient not taking: Reported on 01/25/2024) 60 tablet 0   tirzepatide (MOUNJARO) 15 MG/0.5ML Pen Inject 15 mg into the skin every Monday.     tiZANidine  (ZANAFLEX ) 4 MG tablet Take 0.5 tablets (2 mg total) by mouth every 6 (six) hours as needed for muscle spasms. (Patient not taking: Reported on 01/25/2024) 30 tablet 0     Scheduled Meds: Continuous Infusions:  ceFEPime  (MAXIPIME ) IV 2 g (02/07/24 2057)   sodium chloride      PRN Meds:.  Allergies:  Allergies  Allergen Reactions   Erythromycin Diarrhea and Nausea And Vomiting    Other reaction(s): stomach upset   Latex Other (See Comments)     REACTION: CONTACT DERMATITIS   Nickel Other (See Comments)    Poor healing- dental reasons   Statins Other (See Comments)    Joint pain   Crestor  [Rosuvastatin ] Other (See Comments)    Cannot take when taking Colchicine   Dapagliflozin     Other reaction(s): worsening of urine incontinence   Hydrochlorothiazide     Previously discontinued due to uric acid kidney stones   Metformin  Hcl Diarrhea and Other (See Comments)    Diarrhea at higher doses, GI Upset   Potassium Citrate  Other (See Comments)    Affected eyes- redness and crusty at 30 mEq/daily; pt states she has no reaction to the  potassium citrate  that she is taking now and that this only happened when she was taking too much   Zocor [Simvastatin] Other (See Comments)    Joint pain    Family History  Problem Relation Age of Onset   Hypertension Mother    Heart disease Father        Died age 39s of a cardiac event - was told it was atherosclerosis but also had PVCs   Healthy Sister    Arrhythmia Sister        PVCs   Depression Brother    Healthy Sister    Diabetes Paternal Grandmother    Arrhythmia Other        Distant relative with WPW.    Social History:  reports that she has never smoked. She has never used smokeless tobacco. She reports that she does not currently use alcohol. She reports that she does not use drugs.  ROS: A complete review of systems was performed.  All systems are negative except for pertinent findings as noted.  Physical Exam:  Vital signs in last 24 hours: Temp:  [99.1 F (37.3 C)-103.1 F (39.5 C)] 99.1 F (37.3 C) (08/20 1932) Pulse Rate:  [115-127] 115 (08/20 1932) Resp:  [18-19] 19 (08/20 1932) BP: (145-147)/(78-84) 145/84 (08/20 1932) SpO2:  [93 %-95 %] 93 % (08/20 1932) Weight:  [108.9 kg] 108.9 kg (08/20 1822) Constitutional:  Alert and oriented, No acute distress Cardiovascular: Regular rate and rhythm, No JVD Respiratory: Normal respiratory effort, Lungs clear  bilaterally GI: Abdomen is soft, nontender, nondistended, no abdominal masses GU: No CVA tenderness Lymphatic: No lymphadenopathy Neurologic: Grossly intact, no focal deficits Psychiatric: Normal mood and affect  Laboratory Data:  Recent Labs    02/07/24 1845  WBC 19.5*  HGB 13.5  HCT 41.9  PLT 284    Recent Labs    02/07/24 1845  NA 133*  K 4.2  CL 102  GLUCOSE 215*  BUN 19  CALCIUM  9.3  CREATININE 1.03*     Results for orders placed or performed during the hospital encounter of 02/07/24 (from the past 24 hours)  Comprehensive metabolic panel     Status: Abnormal   Collection Time: 02/07/24  6:45 PM  Result Value Ref Range   Sodium 133 (L) 135 - 145 mmol/L   Potassium 4.2 3.5 - 5.1 mmol/L   Chloride 102 98 - 111 mmol/L   CO2 19 (L) 22 - 32 mmol/L   Glucose, Bld 215 (H) 70 - 99 mg/dL   BUN 19 8 - 23 mg/dL   Creatinine, Ser 8.96 (H) 0.44 - 1.00 mg/dL   Calcium  9.3 8.9 - 10.3 mg/dL   Total Protein 7.8 6.5 - 8.1 g/dL   Albumin 4.0 3.5 - 5.0 g/dL   AST 13 (L) 15 - 41 U/L   ALT 14 0 - 44 U/L   Alkaline Phosphatase 101 38 - 126 U/L   Total Bilirubin 1.1 0.0 - 1.2 mg/dL   GFR, Estimated >39 >39 mL/min   Anion gap 12 5 - 15  CBC with Differential     Status: Abnormal   Collection Time: 02/07/24  6:45 PM  Result Value Ref Range   WBC 19.5 (H) 4.0 - 10.5 K/uL   RBC 4.83 3.87 - 5.11 MIL/uL   Hemoglobin 13.5 12.0 - 15.0 g/dL   HCT 58.0 63.9 - 53.9 %   MCV 86.7 80.0 - 100.0 fL   MCH 28.0 26.0 - 34.0 pg   MCHC 32.2 30.0 -  36.0 g/dL   RDW 86.5 88.4 - 84.4 %   Platelets 284 150 - 400 K/uL   nRBC 0.0 0.0 - 0.2 %   Neutrophils Relative % 87 %   Neutro Abs 17.1 (H) 1.7 - 7.7 K/uL   Lymphocytes Relative 6 %   Lymphs Abs 1.1 0.7 - 4.0 K/uL   Monocytes Relative 6 %   Monocytes Absolute 1.1 (H) 0.1 - 1.0 K/uL   Eosinophils Relative 0 %   Eosinophils Absolute 0.0 0.0 - 0.5 K/uL   Basophils Relative 0 %   Basophils Absolute 0.1 0.0 - 0.1 K/uL   Immature Granulocytes 1  %   Abs Immature Granulocytes 0.17 (H) 0.00 - 0.07 K/uL  Protime-INR     Status: None   Collection Time: 02/07/24  6:45 PM  Result Value Ref Range   Prothrombin Time 14.5 11.4 - 15.2 seconds   INR 1.1 0.8 - 1.2  I-Stat Lactic Acid, ED     Status: None   Collection Time: 02/07/24  6:56 PM  Result Value Ref Range   Lactic Acid, Venous 1.5 0.5 - 1.9 mmol/L  Urinalysis, w/ Reflex to Culture (Infection Suspected) -Urine, Clean Catch     Status: Abnormal   Collection Time: 02/07/24  7:20 PM  Result Value Ref Range   Specimen Source URINE, CATHETERIZED    Color, Urine YELLOW YELLOW   APPearance TURBID (A) CLEAR   Specific Gravity, Urine 1.023 1.005 - 1.030   pH 5.0 5.0 - 8.0   Glucose, UA NEGATIVE NEGATIVE mg/dL   Hgb urine dipstick MODERATE (A) NEGATIVE   Bilirubin Urine NEGATIVE NEGATIVE   Ketones, ur NEGATIVE NEGATIVE mg/dL   Protein, ur 899 (A) NEGATIVE mg/dL   Nitrite POSITIVE (A) NEGATIVE   Leukocytes,Ua LARGE (A) NEGATIVE   RBC / HPF >50 0 - 5 RBC/hpf   WBC, UA >50 0 - 5 WBC/hpf   Bacteria, UA MANY (A) NONE SEEN   Squamous Epithelial / HPF 0-5 0 - 5 /HPF   Mucus PRESENT    Uric Acid Crys, UA PRESENT    No results found for this or any previous visit (from the past 240 hours).  Renal Function: Recent Labs    02/07/24 1845  CREATININE 1.03*   Estimated Creatinine Clearance: 64.7 mL/min (A) (by C-G formula based on SCr of 1.03 mg/dL (H)).  Radiologic Imaging: CT Renal Stone Study Result Date: 02/07/2024 CLINICAL DATA:  Abdominal and flank pain. EXAM: CT ABDOMEN AND PELVIS WITHOUT CONTRAST TECHNIQUE: Multidetector CT imaging of the abdomen and pelvis was performed following the standard protocol without IV contrast. RADIATION DOSE REDUCTION: This exam was performed according to the departmental dose-optimization program which includes automated exposure control, adjustment of the mA and/or kV according to patient size and/or use of iterative reconstruction technique.  COMPARISON:  CT abdomen and pelvis 01/01/2024. FINDINGS: Lower chest: No acute abnormality. Hepatobiliary: No focal liver abnormality is seen. Status post cholecystectomy. No biliary dilatation. Pancreas: Unremarkable. No pancreatic ductal dilatation or surrounding inflammatory changes. Spleen: Normal in size without focal abnormality. Adrenals/Urinary Tract: Left ureteral stent is in place, new from prior. There is mild left-sided hydronephrosis. 3 mm calculus in the proximal left ureter is unchanged in position. There is mild left perinephric stranding, similar to prior. Additional punctate nonobstructing left renal calculi are also stable. Right renal calculi are unchanged from the prior examination including a calculus in the right renal pelvis measuring 9 mm. There is no hydronephrosis. There is a cyst in the  right kidney measuring 17 mm, unchanged from prior. The adrenal glands are within normal limits. Stomach/Bowel: Stomach is within normal limits. Appendix appears normal. No evidence of bowel wall thickening, distention, or inflammatory changes. Vascular/Lymphatic: Aortic atherosclerosis. No enlarged abdominal or pelvic lymph nodes. Reproductive: Status post hysterectomy. No adnexal masses. Other: There is no ascites. There is a small fat containing umbilical hernia. Musculoskeletal: No acute or significant osseous findings. IMPRESSION: 1. New left ureteral stent in place. 2. Stable mild left-sided hydronephrosis. Unchanged 3 mm calculus in the proximal left ureter. 3. Unchanged left perinephric stranding and fluid. Infection not excluded. 4. Additional nonobstructing bilateral renal calculi are unchanged. Aortic Atherosclerosis (ICD10-I70.0). Electronically Signed   By: Greig Pique M.D.   On: 02/07/2024 19:24    I independently reviewed the above imaging studies.  Impression/Recommendation: Jaime Nichols 02/07/2024, 9:01 PM  Jaime CANDIE Renda Mickey. MD   CC: ***

## 2024-02-07 NOTE — ED Triage Notes (Signed)
 Pt had kidney stone blockage 5 weeks ago, has been on 2 rounds of abx and has left stent. Pt was scheduled for surgery tomorrow morning to place another stent and remove stones, but was cancelled because of infection. Fever, chills at home 2 days.  C/o nausea.

## 2024-02-07 NOTE — ED Provider Triage Note (Signed)
 Emergency Medicine Provider Triage Evaluation Note  Jaime Nichols , a 63 y.o. female  was evaluated in triage.  Pt complains of left-sided back pain, nausea that has been progressively worse since yesterday and accompanied with chronic dysuria x 5 weeks.  Notably had kidney stents placed secondary to stone, treated with both Levaquin initially and is currently taking Bactrim  for infected kidney stone.  Stone still in place and was scheduled for surgery tomorrow for stones removal however with patient's current symptoms was told to come to the ER with surgery canceled at that time.  Endorses fever, tachycardia, mild shortness of breath, mild headache.  Denies vision changes, chest pain, abdominal pain, vomiting, hematochezia, melena, hematuria, lower leg swelling.  Review of Systems  Positive: N/a Negative: N/a  Physical Exam  BP (!) 147/78   Pulse (!) 127   Temp (!) 103.1 F (39.5 C) (Oral)   Resp 18   Ht 5' 2.5 (1.588 m)   Wt 108.9 kg   SpO2 95%   BMI 43.20 kg/m  Gen:   Awake, no distress   Resp:  Normal effort  MSK:   Moves extremities without difficulty  Other:    Medical Decision Making  Medically screening exam initiated at 6:36 PM.  Appropriate orders placed.  Jaime Nichols was informed that the remainder of the evaluation will be completed by another provider, this initial triage assessment does not replace that evaluation, and the importance of remaining in the ED until their evaluation is complete.     Beola Terrall RAMAN, [place] 02/07/24 [phone]

## 2024-02-07 NOTE — H&P (Signed)
 History and Physical  Jaime Nichols FMW:[id number] DOB: 22-Dec-1960 DOA: 02/07/2024  PCP: Jaime Harvey, MD   Chief Complaint: Fever, chills, dysuria and nausea  HPI: Jaime Nichols is a 63 y.o. female with medical history significant for CAD, chronic diastolic HF, T2DM, HLD, HTN, morbid obesity, OSA on CPAP, PVCs, bilateral nephrolithiasis s/p recent left ureteral stent placement on 01/01/2024, recurrent UTI currently on Bactrim  and Cipro  now presented to the ED for evaluation of fever, chills, nausea and dysuria.  Patient reports that since having the left ureteral stent was placed 5 weeks ago, she has had persistent urinary tract infection. She completed 10 days of Levaquin without significant improvement.  She had a repeat urine studies and started on Bactrim  over a week ago.  She has continued to have bladder pain and dysuria. Yesterday, she started feeling bad then experienced chills and left flank pain at night. This morning, she had a fever of up to 102 with associated nausea but no vomiting.  She continues to reports bladder pain but denies any abdominal pain, chest pain, dizziness, headache, hematuria or back pain.  ED Course: Initial vitals show temp 103.1, RR 18, HR 127, BP 147/78, SpO2 95% on room air. Initial labs significant for sodium 133, K+ 4.2, bicarb 19, glucose 215, creatinine 1.03, WBC 19.5, Hgb 13.5, platelet 284, PT/INR 14.5/1.1, lactic acid 1.5, UA shows moderate hemoglobinuria, moderate proteinuria, positive nitrite, large leuks, RBC >50, WBC >50 and many bacteria.  CT renal stone study shows stable left ureteral stent, stable mild left-sided hydronephrosis, unchanged 3 mm calculi in the proximal left ureter, unchanged left perinephric stranding and fluid and nonobstructing bilateral renal calculi.  Pt received Tylenol  650 mg x 1, IV NS 2 L bolus and IV cefepime .  Urology was consulted for evaluation. [facility] was consulted for admission.   Review of Systems: Please see  HPI for pertinent positives and negatives. A complete 10 system review of systems are otherwise negative.  Past Medical History:  Diagnosis Date   Aortic atherosclerosis (HCC)    Arthritis    CAD in native artery    very high calcium  score at 875.  coronary CTA  showed 30% RCA and LAD.   Chronic diastolic CHF (congestive heart failure) (HCC) 2012   Diabetes mellitus type 2 in obese    Dyspnea    with exertion   Fatty liver    Fibroid    History of kidney stones 09/2019   HTN (hypertension)    Hyperlipidemia    Morbid obesity (HCC)    OSA on CPAP    PAT (paroxysmal atrial tachycardia) (HCC)    Premature atrial contractions    PVC's (premature ventricular contractions)    Past Surgical History:  Procedure Laterality Date   ABDOMINAL HYSTERECTOMY  06/20/2008   BSO   Benign uterine polyps  01/18/2009   cholwecystectomy      CYSTOSCOPY W/ URETERAL STENT PLACEMENT Right 07/02/2021   Procedure: CYSTOSCOPY WITH RETROGRADE PYELOGRAM/URETERAL STENT PLACEMENT;  Surgeon: Rosalind Zachary NOVAK, MD;  Location: [facility];  Service: Urology;  Laterality: Right;   CYSTOSCOPY/RETROGRADE/URETEROSCOPY Left 01/01/2024   Procedure: CYSTOSCOPY/RETROGRADE/STENT PLACEMENT;  Surgeon: Shane Steffan BROCKS, MD;  Location: [facility];  Service: Urology;  Laterality: Left;  WITH LEFT URETERAL STENT PLACEMENT   CYSTOSCOPY/URETEROSCOPY/HOLMIUM LASER/STENT PLACEMENT Right 07/28/2021   Procedure: RIGHT URETEROSCOPY/ RETROGRADE / HOLMIUM LASER/ STONE BASKETRY, STENT EXCHANGE;  Surgeon: Cam Morene ORN, MD;  Location: [facility];  Service: Urology;  Laterality: Right;   DILATION  AND CURETTAGE OF UTERUS  2010   EXTRACORPOREAL SHOCK WAVE LITHOTRIPSY Left 06/05/2023   Procedure: LEFT EXTRACORPOREAL SHOCK WAVE LITHOTRIPSY (ESWL);  Surgeon: Alvaro Ricardo KATHEE Mickey., MD;  Location[facility];  Service: Urology;  Laterality: Left;   HYSTEROSCOPY  2010   TONSILLECTOMY     URETEROSCOPY WITH HOLMIUM  LASER LITHOTRIPSY Left 2021   Social History:  reports that she has never smoked. She has never used smokeless tobacco. She reports that she does not currently use alcohol. She reports that she does not use drugs.  Allergies  Allergen Reactions   Erythromycin Diarrhea and Nausea And Vomiting    Other reaction(s): stomach upset   Latex Other (See Comments)    REACTION: CONTACT DERMATITIS   Nickel Other (See Comments)    Poor healing- dental reasons   Statins Other (See Comments)    Joint pain   Crestor  [Rosuvastatin ] Other (See Comments)    Cannot take when taking Colchicine   Dapagliflozin     Other reaction(s): worsening of urine incontinence   Hydrochlorothiazide     Previously discontinued due to uric acid kidney stones   Metformin  Hcl Diarrhea and Other (See Comments)    Diarrhea at higher doses, GI Upset   Potassium Citrate  Other (See Comments)    Affected eyes- redness and crusty at 30 mEq/daily; pt states she has no reaction to the potassium citrate  that she is taking now and that this only happened when she was taking too much   Zocor [Simvastatin] Other (See Comments)    Joint pain    Family History  Problem Relation Age of Onset   Hypertension Mother    Heart disease Father        Died age 34s of a cardiac event - was told it was atherosclerosis but also had PVCs   Healthy Sister    Arrhythmia Sister        PVCs   Depression Brother    Healthy Sister    Diabetes Paternal Grandmother    Arrhythmia Other        Distant relative with WPW.     Prior to Admission medications   Medication Sig Start Date End Date Taking? Authorizing Provider  allopurinol  (ZYLOPRIM ) 300 MG tablet Take 300 mg by mouth daily. 09/20/21   [provider]  colchicine 0.6 MG tablet Take 0.6 mg by mouth daily as needed (gout flares). 09/07/21   [provider]  D 1000 25 MCG (1000 UT) capsule Take 1,000 Units by mouth 3 (three) times a week. 09/07/21   [provider]  diltiazem  (CARDIZEM  CD) 120 MG 24 hr capsule TAKE 1 CAPSULE BY MOUTH EVERY DAY 08/15/23   Shlomo Wilbert SAUNDERS, MD  estradiol  (ESTRACE ) 0.1 MG/GM vaginal cream Place 1 Applicatorful vaginally 3 (three) times a week. 11/24/20   [provider]  Evolocumab  (REPATHA  SURECLICK) 140 MG/ML SOAJ INJECT 1 PEN INTO THE SKIN EVERY 14 (FOURTEEN) DAYS. 06/23/23   Shlomo Wilbert SAUNDERS, MD  furosemide  (LASIX ) 20 MG tablet Take 1 tablet (20 mg total) by mouth daily. Patient not taking: Reported on 01/25/2024 12/15/23   Dunn, Dayna N, PA-C  hyoscyamine  (ANASPAZ ) 0.125 MG TBDP disintergrating tablet Place 1 tablet (0.125 mg total) under the tongue every 6 (six) hours as needed for up to 20 doses. 01/01/24   Shane Steffan BROCKS, MD  levalbuterol  (XOPENEX  HFA) 45 MCG/ACT inhaler Inhale 1-2 puffs into the lungs every 4 (four) hours as needed for shortness of breath.  09/27/18   [provider]  lisinopril  (ZESTRIL ) 10 MG tablet Take 1 tablet (10 mg total) by mouth daily. 05/04/21   Shlomo Wilbert SAUNDERS, MD  meloxicam (MOBIC) 15 MG tablet Take 15 mg by mouth daily. 01/10/24   [provider]  metFORMIN  (GLUCOPHAGE -XR) 500 MG 24 hr tablet Take 500 mg by mouth daily with breakfast.    [provider]  metoprolol  succinate (TOPROL -XL) 25 MG 24 hr tablet TAKE 1 TABLET (25 MG TOTAL) BY MOUTH DAILY. 05/16/22   Shlomo Wilbert SAUNDERS, MD  phenazopyridine  (PYRIDIUM ) 200 MG tablet Take 1 tablet (200 mg total) by mouth 3 (three) times daily as needed for up to 6 doses. Patient not taking: Reported on 01/25/2024 01/01/24   Shane Steffan BROCKS, MD  Potassium Citrate  15 MEQ (1620 MG) TBCR Take 15 mEq by mouth 3 (three) times a week. 06/04/21   [provider]  PRESCRIPTION MEDICATION CPAP- At bedtime    [provider]  sulfamethoxazole -trimethoprim  (BACTRIM  DS) 800-160 MG tablet Take 1 tablet by mouth 2 (two) times daily. Last dose on DOS which will be day 14.    [provider]  tamsulosin  (FLOMAX ) 0.4  MG CAPS capsule Take 1 capsule (0.4 mg total) by mouth daily. Patient not taking: Reported on 01/25/2024 06/02/23   Geroldine Berg, MD  tamsulosin  (FLOMAX ) 0.4 MG CAPS capsule Take 1 capsule (0.4 mg total) by mouth daily after supper. 01/01/24   Shane Steffan BROCKS, MD  terbinafine  (LAMISIL ) 250 MG tablet Take 1 tablet (250 mg total) by mouth daily. Patient not taking: Reported on 01/25/2024 04/13/23   Magdalen Pasco RAMAN, DPM  tirzepatide (MOUNJARO) 15 MG/0.5ML Pen Inject 15 mg into the skin every Monday. 04/12/22   [provider]  tiZANidine  (ZANAFLEX ) 4 MG tablet Take 0.5 tablets (2 mg total) by mouth every 6 (six) hours as needed for muscle spasms. Patient not taking: Reported on 01/25/2024 07/28/21   Cam Morene ORN, MD    Physical Exam: BP (!) 142/72   Pulse (!) 106   Temp 99.1 F (37.3 C)   Resp 17   Ht 5' 2.5 (1.588 m)   Wt 108.9 kg   SpO2 94%   BMI 43.20 kg/m  General: Pleasant, well-appearing obese woman laying in bed. No acute distress. HEENT: [place]/AT. Anicteric sclera CV: RRR. No murmurs, rubs, or gallops. No LE edema Pulmonary: Lungs CTAB. Normal effort. No wheezing or rales. Decreased breath sounds at the bases. Abdominal: Soft, nontender, nondistended. Normal bowel sounds. No CVA tenderness. Extremities: Palpable radial and DP pulses. Normal ROM. Skin: Warm and dry. No obvious rash or lesions. Neuro: A&Ox3. Moves all extremities. Normal sensation to light touch. No focal deficit. Psych: Normal mood and affect          Labs on Admission:  Basic Metabolic Panel: Recent Labs  Lab 02/07/24 1845  NA 133*  K 4.2  CL 102  CO2 19*  GLUCOSE 215*  BUN 19  CREATININE 1.03*  CALCIUM  9.3   Liver Function Tests: Recent Labs  Lab 02/07/24 1845  AST 13*  ALT 14  ALKPHOS 101  BILITOT 1.1  PROT 7.8  ALBUMIN 4.0   No results for input(s): LIPASE, AMYLASE in the last 168 hours. No results for input(s): AMMONIA in the last 168 hours. CBC: Recent Labs  Lab  02/07/24 1845  WBC 19.5*  NEUTROABS 17.1*  HGB 13.5  HCT 41.9  MCV 86.7  PLT 284   Cardiac Enzymes: No results for input(s): CKTOTAL,  CKMB, CKMBINDEX, TROPONINI in the last 168 hours. BNP (last 3 results) Recent Labs    12/13/23 1017  BNP 12.2    ProBNP (last 3 results) No results for input(s): PROBNP in the last 8760 hours.  CBG: No results for input(s): GLUCAP in the last 168 hours.  Radiological Exams on Admission: CT Renal Stone Study Result Date: 02/07/2024 CLINICAL DATA:  Abdominal and flank pain. EXAM: CT ABDOMEN AND PELVIS WITHOUT CONTRAST TECHNIQUE: Multidetector CT imaging of the abdomen and pelvis was performed following the standard protocol without IV contrast. RADIATION DOSE REDUCTION: This exam was performed according to the departmental dose-optimization program which includes automated exposure control, adjustment of the mA and/or kV according to patient size and/or use of iterative reconstruction technique. COMPARISON:  CT abdomen and pelvis 01/01/2024. FINDINGS: Lower chest: No acute abnormality. Hepatobiliary: No focal liver abnormality is seen. Status post cholecystectomy. No biliary dilatation. Pancreas: Unremarkable. No pancreatic ductal dilatation or surrounding inflammatory changes. Spleen: Normal in size without focal abnormality. Adrenals/Urinary Tract: Left ureteral stent is in place, new from prior. There is mild left-sided hydronephrosis. 3 mm calculus in the proximal left ureter is unchanged in position. There is mild left perinephric stranding, similar to prior. Additional punctate nonobstructing left renal calculi are also stable. Right renal calculi are unchanged from the prior examination including a calculus in the right renal pelvis measuring 9 mm. There is no hydronephrosis. There is a cyst in the right kidney measuring 17 mm, unchanged from prior. The adrenal glands are within normal limits. Stomach/Bowel: Stomach is within normal limits.  Appendix appears normal. No evidence of bowel wall thickening, distention, or inflammatory changes. Vascular/Lymphatic: Aortic atherosclerosis. No enlarged abdominal or pelvic lymph nodes. Reproductive: Status post hysterectomy. No adnexal masses. Other: There is no ascites. There is a small fat containing umbilical hernia. Musculoskeletal: No acute or significant osseous findings. IMPRESSION: 1. New left ureteral stent in place. 2. Stable mild left-sided hydronephrosis. Unchanged 3 mm calculus in the proximal left ureter. 3. Unchanged left perinephric stranding and fluid. Infection not excluded. 4. Additional nonobstructing bilateral renal calculi are unchanged. Aortic Atherosclerosis (ICD10-I70.0). Electronically Signed   By: Greig Pique M.D.   On: 02/07/2024 19:24   Assessment/Plan Winnell Bento Wildermuth is a 64 y.o. female with medical history significant for AD, chronic diastolic HF, T2DM, HLD, HTN, morbid obesity, OSA on CPAP, PVCs, bilateral nephrolithiasis s/p recent left ureteral stent placement on 01/01/2024, recurrent UTI currently on Bactrim  and Cipro  now presented to the ED for evaluation of fever, chills, nausea and dysuria and admitted for sepsis secondary to UTI.   # Sepsis # Acute cystitis - Pt w/ recent ureteral stent placement presented with with persistent bladder pain with acute onset of fever, chills and nausea - UA shows signs of infection, CT renal stone study shows persistent left perinephric stranding and fluid - Met sepsis criteria with fever, tachycardia, leukocytosis and evidence of urinary infection - She has failed multiple outpatient antibiotics including Bactrim  and Levaquin - Ucx on 7/14 shows ESBL producing Klebsiella and ucx from 8/8 shows MDR Klebsiella (See media tab) - Start IV meropenem  and de-escalate antibiotic based on urine culture - Start IV hydration with IVNS @ 100 cc/hr - Follow-up blood and urine cultures - Trend CBC, fever curve  # Bilateral  nephrolithiasis - Underwent left ureteral stent placement for obstructing infected stone on 01/01/2024 - CT renal stone study does not show any significant changes to her left ureteral stent or the  nonobstructive renal calculi -  Urology consulted, appreciate recs - Tentatively scheduled for cystoscopy with retrograde pyelogram tomorrow - Continue tamsulosin   # T2DM - Last A1c 7.4% 1 week ago, blood sugar 215 on CMP - Home regimen includes metformin  and mounjaro, can resume both at discharge - SSI with meals, CBG monitoring  # HFpEF - Last TTE on 01/15/2024 showed EF 55-65%, G2DD and no valvular abnormalities - Patient euvolemic on exam - Continue Toprol  XL, lisinopril  and potassium supplementation  # HTN - BP slightly elevated with SBP in the 140s to 150s - Reports she did not take any of her meds today due to persistent nausea - Continue Toprol  XL, diltiazem  and lisinopril   # Gout - Continue allopurinol   # OSA - CPAP at bedtime  # Class III obesity Body mass index is 43.2 kg/m. Filed Weights   02/07/24 1822  Weight: 108.9 kg  - Continue mounjaro in the outpatient  DVT prophylaxis: Lovenox      Code Status: Full Code  Consults called: Urology  Family Communication: Discussed admission with spouse at bedside  Severity of Illness: The appropriate patient status for this patient is INPATIENT. Inpatient status is judged to be reasonable and necessary in order to provide the required intensity of service to ensure the patient's safety. The patient's presenting symptoms, physical exam findings, and initial radiographic and laboratory data in the context of their chronic comorbidities is felt to place them at high risk for further clinical deterioration. Furthermore, it is not anticipated that the patient will be medically stable for discharge from the hospital within 2 midnights of admission.   * I certify that at the point of admission it is my clinical judgment that the  patient will require inpatient hospital care spanning beyond 2 midnights from the point of admission due to high intensity of service, high risk for further deterioration and high frequency of surveillance required.*  Level of care: Progressive   This record has been created using Conservation officer, historic buildings. Errors have been sought and corrected, but may not always be located. Such creation errors do not reflect on the standard of care.   Lou Claretta HERO, MD 02/07/2024, 10:26 PM [facility] Pager: [phone] Isaiah 41:10   If 7PM-7AM, please contact night-coverage [email or web address] Password [email or web address]

## 2024-02-07 NOTE — ED Provider Notes (Signed)
 [place] EMERGENCY DEPARTMENT AT [facility] Provider Note   CSN: [id number] Arrival date & time: 02/07/24  1729     Patient presents with: Code Sepsis   Jaime Nichols is a 63 y.o. female here with complaint of fever, chills, nausea.  Patient reports that she had a stent placed about 5 weeks ago for her ureteral stone.  She says she has a history of multidrug-resistant infections and bladder infections and has had sepsis twice in the past.  She says she is currently taking Levaquin as well as Bactrim , due to intermediate resistance to Levaquin or culture sensitivity.  She reports that she began to feel very ill last night and came in today with fevers and chills.  She has been taking her medications at home.  She was scheduled to have her stent removed tomorrow, but this was postponed due to her fever   HPI     Prior to Admission medications   Medication Sig Start Date End Date Taking? Authorizing Provider  allopurinol  (ZYLOPRIM ) 300 MG tablet Take 300 mg by mouth daily. 09/20/21  Yes [provider]  colchicine 0.6 MG tablet Take 0.6 mg by mouth daily as needed (gout flares). 09/07/21  Yes [provider]  diltiazem  (CARDIZEM  CD) 120 MG 24 hr capsule TAKE 1 CAPSULE BY MOUTH EVERY DAY 08/15/23  Yes Turner, Wilbert SAUNDERS, MD  Evolocumab  (REPATHA  SURECLICK) 140 MG/ML SOAJ INJECT 1 PEN INTO THE SKIN EVERY 14 (FOURTEEN) DAYS. 06/23/23  Yes Turner, Wilbert SAUNDERS, MD  levalbuterol  (XOPENEX  HFA) 45 MCG/ACT inhaler Inhale 1-2 puffs into the lungs every 4 (four) hours as needed for shortness of breath. 09/27/18  Yes [provider]  lisinopril  (ZESTRIL ) 10 MG tablet Take 1 tablet (10 mg total) by mouth daily. 05/04/21  Yes Turner, Wilbert SAUNDERS, MD  metFORMIN  (GLUCOPHAGE -XR) 500 MG 24 hr tablet Take 500 mg by mouth daily with breakfast.   Yes [provider]  metoprolol  succinate (TOPROL -XL) 25 MG 24 hr tablet TAKE 1 TABLET (25 MG TOTAL) BY MOUTH DAILY. 05/16/22  Yes  Turner, Wilbert SAUNDERS, MD  Potassium Citrate  15 MEQ (1620 MG) TBCR Take 15 mEq by mouth 3 (three) times a week. Monday, Wednesday. Friday 06/04/21  Yes [provider]  PRESCRIPTION MEDICATION CPAP- At bedtime   Yes [provider]  tamsulosin  (FLOMAX ) 0.4 MG CAPS capsule Take 1 capsule (0.4 mg total) by mouth daily after supper. Patient taking differently: Take 0.4 mg by mouth as needed. 01/01/24  Yes Shane Steffan BROCKS, MD  tirzepatide (MOUNJARO) 15 MG/0.5ML Pen Inject 15 mg into the skin every Monday. 04/12/22  Yes [provider]  tiZANidine  (ZANAFLEX ) 4 MG tablet Take 0.5 tablets (2 mg total) by mouth every 6 (six) hours as needed for muscle spasms. 07/28/21  Yes Cam Morene ORN, MD  D 1000 25 MCG (1000 UT) capsule Take 1,000 Units by mouth 3 (three) times a week. Patient not taking: Reported on 02/07/2024 09/07/21   [provider]  estradiol  (ESTRACE ) 0.1 MG/GM vaginal cream Place 1 Applicatorful vaginally 3 (three) times a week. 11/24/20   [provider]  hyoscyamine  (ANASPAZ ) 0.125 MG TBDP disintergrating tablet Place 1 tablet (0.125 mg total) under the tongue every 6 (six) hours as needed for up to 20 doses. 01/01/24   Shane Steffan BROCKS, MD    Allergies: Erythromycin, Latex, Nickel, Statins, Crestor  [rosuvastatin ], Dapagliflozin, Hydrochlorothiazide, Metformin  hcl, Potassium citrate , and Zocor [simvastatin]    Review of Systems  Updated Vital Signs  BP 121/74   Pulse 92   Temp 97.9 F (36.6 C) (Oral)   Resp 14   Ht 5' 2.5 (1.588 m)   Wt 108.9 kg   SpO2 97%   BMI 43.20 kg/m   Physical Exam Constitutional:      General: She is not in acute distress. HENT:     Head: Normocephalic and atraumatic.  Eyes:     Conjunctiva/sclera: Conjunctivae normal.     Pupils: Pupils are equal, round, and reactive to light.  Cardiovascular:     Rate and Rhythm: Regular rhythm. Tachycardia present.  Pulmonary:     Effort: Pulmonary effort is normal.  No respiratory distress.  Abdominal:     General: There is no distension.     Tenderness: There is no abdominal tenderness.  Skin:    General: Skin is warm and dry.  Neurological:     General: No focal deficit present.     Mental Status: She is alert. Mental status is at baseline.  Psychiatric:        Mood and Affect: Mood normal.        Behavior: Behavior normal.     (all labs ordered are listed, but only abnormal results are displayed) Labs Reviewed  COMPREHENSIVE METABOLIC PANEL WITH GFR - Abnormal; Notable for the following components:      Result Value   Sodium 133 (*)    CO2 19 (*)    Glucose, Bld 215 (*)    Creatinine, Ser 1.03 (*)    AST 13 (*)    All other components within normal limits  CBC WITH DIFFERENTIAL/PLATELET - Abnormal; Notable for the following components:   WBC 19.5 (*)    Neutro Abs 17.1 (*)    Monocytes Absolute 1.1 (*)    Abs Immature Granulocytes 0.17 (*)    All other components within normal limits  URINALYSIS, W/ REFLEX TO CULTURE (INFECTION SUSPECTED) - Abnormal; Notable for the following components:   APPearance TURBID (*)    Hgb urine dipstick MODERATE (*)    Protein, ur 100 (*)    Nitrite POSITIVE (*)    Leukocytes,Ua LARGE (*)    Bacteria, UA MANY (*)    All other components within normal limits  CBG MONITORING, ED - Abnormal; Notable for the following components:   Glucose-Capillary 197 (*)    All other components within normal limits  CULTURE, BLOOD (ROUTINE X 2)  CULTURE, BLOOD (ROUTINE X 2)  URINE CULTURE  PROTIME-INR  HIV ANTIBODY (ROUTINE TESTING W REFLEX)  BASIC METABOLIC PANEL WITH GFR  CBC  I-STAT CG4 LACTIC ACID, ED    EKG: None  Radiology: CT Renal Stone Study Result Date: 02/07/2024 CLINICAL DATA:  Abdominal and flank pain. EXAM: CT ABDOMEN AND PELVIS WITHOUT CONTRAST TECHNIQUE: Multidetector CT imaging of the abdomen and pelvis was performed following the standard protocol without IV contrast. RADIATION DOSE  REDUCTION: This exam was performed according to the departmental dose-optimization program which includes automated exposure control, adjustment of the mA and/or kV according to patient size and/or use of iterative reconstruction technique. COMPARISON:  CT abdomen and pelvis 01/01/2024. FINDINGS: Lower chest: No acute abnormality. Hepatobiliary: No focal liver abnormality is seen. Status post cholecystectomy. No biliary dilatation. Pancreas: Unremarkable. No pancreatic ductal dilatation or surrounding inflammatory changes. Spleen: Normal in size without focal abnormality. Adrenals/Urinary Tract: Left ureteral stent is in place, new from prior. There is mild left-sided hydronephrosis. 3 mm calculus in the proximal left ureter is unchanged in position. There is  mild left perinephric stranding, similar to prior. Additional punctate nonobstructing left renal calculi are also stable. Right renal calculi are unchanged from the prior examination including a calculus in the right renal pelvis measuring 9 mm. There is no hydronephrosis. There is a cyst in the right kidney measuring 17 mm, unchanged from prior. The adrenal glands are within normal limits. Stomach/Bowel: Stomach is within normal limits. Appendix appears normal. No evidence of bowel wall thickening, distention, or inflammatory changes. Vascular/Lymphatic: Aortic atherosclerosis. No enlarged abdominal or pelvic lymph nodes. Reproductive: Status post hysterectomy. No adnexal masses. Other: There is no ascites. There is a small fat containing umbilical hernia. Musculoskeletal: No acute or significant osseous findings. IMPRESSION: 1. New left ureteral stent in place. 2. Stable mild left-sided hydronephrosis. Unchanged 3 mm calculus in the proximal left ureter. 3. Unchanged left perinephric stranding and fluid. Infection not excluded. 4. Additional nonobstructing bilateral renal calculi are unchanged. Aortic Atherosclerosis (ICD10-I70.0). Electronically Signed   By:  Greig Pique M.D.   On: 02/07/2024 19:24     .Critical Care  Performed by: Cottie Donnice PARAS, MD Authorized by: Cottie Donnice PARAS, MD   Critical care provider statement:    Critical care time (minutes):  30   Critical care time was exclusive of:  Separately billable procedures and treating other patients   Critical care was necessary to treat or prevent imminent or life-threatening deterioration of the following conditions:  Sepsis   Critical care was time spent personally by me on the following activities:  Ordering and performing treatments and interventions, ordering and review of laboratory studies, ordering and review of radiographic studies, pulse oximetry, review of old charts, examination of patient and evaluation of patient's response to treatment    Medications Ordered in the ED  acetaminophen  (TYLENOL ) tablet 650 mg (has no administration in time range)    Or  acetaminophen  (TYLENOL ) suppository 650 mg (has no administration in time range)  senna-docusate (Senokot-S) tablet 1 tablet (has no administration in time range)  bisacodyl  (DULCOLAX) EC tablet 5 mg (has no administration in time range)  ondansetron  (ZOFRAN ) tablet 4 mg (has no administration in time range)    Or  ondansetron  (ZOFRAN ) injection 4 mg (has no administration in time range)  enoxaparin  (LOVENOX ) injection 40 mg (has no administration in time range)  insulin  aspart (novoLOG ) injection 0-15 Units (has no administration in time range)  insulin  aspart (novoLOG ) injection 0-5 Units ( Subcutaneous Not Given 02/07/24 2321)  allopurinol  (ZYLOPRIM ) tablet 300 mg (has no administration in time range)  diltiazem  (CARDIZEM  CD) 24 hr capsule 120 mg (has no administration in time range)  lisinopril  (ZESTRIL ) tablet 10 mg (has no administration in time range)  metoprolol  succinate (TOPROL -XL) 24 hr tablet 25 mg (has no administration in time range)  Potassium Citrate  TBCR 1 tablet (has no administration in time range)   tamsulosin  (FLOMAX ) capsule 0.4 mg (has no administration in time range)  0.9 %  sodium chloride  infusion (has no administration in time range)  meropenem  (MERREM ) 1 g in sodium chloride  0.9 % 100 mL IVPB (has no administration in time range)  acetaminophen  (TYLENOL ) tablet 650 mg (650 mg Oral Given 02/07/24 1828)  ceFEPIme  (MAXIPIME ) 2 g in sodium chloride  0.9 % 100 mL IVPB (0 g Intravenous Stopped 02/07/24 2127)  sodium chloride  0.9 % bolus 2,000 mL (2,000 mLs Intravenous New Bag/Given 02/07/24 2220)    Clinical Course as of 02/07/24 2337  Wed Feb 07, 2024  2012 I am attempting to solicit culture  sensitivities to tailor her antibiotics as Urology culture results are not visible on epic.  [MT]  2049 8/11 urine culture sensitive to augmentin , ancef , cefepime , rocephin , ertapenem, gentamicin  [MT]  2050 Dr Mitchell urology reports no emergent OR needed - okay for medical admission on antibiotics [MT]  2116 Admitted to hospitalist [MT]    Clinical Course User Index [MT] Marlie Kuennen, Donnice PARAS, MD                                 Medical Decision Making Amount and/or Complexity of Data Reviewed Labs: ordered.  Risk OTC drugs. Decision regarding hospitalization.   This patient presents to the ED with concern for fevers, nausea, flank pain. This involves an extensive number of treatment options, and is a complaint that carries with it a high risk of complications and morbidity.  The differential diagnosis includes UTI versus viral infection versus other intra-abdominal process  Co-morbidities that complicate the patient evaluation: History of recent ureteral stenting, instrumentation, risk of infection  External records from outside source obtained and reviewed including urology records  I ordered and personally interpreted labs.  The pertinent results include: UA consistent with infection.  White blood cell count elevated.  Lactate within normal limits  I ordered imaging studies including CT  renal stone study I independently visualized and interpreted imaging which showed persistent 3 mm stone in the left ureteral stent, persistent left perinephric stranding which may be infectious versus limited or I agree with the radiologist interpretation  The patient was maintained on a cardiac monitor.  I personally viewed and interpreted the cardiac monitored which showed an underlying rhythm of: Sinus tachycardia improved to sinus rhythm  I ordered medication including IV antibiotics, broad-spectrum based on culture sensitivities, 2 L fluid per sepsis workup.  I have reviewed the patients home medicines and have made adjustments as needed  Test Considered: No indication for angiogram at this time  I requested consultation with the urology,  and discussed lab and imaging findings as well as pertinent plan - they recommend: See ED course.  Urologist was able to confirm most recent urine culture sensitivities from 7 days ago, and cephalosporins and cefepime  would be a reasonable option for treatment here.  After the interventions noted above, I reevaluated the patient and found that they have: improved -patient's heart rate improved   Disposition:  After consideration of the diagnostic results and the patient's response to treatment, I feel that the patent would benefit from medical admission      Final diagnoses:  Sepsis, due to unspecified organism, unspecified whether acute organ dysfunction present [facility])    ED Discharge Orders     None          Cottie Donnice PARAS, MD 02/07/24 2339

## 2024-02-08 ENCOUNTER — Encounter (HOSPITAL_COMMUNITY): Payer: Self-pay

## 2024-02-08 ENCOUNTER — Encounter (HOSPITAL_COMMUNITY)
Admission: EM | Disposition: A | Discharge status: Home Health | Payer: Self-pay | Source: Home / Self Care | Attending: Internal Medicine

## 2024-02-08 ENCOUNTER — Ambulatory Visit (HOSPITAL_COMMUNITY): Admission: RE | Admit: 2024-02-08 | Source: Ambulatory Visit | Admitting: Urology

## 2024-02-08 ENCOUNTER — Encounter (HOSPITAL_COMMUNITY): Payer: Self-pay | Admitting: Medical

## 2024-02-08 DIAGNOSIS — N390 Urinary tract infection, site not specified: Secondary | ICD-10-CM | POA: Diagnosis not present

## 2024-02-08 DIAGNOSIS — A419 Sepsis, unspecified organism: Secondary | ICD-10-CM | POA: Diagnosis not present

## 2024-02-08 LAB — BASIC METABOLIC PANEL WITH GFR
Anion gap: 9 (ref 5–15)
BUN: 18 mg/dL (ref 8–23)
CO2: 21 mmol/L — ABNORMAL LOW (ref 22–32)
Calcium: 8.7 mg/dL — ABNORMAL LOW (ref 8.9–10.3)
Chloride: 104 mmol/L (ref 98–111)
Creatinine, Ser: 0.9 mg/dL (ref 0.44–1.00)
GFR, Estimated: >60 mL/min (ref >60)
Glucose, Bld: 232 mg/dL — ABNORMAL HIGH (ref 70–99)
Potassium: 4.3 mmol/L (ref 3.5–5.1)
Sodium: 134 mmol/L — ABNORMAL LOW (ref 135–145)

## 2024-02-08 LAB — CBC
HCT: 36.1 % (ref 36.0–46.0)
Hemoglobin: 11.4 g/dL — ABNORMAL LOW (ref 12.0–15.0)
MCH: 27.8 pg (ref 26.0–34.0)
MCHC: 31.6 g/dL (ref 30.0–36.0)
MCV: 88 fL (ref 80.0–100.0)
Platelets: 232 K/uL (ref 150–400)
RBC: 4.1 MIL/uL (ref 3.87–5.11)
RDW: 13.5 % (ref 11.5–15.5)
WBC: 16.4 K/uL — ABNORMAL HIGH (ref 4.0–10.5)
nRBC: 0 % (ref 0.0–0.2)

## 2024-02-08 LAB — GLUCOSE, CAPILLARY
Glucose-Capillary: 196 mg/dL — ABNORMAL HIGH (ref 70–99)
Glucose-Capillary: 152 mg/dL — ABNORMAL HIGH (ref 70–99)

## 2024-02-08 LAB — CBG MONITORING, ED: Glucose-Capillary: 183 mg/dL — ABNORMAL HIGH (ref 70–99)

## 2024-02-08 LAB — HIV ANTIBODY (ROUTINE TESTING W REFLEX): HIV Screen 4th Generation wRfx: NONREACTIVE

## 2024-02-08 SURGERY — CYSTOSCOPY/URETEROSCOPY/HOLMIUM LASER/STENT PLACEMENT
Anesthesia: General | Laterality: Left

## 2024-02-08 MED ORDER — CHLORHEXIDINE GLUCONATE 0.12 % MT SOLN
15.0000 mL | Freq: Once | OROMUCOSAL | Status: AC
  Administered 2024-02-08: 15 mL via OROMUCOSAL
  Filled 2024-02-08: qty 15

## 2024-02-08 MED ORDER — ORAL CARE MOUTH RINSE: 15.0000 mL | Freq: Once | OROMUCOSAL | Status: AC

## 2024-02-08 MED ORDER — ENSURE PLUS HIGH PROTEIN PO LIQD: 237.0000 mL | Freq: Two times a day (BID) | ORAL | Status: DC

## 2024-02-08 MED ORDER — ACETAMINOPHEN 325 MG PO TABS
650.0000 mg | ORAL_TABLET | ORAL | Status: DC | PRN
  Administered 2024-02-08 – 2024-02-14 (×7): 650 mg via ORAL
  Filled 2024-02-08 (×8): qty 2

## 2024-02-08 MED ORDER — ACETAMINOPHEN 650 MG RE SUPP: 650.0000 mg | RECTAL | Status: DC | PRN

## 2024-02-08 MED ORDER — LACTATED RINGERS IV SOLN: INTRAVENOUS | Status: AC

## 2024-02-08 NOTE — Progress Notes (Signed)
 Subjective: Patient still tachycardic, feeling a little better, still having L flank pain, nausea has improved.   Objective: Vital signs in last 24 hours: Temp:  [97.9 F (36.6 C)-103.1 F (39.5 C)] 99.3 F (37.4 C) (08/21 0430) Pulse Rate:  [92-127] 107 (08/21 0545) Resp:  [14-34] 25 (08/21 0545) BP: (117-162)/(59-84) 123/61 (08/21 0545) SpO2:  [93 %-99 %] 94 % (08/21 0545) Weight:  [108.9 kg] 108.9 kg (08/20 1822)  Intake/Output from previous day: 08/20 0701 - 08/21 0700 In: 101.2 [IV Piggyback:101.2] Out: -  Intake/Output this shift: No intake/output data recorded.  Physical Exam:  General: Alert and oriented CV: Tachycardic  Lungs: Clear GU: no foley   Lab Results: Recent Labs    02/07/24 1845 02/08/24 0418  HGB 13.5 11.4*  HCT 41.9 36.1   BMET Recent Labs    02/07/24 1845 02/08/24 0418  NA 133* 134*  K 4.2 4.3  CL 102 104  CO2 19* 21*  GLUCOSE 215* 232*  BUN 19 18  CREATININE 1.03* 0.90  CALCIUM  9.3 8.7*     Studies/Results: CT Renal Stone Study Result Date: 02/07/2024 CLINICAL DATA:  Abdominal and flank pain. EXAM: CT ABDOMEN AND PELVIS WITHOUT CONTRAST TECHNIQUE: Multidetector CT imaging of the abdomen and pelvis was performed following the standard protocol without IV contrast. RADIATION DOSE REDUCTION: This exam was performed according to the departmental dose-optimization program which includes automated exposure control, adjustment of the mA and/or kV according to patient size and/or use of iterative reconstruction technique. COMPARISON:  CT abdomen and pelvis 01/01/2024. FINDINGS: Lower chest: No acute abnormality. Hepatobiliary: No focal liver abnormality is seen. Status post cholecystectomy. No biliary dilatation. Pancreas: Unremarkable. No pancreatic ductal dilatation or surrounding inflammatory changes. Spleen: Normal in size without focal abnormality. Adrenals/Urinary Tract: Left ureteral stent is in place, new from prior. There is mild  left-sided hydronephrosis. 3 mm calculus in the proximal left ureter is unchanged in position. There is mild left perinephric stranding, similar to prior. Additional punctate nonobstructing left renal calculi are also stable. Right renal calculi are unchanged from the prior examination including a calculus in the right renal pelvis measuring 9 mm. There is no hydronephrosis. There is a cyst in the right kidney measuring 17 mm, unchanged from prior. The adrenal glands are within normal limits. Stomach/Bowel: Stomach is within normal limits. Appendix appears normal. No evidence of bowel wall thickening, distention, or inflammatory changes. Vascular/Lymphatic: Aortic atherosclerosis. No enlarged abdominal or pelvic lymph nodes. Reproductive: Status post hysterectomy. No adnexal masses. Other: There is no ascites. There is a small fat containing umbilical hernia. Musculoskeletal: No acute or significant osseous findings. IMPRESSION: 1. New left ureteral stent in place. 2. Stable mild left-sided hydronephrosis. Unchanged 3 mm calculus in the proximal left ureter. 3. Unchanged left perinephric stranding and fluid. Infection not excluded. 4. Additional nonobstructing bilateral renal calculi are unchanged. Aortic Atherosclerosis (ICD10-I70.0). Electronically Signed   By: Greig Pique M.D.   On: 02/07/2024 19:24    Assessment/Plan: 7 F w/ pyelonephritis in setting of left ureteral stent. Admitted to medicine. Right sided non obstructing stone.   # Pyelonephritis  - left stent in place  - no intervention from urology warranted - continue brad spectrum abx (cefepime  and meropenem ) - blood and urine cultures pending   # Bilateral stones - planned of ureteroscopy 8/21- this was cancelled - will need to reschedule   Urology to continue to follow     LOS: 1 day   Jackey Pea MD 02/08/2024, 7:16 AM [facility]

## 2024-02-08 NOTE — Progress Notes (Signed)
  Progress Note   Patient: Jaime Nichols FMW:[id number] DOB: 08-Jul-1960 DOA: 02/07/2024     1 DOS: the patient was seen and examined on 02/08/2024   Brief hospital course: 3 F w/ pyelonephritis in setting of left ureteral stent.  Right sided non obstructing stone. Pt was planned for ureteroscopy on 8/21 but this was cancelled by urology due to the pt's current hospital admission for sepsis/pyelonephritis.   Assessment and Plan: 1.Sepsis due to UTI,  L pyelonephritis/hydronephrosis w/ L urteteral stent in place - IV NS 100 cc/hr  - IV meropenem  1 g q8hr  - Flomax  0.4 mg PO daily  - Tylenol  PRN  - Potassium citrate  TBCR 1 tab PO 3 times weekly (MWF)  2. DM2 - Novolog  SS ACHS   3. HFpEF  - Currently euvolemic; monitor   4. HTN - Cardizem  CD 120 mg PO daily  - Toprol  XL 25 mg PO daily  - Lisinopril  10 mg PO daily   5. Gout  - Allopurinol  300 mg PO daily   6. OSA - CPAP at bedtime  7. Class III obesity  - BMI 43.20 kg/m2  Subjective: Pt seen and examined at the bedside. WBC downtrending 19 -->16. She remains in the hospital for IV fluids and IV antibx. F/u blood cx. Urology has cancelled the pt's surgery which was planned previously for today due to her sepsis.  Physical Exam: Vitals:   02/08/24 0315 02/08/24 0430 02/08/24 0545 02/08/24 0836  BP: (!) 134/59 119/70 123/61 (!) 124/53  Pulse: (!) 125 (!) 117 (!) 107 (!) 116  Resp: (!) 34 20 (!) 25 (!) 22  Temp:  99.3 F (37.4 C)  99.1 F (37.3 C)  TempSrc:  Oral  Oral  SpO2: 94% 94% 94% 97%  Weight:      Height:       Physical Exam HENT:     Head: Normocephalic.     Mouth/Throat:     Mouth: Mucous membranes are moist.  Cardiovascular:     Rate and Rhythm: Tachycardia present.  Pulmonary:     Effort: Pulmonary effort is normal.  Abdominal:     Palpations: Abdomen is soft.  Musculoskeletal:        General: Normal range of motion.  Skin:    General: Skin is warm.  Neurological:     Mental Status: She is  alert and oriented to person, place, and time.  Psychiatric:        Mood and Affect: Mood normal.      Disposition: Status is: Inpatient Remains inpatient appropriate because: IV fluids and IV antibx as above   Planned Discharge Destination: Home    Time spent: 35 minutes  Author: Oumou Smead , MD 02/08/2024 12:39 PM  For on call review [email or web address].

## 2024-02-08 NOTE — Progress Notes (Signed)
   02/08/24 2152  BiPAP/CPAP/SIPAP  BiPAP/CPAP/SIPAP Pt Type Adult  BiPAP/CPAP/SIPAP Resmed  Mask Type Nasal pillows  Dentures removed? Not applicable  FiO2 (%) 21 %  Patient Home Machine Yes  Safety Check Completed by RT for Home Unit Yes, no issues noted  Patient Home Mask Yes  Patient Home Tubing Yes  CPAP/SIPAP surface wiped down Yes  Device Plugged into RED Power Outlet Yes

## 2024-02-08 NOTE — ED Notes (Signed)
 Husband assisted patient to the bathroom with wheelchair

## 2024-02-08 NOTE — Progress Notes (Signed)
 Patients surgery this morning is cancelled due to Sepsis.  Called to speak with Dr Shane to confirm.  OR Desk notified

## 2024-02-08 NOTE — Progress Notes (Signed)
 Handoff given to Karna Jacobsen RN

## 2024-02-09 ENCOUNTER — Other Ambulatory Visit: Payer: Self-pay | Admitting: Urology

## 2024-02-09 DIAGNOSIS — N390 Urinary tract infection, site not specified: Secondary | ICD-10-CM | POA: Diagnosis not present

## 2024-02-09 DIAGNOSIS — A419 Sepsis, unspecified organism: Secondary | ICD-10-CM | POA: Diagnosis not present

## 2024-02-09 LAB — CBC
HCT: 35.5 % — ABNORMAL LOW (ref 36.0–46.0)
Hemoglobin: 11.2 g/dL — ABNORMAL LOW (ref 12.0–15.0)
MCH: 28.8 pg (ref 26.0–34.0)
MCHC: 31.5 g/dL (ref 30.0–36.0)
MCV: 91.3 fL (ref 80.0–100.0)
Platelets: 230 K/uL (ref 150–400)
RBC: 3.89 MIL/uL (ref 3.87–5.11)
RDW: 13.9 % (ref 11.5–15.5)
WBC: 10.6 K/uL — ABNORMAL HIGH (ref 4.0–10.5)
nRBC: 0 % (ref 0.0–0.2)

## 2024-02-09 LAB — GLUCOSE, CAPILLARY
Glucose-Capillary: 143 mg/dL — ABNORMAL HIGH (ref 70–99)
Glucose-Capillary: 164 mg/dL — ABNORMAL HIGH (ref 70–99)
Glucose-Capillary: 165 mg/dL — ABNORMAL HIGH (ref 70–99)
Glucose-Capillary: 149 mg/dL — ABNORMAL HIGH (ref 70–99)

## 2024-02-09 LAB — COMPREHENSIVE METABOLIC PANEL WITH GFR
ALT: 20 U/L (ref 0–44)
AST: 22 U/L (ref 15–41)
Albumin: 3.1 g/dL — ABNORMAL LOW (ref 3.5–5.0)
Alkaline Phosphatase: 87 U/L (ref 38–126)
Anion gap: 6 (ref 5–15)
BUN: 17 mg/dL (ref 8–23)
CO2: 25 mmol/L (ref 22–32)
Calcium: 8.7 mg/dL — ABNORMAL LOW (ref 8.9–10.3)
Chloride: 104 mmol/L (ref 98–111)
Creatinine, Ser: 0.99 mg/dL (ref 0.44–1.00)
GFR, Estimated: >60 mL/min (ref >60)
Glucose, Bld: 149 mg/dL — ABNORMAL HIGH (ref 70–99)
Potassium: 4 mmol/L (ref 3.5–5.1)
Sodium: 135 mmol/L (ref 135–145)
Total Bilirubin: 0.4 mg/dL (ref 0.0–1.2)
Total Protein: 6.5 g/dL (ref 6.5–8.1)

## 2024-02-09 LAB — URINE CULTURE: Culture: 80000 — AB

## 2024-02-09 LAB — PHOSPHORUS: Phosphorus: 2.8 mg/dL (ref 2.5–4.6)

## 2024-02-09 LAB — MAGNESIUM: Magnesium: 2 mg/dL (ref 1.7–2.4)

## 2024-02-09 LAB — C-REACTIVE PROTEIN: CRP: 22.1 mg/dL — ABNORMAL HIGH (ref <1.0)

## 2024-02-09 MED ORDER — POTASSIUM CITRATE ER 10 MEQ (1080 MG) PO TBCR
10.0000 meq | EXTENDED_RELEASE_TABLET | ORAL | Status: DC
  Administered 2024-02-09 – 2024-02-15 (×4): 10 meq via ORAL
  Filled 2024-02-09 (×6): qty 1

## 2024-02-09 NOTE — Progress Notes (Signed)
   02/09/24 2307  BiPAP/CPAP/SIPAP  BiPAP/CPAP/SIPAP Resmed  Mask Type Nasal pillows  Dentures removed? Not applicable  FiO2 (%) 21 %  Patient Home Machine Yes  Safety Check Completed by RT for Home Unit Yes, no issues noted  Patient Home Mask Yes  Patient Home Tubing Yes  Device Plugged into RED Power Outlet Yes  BiPAP/CPAP /SiPAP Vitals  Resp (!) 22  SpO2 92 %  MEWS Score/Color  MEWS Score 2  MEWS Score Color Yellow

## 2024-02-09 NOTE — Progress Notes (Signed)
 Progress Note   Patient: Jaime Nichols FMW:[id number] DOB: 04/05/1961 DOA: 02/07/2024     2 DOS: the patient was seen and examined on 02/09/2024   Brief hospital course: 1 F w/ pyelonephritis in setting of left ureteral stent.  Right sided non obstructing stone. Pt was planned for ureteroscopy on 8/21 but this was cancelled by urology due to the pt's current hospital admission for sepsis/pyelonephritis.   Assessment and Plan: 1.Sepsis due to klebsiella pneumoniae ++ ESBL UTI,  L pyelonephritis/hydronephrosis w/ L urteteral stent in place - IV meropenem  1 g q8hr  - Flomax  0.4 mg PO daily  - Tylenol  PRN  - Potassium citrate  UROCIT-K  SR tablet 10 meq Mon-Fri - Received an EPIC chat from Steffan Pea MD at 6:56 AM today (02/09/2024). He advised --> ureteroscopy to remove her kidney stone on Tuesday 02/13/2024. Given the fact the pt has ESBL Klebsiella pneumoniae in her urine she should remain in the hospital for IV meropenem  anyways as meropenem  is only available in the hospital. Thus, I agree with keeping the pt until Tues for the aforementioned procedure. Continue meropenem  in the meantime.   2. DM2 - Novolog  SS ACHS    3. HFpEF  - Currently euvolemic; monitor    4. HTN - Cardizem  CD 120 mg PO daily  - Toprol  XL 25 mg PO daily  - Lisinopril  10 mg PO daily    5. Gout  - Allopurinol  300 mg PO daily    6. OSA - CPAP at bedtime   7. Class III obesity  - BMI 43.20 kg/m2  Subjective: Pt seen and examined at the bedside. Urine cx is positive for Klebsiella pneumoniae ESBL. She continues on IV meropenem . WBC downtrending 16.4 -->10.6.  100.7 Fever on 02/08/2024 @ 831PM. No official fever since then.  Received an EPIC chat from Steffan Pea MD at 656 AM today (02/09/2024). He advised --> ureteroscopy to remove her kidney stone on Tuesday 02/13/2024.   Given the fact the pt has ESBL Klebsiella pneumoniae in her urine she should remain in the hospital for IV meropenem  anyways  as meropenem  is only available in the hospital. Thus, I agree with keeping the pt until Tues for the aforementioned procedure. Continue meropenem  in the meantime.  Physical Exam: Vitals:   02/08/24 1639 02/08/24 2031 02/09/24 0020 02/09/24 0358  BP: 110/62 131/63 109/62 112/65  Pulse: 95 97 100 95  Resp: 20 18 16 16   Temp: 99.4 F (37.4 C) (!) 100.7 F (38.2 C) 99.4 F (37.4 C) 99.4 F (37.4 C)  TempSrc: Oral Oral Oral Oral  SpO2: 95% 99% 95% 97%  Weight:      Height:       HENT:     Head: Normocephalic.     Mouth/Throat:     Mouth: Mucous membranes are moist.  Cardiovascular:     Rate and Rhythm: RRR Pulmonary:     Effort: Pulmonary effort is normal.  Abdominal:     Palpations: Abdomen is soft.  Musculoskeletal:        General: Normal range of motion.  Skin:    General: Skin is warm.  Neurological:     Mental Status: She is alert and oriented to person, place, and time.  Psychiatric:        Mood and Affect: Mood normal.     Disposition: Status is: Inpatient Remains inpatient appropriate because: IV antibx and surgery with Dr. Pea on Tues 02/13/2024  Planned Discharge Destination: Barriers to discharge: As noted above  Time spent: 35 minutes  Author: Zian Delair , MD 02/09/2024 1:25 PM  For on call review [email or web address].

## 2024-02-09 NOTE — Progress Notes (Addendum)
 Subjective: Pt doing well, pain, labs, and vitals improving   Objective: Vital signs in last 24 hours: Temp:  [98.8 F (37.1 C)-100.7 F (38.2 C)] 99.4 F (37.4 C) (08/22 0358) Pulse Rate:  [95-116] 95 (08/22 0358) Resp:  [16-26] 16 (08/22 0358) BP: (103-131)/(53-65) 112/65 (08/22 0358) SpO2:  [95 %-99 %] 97 % (08/22 0358) FiO2 (%):  [21 %] 21 % (08/21 2152)  Intake/Output from previous day: 08/21 0701 - 08/22 0700 In: 220 [P.O.:120; IV Piggyback:100] Out: -  Intake/Output this shift: No intake/output data recorded.  Physical Exam:  General: Alert and oriented CV: RRR Lungs: Clear GU: no foley   Lab Results: Recent Labs    02/07/24 1845 02/08/24 0418 02/09/24 0356  HGB 13.5 11.4* 11.2*  HCT 41.9 36.1 35.5*   BMET Recent Labs    02/08/24 0418 02/09/24 0356  NA 134* 135  K 4.3 4.0  CL 104 104  CO2 21* 25  GLUCOSE 232* 149*  BUN 18 17  CREATININE 0.90 0.99  CALCIUM  8.7* 8.7*     Studies/Results: CT Renal Stone Study Result Date: 02/07/2024 CLINICAL DATA:  Abdominal and flank pain. EXAM: CT ABDOMEN AND PELVIS WITHOUT CONTRAST TECHNIQUE: Multidetector CT imaging of the abdomen and pelvis was performed following the standard protocol without IV contrast. RADIATION DOSE REDUCTION: This exam was performed according to the departmental dose-optimization program which includes automated exposure control, adjustment of the mA and/or kV according to patient size and/or use of iterative reconstruction technique. COMPARISON:  CT abdomen and pelvis 01/01/2024. FINDINGS: Lower chest: No acute abnormality. Hepatobiliary: No focal liver abnormality is seen. Status post cholecystectomy. No biliary dilatation. Pancreas: Unremarkable. No pancreatic ductal dilatation or surrounding inflammatory changes. Spleen: Normal in size without focal abnormality. Adrenals/Urinary Tract: Left ureteral stent is in place, new from prior. There is mild left-sided hydronephrosis. 3 mm calculus in  the proximal left ureter is unchanged in position. There is mild left perinephric stranding, similar to prior. Additional punctate nonobstructing left renal calculi are also stable. Right renal calculi are unchanged from the prior examination including a calculus in the right renal pelvis measuring 9 mm. There is no hydronephrosis. There is a cyst in the right kidney measuring 17 mm, unchanged from prior. The adrenal glands are within normal limits. Stomach/Bowel: Stomach is within normal limits. Appendix appears normal. No evidence of bowel wall thickening, distention, or inflammatory changes. Vascular/Lymphatic: Aortic atherosclerosis. No enlarged abdominal or pelvic lymph nodes. Reproductive: Status post hysterectomy. No adnexal masses. Other: There is no ascites. There is a small fat containing umbilical hernia. Musculoskeletal: No acute or significant osseous findings. IMPRESSION: 1. New left ureteral stent in place. 2. Stable mild left-sided hydronephrosis. Unchanged 3 mm calculus in the proximal left ureter. 3. Unchanged left perinephric stranding and fluid. Infection not excluded. 4. Additional nonobstructing bilateral renal calculi are unchanged. Aortic Atherosclerosis (ICD10-I70.0). Electronically Signed   By: Greig Pique M.D.   On: 02/07/2024 19:24    Assessment/Plan: 46 F w/ pyelonephritis in setting of left ureteral stent. Admitted to medicine. Right sided non obstructing stone.    # Pyelonephritis  - left stent in place  - no intervention from urology warranted - continue brad spectrum abx (cefepime  and meropenem ) - blood and urine cultures pending    # Bilateral stones - planned of ureteroscopy 8/21- this was cancelled - I will check today if there is OR time available on Tuesday 8/26 for ureteroscopy. If there it would be preferred if the patient stays in house  on IV abx until URS can be completed     Will plan for URS w/ LL on Tuesday 8/26 please make NPO at midnight on Monday.     LOS: 2 days   Jackey Pea MD 02/09/2024, 7:03 AM [facility]

## 2024-02-09 NOTE — Progress Notes (Signed)
   02/09/24 1250  TOC Brief Assessment  Insurance and Status Reviewed  Patient has primary care physician Yes  Home environment has been reviewed home with spouse  Prior level of function: independent  Prior/Current Home Services No current home services  Social Drivers of Health Review SDOH reviewed no interventions necessary  Readmission risk has been reviewed Yes  Transition of care needs no transition of care needs at this time

## 2024-02-10 DIAGNOSIS — N390 Urinary tract infection, site not specified: Secondary | ICD-10-CM | POA: Diagnosis not present

## 2024-02-10 DIAGNOSIS — A419 Sepsis, unspecified organism: Secondary | ICD-10-CM | POA: Diagnosis not present

## 2024-02-10 LAB — COMPREHENSIVE METABOLIC PANEL WITH GFR
ALT: 27 U/L (ref 0–44)
AST: 24 U/L (ref 15–41)
Albumin: 2.9 g/dL — ABNORMAL LOW (ref 3.5–5.0)
Alkaline Phosphatase: 80 U/L (ref 38–126)
Anion gap: 9 (ref 5–15)
BUN: 16 mg/dL (ref 8–23)
CO2: 27 mmol/L (ref 22–32)
Calcium: 8.9 mg/dL (ref 8.9–10.3)
Chloride: 102 mmol/L (ref 98–111)
Creatinine, Ser: 0.76 mg/dL (ref 0.44–1.00)
GFR, Estimated: >60 mL/min (ref >60)
Glucose, Bld: 144 mg/dL — ABNORMAL HIGH (ref 70–99)
Potassium: 4.3 mmol/L (ref 3.5–5.1)
Sodium: 138 mmol/L (ref 135–145)
Total Bilirubin: 0.6 mg/dL (ref 0.0–1.2)
Total Protein: 6.3 g/dL — ABNORMAL LOW (ref 6.5–8.1)

## 2024-02-10 LAB — C-REACTIVE PROTEIN: CRP: 12.7 mg/dL — ABNORMAL HIGH (ref <1.0)

## 2024-02-10 LAB — GLUCOSE, CAPILLARY
Glucose-Capillary: 133 mg/dL — ABNORMAL HIGH (ref 70–99)
Glucose-Capillary: 154 mg/dL — ABNORMAL HIGH (ref 70–99)
Glucose-Capillary: 160 mg/dL — ABNORMAL HIGH (ref 70–99)
Glucose-Capillary: 212 mg/dL — ABNORMAL HIGH (ref 70–99)

## 2024-02-10 LAB — CBC
HCT: 36 % (ref 36.0–46.0)
Hemoglobin: 11 g/dL — ABNORMAL LOW (ref 12.0–15.0)
MCH: 28 pg (ref 26.0–34.0)
MCHC: 30.6 g/dL (ref 30.0–36.0)
MCV: 91.6 fL (ref 80.0–100.0)
Platelets: 239 K/uL (ref 150–400)
RBC: 3.93 MIL/uL (ref 3.87–5.11)
RDW: 13.8 % (ref 11.5–15.5)
WBC: 5.7 K/uL (ref 4.0–10.5)
nRBC: 0 % (ref 0.0–0.2)

## 2024-02-10 LAB — PHOSPHORUS: Phosphorus: 2.8 mg/dL (ref 2.5–4.6)

## 2024-02-10 LAB — MAGNESIUM: Magnesium: 1.8 mg/dL (ref 1.7–2.4)

## 2024-02-10 MED ORDER — SENNOSIDES-DOCUSATE SODIUM 8.6-50 MG PO TABS
1.0000 | ORAL_TABLET | Freq: Every day | ORAL | Status: DC
  Administered 2024-02-11 – 2024-02-16 (×3): 1 via ORAL
  Filled 2024-02-10 (×4): qty 1

## 2024-02-10 MED ORDER — FERROUS SULFATE 325 (65 FE) MG PO TABS
325.0000 mg | ORAL_TABLET | Freq: Every day | ORAL | Status: DC
  Administered 2024-02-14: 325 mg via ORAL
  Filled 2024-02-10 (×5): qty 1

## 2024-02-10 MED ORDER — FOLIC ACID 1 MG PO TABS: 1.0000 mg | ORAL_TABLET | Freq: Every day | ORAL | Status: DC

## 2024-02-10 MED ORDER — POLYETHYLENE GLYCOL 3350 17 G PO PACK
17.0000 g | PACK | Freq: Every day | ORAL | Status: DC
  Administered 2024-02-10 – 2024-02-16 (×5): 17 g via ORAL
  Filled 2024-02-10 (×4): qty 1

## 2024-02-10 NOTE — Progress Notes (Signed)
 PROGRESS NOTE    Jaime Nichols January  FMW:[id number] DOB: 29-Jul-1960 DOA: 02/07/2024 PCP: Aisha Harvey, MD   Brief Narrative: 63 year old with past medical history significant for CAD, chronic diastolic heart failure, diabetes type 2, hyperlipidemia, hypertension, morbid obesity, OSA on CPAP, PVCs, bilateral nephrolithiasis status post left ureteral stent placement on 01/01/2024 recurrent UTI currently she was taking Bactrim  and ciprofloxacin  presents for evaluation of fever chills, nausea dysuria, since she had left ureteral stent placed 5 weeks ago she has had persistent urinary tract infection.  She completed 10 days of Levaquin without significant improvement.  Patient presents with fever, tachycardia white blood cell 19 UA with more than 50 white blood cell.  CT renal stone study showed stable left ureteral stent, stable mild left-sided hydronephrosis, unchanged 3 mm calculus in the proximal left ureter unchanged left perinephric stranding. Patient was admitted for further management of infectious process.   63 year old with past medical history significant for pyelonephritis in the setting of left ureteral stent, right side nonobstructive stone, she was planning for ureteroscopy on 8/21st but this was canceled by urology due to patient's current hospital admission for sepsis and pyelonephritis   Assessment & Plan:   Principal Problem:   Sepsis secondary to UTI [facility]) Active Problems:   Bilateral nephrolithiasis   Acute cystitis with hematuria   Sepsis (HCC)   1-Sepsis due to UTI, left pyelonephritis/hydronephrosis with left ureteral stent in place: Bilateral nephrolithiasis: Patient presents with persistent fever, dysuria, UA with more than 50 white blood cell. Urine culture: 80,000 colonies Klebsiella pneumoniae, ESBL sensitive to meropenem . Blood cultures: No growth to date. Continue IV meropenem  Urology planning URS with LL on Tuesday 8/26 -Continue Flomax  WBC trending  down, had fever yesterday, so far today afebrile.  Post procedure will need to consult ID for antibiotics length recommendations.   Diabetes type 2: -Continue sliding scale insulin   Heart failure preserved ejection fraction chronic: - Continue metoprolol , lisinopril , cardizem   Hypertension: Continue Cardizem , lisinopril  and metoprolol .  Gout: - Continue allopurinol .  OSA: - Continue BiPAP CPAP at bedtime  Type +3 obesity Need lifestyle modification  Anemia:  Start folic acid , ferrous sulfate .  Denies BM , no melena Mildly decreased Hb at 11.   Constipation; start Laxatives.   Estimated body mass index is 43.2 kg/m as calculated from the following:   Height as of this encounter: 5' 2.5 (1.588 m).   Weight as of this encounter: 108.9 kg.   DVT prophylaxis: Lovenox  Code Status: Full code Family Communication: Care discussed with patient Disposition Plan:  Status is: Inpatient Remains inpatient appropriate because: management of  UTI    Consultants:  Urology   Procedures:  none  Antimicrobials:    Subjective: She denies back pain, dysuria improved. We talk about her anemia.  Also she wants to make sure she is stable for procedure ton Tuesday.    Objective: Vitals:   02/09/24 1403 02/09/24 2039 02/09/24 2307 02/10/24 0619  BP: 131/61 (!) 140/61  136/72  Pulse: 95 90  94  Resp: 18  (!) 22   Temp: (!) 101.7 F (38.7 C) (!) 100.5 F (38.1 C)  98.8 F (37.1 C)  TempSrc: Oral Oral  Oral  SpO2: 97% 96% 92% 99%  Weight:      Height:        Intake/Output Summary (Last 24 hours) at 02/10/2024 0737 Last data filed at 02/09/2024 1700 Gross per 24 hour  Intake 240 ml  Output --  Net 240 ml   [employer]  02/07/24 1822  Weight: 108.9 kg    Examination:  General exam: Appears calm and comfortable  Respiratory system: Clear to auscultation. Respiratory effort normal. Cardiovascular system: S1 & S2 heard, RRR. No JVD, murmurs, rubs, gallops or  clicks. No pedal edema. Gastrointestinal system: Abdomen is nondistended, soft and nontender. No organomegaly or masses felt. Normal bowel sounds heard. Central nervous system: Alert and oriented. No focal neurological deficits. Extremities: Symmetric 5 x 5 power.    Data Reviewed: I have personally reviewed following labs and imaging studies  CBC: Recent Labs  Lab 02/07/24 1845 02/08/24 0418 02/09/24 0356 02/10/24 0435  WBC 19.5* 16.4* 10.6* 5.7  NEUTROABS 17.1*  --   --   --   HGB 13.5 11.4* 11.2* 11.0*  HCT 41.9 36.1 35.5* 36.0  MCV 86.7 88.0 91.3 91.6  PLT 284 232 230 239   Basic Metabolic Panel: Recent Labs  Lab 02/07/24 1845 02/08/24 0418 02/09/24 0356 02/10/24 0435  NA 133* 134* 135 138  K 4.2 4.3 4.0 4.3  CL 102 104 104 102  CO2 19* 21* 25 27  GLUCOSE 215* 232* 149* 144*  BUN 19 18 17 16   CREATININE 1.03* 0.90 0.99 0.76  CALCIUM  9.3 8.7* 8.7* 8.9  MG  --   --  2.0 1.8  PHOS  --   --  2.8 2.8   GFR: Estimated Creatinine Clearance: 83.3 mL/min (by C-G formula based on SCr of 0.76 mg/dL). Liver Function Tests: Recent Labs  Lab 02/07/24 1845 02/09/24 0356 02/10/24 0435  AST 13* 22 24  ALT 14 20 27   ALKPHOS 101 87 80  BILITOT 1.1 0.4 0.6  PROT 7.8 6.5 6.3*  ALBUMIN 4.0 3.1* 2.9*   No results for input(s): LIPASE, AMYLASE in the last 168 hours. No results for input(s): AMMONIA in the last 168 hours. Coagulation Profile: Recent Labs  Lab 02/07/24 1845  INR 1.1   Cardiac Enzymes: No results for input(s): CKTOTAL, CKMB, CKMBINDEX, TROPONINI in the last 168 hours. BNP (last 3 results) No results for input(s): PROBNP in the last 8760 hours. HbA1C: No results for input(s): HGBA1C in the last 72 hours. CBG: Recent Labs  Lab 02/09/24 0740 02/09/24 1111 02/09/24 1627 02/09/24 2040 02/10/24 0730  GLUCAP 143* 164* 165* 149* 133*   Lipid Profile: No results for input(s): CHOL, HDL, LDLCALC, TRIG, CHOLHDL, LDLDIRECT  in the last 72 hours. Thyroid  Function Tests: No results for input(s): TSH, T4TOTAL, FREET4, T3FREE, THYROIDAB in the last 72 hours. Anemia Panel: No results for input(s): VITAMINB12, FOLATE, FERRITIN, TIBC, IRON, RETICCTPCT in the last 72 hours. Sepsis Labs: Recent Labs  Lab 02/07/24 1856  LATICACIDVEN 1.5    Recent Results (from the past 240 hours)  Culture, blood (Routine x 2)     Status: None (Preliminary result)   Collection Time: 02/07/24  6:45 PM   Specimen: BLOOD LEFT HAND  Result Value Ref Range Status   Specimen Description   Final    BLOOD LEFT HAND Performed at [facility], [address] [place], [place] [address]    Special Requests   Final    BOTTLES DRAWN AEROBIC AND ANAEROBIC Blood Culture adequate volume Performed at [facility], [address] [place], [place] [address]    Culture   Final    NO GROWTH 2 DAYS Performed at [facility], [address] [place], [place] [address]    Report Status PENDING  Incomplete  Urine Culture  Status: Abnormal   Collection Time: 02/07/24  7:20 PM   Specimen: Urine, Clean Catch  Result Value Ref Range Status   Specimen Description   Final    URINE, CLEAN CATCH Performed at [facility], [address], [place], [place] [address]    Special Requests   Final    NONE Performed at [facility], [address] [place], [place] [address]    Culture (A)  Final    80,000 COLONIES/mL KLEBSIELLA PNEUMONIAE Confirmed Extended Spectrum Beta-Lactamase Producer (ESBL).  In bloodstream infections from ESBL organisms, carbapenems are preferred over piperacillin/tazobactam. They are shown to have a lower risk of mortality.    Report Status 02/09/2024 FINAL  Final   Organism ID, Bacteria KLEBSIELLA PNEUMONIAE (A)  Final      Susceptibility   Klebsiella pneumoniae - MIC*    AMPICILLIN >=32 RESISTANT Resistant     CEFAZOLIN  (URINE)  Value in next row Resistant      >=32 RESISTANTThis is a modified FDA-approved test that has been validated and its performance characteristics determined by the reporting laboratory.  This laboratory is certified under the Clinical Laboratory Improvement Amendments CLIA as qualified to perform high complexity clinical laboratory testing.    CEFEPIME  Value in next row Resistant      >=32 RESISTANTThis is a modified FDA-approved test that has been validated and its performance characteristics determined by the reporting laboratory.  This laboratory is certified under the Clinical Laboratory Improvement Amendments CLIA as qualified to perform high complexity clinical laboratory testing.    ERTAPENEM  Value in next row Sensitive      >=32 RESISTANTThis is a modified FDA-approved test that has been validated and its performance characteristics determined by the reporting laboratory.  This laboratory is certified under the Clinical Laboratory Improvement Amendments CLIA as qualified to perform high complexity clinical laboratory testing.    CEFTRIAXONE  Value in next row Resistant      >=32 RESISTANTThis is a modified FDA-approved test that has been validated and its performance characteristics determined by the reporting laboratory.  This laboratory is certified under the Clinical Laboratory Improvement Amendments CLIA as qualified to perform high complexity clinical laboratory testing.    CIPROFLOXACIN  Value in next row Resistant      >=32 RESISTANTThis is a modified FDA-approved test that has been validated and its performance characteristics determined by the reporting laboratory.  This laboratory is certified under the Clinical Laboratory Improvement Amendments CLIA as qualified to perform high complexity clinical laboratory testing.    GENTAMICIN  Value in next row Resistant      >=32 RESISTANTThis is a modified FDA-approved test that has been validated and its performance characteristics determined by the  reporting laboratory.  This laboratory is certified under the Clinical Laboratory Improvement Amendments CLIA as qualified to perform high complexity clinical laboratory testing.    NITROFURANTOIN Value in next row Resistant      >=32 RESISTANTThis is a modified FDA-approved test that has been validated and its performance characteristics determined by the reporting laboratory.  This laboratory is certified under the Clinical Laboratory Improvement Amendments CLIA as qualified to perform high complexity clinical laboratory testing.    TRIMETH /SULFA  Value in next row Resistant      >=32 RESISTANTThis is a modified FDA-approved test that has been validated and its performance characteristics determined by the reporting laboratory.  This laboratory is certified under the Clinical Laboratory Improvement Amendments CLIA as qualified to perform high complexity clinical laboratory testing.  AMPICILLIN/SULBACTAM Value in next row Resistant      >=32 RESISTANTThis is a modified FDA-approved test that has been validated and its performance characteristics determined by the reporting laboratory.  This laboratory is certified under the Clinical Laboratory Improvement Amendments CLIA as qualified to perform high complexity clinical laboratory testing.    PIP/TAZO Value in next row Intermediate ug/mL     64 INTERMEDIATEThis is a modified FDA-approved test that has been validated and its performance characteristics determined by the reporting laboratory.  This laboratory is certified under the Clinical Laboratory Improvement Amendments CLIA as qualified to perform high complexity clinical laboratory testing.    MEROPENEM  Value in next row Sensitive      64 INTERMEDIATEThis is a modified FDA-approved test that has been validated and its performance characteristics determined by the reporting laboratory.  This laboratory is certified under the Clinical Laboratory Improvement Amendments CLIA as qualified to perform high  complexity clinical laboratory testing.    * 80,000 COLONIES/mL KLEBSIELLA PNEUMONIAE  Culture, blood (Routine x 2)     Status: None (Preliminary result)   Collection Time: 02/07/24  7:58 PM   Specimen: BLOOD  Result Value Ref Range Status   Specimen Description   Final    BLOOD BLOOD RIGHT HAND Performed at [facility], [address], [place], [place] [address]    Special Requests   Final    Blood Culture adequate volume BOTTLES DRAWN AEROBIC AND ANAEROBIC Performed at [facility], [address], [place], [place] [address]    Culture   Final    NO GROWTH 1 DAY Performed at [facility], [address], [place], [place] [address]    Report Status PENDING  Incomplete         Radiology Studies: No results found.      Scheduled Meds:  allopurinol   300 mg Oral Daily   diltiazem   120 mg Oral Daily   enoxaparin  (LOVENOX ) injection  40 mg Subcutaneous Q24H   feeding supplement  237 mL Oral BID BM   insulin  aspart  0-15 Units Subcutaneous TID WC   insulin  aspart  0-5 Units Subcutaneous QHS   lisinopril   10 mg Oral Daily   metoprolol  succinate  25 mg Oral Daily   potassium citrate   10 mEq Oral Once per day on Monday Tuesday Wednesday Thursday Friday   tamsulosin   0.4 mg Oral QPC supper   Continuous Infusions:  meropenem  (MERREM ) IV 1 g (02/09/24 2331)     LOS: 3 days    Time spent: 35 Minutes    Thinh Cuccaro A Judge Duque, MD [facility]   If 7PM-7AM, please contact night-coverage [email or web address]  02/10/2024, 7:37 AM

## 2024-02-10 NOTE — Plan of Care (Signed)

## 2024-02-11 DIAGNOSIS — N390 Urinary tract infection, site not specified: Secondary | ICD-10-CM | POA: Diagnosis not present

## 2024-02-11 DIAGNOSIS — A419 Sepsis, unspecified organism: Secondary | ICD-10-CM | POA: Diagnosis not present

## 2024-02-11 LAB — BASIC METABOLIC PANEL WITH GFR
Anion gap: 10 (ref 5–15)
BUN: 15 mg/dL (ref 8–23)
CO2: 28 mmol/L (ref 22–32)
Calcium: 9 mg/dL (ref 8.9–10.3)
Chloride: 101 mmol/L (ref 98–111)
Creatinine, Ser: 0.75 mg/dL (ref 0.44–1.00)
GFR, Estimated: >60 mL/min (ref >60)
Glucose, Bld: 163 mg/dL — ABNORMAL HIGH (ref 70–99)
Potassium: 4.4 mmol/L (ref 3.5–5.1)
Sodium: 139 mmol/L (ref 135–145)

## 2024-02-11 LAB — GLUCOSE, CAPILLARY
Glucose-Capillary: 126 mg/dL — ABNORMAL HIGH (ref 70–99)
Glucose-Capillary: 166 mg/dL — ABNORMAL HIGH (ref 70–99)
Glucose-Capillary: 143 mg/dL — ABNORMAL HIGH (ref 70–99)
Glucose-Capillary: 135 mg/dL — ABNORMAL HIGH (ref 70–99)

## 2024-02-11 LAB — CBC
HCT: 34.7 % — ABNORMAL LOW (ref 36.0–46.0)
Hemoglobin: 11.2 g/dL — ABNORMAL LOW (ref 12.0–15.0)
MCH: 29.1 pg (ref 26.0–34.0)
MCHC: 32.3 g/dL (ref 30.0–36.0)
MCV: 90.1 fL (ref 80.0–100.0)
Platelets: 226 K/uL (ref 150–400)
RBC: 3.85 MIL/uL — ABNORMAL LOW (ref 3.87–5.11)
RDW: 13.7 % (ref 11.5–15.5)
WBC: 6.3 K/uL (ref 4.0–10.5)
nRBC: 0 % (ref 0.0–0.2)

## 2024-02-11 LAB — URINALYSIS, ROUTINE W REFLEX MICROSCOPIC
Bilirubin Urine: NEGATIVE
Glucose, UA: NEGATIVE mg/dL
Ketones, ur: NEGATIVE mg/dL
Nitrite: NEGATIVE
Protein, ur: NEGATIVE mg/dL
Specific Gravity, Urine: 1.011 (ref 1.005–1.030)
pH: 5 (ref 5.0–8.0)

## 2024-02-11 LAB — MAGNESIUM: Magnesium: 1.7 mg/dL (ref 1.7–2.4)

## 2024-02-11 MED ORDER — MAGNESIUM SULFATE 2 GM/50ML IV SOLN
2.0000 g | Freq: Once | INTRAVENOUS | Status: AC
  Administered 2024-02-11: 2 g via INTRAVENOUS
  Filled 2024-02-11: qty 50

## 2024-02-11 NOTE — Progress Notes (Signed)
  Subjective: Doing well, labs improving tolerating diet. Cx shows Klebsiella MDRO  Objective: Vital signs in last 24 hours: Temp:  [98.8 F (37.1 C)-101.3 F (38.5 C)] 99.1 F (37.3 C) (08/24 0513) Pulse Rate:  [80-98] 80 (08/24 0513) Resp:  [18-20] 20 (08/24 0513) BP: (119-141)/(58-94) 141/71 (08/24 0513) SpO2:  [94 %-97 %] 97 % (08/24 0513) FiO2 (%):  [21 %] 21 % (08/23 2212)  Intake/Output from previous day: 08/23 0701 - 08/24 0700 In: 784.3 [P.O.:60; IV Piggyback:724.3] Out: -  Intake/Output this shift: No intake/output data recorded.  Physical Exam:  General: Alert and oriented CV: RRR Lungs: Clear Ext: NT, No erythema  Lab Results: Recent Labs    02/09/24 0356 02/10/24 0435 02/11/24 0421  HGB 11.2* 11.0* 11.2*  HCT 35.5* 36.0 34.7*   BMET Recent Labs    02/10/24 0435 02/11/24 0421  NA 138 139  K 4.3 4.4  CL 102 101  CO2 27 28  GLUCOSE 144* 163*  BUN 16 15  CREATININE 0.76 0.75  CALCIUM  8.9 9.0     Studies/Results: No results found.  Assessment/Plan: 77 F w/ pyelonephritis in setting of left ureteral stent. Admitted to medicine. Right sided non obstructing stone.    # Pyelonephritis  - left stent in place  - no intervention from urology warranted - continue brad spectrum abx (cefepime  and meropenem ) - Cx growing MDRO Klebsiella on Meropenem     # Bilateral stones - planned of ureteroscopy 8/21- this was cancelled - URS planned for Tuesday make NPO at midnight.      LOS: 4 days   Jackey Pea MD 02/11/2024, 8:54 AM [facility]

## 2024-02-11 NOTE — Progress Notes (Signed)
   02/11/24 2355  BiPAP/CPAP/SIPAP  BiPAP/CPAP/SIPAP Pt Type Adult  BiPAP/CPAP/SIPAP Resmed  Mask Type Nasal pillows  Dentures removed? Not applicable  Patient Home Machine Yes  Safety Check Completed by RT for Home Unit Yes, no issues noted  Patient Home Mask Yes  Patient Home Tubing Yes  Device Plugged into [employer] Yes

## 2024-02-11 NOTE — Progress Notes (Signed)
 PROGRESS NOTE    Jaime Nichols  FMW:[id number] DOB: 09-May-1961 DOA: 02/07/2024 PCP: Aisha Harvey, MD   Brief Narrative: 63 year old with past medical history significant for CAD, chronic diastolic heart failure, diabetes type 2, hyperlipidemia, hypertension, morbid obesity, OSA on CPAP, PVCs, bilateral nephrolithiasis status post left ureteral stent placement on 01/01/2024 recurrent UTI currently she was taking Bactrim  and ciprofloxacin  presents for evaluation of fever chills, nausea dysuria, since she had left ureteral stent placed 5 weeks ago she has had persistent urinary tract infection.  She completed 10 days of Levaquin without significant improvement.  Patient presents with fever, tachycardia white blood cell 19 UA with more than 50 white blood cell.  CT renal stone study showed stable left ureteral stent, stable mild left-sided hydronephrosis, unchanged 3 mm calculus in the proximal left ureter unchanged left perinephric stranding. Patient was admitted for further management of infectious process.   63 year old with past medical history significant for pyelonephritis in the setting of left ureteral stent, right side nonobstructive stone, she was planning for ureteroscopy on 8/21st but this was canceled by urology due to patient's current hospital admission for sepsis and pyelonephritis   Assessment & Plan:   Principal Problem:   Sepsis secondary to UTI [facility]) Active Problems:   Bilateral nephrolithiasis   Acute cystitis with hematuria   Sepsis (HCC)   1-Sepsis due to UTI, left pyelonephritis/hydronephrosis with left ureteral stent in place: Bilateral nephrolithiasis: -Patient presents with persistent fever, dysuria, UA with more than 50 white blood cell. -Urine culture: 80,000 colonies Klebsiella pneumoniae, ESBL sensitive to meropenem . -Blood cultures: No growth to date. -Continue IV meropenem  -Urology planning URS with LL on Tuesday 8/26 -Continue Flomax  WBC trending  down, -had fever last night/  Post procedure will need to consult ID for antibiotics length recommendations.  Repeat UA  Diabetes type 2: -Continue sliding scale insulin   Heart failure preserved ejection fraction chronic: - Continue metoprolol , lisinopril , cardizem   Hypertension: Continue Cardizem , lisinopril  and metoprolol .  Gout: - Continue allopurinol .  OSA: - Continue BiPAP CPAP at bedtime  Type +3 obesity Need lifestyle modification  Anemia:  Start folic acid , ferrous sulfate .  Denies BM , no melena Mildly decreased Hb at 11.   Constipation; Continue with  Laxatives.   Estimated body mass index is 43.2 kg/m as calculated from the following:   Height as of this encounter: 5' 2.5 (1.588 m).   Weight as of this encounter: 108.9 kg.   DVT prophylaxis: Lovenox  Code Status: Full code Family Communication: Care discussed with patient Disposition Plan:  Status is: Inpatient Remains inpatient appropriate because: management of  UTI    Consultants:  Urology   Procedures:  none  Antimicrobials:    Subjective: Alert, feeling better overall. Had fever last night.  Dysuria improved.   Objective: Vitals:   02/10/24 2212 02/10/24 2213 02/10/24 2343 02/11/24 0513  BP:    (!) 141/71  Pulse: 86   80  Resp: 20   20  Temp:  99.6 F (37.6 C) 98.8 F (37.1 C) 99.1 F (37.3 C)  TempSrc:  Oral Oral Oral  SpO2: 94%   97%  Weight:      Height:        Intake/Output Summary (Last 24 hours) at 02/11/2024 0734 Last data filed at 02/11/2024 0600 Gross per 24 hour  Intake 784.31 ml  Output --  Net 784.31 ml   Filed Weights   02/07/24 1822  Weight: 108.9 kg    Examination:  General exam: NAD Respiratory  system: CTA Cardiovascular system: S, S 2 RRR Gastrointestinal system: BS present, soft, nt Central nervous system: alert Extremities: Symmetric 5 x 5 power.    Data Reviewed: I have personally reviewed following labs and imaging  studies  CBC: Recent Labs  Lab 02/07/24 1845 02/08/24 0418 02/09/24 0356 02/10/24 0435 02/11/24 0421  WBC 19.5* 16.4* 10.6* 5.7 6.3  NEUTROABS 17.1*  --   --   --   --   HGB 13.5 11.4* 11.2* 11.0* 11.2*  HCT 41.9 36.1 35.5* 36.0 34.7*  MCV 86.7 88.0 91.3 91.6 90.1  PLT 284 232 230 239 226   Basic Metabolic Panel: Recent Labs  Lab 02/07/24 1845 02/08/24 0418 02/09/24 0356 02/10/24 0435 02/11/24 0421  NA 133* 134* 135 138 139  K 4.2 4.3 4.0 4.3 4.4  CL 102 104 104 102 101  CO2 19* 21* 25 27 28   GLUCOSE 215* 232* 149* 144* 163*  BUN 19 18 17 16 15   CREATININE 1.03* 0.90 0.99 0.76 0.75  CALCIUM  9.3 8.7* 8.7* 8.9 9.0  MG  --   --  2.0 1.8 1.7  PHOS  --   --  2.8 2.8  --    GFR: Estimated Creatinine Clearance: 83.3 mL/min (by C-G formula based on SCr of 0.75 mg/dL). Liver Function Tests: Recent Labs  Lab 02/07/24 1845 02/09/24 0356 02/10/24 0435  AST 13* 22 24  ALT 14 20 27   ALKPHOS 101 87 80  BILITOT 1.1 0.4 0.6  PROT 7.8 6.5 6.3*  ALBUMIN 4.0 3.1* 2.9*   No results for input(s): LIPASE, AMYLASE in the last 168 hours. No results for input(s): AMMONIA in the last 168 hours. Coagulation Profile: Recent Labs  Lab 02/07/24 1845  INR 1.1   Cardiac Enzymes: No results for input(s): CKTOTAL, CKMB, CKMBINDEX, TROPONINI in the last 168 hours. BNP (last 3 results) No results for input(s): PROBNP in the last 8760 hours. HbA1C: No results for input(s): HGBA1C in the last 72 hours. CBG: Recent Labs  Lab 02/09/24 2040 02/10/24 0730 02/10/24 1118 02/10/24 1635 02/10/24 2054  GLUCAP 149* 133* 154* 160* 212*   Lipid Profile: No results for input(s): CHOL, HDL, LDLCALC, TRIG, CHOLHDL, LDLDIRECT in the last 72 hours. Thyroid  Function Tests: No results for input(s): TSH, T4TOTAL, FREET4, T3FREE, THYROIDAB in the last 72 hours. Anemia Panel: No results for input(s): VITAMINB12, FOLATE, FERRITIN, TIBC, IRON,  RETICCTPCT in the last 72 hours. Sepsis Labs: Recent Labs  Lab 02/07/24 1856  LATICACIDVEN 1.5    Recent Results (from the past 240 hours)  Culture, blood (Routine x 2)     Status: None (Preliminary result)   Collection Time: 02/07/24  6:45 PM   Specimen: BLOOD LEFT HAND  Result Value Ref Range Status   Specimen Description   Final    BLOOD LEFT HAND Performed at [facility], [address], [place], [place] [address]    Special Requests   Final    BOTTLES DRAWN AEROBIC AND ANAEROBIC Blood Culture adequate volume Performed at [facility], [address] [place], [place] [address]    Culture   Final    NO GROWTH 3 DAYS Performed at [facility], [address] [place], [place] [address]    Report Status PENDING  Incomplete  Urine Culture     Status: Abnormal   Collection Time: 02/07/24  7:20 PM   Specimen: Urine, Clean Catch  Result Value Ref Range Status   Specimen Description  Final    URINE, CLEAN CATCH Performed at [facility], [address], [place], [place] [address]    Special Requests   Final    NONE Performed at [facility], [address], [place], [place] [address]    Culture (A)  Final    80,000 COLONIES/mL KLEBSIELLA PNEUMONIAE Confirmed Extended Spectrum Beta-Lactamase Producer (ESBL).  In bloodstream infections from ESBL organisms, carbapenems are preferred over piperacillin/tazobactam. They are shown to have a lower risk of mortality.    Report Status 02/09/2024 FINAL  Final   Organism ID, Bacteria KLEBSIELLA PNEUMONIAE (A)  Final      Susceptibility   Klebsiella pneumoniae - MIC*    AMPICILLIN >=32 RESISTANT Resistant     CEFAZOLIN  (URINE) Value in next row Resistant      >=32 RESISTANTThis is a modified FDA-approved test that has been validated and its performance characteristics determined by the reporting laboratory.  This laboratory is certified under the Clinical  Laboratory Improvement Amendments CLIA as qualified to perform high complexity clinical laboratory testing.    CEFEPIME  Value in next row Resistant      >=32 RESISTANTThis is a modified FDA-approved test that has been validated and its performance characteristics determined by the reporting laboratory.  This laboratory is certified under the Clinical Laboratory Improvement Amendments CLIA as qualified to perform high complexity clinical laboratory testing.    ERTAPENEM  Value in next row Sensitive      >=32 RESISTANTThis is a modified FDA-approved test that has been validated and its performance characteristics determined by the reporting laboratory.  This laboratory is certified under the Clinical Laboratory Improvement Amendments CLIA as qualified to perform high complexity clinical laboratory testing.    CEFTRIAXONE  Value in next row Resistant      >=32 RESISTANTThis is a modified FDA-approved test that has been validated and its performance characteristics determined by the reporting laboratory.  This laboratory is certified under the Clinical Laboratory Improvement Amendments CLIA as qualified to perform high complexity clinical laboratory testing.    CIPROFLOXACIN  Value in next row Resistant      >=32 RESISTANTThis is a modified FDA-approved test that has been validated and its performance characteristics determined by the reporting laboratory.  This laboratory is certified under the Clinical Laboratory Improvement Amendments CLIA as qualified to perform high complexity clinical laboratory testing.    GENTAMICIN  Value in next row Resistant      >=32 RESISTANTThis is a modified FDA-approved test that has been validated and its performance characteristics determined by the reporting laboratory.  This laboratory is certified under the Clinical Laboratory Improvement Amendments CLIA as qualified to perform high complexity clinical laboratory testing.    NITROFURANTOIN Value in next row Resistant       >=32 RESISTANTThis is a modified FDA-approved test that has been validated and its performance characteristics determined by the reporting laboratory.  This laboratory is certified under the Clinical Laboratory Improvement Amendments CLIA as qualified to perform high complexity clinical laboratory testing.    TRIMETH /SULFA  Value in next row Resistant      >=32 RESISTANTThis is a modified FDA-approved test that has been validated and its performance characteristics determined by the reporting laboratory.  This laboratory is certified under the Clinical Laboratory Improvement Amendments CLIA as qualified to perform high complexity clinical laboratory testing.    AMPICILLIN/SULBACTAM Value in next row Resistant      >=32 RESISTANTThis is a modified FDA-approved test that has been validated and its performance characteristics determined by  the reporting laboratory.  This laboratory is certified under the Clinical Laboratory Improvement Amendments CLIA as qualified to perform high complexity clinical laboratory testing.    PIP/TAZO Value in next row Intermediate ug/mL     64 INTERMEDIATEThis is a modified FDA-approved test that has been validated and its performance characteristics determined by the reporting laboratory.  This laboratory is certified under the Clinical Laboratory Improvement Amendments CLIA as qualified to perform high complexity clinical laboratory testing.    MEROPENEM  Value in next row Sensitive      64 INTERMEDIATEThis is a modified FDA-approved test that has been validated and its performance characteristics determined by the reporting laboratory.  This laboratory is certified under the Clinical Laboratory Improvement Amendments CLIA as qualified to perform high complexity clinical laboratory testing.    * 80,000 COLONIES/mL KLEBSIELLA PNEUMONIAE  Culture, blood (Routine x 2)     Status: None (Preliminary result)   Collection Time: 02/07/24  7:58 PM   Specimen: BLOOD  Result Value Ref  Range Status   Specimen Description   Final    BLOOD BLOOD RIGHT HAND Performed at [facility], [address] [place], [place] [address]    Special Requests   Final    Blood Culture adequate volume BOTTLES DRAWN AEROBIC AND ANAEROBIC Performed at [facility], [address] [place], [place] [address]    Culture   Final    NO GROWTH 2 DAYS Performed at [facility], [address] [place], [place] [address]    Report Status PENDING  Incomplete         Radiology Studies: No results found.      Scheduled Meds:  allopurinol   300 mg Oral Daily   diltiazem   120 mg Oral Daily   enoxaparin  (LOVENOX ) injection  40 mg Subcutaneous Q24H   feeding supplement  237 mL Oral BID BM   ferrous sulfate   325 mg Oral Q breakfast   folic acid   1 mg Oral Daily   insulin  aspart  0-15 Units Subcutaneous TID WC   insulin  aspart  0-5 Units Subcutaneous QHS   lisinopril   10 mg Oral Daily   metoprolol  succinate  25 mg Oral Daily   polyethylene glycol  17 g Oral Daily   potassium citrate   10 mEq Oral Once per day on Monday Tuesday Wednesday Thursday Friday   senna-docusate  1 tablet Oral Daily   tamsulosin   0.4 mg Oral QPC supper   Continuous Infusions:  magnesium  sulfate bolus IVPB     meropenem  (MERREM ) IV 1 g (02/10/24 2342)     LOS: 4 days    Time spent: 35 Minutes    Elliott Quade A Rahma [place], MD [facility]   If 7PM-7AM, please contact night-coverage [email or web address]  02/11/2024, 7:34 AM

## 2024-02-11 NOTE — Plan of Care (Signed)
  Problem: Metabolic: Goal: Ability to maintain appropriate glucose levels will improve Outcome: Progressing   Problem: Clinical Measurements: Goal: Ability to maintain clinical measurements within normal limits will improve Outcome: Progressing Goal: Diagnostic test results will improve Outcome: Progressing   Problem: Elimination: Goal: Will not experience complications related to urinary retention Outcome: Progressing

## 2024-02-12 ENCOUNTER — Ambulatory Visit: Admitting: Physician Assistant

## 2024-02-12 ENCOUNTER — Other Ambulatory Visit: Payer: Self-pay

## 2024-02-12 DIAGNOSIS — N390 Urinary tract infection, site not specified: Secondary | ICD-10-CM | POA: Diagnosis not present

## 2024-02-12 DIAGNOSIS — A419 Sepsis, unspecified organism: Secondary | ICD-10-CM | POA: Diagnosis not present

## 2024-02-12 DIAGNOSIS — Z960 Presence of urogenital implants: Secondary | ICD-10-CM

## 2024-02-12 LAB — CBC
HCT: 35.3 % — ABNORMAL LOW (ref 36.0–46.0)
Hemoglobin: 11.4 g/dL — ABNORMAL LOW (ref 12.0–15.0)
MCH: 29.2 pg (ref 26.0–34.0)
MCHC: 32.3 g/dL (ref 30.0–36.0)
MCV: 90.5 fL (ref 80.0–100.0)
Platelets: 242 K/uL (ref 150–400)
RBC: 3.9 MIL/uL (ref 3.87–5.11)
RDW: 13.3 % (ref 11.5–15.5)
WBC: 7.6 K/uL (ref 4.0–10.5)
nRBC: 0 % (ref 0.0–0.2)

## 2024-02-12 LAB — GLUCOSE, CAPILLARY
Glucose-Capillary: 133 mg/dL — ABNORMAL HIGH (ref 70–99)
Glucose-Capillary: 167 mg/dL — ABNORMAL HIGH (ref 70–99)
Glucose-Capillary: 185 mg/dL — ABNORMAL HIGH (ref 70–99)
Glucose-Capillary: 166 mg/dL — ABNORMAL HIGH (ref 70–99)

## 2024-02-12 LAB — CULTURE, BLOOD (ROUTINE X 2)
Culture: NO GROWTH
Special Requests: ADEQUATE

## 2024-02-12 LAB — BASIC METABOLIC PANEL WITH GFR
Anion gap: 9 (ref 5–15)
BUN: 14 mg/dL (ref 8–23)
CO2: 26 mmol/L (ref 22–32)
Calcium: 8.5 mg/dL — ABNORMAL LOW (ref 8.9–10.3)
Chloride: 102 mmol/L (ref 98–111)
Creatinine, Ser: 0.63 mg/dL (ref 0.44–1.00)
GFR, Estimated: >60 mL/min (ref >60)
Glucose, Bld: 141 mg/dL — ABNORMAL HIGH (ref 70–99)
Potassium: 4 mmol/L (ref 3.5–5.1)
Sodium: 137 mmol/L (ref 135–145)

## 2024-02-12 NOTE — Progress Notes (Signed)
 PROGRESS NOTE    Jaime Nichols  FMW:[id number] DOB: Sep 07, 1960 DOA: 02/07/2024 PCP: Aisha Harvey, MD   Brief Narrative: 63 year old with past medical history significant for CAD, chronic diastolic heart failure, diabetes type 2, hyperlipidemia, hypertension, morbid obesity, OSA on CPAP, PVCs, bilateral nephrolithiasis status post left ureteral stent placement on 01/01/2024 recurrent UTI currently she was taking Bactrim  and ciprofloxacin  presents for evaluation of fever chills, nausea dysuria, since she had left ureteral stent placed 5 weeks ago she has had persistent urinary tract infection.  She completed 10 days of Levaquin without significant improvement.  Patient presents with fever, tachycardia white blood cell 19 UA with more than 50 white blood cell.  CT renal stone study showed stable left ureteral stent, stable mild left-sided hydronephrosis, unchanged 3 mm calculus in the proximal left ureter unchanged left perinephric stranding. Patient was admitted for further management of infectious process.   63 year old with past medical history significant for pyelonephritis in the setting of left ureteral stent, right side nonobstructive stone, she was planning for ureteroscopy on 8/21st but this was canceled by urology due to patient's current hospital admission for sepsis and pyelonephritis   Assessment & Plan:   Principal Problem:   Sepsis secondary to UTI [facility]) Active Problems:   Bilateral nephrolithiasis   Acute cystitis with hematuria   Sepsis (HCC)   1-Sepsis due to UTI, left pyelonephritis/hydronephrosis with left ureteral stent in place: Bilateral nephrolithiasis: -Patient presents with persistent fever, dysuria, UA with more than 50 white blood cell. -Urine culture: 80,000 colonies Klebsiella pneumoniae, ESBL, sensitive to meropenem . -Blood cultures: No growth to date. -Continue IV meropenem  -Urology planning URS with LL on Tuesday 8/26 -Continue Flomax  WBC  trending down, Afebrile last 24 hours.  ID consulted, Urology recommending 14 days antibiotics.    Diabetes type 2: -Continue sliding scale insulin   Heart failure preserved ejection fraction chronic: - Continue metoprolol , lisinopril , cardizem   Hypertension: Continue Cardizem , lisinopril  and metoprolol .  Gout: - Continue allopurinol .  OSA: - Continue BiPAP CPAP at bedtime  Type +3 obesity Need lifestyle modification  Anemia:  Start folic acid , ferrous sulfate .  Denies BM , no melena Mildly decreased Hb at 11.   Constipation; Continue with  Laxatives.   Estimated body mass index is 43.2 kg/m as calculated from the following:   Height as of this encounter: 5' 2.5 (1.588 m).   Weight as of this encounter: 108.9 kg.   DVT prophylaxis: Lovenox  Code Status: Full code Family Communication: Care discussed with patient Disposition Plan:  Status is: Inpatient Remains inpatient appropriate because: management of  UTI    Consultants:  Urology   Procedures:  none  Antimicrobials:    Subjective: Alert, feels better. Sitting bed. She wants to know who will she need to follow up for infection at discharge. She should follow up with urology   Objective: Vitals:   02/11/24 0513 02/11/24 1400 02/11/24 2048 02/12/24 0537  BP: (!) 141/71 (!) 120/90 (!) 141/77 130/62  Pulse: 80 90 86 87  Resp: 20  19 18   Temp: 99.1 F (37.3 C) 97.9 F (36.6 C) 98 F (36.7 C) 98.1 F (36.7 C)  TempSrc: Oral Oral Oral Oral  SpO2: 97% 100% 96% 96%  Weight:      Height:        Intake/Output Summary (Last 24 hours) at 02/12/2024 0817 Last data filed at 02/12/2024 0600 Gross per 24 hour  Intake 904.49 ml  Output --  Net 904.49 ml   [employer]  02/07/24 1822  Weight: 108.9 kg    Examination:  General exam: NAD Respiratory system: CTA Cardiovascular system: S 1, S 2 RRR Gastrointestinal system: BS present, soft, nt Central nervous system: Alert Extremities: Symmetric 5  x 5 power.    Data Reviewed: I have personally reviewed following labs and imaging studies  CBC: Recent Labs  Lab 02/07/24 1845 02/08/24 0418 02/09/24 0356 02/10/24 0435 02/11/24 0421 02/12/24 0428  WBC 19.5* 16.4* 10.6* 5.7 6.3 7.6  NEUTROABS 17.1*  --   --   --   --   --   HGB 13.5 11.4* 11.2* 11.0* 11.2* 11.4*  HCT 41.9 36.1 35.5* 36.0 34.7* 35.3*  MCV 86.7 88.0 91.3 91.6 90.1 90.5  PLT 284 232 230 239 226 242   Basic Metabolic Panel: Recent Labs  Lab 02/08/24 0418 02/09/24 0356 02/10/24 0435 02/11/24 0421 02/12/24 0428  NA 134* 135 138 139 137  K 4.3 4.0 4.3 4.4 4.0  CL 104 104 102 101 102  CO2 21* 25 27 28 26   GLUCOSE 232* 149* 144* 163* 141*  BUN 18 17 16 15 14   CREATININE 0.90 0.99 0.76 0.75 0.63  CALCIUM  8.7* 8.7* 8.9 9.0 8.5*  MG  --  2.0 1.8 1.7  --   PHOS  --  2.8 2.8  --   --    GFR: Estimated Creatinine Clearance: 83.3 mL/min (by C-G formula based on SCr of 0.63 mg/dL). Liver Function Tests: Recent Labs  Lab 02/07/24 1845 02/09/24 0356 02/10/24 0435  AST 13* 22 24  ALT 14 20 27   ALKPHOS 101 87 80  BILITOT 1.1 0.4 0.6  PROT 7.8 6.5 6.3*  ALBUMIN 4.0 3.1* 2.9*   No results for input(s): LIPASE, AMYLASE in the last 168 hours. No results for input(s): AMMONIA in the last 168 hours. Coagulation Profile: Recent Labs  Lab 02/07/24 1845  INR 1.1   Cardiac Enzymes: No results for input(s): CKTOTAL, CKMB, CKMBINDEX, TROPONINI in the last 168 hours. BNP (last 3 results) No results for input(s): PROBNP in the last 8760 hours. HbA1C: No results for input(s): HGBA1C in the last 72 hours. CBG: Recent Labs  Lab 02/11/24 0745 02/11/24 1158 02/11/24 1700 02/11/24 2046 02/12/24 0739  GLUCAP 126* 166* 143* 135* 133*   Lipid Profile: No results for input(s): CHOL, HDL, LDLCALC, TRIG, CHOLHDL, LDLDIRECT in the last 72 hours. Thyroid  Function Tests: No results for input(s): TSH, T4TOTAL, FREET4, T3FREE,  THYROIDAB in the last 72 hours. Anemia Panel: No results for input(s): VITAMINB12, FOLATE, FERRITIN, TIBC, IRON, RETICCTPCT in the last 72 hours. Sepsis Labs: Recent Labs  Lab 02/07/24 1856  LATICACIDVEN 1.5    Recent Results (from the past 240 hours)  Culture, blood (Routine x 2)     Status: None (Preliminary result)   Collection Time: 02/07/24  6:45 PM   Specimen: BLOOD LEFT HAND  Result Value Ref Range Status   Specimen Description   Final    BLOOD LEFT HAND Performed at [facility], [address] [place], [place] [address]    Special Requests   Final    BOTTLES DRAWN AEROBIC AND ANAEROBIC Blood Culture adequate volume Performed at [facility], [address], [place], [place] [address]    Culture   Final    NO GROWTH 4 DAYS Performed at [facility], [address], [place], [place] [address]    Report Status PENDING  Incomplete  Urine Culture     Status: Abnormal  Collection Time: 02/07/24  7:20 PM   Specimen: Urine, Clean Catch  Result Value Ref Range Status   Specimen Description   Final    URINE, CLEAN CATCH Performed at [facility], [address], [place], [place] [address]    Special Requests   Final    NONE Performed at [facility], [address] [place], [place] [address]    Culture (A)  Final    80,000 COLONIES/mL KLEBSIELLA PNEUMONIAE Confirmed Extended Spectrum Beta-Lactamase Producer (ESBL).  In bloodstream infections from ESBL organisms, carbapenems are preferred over piperacillin/tazobactam. They are shown to have a lower risk of mortality.    Report Status 02/09/2024 FINAL  Final   Organism ID, Bacteria KLEBSIELLA PNEUMONIAE (A)  Final      Susceptibility   Klebsiella pneumoniae - MIC*    AMPICILLIN >=32 RESISTANT Resistant     CEFAZOLIN  (URINE) Value in next row Resistant      >=32 RESISTANTThis is a modified FDA-approved test that has been validated and  its performance characteristics determined by the reporting laboratory.  This laboratory is certified under the Clinical Laboratory Improvement Amendments CLIA as qualified to perform high complexity clinical laboratory testing.    CEFEPIME  Value in next row Resistant      >=32 RESISTANTThis is a modified FDA-approved test that has been validated and its performance characteristics determined by the reporting laboratory.  This laboratory is certified under the Clinical Laboratory Improvement Amendments CLIA as qualified to perform high complexity clinical laboratory testing.    ERTAPENEM  Value in next row Sensitive      >=32 RESISTANTThis is a modified FDA-approved test that has been validated and its performance characteristics determined by the reporting laboratory.  This laboratory is certified under the Clinical Laboratory Improvement Amendments CLIA as qualified to perform high complexity clinical laboratory testing.    CEFTRIAXONE  Value in next row Resistant      >=32 RESISTANTThis is a modified FDA-approved test that has been validated and its performance characteristics determined by the reporting laboratory.  This laboratory is certified under the Clinical Laboratory Improvement Amendments CLIA as qualified to perform high complexity clinical laboratory testing.    CIPROFLOXACIN  Value in next row Resistant      >=32 RESISTANTThis is a modified FDA-approved test that has been validated and its performance characteristics determined by the reporting laboratory.  This laboratory is certified under the Clinical Laboratory Improvement Amendments CLIA as qualified to perform high complexity clinical laboratory testing.    GENTAMICIN  Value in next row Resistant      >=32 RESISTANTThis is a modified FDA-approved test that has been validated and its performance characteristics determined by the reporting laboratory.  This laboratory is certified under the Clinical Laboratory Improvement Amendments CLIA as  qualified to perform high complexity clinical laboratory testing.    NITROFURANTOIN Value in next row Resistant      >=32 RESISTANTThis is a modified FDA-approved test that has been validated and its performance characteristics determined by the reporting laboratory.  This laboratory is certified under the Clinical Laboratory Improvement Amendments CLIA as qualified to perform high complexity clinical laboratory testing.    TRIMETH /SULFA  Value in next row Resistant      >=32 RESISTANTThis is a modified FDA-approved test that has been validated and its performance characteristics determined by the reporting laboratory.  This laboratory is certified under the Clinical Laboratory Improvement Amendments CLIA as qualified to perform high complexity clinical laboratory testing.    AMPICILLIN/SULBACTAM Value in next  row Resistant      >=32 RESISTANTThis is a modified FDA-approved test that has been validated and its performance characteristics determined by the reporting laboratory.  This laboratory is certified under the Clinical Laboratory Improvement Amendments CLIA as qualified to perform high complexity clinical laboratory testing.    PIP/TAZO Value in next row Intermediate ug/mL     64 INTERMEDIATEThis is a modified FDA-approved test that has been validated and its performance characteristics determined by the reporting laboratory.  This laboratory is certified under the Clinical Laboratory Improvement Amendments CLIA as qualified to perform high complexity clinical laboratory testing.    MEROPENEM  Value in next row Sensitive      64 INTERMEDIATEThis is a modified FDA-approved test that has been validated and its performance characteristics determined by the reporting laboratory.  This laboratory is certified under the Clinical Laboratory Improvement Amendments CLIA as qualified to perform high complexity clinical laboratory testing.    * 80,000 COLONIES/mL KLEBSIELLA PNEUMONIAE  Culture, blood (Routine x  2)     Status: None (Preliminary result)   Collection Time: 02/07/24  7:58 PM   Specimen: BLOOD  Result Value Ref Range Status   Specimen Description   Final    BLOOD BLOOD RIGHT HAND Performed at [facility], [address], [place], [place] [address]    Special Requests   Final    Blood Culture adequate volume BOTTLES DRAWN AEROBIC AND ANAEROBIC Performed at [facility], [address], [place], [place] [address]    Culture   Final    NO GROWTH 3 DAYS Performed at [facility], [address], [place], [place] [address]    Report Status PENDING  Incomplete         Radiology Studies: No results found.      Scheduled Meds:  allopurinol   300 mg Oral Daily   diltiazem   120 mg Oral Daily   enoxaparin  (LOVENOX ) injection  40 mg Subcutaneous Q24H   feeding supplement  237 mL Oral BID BM   ferrous sulfate   325 mg Oral Q breakfast   insulin  aspart  0-15 Units Subcutaneous TID WC   insulin  aspart  0-5 Units Subcutaneous QHS   lisinopril   10 mg Oral Daily   metoprolol  succinate  25 mg Oral Daily   polyethylene glycol  17 g Oral Daily   potassium citrate   10 mEq Oral Once per day on Monday Tuesday Wednesday Thursday Friday   senna-docusate  1 tablet Oral Daily   tamsulosin   0.4 mg Oral QPC supper   Continuous Infusions:  meropenem  (MERREM ) IV 1 g (02/12/24 0125)     LOS: 5 days    Time spent: 35 Minutes    Dorothyann Mourer A Verl Kitson, MD [facility]   If 7PM-7AM, please contact night-coverage [email or web address]  02/12/2024, 8:17 AM

## 2024-02-12 NOTE — Progress Notes (Signed)
 OT Cancellation Note  Patient Details Name: Jaime Nichols MRN: [id number] DOB: 31-[name]-1961   Cancelled Treatment:    Reason Eval/Treat Not Completed: OT screened, no needs identified, will sign off Per PT, pt at baseline for mobility and has been independent in room. Declines need for skilled therapy at this time. OT to complete orders.  Aarohi Redditt L. Lakeva Hollon, OTR/L  02/12/24, 4:48 PM

## 2024-02-12 NOTE — Progress Notes (Signed)
 PT Cancellation Note  Patient Details Name: Jaime Nichols MRN: [id number] DOB: Sep 07, 1960   Cancelled Treatment:    Reason Eval/Treat Not Completed: PT screened, no needs identified, will sign off Noting in nursing documentation pt independent ambulating in room.  At arrival, pt up and ambulating and rearranging her flowers.  Demonstrates good balance and mobility.  Declines need for PT.  Will sign off.  Alem Fahl, PT Acute Rehab [facility] [phone]   Jaime Nichols 02/12/2024, 4:36 PM

## 2024-02-12 NOTE — Consult Note (Addendum)
 [facility]    Date of Admission:  02/07/2024   Total days of inpatient antibiotics 5        Reason for Consult: Complicated UTI   Principal Problem:   Sepsis secondary to UTI [facility]) Active Problems:   Bilateral nephrolithiasis   Acute cystitis with hematuria   Sepsis [facility])   Assessment: 63 year old female with past medical history of [place]il [place]  gastroenteritis, diabetes type 2, hyperlipidemia, OSA on CPAP, PVCs bilateral nephrolithiasis supposed stent placement to the left ureter on 01/01/2024, current UTIs which was most recently treated with Levaquin then on Bactrim  present while flank pain found to have #Pyelonephritis in the setting of bilateral renal calculi #Recent history of left ureteral stent placement - Urine cultures grew Klebsiella pneumonia ESBL.  Patient is on meropenem  - Blood cultures remain negative - Urology engaged plan for OR for URS tomorrow in the setting of bilateral stones.  Recommendations:  -Place PICC line following OR  - Follow or findings - Plan on at least 2 weeks antibiotics tomorrow.  Discussed with patient - Contact precautions  Microbiology:   Antibiotics: Meropenem  8/20-present  Cultures: Blood 8/20 no growth Urine 8/20 ESBL Klebsiella pneumoniae Other   HPI: Jaime Nichols is a 63 y.o. female history of gastroenteritis with Salmonella [place] , CAD, chronic systolic heart failure, diabetes type 2, hyperlipidemia, pretension, morbid obesity, OSA on CPAP, PVCs, bilateral nephrolithiasis status post left ureteral stent placed on 01/01/2024, recurrent UTIs most recently on Bactrim , presented with dysuria, flank pain and chills.  Had a temp of 10 2 in the AM.  On arrival she had a temp of 103.1, WBC 19K.  UA was positive for nitrites large leukocytes.  CT renal study showed stable left ureteral stone, mild left hydronephrosis, 3 mm calculus in proximal left ureter unchanged, unchanged left  perinephric stranding and fluid and nonobstructing bilateral renal colliculi.  Patient was admitted and started on meropenem .  Urology was engaged initially no plans for OR for pyelonephritis.  Plan for URS on Tuesday for bilateral stones.  ID engaged as urine cultures grew ESBL Klebsiella pneumonia.   Review of Systems: Review of Systems  All other systems reviewed and are negative.   Past Medical History:  Diagnosis Date   Aortic atherosclerosis (HCC)    Arthritis    CAD in native artery    very high calcium  score at 875.  coronary CTA  showed 30% RCA and LAD.   Chronic diastolic CHF (congestive heart failure) (HCC) 2012   Diabetes mellitus type 2 in obese    Dyspnea    with exertion   Fatty liver    Fibroid    History of kidney stones 09/2019   HTN (hypertension)    Hyperlipidemia    Morbid obesity (HCC)    OSA on CPAP    PAT (paroxysmal atrial tachycardia) (HCC)    Premature atrial contractions    PVC's (premature ventricular contractions)     Social History   Tobacco Use   Smoking status: Never   Smokeless tobacco: Never  Vaping Use   Vaping status: Never Used  Substance Use Topics   Alcohol use: Not Currently   Drug use: No    Family History  Problem Relation Age of Onset   Hypertension Mother    Heart disease Father        Died age 19s of a cardiac event - was told it was atherosclerosis but also had PVCs  Healthy Sister    Arrhythmia Sister        PVCs   Depression Brother    Healthy Sister    Diabetes Paternal Grandmother    Arrhythmia Other        Distant relative with WPW.   Scheduled Meds:  allopurinol   300 mg Oral Daily   diltiazem   120 mg Oral Daily   enoxaparin  (LOVENOX ) injection  40 mg Subcutaneous Q24H   feeding supplement  237 mL Oral BID BM   ferrous sulfate   325 mg Oral Q breakfast   insulin  aspart  0-15 Units Subcutaneous TID WC   insulin  aspart  0-5 Units Subcutaneous QHS   lisinopril   10 mg Oral Daily   metoprolol  succinate  25  mg Oral Daily   polyethylene glycol  17 g Oral Daily   potassium citrate   10 mEq Oral Once per day on Monday Tuesday Wednesday Thursday Friday   senna-docusate  1 tablet Oral Daily   tamsulosin   0.4 mg Oral QPC supper   Continuous Infusions:  meropenem  (MERREM ) IV 1 g (02/12/24 0848)   PRN Meds:.acetaminophen  **OR** acetaminophen , bisacodyl , ondansetron  **OR** ondansetron  (ZOFRAN ) IV, senna-docusate Allergies  Allergen Reactions   Erythromycin Diarrhea and Nausea And Vomiting    Other reaction(s): stomach upset   Latex Other (See Comments)    REACTION: CONTACT DERMATITIS   Nickel Other (See Comments)    Poor healing- dental reasons   Statins Other (See Comments)    Joint pain   Crestor  [Rosuvastatin ] Other (See Comments)    Cannot take when taking Colchicine   Dapagliflozin     Other reaction(s): worsening of urine incontinence   Hydrochlorothiazide     Previously discontinued due to uric acid kidney stones   Metformin  Hcl Diarrhea and Other (See Comments)    Diarrhea at higher doses, GI Upset   Potassium Citrate  Other (See Comments)    Affected eyes- redness and crusty at 30 mEq/daily; pt states she has no reaction to the potassium citrate  that she is taking now and that this only happened when she was taking too much   Zocor [Simvastatin] Other (See Comments)    Joint pain    OBJECTIVE: Blood pressure 118/62, pulse 72, temperature 98.9 F (37.2 C), temperature source Oral, resp. rate 20, height 5' 2.5 (1.588 m), weight 108.9 kg, SpO2 95%.  Physical Exam Constitutional:      Appearance: Normal appearance.  HENT:     Head: Normocephalic and atraumatic.     Right Ear: Tympanic membrane normal.     Left Ear: Tympanic membrane normal.     Nose: Nose normal.     Mouth/Throat:     Mouth: Mucous membranes are moist.  Eyes:     Extraocular Movements: Extraocular movements intact.     Conjunctiva/sclera: Conjunctivae normal.     Pupils: Pupils are equal, round, and  reactive to light.  Cardiovascular:     Rate and Rhythm: Normal rate and regular rhythm.     Heart sounds: No murmur heard.    No friction rub. No gallop.  Pulmonary:     Effort: Pulmonary effort is normal.     Breath sounds: Normal breath sounds.  Abdominal:     General: Abdomen is flat.     Palpations: Abdomen is soft.  Musculoskeletal:        General: Normal range of motion.  Skin:    General: Skin is warm and dry.  Neurological:     General: No focal deficit present.  Mental Status: She is alert and oriented to person, place, and time.  Psychiatric:        Mood and Affect: Mood normal.     Lab Results Lab Results  Component Value Date   WBC 7.6 02/12/2024   HGB 11.4 (L) 02/12/2024   HCT 35.3 (L) 02/12/2024   MCV 90.5 02/12/2024   PLT 242 02/12/2024    Lab Results  Component Value Date   CREATININE 0.63 02/12/2024   BUN 14 02/12/2024   NA 137 02/12/2024   K 4.0 02/12/2024   CL 102 02/12/2024   CO2 26 02/12/2024    Lab Results  Component Value Date   ALT 27 02/10/2024   AST 24 02/10/2024   ALKPHOS 80 02/10/2024   BILITOT 0.6 02/10/2024       Loney Stank, MD [facility] 02/12/2024, 1:33 PM Evaluation of this patient requires complex antimicrobial therapy evaluation and counseling + isolation needs for disease transmission risk assessment and mitigation

## 2024-02-12 NOTE — Progress Notes (Signed)
  Subjective: Patient doing well continues to be on meropenem  plan for ureteroscopy tomorrow.  Objective: Vital signs in last 24 hours: Temp:  [97.9 F (36.6 C)-98.1 F (36.7 C)] 98.1 F (36.7 C) (08/25 0537) Pulse Rate:  [86-90] 87 (08/25 0537) Resp:  [18-19] 18 (08/25 0537) BP: (120-141)/(62-90) 130/62 (08/25 0537) SpO2:  [96 %-100 %] 96 % (08/25 0537)  Intake/Output from previous day: 08/24 0701 - 08/25 0700 In: 904.5 [P.O.:480; IV Piggyback:424.5] Out: -  Intake/Output this shift: No intake/output data recorded.  Physical Exam:  General: Alert and oriented CV: RRR Lungs: Clear GU: No Foley Lab Results: Recent Labs    02/10/24 0435 02/11/24 0421 02/12/24 0428  HGB 11.0* 11.2* 11.4*  HCT 36.0 34.7* 35.3*   BMET Recent Labs    02/11/24 0421 02/12/24 0428  NA 139 137  K 4.4 4.0  CL 101 102  CO2 28 26  GLUCOSE 163* 141*  BUN 15 14  CREATININE 0.75 0.63  CALCIUM  9.0 8.5*     Studies/Results: US  EKG SITE RITE Result Date: 02/12/2024 If Site Rite image not attached, placement could not be confirmed due to current cardiac rhythm.   Assessment/Plan: 31 F w/ pyelonephritis in setting of left ureteral stent. Admitted to medicine. Right sided non obstructing stone.    # Pyelonephritis  - left stent in place  - no intervention from urology warranted - continue brad spectrum abx (cefepime  and meropenem ) - Cx growing MDRO Klebsiella on Meropenem     # Bilateral stones - planned of ureteroscopy 8/21- this was cancelled - URS planned for Tuesday make NPO at midnight tonight   LOS: 5 days   Jackey Pea MD 02/12/2024, 10:32 AM [facility]

## 2024-02-13 ENCOUNTER — Inpatient Hospital Stay (HOSPITAL_COMMUNITY)

## 2024-02-13 ENCOUNTER — Encounter (HOSPITAL_COMMUNITY)
Admission: EM | Disposition: A | Discharge status: Home Health | Payer: Self-pay | Source: Home / Self Care | Attending: Internal Medicine

## 2024-02-13 ENCOUNTER — Inpatient Hospital Stay (HOSPITAL_COMMUNITY): Payer: Self-pay | Admitting: Anesthesiology

## 2024-02-13 DIAGNOSIS — N390 Urinary tract infection, site not specified: Secondary | ICD-10-CM | POA: Diagnosis not present

## 2024-02-13 DIAGNOSIS — A419 Sepsis, unspecified organism: Secondary | ICD-10-CM | POA: Diagnosis not present

## 2024-02-13 DIAGNOSIS — N201 Calculus of ureter: Secondary | ICD-10-CM | POA: Diagnosis not present

## 2024-02-13 HISTORY — PX: CYSTOSCOPY/URETEROSCOPY/HOLMIUM LASER/STENT PLACEMENT: SHX6546

## 2024-02-13 LAB — CULTURE, BLOOD (ROUTINE X 2)
Culture: NO GROWTH
Special Requests: ADEQUATE

## 2024-02-13 LAB — BASIC METABOLIC PANEL WITH GFR
Anion gap: 10 (ref 5–15)
BUN: 18 mg/dL (ref 8–23)
CO2: 26 mmol/L (ref 22–32)
Calcium: 8.8 mg/dL — ABNORMAL LOW (ref 8.9–10.3)
Chloride: 102 mmol/L (ref 98–111)
Creatinine, Ser: 0.72 mg/dL (ref 0.44–1.00)
GFR, Estimated: >60 mL/min (ref >60)
Glucose, Bld: 146 mg/dL — ABNORMAL HIGH (ref 70–99)
Potassium: 4.1 mmol/L (ref 3.5–5.1)
Sodium: 138 mmol/L (ref 135–145)

## 2024-02-13 LAB — CBC
HCT: 35 % — ABNORMAL LOW (ref 36.0–46.0)
Hemoglobin: 11 g/dL — ABNORMAL LOW (ref 12.0–15.0)
MCH: 28.5 pg (ref 26.0–34.0)
MCHC: 31.4 g/dL (ref 30.0–36.0)
MCV: 90.7 fL (ref 80.0–100.0)
Platelets: 246 K/uL (ref 150–400)
RBC: 3.86 MIL/uL — ABNORMAL LOW (ref 3.87–5.11)
RDW: 13.4 % (ref 11.5–15.5)
WBC: 7.8 K/uL (ref 4.0–10.5)
nRBC: 0 % (ref 0.0–0.2)

## 2024-02-13 LAB — GLUCOSE, CAPILLARY
Glucose-Capillary: 139 mg/dL — ABNORMAL HIGH (ref 70–99)
Glucose-Capillary: 131 mg/dL — ABNORMAL HIGH (ref 70–99)
Glucose-Capillary: 121 mg/dL — ABNORMAL HIGH (ref 70–99)
Glucose-Capillary: 117 mg/dL — ABNORMAL HIGH (ref 70–99)
Glucose-Capillary: 204 mg/dL — ABNORMAL HIGH (ref 70–99)

## 2024-02-13 SURGERY — CYSTOSCOPY/URETEROSCOPY/HOLMIUM LASER/STENT PLACEMENT
Anesthesia: General | Laterality: Bilateral

## 2024-02-13 MED ORDER — OXYCODONE HCL 5 MG PO TABS
ORAL_TABLET | ORAL | Status: AC
  Filled 2024-02-13: qty 1

## 2024-02-13 MED ORDER — SODIUM CHLORIDE 0.9 % IR SOLN
Status: DC | PRN
  Administered 2024-02-13 (×2): 3000 mL

## 2024-02-13 MED ORDER — PHENYLEPHRINE 80 MCG/ML (10ML) SYRINGE FOR IV PUSH (FOR BLOOD PRESSURE SUPPORT)
PREFILLED_SYRINGE | INTRAVENOUS | Status: AC
  Filled 2024-02-13: qty 10

## 2024-02-13 MED ORDER — LACTATED RINGERS IV SOLN: INTRAVENOUS | Status: DC

## 2024-02-13 MED ORDER — DEXAMETHASONE SODIUM PHOSPHATE 4 MG/ML IJ SOLN
INTRAMUSCULAR | Status: DC | PRN
  Administered 2024-02-13: 8 mg via INTRAVENOUS

## 2024-02-13 MED ORDER — SODIUM CHLORIDE 0.9 % IV SOLN
1.0000 g | Freq: Three times a day (TID) | INTRAVENOUS | Status: DC
  Administered 2024-02-13 – 2024-02-16 (×9): 1 g via INTRAVENOUS
  Filled 2024-02-13 (×11): qty 20

## 2024-02-13 MED ORDER — FENTANYL CITRATE (PF) 100 MCG/2ML IJ SOLN
INTRAMUSCULAR | Status: AC
Start: 2024-02-13 — End: 2024-02-13
  Filled 2024-02-13: qty 2

## 2024-02-13 MED ORDER — OXYCODONE HCL 5 MG/5ML PO SOLN: 5.0000 mg | Freq: Once | ORAL | Status: AC | PRN

## 2024-02-13 MED ORDER — ACETAMINOPHEN 10 MG/ML IV SOLN
1000.0000 mg | Freq: Once | INTRAVENOUS | Status: AC
  Administered 2024-02-13: 1000 mg via INTRAVENOUS

## 2024-02-13 MED ORDER — MIDAZOLAM HCL 5 MG/5ML IJ SOLN
INTRAMUSCULAR | Status: DC | PRN
  Administered 2024-02-13 (×2): 1 mg via INTRAVENOUS

## 2024-02-13 MED ORDER — ETOMIDATE 2 MG/ML IV SOLN
INTRAVENOUS | Status: AC
  Filled 2024-02-13: qty 10

## 2024-02-13 MED ORDER — OXYCODONE HCL 5 MG PO TABS
5.0000 mg | ORAL_TABLET | Freq: Once | ORAL | Status: AC | PRN
  Administered 2024-02-13: 5 mg via ORAL

## 2024-02-13 MED ORDER — ACETAMINOPHEN 10 MG/ML IV SOLN
INTRAVENOUS | Status: AC
  Filled 2024-02-13: qty 100

## 2024-02-13 MED ORDER — DROPERIDOL 2.5 MG/ML IJ SOLN: 0.6250 mg | Freq: Once | INTRAMUSCULAR | Status: DC | PRN

## 2024-02-13 MED ORDER — MIDAZOLAM HCL 2 MG/2ML IJ SOLN
INTRAMUSCULAR | Status: AC
  Filled 2024-02-13: qty 2

## 2024-02-13 MED ORDER — DEXAMETHASONE SODIUM PHOSPHATE 10 MG/ML IJ SOLN
INTRAMUSCULAR | Status: AC
  Filled 2024-02-13: qty 1

## 2024-02-13 MED ORDER — FENTANYL CITRATE PF 50 MCG/ML IJ SOSY
PREFILLED_SYRINGE | INTRAMUSCULAR | Status: AC
Start: 2024-02-13 — End: 2024-02-13
  Filled 2024-02-13: qty 1

## 2024-02-13 MED ORDER — LIDOCAINE HCL (CARDIAC) PF 100 MG/5ML IV SOSY
PREFILLED_SYRINGE | INTRAVENOUS | Status: DC | PRN
  Administered 2024-02-13: 100 mg via INTRAVENOUS

## 2024-02-13 MED ORDER — PROPOFOL 10 MG/ML IV BOLUS
INTRAVENOUS | Status: DC | PRN
  Administered 2024-02-13: 170 mg via INTRAVENOUS

## 2024-02-13 MED ORDER — FENTANYL CITRATE PF 50 MCG/ML IJ SOSY
25.0000 ug | PREFILLED_SYRINGE | INTRAMUSCULAR | Status: DC | PRN
  Administered 2024-02-13: 50 ug via INTRAVENOUS

## 2024-02-13 MED ORDER — IOHEXOL 300 MG/ML  SOLN
INTRAMUSCULAR | Status: DC | PRN
  Administered 2024-02-13: 18 mL

## 2024-02-13 MED ORDER — FENTANYL CITRATE (PF) 100 MCG/2ML IJ SOLN
INTRAMUSCULAR | Status: DC | PRN
  Administered 2024-02-13: 25 ug via INTRAVENOUS
  Administered 2024-02-13: 50 ug via INTRAVENOUS
  Administered 2024-02-13: 25 ug via INTRAVENOUS

## 2024-02-13 MED ORDER — ONDANSETRON HCL 4 MG/2ML IJ SOLN
INTRAMUSCULAR | Status: DC | PRN
  Administered 2024-02-13: 4 mg via INTRAVENOUS

## 2024-02-13 MED ORDER — ACETAMINOPHEN 500 MG PO TABS: 1000.0000 mg | ORAL_TABLET | Freq: Once | ORAL | Status: DC

## 2024-02-13 SURGICAL SUPPLY — 26 items
BAG COUNTER SPONGE SURGICOUNT (BAG) IMPLANT
BAG URO CATCHER STRL LF (MISCELLANEOUS) ×2 IMPLANT
BASKET STONE 1.7 NGAGE (UROLOGICAL SUPPLIES) ×1 IMPLANT
BASKET ZERO TIP NITINOL 2.4FR (BASKET) IMPLANT
CATH URETERAL DUAL LUMEN 10F (MISCELLANEOUS) ×1 IMPLANT
CATH URETL OPEN 5X70 (CATHETERS) IMPLANT
CLOTH BEACON ORANGE TIMEOUT ST (SAFETY) ×1 IMPLANT
EXTRACTOR STONE 1.7FRX115CM (UROLOGICAL SUPPLIES) IMPLANT
FIBER LASER MOSES 200 DFL (Laser) ×1 IMPLANT
FIBER LASER MOSES 365 DFL (Laser) IMPLANT
GLOVE BIO SURGEON STRL SZ8 (GLOVE) ×1 IMPLANT
GLOVE SURG SYN 6.5 PF PI (GLOVE) ×1 IMPLANT
GLOVE SURG SYN 8.0 PF PI (GLOVE) ×1 IMPLANT
GOWN STRL REUS W/ TWL XL LVL3 (GOWN DISPOSABLE) ×2 IMPLANT
GUIDEWIRE ANG ZIPWIRE 038X150 (WIRE) IMPLANT
GUIDEWIRE STR DUAL SENSOR (WIRE) ×4 IMPLANT
KIT TURNOVER KIT A (KITS) ×2 IMPLANT
MANIFOLD NEPTUNE II (INSTRUMENTS) ×2 IMPLANT
NS IRRIG 1000ML POUR BTL (IV SOLUTION) IMPLANT
PACK CYSTO (CUSTOM PROCEDURE TRAY) ×2 IMPLANT
SHEATH NAVIGATOR HD 11/13X28 (SHEATH) ×1 IMPLANT
SHEATH NAVIGATOR HD 11/13X36 (SHEATH) ×2 IMPLANT
STENT URET 6FRX22 CONTOUR (STENTS) ×2 IMPLANT
TRACTIP FLEXIVA PULS ID 200XHI (Laser) IMPLANT
TUBING CONNECTING 10 (TUBING) ×2 IMPLANT
TUBING UROLOGY SET (TUBING) ×2 IMPLANT

## 2024-02-13 NOTE — Anesthesia Procedure Notes (Signed)
 Procedure Name: LMA Insertion Date/Time: 02/13/2024 5:44 PM  Performed by: Vincenzo Show, CRNAPre-anesthesia Checklist: Patient identified, Emergency Drugs available, Suction available, Patient being monitored and Timeout performed Patient Re-evaluated:Patient Re-evaluated prior to induction Oxygen Delivery Method: Circle system utilized Preoxygenation: Pre-oxygenation with 100% oxygen Induction Type: IV induction Ventilation: Mask ventilation without difficulty LMA: LMA with gastric port inserted LMA Size: 4.0 Number of attempts: 1 Placement Confirmation: positive ETCO2 and breath sounds checked- equal and bilateral Tube secured with: Tape Dental Injury: Teeth and Oropharynx as per pre-operative assessment  Comments: Easy pass LMA.

## 2024-02-13 NOTE — Transfer of Care (Signed)
 Immediate Anesthesia Transfer of Care Note  Patient: Jaime Nichols  Procedure(s) Performed: Procedure(s): BILATERAL URETEROSCOPY, HOLIUM LASER, RETROGRADE PYLOGRAM, STENT (Bilateral)  Patient Location: PACU  Anesthesia Type:General  Level of Consciousness:  sedated, patient cooperative and responds to stimulation  Airway & Oxygen Therapy:Patient Spontanous Breathing and Patient connected to face mask oxgen  Post-op Assessment:  Report given to PACU RN and Post -op Vital signs reviewed and stable  Post vital signs:  Reviewed and stable  Last Vitals:  Vitals:   02/13/24 0454 02/13/24 1345  BP: 117/64 133/65  Pulse: 77 80  Resp: 17   Temp: 36.9 C 37.1 C  SpO2: 96% 96%    Complications: No apparent anesthesia complications

## 2024-02-13 NOTE — TOC Initial Note (Signed)
 Transition of Care [facility]) - Initial/Assessment Note    Patient Details  Name: Jaime Nichols MRN: [id number] Date of Birth: Dec 05, 1960  Transition of Care [facility] CM/SW Contact:    Jaime CHRISTELLA Eva, LCSW Phone Number: 02/13/2024, 11:39 AM  Clinical Narrative:                  CSW received a consult for home IV antibiotic  arrangements. A referral was sent to [facility]. Pam will follow up with the pt to discuss home IV antibiotics and home health services. The pt reports no DME or transportation needs at this time. IP Care Management to follow for d/c needs.  Expected Discharge Plan: Home w Home Health Services Barriers to Discharge: Continued Medical Work up   Patient Goals and CMS Choice Patient states their goals for this hospitalization and ongoing recovery are:: home with home health          Expected Discharge Plan and Services       Living arrangements for the past 2 months: Single Family Home                                      Prior Living Arrangements/Services Living arrangements for the past 2 months: Single Family Home Lives with:: Self Patient language and need for interpreter reviewed:: Yes Do you feel safe going back to the place where you live?: Yes      Need for Family Participation in Patient Care: No (Comment) Care giver support system in place?: No (comment)   Criminal Activity/Legal Involvement Pertinent to Current Situation/Hospitalization: No - Comment as needed  Activities of Daily Living   ADL Screening (condition at time of admission) Independently performs ADLs?: Yes (appropriate for developmental age) Is the patient deaf or have difficulty hearing?: No Does the patient have difficulty seeing, even when wearing glasses/contacts?: No Does the patient have difficulty concentrating, remembering, or making decisions?: No  Permission Sought/Granted                  Emotional Assessment Appearance::  Appears stated age, Well-Groomed Attitude/Demeanor/Rapport: Gracious Affect (typically observed): Accepting Orientation: : Oriented to Self, Oriented to Place, Oriented to  Time, Oriented to Situation   Psych Involvement: No (comment)  Admission diagnosis:  Sepsis secondary to UTI (HCC) [A41.9, N39.0] Sepsis, due to unspecified organism, unspecified whether acute organ dysfunction present [facility]) [A41.9] Patient Active Problem List   Diagnosis Date Noted   Acute cystitis with hematuria 02/07/2024   Sepsis (HCC) 02/07/2024   Diarrhea 06/28/2023   Yeast UTI 08/04/2021   Complicated UTI (urinary tract infection) 08/01/2021   Hepatic steatosis 08/01/2021   Uncontrolled type 2 diabetes mellitus with hyperglycemia, without long-term current use of insulin  (HCC) 08/01/2021   H/O lithotripsy 08/01/2021   Sepsis secondary to UTI (HCC) 07/02/2021   Bilateral nephrolithiasis 07/02/2021   Coronary artery calcification seen on CAT scan    PAT (paroxysmal atrial tachycardia) (HCC) 05/24/2018   OSA on CPAP    Chronic diastolic CHF (congestive heart failure) (HCC)    HTN (hypertension) 04/22/2014   Morbid obesity (HCC) 12/10/2013   Hyperlipidemia    PECTUS EXCAVATUM 01/31/2008   DYSPNEA 01/31/2008   PCP:  Aisha Harvey, MD Pharmacy:   [id number] [address] [place],  - [address] [place] [address] Phone: [phone] Fax: [phone]     Social Drivers of Health (SDOH) Social  History: SDOH Screenings   Food Insecurity: No Food Insecurity (02/08/2024)  Housing: Low Risk  (02/08/2024)  Transportation Needs: No Transportation Needs (02/08/2024)  Utilities: Not At Risk (02/08/2024)  Social Connections: Unknown (10/31/2021)   Received from [facility]  Tobacco Use: Low Risk  (02/07/2024)   SDOH Interventions:     Readmission Risk Interventions     No data to display

## 2024-02-13 NOTE — Plan of Care (Signed)
  Problem: Metabolic: Goal: Ability to maintain appropriate glucose levels will improve 02/13/2024 0738 by Leontine Bone, RN Outcome: Progressing 02/13/2024 0737 by Leontine Bone, RN Outcome: Progressing   Problem: Clinical Measurements: Goal: Ability to maintain clinical measurements within normal limits will improve 02/13/2024 0738 by Leontine Bone, RN Outcome: Progressing 02/13/2024 0737 by Leontine Bone, RN Outcome: Progressing Goal: Diagnostic test results will improve 02/13/2024 0738 by Leontine Bone, RN Outcome: Progressing 02/13/2024 0737 by Leontine Bone, RN Outcome: Progressing Goal: Respiratory complications will improve 02/13/2024 0738 by Leontine Bone, RN Outcome: Progressing 02/13/2024 0737 by Leontine Bone, RN Outcome: Progressing Goal: Cardiovascular complication will be avoided 02/13/2024 0738 by Leontine Bone, RN Outcome: Progressing 02/13/2024 0737 by Leontine Bone, RN Outcome: Progressing   Problem: Elimination: Goal: Will not experience complications related to bowel motility Outcome: Progressing Goal: Will not experience complications related to urinary retention Outcome: Progressing

## 2024-02-13 NOTE — Anesthesia Preprocedure Evaluation (Addendum)
 Anesthesia Evaluation  Patient identified by MRN, date of birth, ID band Patient awake    Reviewed: Allergy & Precautions, NPO status , Patient's Chart, lab work & pertinent test results, reviewed documented beta blocker date and time   History of Anesthesia Complications Negative for: history of anesthetic complications  Airway Mallampati: II  TM Distance: >3 FB Neck ROM: Full    Dental no notable dental hx.    Pulmonary sleep apnea and Continuous Positive Airway Pressure Ventilation    Pulmonary exam normal        Cardiovascular hypertension, Pt. on home beta blockers and Pt. on medications + CAD and +CHF  Normal cardiovascular exam+ dysrhythmias (PAT, PVCs)   Echo 01/15/24: EF 55-60%, grade II DD, valves ok   Neuro/Psych negative neurological ROS     GI/Hepatic negative GI ROS, Neg liver ROS,,,  Endo/Other  diabetes, Type 2, Oral Hypoglycemic Agents  Class 3 obesity  Renal/GU BILATERAL URETERAL STONE  negative genitourinary   Musculoskeletal  (+) Arthritis ,    Abdominal   Peds  Hematology  (+) Blood dyscrasia (Hgb 11.0), anemia   Anesthesia Other Findings Day of surgery medications reviewed with patient.  Reproductive/Obstetrics                              Anesthesia Physical Anesthesia Plan  ASA: 3  Anesthesia Plan: General   Post-op Pain Management: Tylenol  PO (pre-op)*   Induction: Intravenous  PONV Risk Score and Plan: 3 and Treatment may vary due to age or medical condition, Ondansetron , Dexamethasone  and Midazolam   Airway Management Planned: LMA  Additional Equipment: None  Intra-op Plan:   Post-operative Plan: Extubation in OR  Informed Consent: I have reviewed the patients History and Physical, chart, labs and discussed the procedure including the risks, benefits and alternatives for the proposed anesthesia with the patient or authorized representative who has  indicated his/her understanding and acceptance.     Dental advisory given  Plan Discussed with: CRNA  Anesthesia Plan Comments:          Anesthesia Quick Evaluation

## 2024-02-13 NOTE — Progress Notes (Signed)
     Subjective: NAEON.  Patient was sleeping with CPAP on rounds.  She was easily awoken.  Unfortunately it is in the hospital, but nice to see her again.  She was pleasant as usual. Reviewed case and plan.  Objective: Vital signs in last 24 hours: Temp:  [98.1 F (36.7 C)-98.9 F (37.2 C)] 98.4 F (36.9 C) (08/26 0454) Pulse Rate:  [72-82] 77 (08/26 0454) Resp:  [17-20] 17 (08/26 0454) BP: (117-128)/(62-66) 117/64 (08/26 0454) SpO2:  [95 %-96 %] 96 % (08/26 0454) FiO2 (%):  [21 %] 21 % (08/26 0000)  Assessment/Plan: 37 F w/ pyelonephritis in setting of left ureteral stent. Admitted to medicine. Right sided non obstructing stone.    # Pyelonephritis  - left stent in place  - continue brad spectrum abx (cefepime  and meropenem ) - Labs and VSS.  Afebrile - Cx growing MDRO Klebsiella on Meropenem     # Bilateral stones - planned of ureteroscopy 8/21- this was cancelled - URS planned for today.  Remain NPO.   Intake/Output from previous day: 08/25 0701 - 08/26 0700 In: 100 [IV Piggyback:100] Out: -   Intake/Output this shift: No intake/output data recorded.  Physical Exam:  General: Alert and oriented CV: No cyanosis Lungs: equal chest rise Abdomen: Soft, NTND, no rebound or guarding   Lab Results: Recent Labs    02/11/24 0421 02/12/24 0428 02/13/24 0405  HGB 11.2* 11.4* 11.0*  HCT 34.7* 35.3* 35.0*   BMET Recent Labs    02/12/24 0428 02/13/24 0405  NA 137 138  K 4.0 4.1  CL 102 102  CO2 26 26  GLUCOSE 141* 146*  BUN 14 18  CREATININE 0.63 0.72  CALCIUM  8.5* 8.8*  HGB 11.4* 11.0*  WBC 7.6 7.8     Studies/Results: US  EKG SITE RITE Result Date: 02/12/2024 If Site Rite image not attached, placement could not be confirmed due to current cardiac rhythm.     LOS: 6 days   Ole Bourdon, NP [facility] Pager: [phone]  02/13/2024, 11:05 AM

## 2024-02-13 NOTE — Op Note (Signed)
 Preoperative diagnosis: bilateral ureteral calculus  Postoperative diagnosis: bilateral ureteral calculus  Procedure:  Cystoscopy bilateral ureteroscopy and stone removal Ureteroscopic laser lithotripsy bilateral 32F x 22 ureteral stent placement patient(right stent without strings, right stent with strings.) bilateral retrograde pyelography with interpretation  Surgeon: Dr. Steffan Pea  Anesthesia: General  Complications: None  Intraoperative findings:  Left ureteral stone without complication significant swelling location of the stone Surgeon impaction stent left without strings. Right ureteral stone tucked into the abnormal calyx quite significant dusted as well.  The left lower extremity most also greater than 2 mm removed.  Left retrograde pyelogram interpretation: Narrowing at the location of the previous stone no contrast extravasation within the ureter to the renal pelvis.  Right retrograde pyelogram: No filling defects in the renal pelvis no contrast extravasation no ureteral narrowing.  EBL: Minimal  Specimens: bilateral ureteral calculus  Disposition of specimens: [facility] for stone analysis  Indication: Jaime Nichols is a 63 y.o.   patient with a history of left obstructive pyelonephritis admitted for pyelonephritis after stenting previously.  She has a nonobstructing right stone within the renal pelvis that likely.  In the future as patient has had ureteral organisms growing currently.  Has not been on meropenem .. After reviewing the management options for treatment, the patient elected to proceed with the above surgical procedure(s). We have discussed the potential benefits and risks of the procedure, side effects of the proposed treatment, the likelihood of the patient achieving the goals of the procedure, and any potential problems that might occur during the procedure or recuperation. Informed consent has been obtained.   Description  of procedure:  The patient was taken to the operating room and general anesthesia was induced.  The patient was placed in the dorsal lithotomy position, prepped and draped in the usual sterile fashion, and preoperative antibiotics were administered. A preoperative time-out was performed.   Cystourethroscopy was performed.  The patient's urethra was examined and was normal.  . Pan cystoscopy was performed and the bladder systematically examined in its entirety. There was no evidence for any bladder tumors, stones, or other mucosal pathology.    Attention then turned to the left ureteral orifice.  A 0.38 sensor guidewire was then advanced up the left ureter into the renal pelvis under fluoroscopic guidance. The 6 Fr semirigid ureteroscope was then advanced into the ureter next to the guidewire and the calculus was identified.  Stone was significantly impacted   The stone was then fragmented with the 200 micron holmium laser fiber on a setting of 0.6 and frequency of 6 Hz.   All stones were then removed from the ureter with an N-gage nitinol basket.  Reinspection of the ureter revealed no remaining visible stones or fragments.   The pyelogram was performed.  Noting at the location of the stone and significant stone impaction.  Attention was then turned to the right ureter using the semirigid ureteroscope sensor was placed in the right ureter since semirigid ureteroscope removed using a dual-lumen catheter second sensor wires placed in the right ureter and a 28 cm 11-13 French access sheath was placed in the right ureter under direct fluoroscopic guidance encountering the stone in the renal pelvis after placing a access sheath with the dual-lumen flexible ureteroscope.  Stone was broken up a large fragment passed into a calyx in the anterior curvature that made the angle difficult for the ureteroscope to access removed stents to dust this the laser would not reach the area so a  basket was placed blindly  into this calyx and the stones were basketed out eventually all stones were able to be removed.  Pan pyeloscopy apposed and submitted demonstrate no stones greater than 2 mm in size within the renal pelvis.  The  the ureteroscope was then pulled just proximal to the UPJ and the retrograde pyelogram was done through the ureteroscope is demonstrating no filling defects no ureteral narrowing no abnormalities within the ureter.  The scope scope also visualize the ureter with removal of the sheath there were no abnormalities no defects no injury to the ureter.  After the scope was removed the cystoscope was then reintroduced and a stent without strings placed on the left side over the wire a 6 x 22 cm double-J stent without strings placed on the left side.  On the right side scope was removed using the existing wire in place a 6 x 22 double-J stent was placed with strings under fluoroscopic guidance for placement the kidney as well as the bladder were confirmed with fluoroscopy.  Bladder was drained stones were sent for sample patient is extubated Opyd sent to PACU in stable addition.  The bladder was then emptied and the procedure ended.  The patient appeared to tolerate the procedure well and without complications.  The patient was able to be awakened and transferred to the recovery unit in satisfactory condition.   Disposition: Patient will be admitted kept on meropenem  the next couple of days she will need outpatient: Meropenem  IV antibiotics.  Patient will go back on regular diet.

## 2024-02-13 NOTE — Progress Notes (Signed)
Pt wearing home CPAP.

## 2024-02-13 NOTE — Progress Notes (Signed)
 PROGRESS NOTE    Jaime Nichols  FMW:[id number] DOB: [birth year] DOA: 02/07/2024 PCP: Aisha Harvey, MD   Brief Narrative: 63 year old with past medical history significant for CAD, chronic diastolic heart failure, diabetes type 2, hyperlipidemia, hypertension, morbid obesity, OSA on CPAP, PVCs, bilateral nephrolithiasis status post left ureteral stent placement on 01/01/2024 recurrent UTI currently she was taking Bactrim  and ciprofloxacin  presents for evaluation of fever chills, nausea dysuria, since she had left ureteral stent placed 5 weeks ago she has had persistent urinary tract infection.  She completed 10 days of Levaquin without significant improvement.  Patient presents with fever, tachycardia white blood cell 19 UA with more than 50 white blood cell.  CT renal stone study showed stable left ureteral stent, stable mild left-sided hydronephrosis, unchanged 3 mm calculus in the proximal left ureter unchanged left perinephric stranding. Patient was admitted for further management of infectious process.   63 year old with past medical history significant for pyelonephritis in the setting of left ureteral stent, right side nonobstructive stone, she was planning for ureteroscopy on 8/21st but this was canceled by urology due to patient's current hospital admission for sepsis and pyelonephritis   Assessment & Plan:   Principal Problem:   Sepsis secondary to UTI [facility]) Active Problems:   Bilateral nephrolithiasis   Acute cystitis with hematuria   Sepsis (HCC)   1-Sepsis due to UTI, left pyelonephritis/hydronephrosis with left ureteral stent in place: Bilateral nephrolithiasis: -Patient presents with persistent fever, dysuria, UA with more than 50 white blood cell. -Urine culture: 80,000 colonies Klebsiella pneumoniae, ESBL, sensitive to meropenem . -Blood cultures: No growth to date. -Continue IV meropenem  -Urology planning URS with LL on Tuesday 8/26 -Continue Flomax  WBC  trending down, Afebrile last 24 hours.  ID consulted, Urology recommending 14 days antibiotics.  Stable, procedure today   Diabetes type 2: -Continue sliding scale insulin   Heart failure preserved ejection fraction chronic: - Continue metoprolol , lisinopril , cardizem   Hypertension: Continue Cardizem , lisinopril  and metoprolol .  Gout: - Continue allopurinol .  OSA: - Continue BiPAP CPAP at bedtime  Type +3 obesity Need lifestyle modification  Anemia:  Started folic acid , ferrous sulfate .  Denies BM , no melena Mildly decreased Hb at 11.   Constipation; Continue with  Laxatives.   Estimated body mass index is 43.2 kg/m as calculated from the following:   Height as of this encounter: 5' 2.5 (1.588 m).   Weight as of this encounter: 108.9 kg.   DVT prophylaxis: Lovenox  Code Status: Full code Family Communication: Care discussed with patient Disposition Plan:  Status is: Inpatient Remains inpatient appropriate because: management of  UTI    Consultants:  Urology   Procedures:  none  Antimicrobials:    Subjective: She was able to have BM. Denies worsening pain.    Objective: Vitals:   02/12/24 0537 02/12/24 1253 02/12/24 2058 02/13/24 0454  BP: 130/62 118/62 128/66 117/64  Pulse: 87 72 82 77  Resp: 18 20 18 17   Temp: 98.1 F (36.7 C) 98.9 F (37.2 C) 98.1 F (36.7 C) 98.4 F (36.9 C)  TempSrc: Oral Oral Oral Oral  SpO2: 96% 95% 96% 96%  Weight:      Height:        Intake/Output Summary (Last 24 hours) at 02/13/2024 1324 Last data filed at 02/13/2024 1300 Gross per 24 hour  Intake 0 ml  Output --  Net 0 ml   Filed Weights   02/07/24 1822  Weight: 108.9 kg    Examination:  General exam: NAD Respiratory system:  CTA Cardiovascular system: S 1, S 2 RRR Gastrointestinal system: BS present, soft, nt Central nervous system: ALert Extremities: Symmetric 5 x 5 power.    Data Reviewed: I have personally reviewed following labs and imaging  studies  CBC: Recent Labs  Lab 02/07/24 1845 02/08/24 0418 02/09/24 0356 02/10/24 0435 02/11/24 0421 02/12/24 0428 02/13/24 0405  WBC 19.5*   < > 10.6* 5.7 6.3 7.6 7.8  NEUTROABS 17.1*  --   --   --   --   --   --   HGB 13.5   < > 11.2* 11.0* 11.2* 11.4* 11.0*  HCT 41.9   < > 35.5* 36.0 34.7* 35.3* 35.0*  MCV 86.7   < > 91.3 91.6 90.1 90.5 90.7  PLT 284   < > 230 239 226 242 246   < > = values in this interval not displayed.   Basic Metabolic Panel: Recent Labs  Lab 02/09/24 0356 02/10/24 0435 02/11/24 0421 02/12/24 0428 02/13/24 0405  NA 135 138 139 137 138  K 4.0 4.3 4.4 4.0 4.1  CL 104 102 101 102 102  CO2 25 27 28 26 26   GLUCOSE 149* 144* 163* 141* 146*  BUN 17 16 15 14 18   CREATININE 0.99 0.76 0.75 0.63 0.72  CALCIUM  8.7* 8.9 9.0 8.5* 8.8*  MG 2.0 1.8 1.7  --   --   PHOS 2.8 2.8  --   --   --    GFR: Estimated Creatinine Clearance: 83.3 mL/min (by C-G formula based on SCr of 0.72 mg/dL). Liver Function Tests: Recent Labs  Lab 02/07/24 1845 02/09/24 0356 02/10/24 0435  AST 13* 22 24  ALT 14 20 27   ALKPHOS 101 87 80  BILITOT 1.1 0.4 0.6  PROT 7.8 6.5 6.3*  ALBUMIN 4.0 3.1* 2.9*   No results for input(s): LIPASE, AMYLASE in the last 168 hours. No results for input(s): AMMONIA in the last 168 hours. Coagulation Profile: Recent Labs  Lab 02/07/24 1845  INR 1.1   Cardiac Enzymes: No results for input(s): CKTOTAL, CKMB, CKMBINDEX, TROPONINI in the last 168 hours. BNP (last 3 results) No results for input(s): PROBNP in the last 8760 hours. HbA1C: No results for input(s): HGBA1C in the last 72 hours. CBG: Recent Labs  Lab 02/12/24 1129 02/12/24 1621 02/12/24 2054 02/13/24 0727 02/13/24 1116  GLUCAP 167* 185* 166* 139* 131*   Lipid Profile: No results for input(s): CHOL, HDL, LDLCALC, TRIG, CHOLHDL, LDLDIRECT in the last 72 hours. Thyroid  Function Tests: No results for input(s): TSH, T4TOTAL, FREET4,  T3FREE, THYROIDAB in the last 72 hours. Anemia Panel: No results for input(s): VITAMINB12, FOLATE, FERRITIN, TIBC, IRON, RETICCTPCT in the last 72 hours. Sepsis Labs: Recent Labs  Lab 02/07/24 1856  LATICACIDVEN 1.5    Recent Results (from the past 240 hours)  Culture, blood (Routine x 2)     Status: None   Collection Time: 02/07/24  6:45 PM   Specimen: BLOOD LEFT HAND  Result Value Ref Range Status   Specimen Description   Final    BLOOD LEFT HAND Performed at [facility], [address], [place], [place] [address]    Special Requests   Final    BOTTLES DRAWN AEROBIC AND ANAEROBIC Blood Culture adequate volume Performed at [facility], [address], [place], [place] [address]    Culture   Final    NO GROWTH 5 DAYS Performed at [facility], [address], [place], [place]  [address]    Report Status 02/12/2024 FINAL  Final  Urine Culture     Status: Abnormal   Collection Time: 02/07/24  7:20 PM   Specimen: Urine, Clean Catch  Result Value Ref Range Status   Specimen Description   Final    URINE, CLEAN CATCH Performed at [facility], [address] [place], [place] [address]    Special Requests   Final    NONE Performed at [facility], [address] [place], [place] [address]    Culture (A)  Final    80,000 COLONIES/mL KLEBSIELLA PNEUMONIAE Confirmed Extended Spectrum Beta-Lactamase Producer (ESBL).  In bloodstream infections from ESBL organisms, carbapenems are preferred over piperacillin/tazobactam. They are shown to have a lower risk of mortality.    Report Status 02/09/2024 FINAL  Final   Organism ID, Bacteria KLEBSIELLA PNEUMONIAE (A)  Final      Susceptibility   Klebsiella pneumoniae - MIC*    AMPICILLIN >=32 RESISTANT Resistant     CEFAZOLIN  (URINE) Value in next row Resistant      >=32 RESISTANTThis is a modified FDA-approved test that has been validated and its  performance characteristics determined by the reporting laboratory.  This laboratory is certified under the Clinical Laboratory Improvement Amendments CLIA as qualified to perform high complexity clinical laboratory testing.    CEFEPIME  Value in next row Resistant      >=32 RESISTANTThis is a modified FDA-approved test that has been validated and its performance characteristics determined by the reporting laboratory.  This laboratory is certified under the Clinical Laboratory Improvement Amendments CLIA as qualified to perform high complexity clinical laboratory testing.    ERTAPENEM  Value in next row Sensitive      >=32 RESISTANTThis is a modified FDA-approved test that has been validated and its performance characteristics determined by the reporting laboratory.  This laboratory is certified under the Clinical Laboratory Improvement Amendments CLIA as qualified to perform high complexity clinical laboratory testing.    CEFTRIAXONE  Value in next row Resistant      >=32 RESISTANTThis is a modified FDA-approved test that has been validated and its performance characteristics determined by the reporting laboratory.  This laboratory is certified under the Clinical Laboratory Improvement Amendments CLIA as qualified to perform high complexity clinical laboratory testing.    CIPROFLOXACIN  Value in next row Resistant      >=32 RESISTANTThis is a modified FDA-approved test that has been validated and its performance characteristics determined by the reporting laboratory.  This laboratory is certified under the Clinical Laboratory Improvement Amendments CLIA as qualified to perform high complexity clinical laboratory testing.    GENTAMICIN  Value in next row Resistant      >=32 RESISTANTThis is a modified FDA-approved test that has been validated and its performance characteristics determined by the reporting laboratory.  This laboratory is certified under the Clinical Laboratory Improvement Amendments CLIA as  qualified to perform high complexity clinical laboratory testing.    NITROFURANTOIN Value in next row Resistant      >=32 RESISTANTThis is a modified FDA-approved test that has been validated and its performance characteristics determined by the reporting laboratory.  This laboratory is certified under the Clinical Laboratory Improvement Amendments CLIA as qualified to perform high complexity clinical laboratory testing.    TRIMETH /SULFA  Value in next row Resistant      >=32 RESISTANTThis is a modified FDA-approved test that has been validated and its performance characteristics determined by the reporting laboratory.  This laboratory is certified under the  Clinical Laboratory Improvement Amendments CLIA as qualified to perform high complexity clinical laboratory testing.    AMPICILLIN/SULBACTAM Value in next row Resistant      >=32 RESISTANTThis is a modified FDA-approved test that has been validated and its performance characteristics determined by the reporting laboratory.  This laboratory is certified under the Clinical Laboratory Improvement Amendments CLIA as qualified to perform high complexity clinical laboratory testing.    PIP/TAZO Value in next row Intermediate ug/mL     64 INTERMEDIATEThis is a modified FDA-approved test that has been validated and its performance characteristics determined by the reporting laboratory.  This laboratory is certified under the Clinical Laboratory Improvement Amendments CLIA as qualified to perform high complexity clinical laboratory testing.    MEROPENEM  Value in next row Sensitive      64 INTERMEDIATEThis is a modified FDA-approved test that has been validated and its performance characteristics determined by the reporting laboratory.  This laboratory is certified under the Clinical Laboratory Improvement Amendments CLIA as qualified to perform high complexity clinical laboratory testing.    * 80,000 COLONIES/mL KLEBSIELLA PNEUMONIAE  Culture, blood (Routine x  2)     Status: None   Collection Time: 02/07/24  7:58 PM   Specimen: BLOOD  Result Value Ref Range Status   Specimen Description   Final    BLOOD BLOOD RIGHT HAND Performed at [facility], [address] [place], [place] [address]    Special Requests   Final    Blood Culture adequate volume BOTTLES DRAWN AEROBIC AND ANAEROBIC Performed at [facility], [address] [place], [place] [address]    Culture   Final    NO GROWTH 5 DAYS Performed at [facility], [address], [place], [place] [address]    Report Status 02/13/2024 FINAL  Final         Radiology Studies: US  EKG SITE RITE Result Date: 02/12/2024 If Site Rite image not attached, placement could not be confirmed due to current cardiac rhythm.       Scheduled Meds:  allopurinol   300 mg Oral Daily   diltiazem   120 mg Oral Daily   feeding supplement  237 mL Oral BID BM   ferrous sulfate   325 mg Oral Q breakfast   insulin  aspart  0-15 Units Subcutaneous TID WC   insulin  aspart  0-5 Units Subcutaneous QHS   lisinopril   10 mg Oral Daily   metoprolol  succinate  25 mg Oral Daily   polyethylene glycol  17 g Oral Daily   potassium citrate   10 mEq Oral Once per day on Monday Tuesday Wednesday Thursday Friday   senna-docusate  1 tablet Oral Daily   tamsulosin   0.4 mg Oral QPC supper   Continuous Infusions:  meropenem  (MERREM ) IV 1 g (02/13/24 1028)     LOS: 6 days    Time spent: 35 Minutes    Wanya Bangura A Yoshiye Kraft, MD [facility]   If 7PM-7AM, please contact night-coverage [email or web address]  02/13/2024, 1:24 PM

## 2024-02-14 ENCOUNTER — Other Ambulatory Visit: Payer: Self-pay

## 2024-02-14 ENCOUNTER — Encounter (HOSPITAL_COMMUNITY): Payer: Self-pay | Admitting: Urology

## 2024-02-14 DIAGNOSIS — N200 Calculus of kidney: Secondary | ICD-10-CM | POA: Diagnosis not present

## 2024-02-14 DIAGNOSIS — E119 Type 2 diabetes mellitus without complications: Secondary | ICD-10-CM

## 2024-02-14 DIAGNOSIS — I10 Essential (primary) hypertension: Secondary | ICD-10-CM

## 2024-02-14 DIAGNOSIS — N3001 Acute cystitis with hematuria: Secondary | ICD-10-CM | POA: Diagnosis not present

## 2024-02-14 DIAGNOSIS — M1A9XX0 Chronic gout, unspecified, without tophus (tophi): Secondary | ICD-10-CM

## 2024-02-14 DIAGNOSIS — N10 Acute tubulo-interstitial nephritis: Secondary | ICD-10-CM | POA: Diagnosis not present

## 2024-02-14 DIAGNOSIS — D649 Anemia, unspecified: Secondary | ICD-10-CM

## 2024-02-14 DIAGNOSIS — A419 Sepsis, unspecified organism: Secondary | ICD-10-CM | POA: Diagnosis not present

## 2024-02-14 DIAGNOSIS — G4733 Obstructive sleep apnea (adult) (pediatric): Secondary | ICD-10-CM

## 2024-02-14 DIAGNOSIS — I5032 Chronic diastolic (congestive) heart failure: Secondary | ICD-10-CM

## 2024-02-14 DIAGNOSIS — K5900 Constipation, unspecified: Secondary | ICD-10-CM

## 2024-02-14 LAB — CBC
HCT: 35.8 % — ABNORMAL LOW (ref 36.0–46.0)
Hemoglobin: 11.5 g/dL — ABNORMAL LOW (ref 12.0–15.0)
MCH: 28.7 pg (ref 26.0–34.0)
MCHC: 32.1 g/dL (ref 30.0–36.0)
MCV: 89.3 fL (ref 80.0–100.0)
Platelets: 278 K/uL (ref 150–400)
RBC: 4.01 MIL/uL (ref 3.87–5.11)
RDW: 13.1 % (ref 11.5–15.5)
WBC: 11.5 K/uL — ABNORMAL HIGH (ref 4.0–10.5)
nRBC: 0 % (ref 0.0–0.2)

## 2024-02-14 LAB — BASIC METABOLIC PANEL WITH GFR
Anion gap: 14 (ref 5–15)
BUN: 15 mg/dL (ref 8–23)
CO2: 25 mmol/L (ref 22–32)
Calcium: 9.2 mg/dL (ref 8.9–10.3)
Chloride: 99 mmol/L (ref 98–111)
Creatinine, Ser: 0.72 mg/dL (ref 0.44–1.00)
GFR, Estimated: >60 mL/min (ref >60)
Glucose, Bld: 197 mg/dL — ABNORMAL HIGH (ref 70–99)
Potassium: 5 mmol/L (ref 3.5–5.1)
Sodium: 138 mmol/L (ref 135–145)

## 2024-02-14 LAB — GLUCOSE, CAPILLARY
Glucose-Capillary: 204 mg/dL — ABNORMAL HIGH (ref 70–99)
Glucose-Capillary: 180 mg/dL — ABNORMAL HIGH (ref 70–99)
Glucose-Capillary: 171 mg/dL — ABNORMAL HIGH (ref 70–99)
Glucose-Capillary: 188 mg/dL — ABNORMAL HIGH (ref 70–99)

## 2024-02-14 MED ORDER — ERTAPENEM IV (FOR PTA / DISCHARGE USE ONLY): 1.0000 g | INTRAVENOUS | 0 refills | Status: AC

## 2024-02-14 NOTE — Progress Notes (Signed)
 1 Day Post-Op Subjective: Doing well no issues, no N/V/F/C.   Objective: Vital signs in last 24 hours: Temp:  [97 F (36.1 C)-98.7 F (37.1 C)] 97.7 F (36.5 C) (08/27 0545) Pulse Rate:  [73-84] 76 (08/27 0545) Resp:  [13-22] 15 (08/27 0545) BP: (127-161)/(58-77) 142/65 (08/27 0545) SpO2:  [89 %-96 %] 95 % (08/27 0545) FiO2 (%):  [21 %] 21 % (08/26 2200)  Intake/Output from previous day: 08/26 0701 - 08/27 0700 In: 524.7 [I.V.:24.7; IV Piggyback:500] Out: -  Intake/Output this shift: No intake/output data recorded.  Physical Exam:  General: Alert and oriented CV: RRR Lungs: Clear Abdomen: Soft, ND, ATTP; GU: no foley   Lab Results: Recent Labs    02/12/24 0428 02/13/24 0405 02/14/24 0424  HGB 11.4* 11.0* 11.5*  HCT 35.3* 35.0* 35.8*   BMET Recent Labs    02/13/24 0405 02/14/24 0424  NA 138 138  K 4.1 5.0  CL 102 99  CO2 26 25  GLUCOSE 146* 197*  BUN 18 15  CREATININE 0.72 0.72  CALCIUM  8.8* 9.2     Studies/Results: DG C-Arm 1-60 Min-No Report Result Date: 02/13/2024 Fluoroscopy was utilized by the requesting physician.  No radiographic interpretation.   DG C-Arm 1-60 Min-No Report Result Date: 02/13/2024 Fluoroscopy was utilized by the requesting physician.  No radiographic interpretation.   US  EKG SITE RITE Result Date: 02/12/2024 If [facility] image not attached, placement could not be confirmed due to current cardiac rhythm.   Assessment/Plan: 89 F w/ pyelonephritis in setting of left ureteral stent. Admitted to medicine. Right sided non obstructing stone.    # Pyelonephritis  - bilateral stents in place (R w/ strings) (L stent to be removed in clinic in 2 weeks) - continue brad spectrum abx Meropenem  - for total duration of 2 weeks needs likley OPAT.  - Labs and VSS.  Afebrile   # Bilateral stones - s/p bilateral URS  - - bilateral stents in place (R w/ strings to be removed Friday) (L stent to be removed in clinic in 2 weeks) - appt has  been set up.    LOS: 7 days   Jackey Pea MD 02/14/2024, 7:08 AM [facility]

## 2024-02-14 NOTE — Progress Notes (Signed)
 PROGRESS NOTE    Jaime Nichols  FMW:[id number] DOB: [birth year] DOA: 02/07/2024 PCP: Jaime Harvey, MD   Chief Complaint  Patient presents with   Code Sepsis    Brief Narrative: 63 year old with past medical history significant for CAD, chronic diastolic heart failure, diabetes type 2, hyperlipidemia, hypertension, morbid obesity, OSA on CPAP, PVCs, bilateral nephrolithiasis status post left ureteral stent placement on 01/01/2024 recurrent UTI currently she was taking Bactrim  and ciprofloxacin  presents for evaluation of fever chills, nausea dysuria, since she had left ureteral stent placed 5 weeks ago she has had persistent urinary tract infection.  She completed 10 days of Levaquin without significant improvement.  Patient presents with fever, tachycardia white blood cell 19 UA with more than 50 white blood cell.  CT renal stone study showed stable left ureteral stent, stable mild left-sided hydronephrosis, unchanged 3 mm calculus in the proximal left ureter unchanged left perinephric stranding. Patient was admitted for further management of infectious process.     63 year old with past medical history significant for pyelonephritis in the setting of left ureteral stent, right side nonobstructive stone, she was planning for ureteroscopy on 8/21st but this was canceled by urology due to patient's current hospital admission for sepsis and pyelonephritis     Assessment & Plan:   Principal Problem:   Sepsis secondary to UTI [facility]) Active Problems:   Essential hypertension   OSA (obstructive sleep apnea)   Bilateral nephrolithiasis   Acute cystitis with hematuria   Sepsis (HCC)   Acute pyelonephritis   Type 2 diabetes mellitus without complication (HCC)   Chronic gout without tophus   Constipation   Anemia   Chronic heart failure with preserved ejection fraction (HCC)  #1 sepsis secondary to left pyelonephritis/ESBL Klebsiella pneumonia UTI/hydronephrosis with left ureteral  stent in place/bilateral nephrolithiasis - Patient noted to have presented with persistent fever, dysuria, urinalysis consistent with UTI. - Urine cultures done with 80,000 colonies of ESBL Klebsiella pneumonia sensitive to meropenem . - Blood cultures ordered with no growth to date - Patient seen in consultation by urology who followed the patient and patient subsequently underwent cystoscopy, bilateral ureteroscopy with stone removal, ureteroscopic laser lithotripsy, bilateral 84F x 22 ureteral stent placement, bilateral retrograde pyelography with interpretation. - Patient afebrile, improving clinically. - Patient seen in consultation by ID who are recommending PICC line placement postoperatively and planning for at least 2 weeks of IV antibiotics. - Place PICC line order and continue IV Merrem  while in house and will likely discharge on IV ertapenem  per ID recommendations - Urology and ID following and appreciate input and recommendations.  2.  Diabetes mellitus type 2 -Hemoglobin A1c 7.4. - CBG 204 this morning - SSI.  3.  Chronic HFpEF -Stable. - Continue metoprolol , lisinopril , Cardizem .  4.  Hypertension - Continue metoprolol , lisinopril , Cardizem .  5.  Gout -Continue allopurinol .  6.  OSA -CPAP/BiPAP nightly.  7.  Anemia -Patient with no overt bleeding. - Hemoglobin stable at 11.5. -Continue oral iron supplementation. - Follow.  8.  Constipation -Continue current bowel regimen of MiraLAX  daily, Senokot as daily.  9.  Obesity class III -BMI of 43.2 kg/m -Lifestyle modification. - Outpatient follow-up with PCP.   DVT prophylaxis: SCDs Code Status: Full Family Communication: Updated patient.  No family at bedside. Disposition: Likely home when clinically improved and cleared by urology and ID hopefully in the next 24 to 48 hours.   Status is: Inpatient Remains inpatient appropriate because: Severity of of illness   Consultants:  Urology: Dr. Elisabeth  02/08/2024 ID: Dr. Dennise 02/12/2024  Procedures:  CT renal stone protocol 02/07/2024 Cystoscopy/bilateral ureteroscopy and stone removal/uteroscopic laser lead II trips he/bilateral 72F x 22 ureteral stent placement/bilateral retrograde pyelography with interpretation.  Urology: Dr. Shane 02/13/2024  Antimicrobials:  Anti-infectives (From admission, onward)    Start     Dose/Rate Route Frequency Ordered Stop   02/14/24 0000  ertapenem  (INVANZ ) IVPB        1 g Intravenous Every 24 hours 02/14/24 1335 02/27/24 2359   02/13/24 1800  meropenem  (MERREM ) 1 g in sodium chloride  0.9 % 100 mL IVPB        1 g 200 mL/hr over 30 Minutes Intravenous Every 8 hours 02/13/24 1716     02/08/24 0000  meropenem  (MERREM ) 1 g in sodium chloride  0.9 % 100 mL IVPB  Status:  Discontinued        1 g 200 mL/hr over 30 Minutes Intravenous Every 8 hours 02/07/24 2309 02/13/24 1715   02/07/24 2045  ceFEPIme  (MAXIPIME ) 2 g in sodium chloride  0.9 % 100 mL IVPB        2 g 200 mL/hr over 30 Minutes Intravenous  Once 02/07/24 2030 02/07/24 2127         Subjective: Patient laying in bed.  Overall feels much better than she did on admission.  Some complaints of some right-sided flank pain/discomfort.  Some urinary symptoms but improved since admission and postoperatively.  Denies any chest pain.  No shortness of breath.  Objective: Vitals:   02/14/24 0212 02/14/24 0545 02/14/24 0940 02/14/24 1330  BP: (!) 140/64 (!) 142/65 134/67 (!) 116/48  Pulse: 79 76 74 70  Resp: 15 15 18    Temp: 98.6 F (37 C) 97.7 F (36.5 C) 97.7 F (36.5 C) 97.7 F (36.5 C)  TempSrc: Oral Oral Oral Oral  SpO2: 94% 95% 94% (!) 68%  Weight:      Height:        Intake/Output Summary (Last 24 hours) at 02/14/2024 1850 Last data filed at 02/14/2024 0900 Gross per 24 hour  Intake 320 ml  Output --  Net 320 ml   Filed Weights   02/07/24 1822  Weight: 108.9 kg    Examination:  General exam: Appears calm and comfortable   Respiratory system: Clear to auscultation.  No wheezes, no crackles, no rhonchi.  Fair air movement.  Speaking in full sentences.  Respiratory effort normal. Cardiovascular system: S1 & S2 heard, RRR. No JVD, murmurs, rubs, gallops or clicks. No pedal edema. Gastrointestinal system: Abdomen is nondistended, soft and some tenderness to palpation in the suprapubic region and right flank region.  Positive bowel sounds.  No rebound.  No guarding.  Central nervous system: Alert and oriented. No focal neurological deficits. Extremities: Symmetric 5 x 5 power. Skin: No rashes, lesions or ulcers Psychiatry: Judgement and insight appear normal. Mood & affect appropriate.     Data Reviewed: I have personally reviewed following labs and imaging studies  CBC: Recent Labs  Lab 02/10/24 0435 02/11/24 0421 02/12/24 0428 02/13/24 0405 02/14/24 0424  WBC 5.7 6.3 7.6 7.8 11.5*  HGB 11.0* 11.2* 11.4* 11.0* 11.5*  HCT 36.0 34.7* 35.3* 35.0* 35.8*  MCV 91.6 90.1 90.5 90.7 89.3  PLT 239 226 242 246 278    Basic Metabolic Panel: Recent Labs  Lab 02/09/24 0356 02/10/24 0435 02/11/24 0421 02/12/24 0428 02/13/24 0405 02/14/24 0424  NA 135 138 139 137 138 138  K 4.0 4.3 4.4 4.0 4.1 5.0  CL 104 102  101 102 102 99  CO2 25 27 28 26 26 25   GLUCOSE 149* 144* 163* 141* 146* 197*  BUN 17 16 15 14 18 15   CREATININE 0.99 0.76 0.75 0.63 0.72 0.72  CALCIUM  8.7* 8.9 9.0 8.5* 8.8* 9.2  MG 2.0 1.8 1.7  --   --   --   PHOS 2.8 2.8  --   --   --   --     GFR: Estimated Creatinine Clearance: 83.3 mL/min (by C-G formula based on SCr of 0.72 mg/dL).  Liver Function Tests: Recent Labs  Lab 02/09/24 0356 02/10/24 0435  AST 22 24  ALT 20 27  ALKPHOS 87 80  BILITOT 0.4 0.6  PROT 6.5 6.3*  ALBUMIN 3.1* 2.9*    CBG: Recent Labs  Lab 02/13/24 1904 02/13/24 2139 02/14/24 0752 02/14/24 1119 02/14/24 1629  GLUCAP 117* 204* 204* 180* 171*     Recent Results (from the past 240 hours)  Culture,  blood (Routine x 2)     Status: None   Collection Time: 02/07/24  6:45 PM   Specimen: BLOOD LEFT HAND  Result Value Ref Range Status   Specimen Description   Final    BLOOD LEFT HAND Performed at [facility], [address], [place], [place] [address]    Special Requests   Final    BOTTLES DRAWN AEROBIC AND ANAEROBIC Blood Culture adequate volume Performed at [facility], [address] [place], [place] [address]    Culture   Final    NO GROWTH 5 DAYS Performed at [facility], [address] [place], [place] [address]    Report Status 02/12/2024 FINAL  Final  Urine Culture     Status: Abnormal   Collection Time: 02/07/24  7:20 PM   Specimen: Urine, Clean Catch  Result Value Ref Range Status   Specimen Description   Final    URINE, CLEAN CATCH Performed at [facility], [address], [place], [place] [address]    Special Requests   Final    NONE Performed at [facility], [address], [place], [place] [address]    Culture (A)  Final    80,000 COLONIES/mL KLEBSIELLA PNEUMONIAE Confirmed Extended Spectrum Beta-Lactamase Producer (ESBL).  In bloodstream infections from ESBL organisms, carbapenems are preferred over piperacillin/tazobactam. They are shown to have a lower risk of mortality.    Report Status 02/09/2024 FINAL  Final   Organism ID, Bacteria KLEBSIELLA PNEUMONIAE (A)  Final      Susceptibility   Klebsiella pneumoniae - MIC*    AMPICILLIN >=32 RESISTANT Resistant     CEFAZOLIN  (URINE) Value in next row Resistant      >=32 RESISTANTThis is a modified FDA-approved test that has been validated and its performance characteristics determined by the reporting laboratory.  This laboratory is certified under the Clinical Laboratory Improvement Amendments CLIA as qualified to perform high complexity clinical laboratory testing.    CEFEPIME  Value in next row Resistant      >=32 RESISTANTThis is a  modified FDA-approved test that has been validated and its performance characteristics determined by the reporting laboratory.  This laboratory is certified under the Clinical Laboratory Improvement Amendments CLIA as qualified to perform high complexity clinical laboratory testing.    ERTAPENEM  Value in next row Sensitive      >=32 RESISTANTThis is a modified FDA-approved test that has been validated and its performance characteristics determined by the reporting laboratory.  This laboratory is certified under the Clinical Laboratory Improvement Amendments CLIA as qualified to perform high complexity clinical laboratory testing.    CEFTRIAXONE  Value in next row Resistant      >=32 RESISTANTThis is a modified FDA-approved test that has been validated and its performance characteristics determined by the reporting laboratory.  This laboratory is certified under the Clinical Laboratory Improvement Amendments CLIA as qualified to perform high complexity clinical laboratory testing.    CIPROFLOXACIN  Value in next row Resistant      >=32 RESISTANTThis is a modified FDA-approved test that has been validated and its performance characteristics determined by the reporting laboratory.  This laboratory is certified under the Clinical Laboratory Improvement Amendments CLIA as qualified to perform high complexity clinical laboratory testing.    GENTAMICIN  Value in next row Resistant      >=32 RESISTANTThis is a modified FDA-approved test that has been validated and its performance characteristics determined by the reporting laboratory.  This laboratory is certified under the Clinical Laboratory Improvement Amendments CLIA as qualified to perform high complexity clinical laboratory testing.    NITROFURANTOIN Value in next row Resistant      >=32 RESISTANTThis is a modified FDA-approved test that has been validated and its performance characteristics determined by the reporting laboratory.  This laboratory is certified  under the Clinical Laboratory Improvement Amendments CLIA as qualified to perform high complexity clinical laboratory testing.    TRIMETH /SULFA  Value in next row Resistant      >=32 RESISTANTThis is a modified FDA-approved test that has been validated and its performance characteristics determined by the reporting laboratory.  This laboratory is certified under the Clinical Laboratory Improvement Amendments CLIA as qualified to perform high complexity clinical laboratory testing.    AMPICILLIN/SULBACTAM Value in next row Resistant      >=32 RESISTANTThis is a modified FDA-approved test that has been validated and its performance characteristics determined by the reporting laboratory.  This laboratory is certified under the Clinical Laboratory Improvement Amendments CLIA as qualified to perform high complexity clinical laboratory testing.    PIP/TAZO Value in next row Intermediate ug/mL     64 INTERMEDIATEThis is a modified FDA-approved test that has been validated and its performance characteristics determined by the reporting laboratory.  This laboratory is certified under the Clinical Laboratory Improvement Amendments CLIA as qualified to perform high complexity clinical laboratory testing.    MEROPENEM  Value in next row Sensitive      64 INTERMEDIATEThis is a modified FDA-approved test that has been validated and its performance characteristics determined by the reporting laboratory.  This laboratory is certified under the Clinical Laboratory Improvement Amendments CLIA as qualified to perform high complexity clinical laboratory testing.    * 80,000 COLONIES/mL KLEBSIELLA PNEUMONIAE  Culture, blood (Routine x 2)     Status: None   Collection Time: 02/07/24  7:58 PM   Specimen: BLOOD  Result Value Ref Range Status   Specimen Description   Final    BLOOD BLOOD RIGHT HAND Performed at [facility], [address] [place], [place] [address]    Special Requests   Final    Blood  Culture adequate volume BOTTLES DRAWN AEROBIC AND ANAEROBIC Performed at [facility], [address], [place], [place] [address]    Culture   Final    NO GROWTH 5 DAYS Performed at [facility], [address], [place], [place] [address]    Report Status 02/13/2024 FINAL  Final  Radiology Studies: US  EKG SITE RITE Result Date: 02/14/2024 If [facility] image not attached, placement could not be confirmed due to current cardiac rhythm.  DG C-Arm 1-60 Min-No Report Result Date: 02/13/2024 Fluoroscopy was utilized by the requesting physician.  No radiographic interpretation.   DG C-Arm 1-60 Min-No Report Result Date: 02/13/2024 Fluoroscopy was utilized by the requesting physician.  No radiographic interpretation.        Scheduled Meds:  allopurinol   300 mg Oral Daily   diltiazem   120 mg Oral Daily   feeding supplement  237 mL Oral BID BM   ferrous sulfate   325 mg Oral Q breakfast   insulin  aspart  0-15 Units Subcutaneous TID WC   insulin  aspart  0-5 Units Subcutaneous QHS   lisinopril   10 mg Oral Daily   metoprolol  succinate  25 mg Oral Daily   polyethylene glycol  17 g Oral Daily   potassium citrate   10 mEq Oral Once per day on Monday Tuesday Wednesday Thursday Friday   senna-docusate  1 tablet Oral Daily   tamsulosin   0.4 mg Oral QPC supper   Continuous Infusions:  meropenem  (MERREM ) IV 1 g (02/14/24 0915)     LOS: 7 days    Time spent: 40 minutes    Toribio Hummer, MD [facility]   To contact the attending provider between 7A-7P or the covering provider during after hours 7P-7A, please log into the web site [email or web address] and access using universal [place] password for that web site. If you do not have the password, please call the hospital operator.  02/14/2024, 6:50 PM

## 2024-02-14 NOTE — Progress Notes (Signed)
 PHARMACY CONSULT NOTE FOR:  OUTPATIENT  PARENTERAL ANTIBIOTIC THERAPY (OPAT)  Indication: ESBL Kleb pneumo UTI Regimen: Ertapenem  1g IV q24h End date: 02/27/24  IV antibiotic discharge orders are pended. To discharging provider:  please sign these orders via discharge navigator,  Select New Orders & click on the button choice - Manage This Unsigned Work.     Thank you for allowing pharmacy to be a part of this patient's care.  Izetta Carl, PharmD PGY1 Pharmacy Resident [facility]  02/14/2024 10:08 AM

## 2024-02-14 NOTE — Discharge Instructions (Signed)
 DISCHARGE INSTRUCTIONS FOR Ureteroscopy and/or Ureteral Stent Placement  MEDICATIONS:  1.  Robaxin  2. Tamsulosin   3. Hyoscyamine    ACTIVITY:  1. No strenuous activity x 1week  2. No driving while on narcotic pain medications  3. Drink plenty of water   4. Continue to walk at home - it is normal to see blood in the urine while the stent is in place, so keep active, but don't over do it.  5. May return to work/school tomorrow or when you feel ready  6. You may experience some pain when urinating in the kidney on the side that was operated on while the stent is in place this is normal  WHAT IS NORMAL TO EXPERIENCE: It is normal to feel the urge to urinate while the stent is in place It is normal to have blood in your urine while the stent is in place  It sometimes can be normal to have pain in your kidney when you urinate   BATHING:  1. You can shower and we recommend daily showers  2. You have a string coming from your urethra: The stent string is attached to your ureteral stent. Do not pull on this until instructed.   DIET: You may return to your normal diet immediately. Because of the raw surface of your bladder, alcohol, spicy foods, acid type foods and drinks with caffeine may cause irritation or frequency and should be used in moderation. To keep your urine flowing freely and to avoid constipation, drink plenty of fluids during the day ( 8-10 glasses ). Tip: Avoid cranberry juice because it is very acidic.  SIGNS/SYMPTOMS TO CALL:  Please call us  if you have a fever greater than 101.5, uncontrolled nausea/vomiting, uncontrolled pain, dizziness, unable to urinate, bloody urine with clots greater than the size of a quarter, chest pain, shortness of breath, leg swelling, leg pain, redness around wound, drainage from wound, or any other concerns or questions.   You can reach us  at [phone].   FOLLOW-UP:  1. You may remove your Right stent in on Friday 8/29 . To do this go into the  shower, grab hold of the tether coming from your urethra. Pull the tether consistent motion until the stent is removed from your body. The stent will be around 10 inches long with a curl on either end.  2. You you have been set up for f/u in 6-8 weeks  3. Your Left stent will be removed in clinic in 2 weeks

## 2024-02-14 NOTE — Progress Notes (Signed)
   02/14/24 2335  BiPAP/CPAP/SIPAP  BiPAP/CPAP/SIPAP Pt Type Adult  BiPAP/CPAP/SIPAP Resmed  Mask Type Nasal pillows  Dentures removed? Not applicable  Respiratory Rate 18 breaths/min  FiO2 (%) 21 %  Patient Home Machine Yes  Safety Check Completed by RT for Home Unit Yes, no issues noted  Patient Home Mask Yes  Patient Home Tubing Yes  Device Plugged into [employer] Power Outlet Yes

## 2024-02-14 NOTE — Plan of Care (Signed)
  Problem: Fluid Volume: Goal: Ability to maintain a balanced intake and output will improve Outcome: Progressing   Problem: Metabolic: Goal: Ability to maintain appropriate glucose levels will improve Outcome: Progressing   Problem: Education: Goal: Knowledge of General Education information will improve Description: Including pain rating scale, medication(s)/side effects and non-pharmacologic comfort measures Outcome: Progressing   Problem: Clinical Measurements: Goal: Ability to maintain clinical measurements within normal limits will improve Outcome: Progressing   Problem: Clinical Measurements: Goal: Will remain free from infection Outcome: Progressing

## 2024-02-15 DIAGNOSIS — N10 Acute tubulo-interstitial nephritis: Secondary | ICD-10-CM | POA: Diagnosis not present

## 2024-02-15 DIAGNOSIS — N200 Calculus of kidney: Secondary | ICD-10-CM | POA: Diagnosis not present

## 2024-02-15 DIAGNOSIS — N3001 Acute cystitis with hematuria: Secondary | ICD-10-CM | POA: Diagnosis not present

## 2024-02-15 DIAGNOSIS — A419 Sepsis, unspecified organism: Secondary | ICD-10-CM | POA: Diagnosis not present

## 2024-02-15 LAB — CBC WITH DIFFERENTIAL/PLATELET
Abs Immature Granulocytes: 0.18 K/uL — ABNORMAL HIGH (ref 0.00–0.07)
Basophils Absolute: 0.1 K/uL (ref 0.0–0.1)
Basophils Relative: 0 %
Eosinophils Absolute: 0.1 K/uL (ref 0.0–0.5)
Eosinophils Relative: 0 %
HCT: 36.4 % (ref 36.0–46.0)
Hemoglobin: 11.4 g/dL — ABNORMAL LOW (ref 12.0–15.0)
Immature Granulocytes: 1 %
Lymphocytes Relative: 22 %
Lymphs Abs: 3.2 K/uL (ref 0.7–4.0)
MCH: 28.4 pg (ref 26.0–34.0)
MCHC: 31.3 g/dL (ref 30.0–36.0)
MCV: 90.8 fL (ref 80.0–100.0)
Monocytes Absolute: 1.3 K/uL — ABNORMAL HIGH (ref 0.1–1.0)
Monocytes Relative: 9 %
Neutro Abs: 10 K/uL — ABNORMAL HIGH (ref 1.7–7.7)
Neutrophils Relative %: 68 %
Platelets: 325 K/uL (ref 150–400)
RBC: 4.01 MIL/uL (ref 3.87–5.11)
RDW: 13.2 % (ref 11.5–15.5)
WBC: 14.8 K/uL — ABNORMAL HIGH (ref 4.0–10.5)
nRBC: 0 % (ref 0.0–0.2)

## 2024-02-15 LAB — BASIC METABOLIC PANEL WITH GFR
Anion gap: 13 (ref 5–15)
BUN: 20 mg/dL (ref 8–23)
CO2: 25 mmol/L (ref 22–32)
Calcium: 9.5 mg/dL (ref 8.9–10.3)
Chloride: 103 mmol/L (ref 98–111)
Creatinine, Ser: 0.73 mg/dL (ref 0.44–1.00)
GFR, Estimated: >60 mL/min
Glucose, Bld: 165 mg/dL — ABNORMAL HIGH (ref 70–99)
Potassium: 4.2 mmol/L (ref 3.5–5.1)
Sodium: 140 mmol/L (ref 135–145)

## 2024-02-15 LAB — MAGNESIUM: Magnesium: 2.2 mg/dL (ref 1.7–2.4)

## 2024-02-15 LAB — GLUCOSE, CAPILLARY
Glucose-Capillary: 128 mg/dL — ABNORMAL HIGH (ref 70–99)
Glucose-Capillary: 159 mg/dL — ABNORMAL HIGH (ref 70–99)
Glucose-Capillary: 168 mg/dL — ABNORMAL HIGH (ref 70–99)
Glucose-Capillary: 198 mg/dL — ABNORMAL HIGH (ref 70–99)

## 2024-02-15 MED ORDER — LEVALBUTEROL TARTRATE 45 MCG/ACT IN AERO: 1.0000 | INHALATION_SPRAY | RESPIRATORY_TRACT | Status: DC | PRN

## 2024-02-15 MED ORDER — LEVALBUTEROL HCL 0.63 MG/3ML IN NEBU: 0.6300 mg | INHALATION_SOLUTION | RESPIRATORY_TRACT | Status: DC | PRN

## 2024-02-15 MED ORDER — ESTRADIOL 0.1 MG/GM VA CREA
1.0000 | TOPICAL_CREAM | VAGINAL | Status: DC
  Filled 2024-02-15: qty 42.5

## 2024-02-15 MED ORDER — CHLORHEXIDINE GLUCONATE CLOTH 2 % EX PADS
6.0000 | MEDICATED_PAD | Freq: Every day | CUTANEOUS | Status: DC
  Administered 2024-02-15 – 2024-02-16 (×2): 6 via TOPICAL

## 2024-02-15 MED ORDER — SODIUM CHLORIDE 0.9% FLUSH
10.0000 mL | Freq: Two times a day (BID) | INTRAVENOUS | Status: DC
  Administered 2024-02-15: 10 mL

## 2024-02-15 MED ORDER — HYOSCYAMINE SULFATE 0.125 MG PO TBDP
0.1250 mg | ORAL_TABLET | Freq: Four times a day (QID) | ORAL | Status: DC | PRN
  Administered 2024-02-15: 0.125 mg via SUBLINGUAL
  Filled 2024-02-15 (×2): qty 1

## 2024-02-15 MED ORDER — SODIUM CHLORIDE 0.9% FLUSH: 10.0000 mL | INTRAVENOUS | Status: DC | PRN

## 2024-02-15 NOTE — Progress Notes (Signed)
 PROGRESS NOTE    Jaime Nichols  FMW:[id number] DOB: 01/17/1961 DOA: 02/07/2024 PCP: Aisha Harvey, MD   Chief Complaint  Patient presents with   Code Sepsis    Brief Narrative: 63 year old with past medical history significant for CAD, chronic diastolic heart failure, diabetes type 2, hyperlipidemia, hypertension, morbid obesity, OSA on CPAP, PVCs, bilateral nephrolithiasis status post left ureteral stent placement on 01/01/2024 recurrent UTI currently she was taking Bactrim  and ciprofloxacin  presents for evaluation of fever chills, nausea dysuria, since she had left ureteral stent placed 5 weeks ago she has had persistent urinary tract infection.  She completed 10 days of Levaquin without significant improvement.  Patient presents with fever, tachycardia white blood cell 19 UA with more than 50 white blood cell.  CT renal stone study showed stable left ureteral stent, stable mild left-sided hydronephrosis, unchanged 3 mm calculus in the proximal left ureter unchanged left perinephric stranding. Patient was admitted for further management of infectious process.     63 year old with past medical history significant for pyelonephritis in the setting of left ureteral stent, right side nonobstructive stone, she was planning for ureteroscopy on 8/21st but this was canceled by urology due to patient's current hospital admission for sepsis and pyelonephritis     Assessment & Plan:   Principal Problem:   Sepsis secondary to UTI [facility]) Active Problems:   Essential hypertension   OSA (obstructive sleep apnea)   Bilateral nephrolithiasis   Acute cystitis with hematuria   Sepsis (HCC)   Acute pyelonephritis   Type 2 diabetes mellitus without complication (HCC)   Chronic gout without tophus   Constipation   Anemia   Chronic heart failure with preserved ejection fraction (HCC)  #1 sepsis secondary to left pyelonephritis/ESBL Klebsiella pneumonia UTI/hydronephrosis with left ureteral  stent in place/bilateral nephrolithiasis - Patient noted to have presented with persistent fever, dysuria, urinalysis consistent with UTI. - Urine cultures done with 80,000 colonies of ESBL Klebsiella pneumonia sensitive to meropenem . - Blood cultures ordered with no growth to date - Patient seen in consultation by urology who followed the patient and patient subsequently underwent cystoscopy, bilateral ureteroscopy with stone removal, ureteroscopic laser lithotripsy, bilateral 50F x 22 ureteral stent placement, bilateral retrograde pyelography with interpretation. - Patient improving clinically.   - Patient afebrile.  - Patient seen in consultation by ID who are recommending PICC line placement postoperatively and planning for at least 2 weeks of IV antibiotics. - PICC line placed this morning.  - Continue IV Merrem  while in house and discharged on IV ertapenem  per ID recommendations.  -Patient noted with a leukocytosis which seems to be trending up however patient afebrile. -Per urology if worsening leukocytosis in the next 24 hours we will need a CT with contrast to rule out abscess formation. - Urology and ID following and appreciate input and recommendations.  2.  Diabetes mellitus type 2 -Hemoglobin A1c 7.4. - CBG 128 this morning.   - SSI.   3.  Chronic HFpEF - Stable.  - Continue metoprolol , lisinopril , Cardizem .  4.  Hypertension - Controlled on current regimen of lisinopril , metoprolol , Cardizem .  5.  Gout - Allopurinol .  6.  OSA -CPAP/BiPAP nightly.  7.  Anemia -Patient with no overt bleeding. - Hemoglobin stable at 11.4. -Continue oral iron supplementation. - Follow.  8.  Constipation - Continue bowel regimen of MiraLAX  daily, Senokot-S daily.   9.  Obesity class III -BMI of 43.2 kg/m -Lifestyle modification. - Outpatient follow-up with PCP.   DVT prophylaxis: SCDs Code  Status: Full Family Communication: Updated patient.  No family at  bedside. Disposition: Likely home when clinically improved and cleared by urology and ID hopefully in the next 24 to 48 hours.   Status is: Inpatient Remains inpatient appropriate because: Severity of of illness   Consultants:  Urology: Dr. Elisabeth 02/08/2024 ID: Dr. Dennise 02/12/2024  Procedures:  CT renal stone protocol 02/07/2024 Cystoscopy/bilateral ureteroscopy and stone removal/uteroscopic laser lead II trips he/bilateral 50F x 22 ureteral stent placement/bilateral retrograde pyelography with interpretation.  Urology: Dr. Shane 02/13/2024 PICC line placement 02/15/2024  Antimicrobials:  Anti-infectives (From admission, onward)    Start     Dose/Rate Route Frequency Ordered Stop   02/14/24 0000  ertapenem  (INVANZ ) IVPB        1 g Intravenous Every 24 hours 02/14/24 1335 02/27/24 2359   02/13/24 1800  meropenem  (MERREM ) 1 g in sodium chloride  0.9 % 100 mL IVPB        1 g 200 mL/hr over 30 Minutes Intravenous Every 8 hours 02/13/24 1716     02/08/24 0000  meropenem  (MERREM ) 1 g in sodium chloride  0.9 % 100 mL IVPB  Status:  Discontinued        1 g 200 mL/hr over 30 Minutes Intravenous Every 8 hours 02/07/24 2309 02/13/24 1715   02/07/24 2045  ceFEPIme  (MAXIPIME ) 2 g in sodium chloride  0.9 % 100 mL IVPB        2 g 200 mL/hr over 30 Minutes Intravenous  Once 02/07/24 2030 02/07/24 2127         Subjective: Patient lying in bed.  Overall feeling better.  Complaining of some spasms in her right kidney area per patient.  No nausea or vomiting.  No significant abdominal pain.  Urinary symptoms improved.   Objective: Vitals:   02/14/24 1330 02/14/24 1730 02/14/24 2121 02/15/24 0524  BP: (!) 116/48 134/67 134/70 (!) 127/58  Pulse: 70 74 81 78  Resp:  18 18 (!) 22  Temp: 97.7 F (36.5 C) 97.7 F (36.5 C) 99.5 F (37.5 C) 99 F (37.2 C)  TempSrc: Oral Oral Oral Oral  SpO2: 98% 94% 97% 93%  Weight:      Height:        Intake/Output Summary (Last 24 hours) at 02/15/2024  1101 Last data filed at 02/15/2024 1006 Gross per 24 hour  Intake 330 ml  Output --  Net 330 ml   Filed Weights   02/07/24 1822  Weight: 108.9 kg    Examination:  General exam: NAD Respiratory system: Lungs clear to auscultation bilaterally.  No wheezes, no crackles, no rhonchi.  Fair air movement.  Speaking in full sentences.   Cardiovascular system: Regular rate rhythm no murmurs rubs or gallops.  No JVD.  No lower extremity edema.  Gastrointestinal system: Abdomen is soft, nontender, nondistended, positive bowel sounds.  No rebound.  No guarding.  No significant right-sided TTP. Central nervous system: Alert and oriented. No focal neurological deficits. Extremities: Symmetric 5 x 5 power. Skin: No rashes, lesions or ulcers Psychiatry: Judgement and insight appear normal. Mood & affect appropriate.     Data Reviewed: I have personally reviewed following labs and imaging studies  CBC: Recent Labs  Lab 02/11/24 0421 02/12/24 0428 02/13/24 0405 02/14/24 0424 02/15/24 0404  WBC 6.3 7.6 7.8 11.5* 14.8*  NEUTROABS  --   --   --   --  10.0*  HGB 11.2* 11.4* 11.0* 11.5* 11.4*  HCT 34.7* 35.3* 35.0* 35.8* 36.4  MCV 90.1 90.5  90.7 89.3 90.8  PLT 226 242 246 278 325    Basic Metabolic Panel: Recent Labs  Lab 02/09/24 0356 02/10/24 0435 02/11/24 0421 02/12/24 0428 02/13/24 0405 02/14/24 0424 02/15/24 0404  NA 135 138 139 137 138 138 140  K 4.0 4.3 4.4 4.0 4.1 5.0 4.2  CL 104 102 101 102 102 99 103  CO2 25 27 28 26 26 25 25   GLUCOSE 149* 144* 163* 141* 146* 197* 165*  BUN 17 16 15 14 18 15 20   CREATININE 0.99 0.76 0.75 0.63 0.72 0.72 0.73  CALCIUM  8.7* 8.9 9.0 8.5* 8.8* 9.2 9.5  MG 2.0 1.8 1.7  --   --   --  2.2  PHOS 2.8 2.8  --   --   --   --   --     GFR: Estimated Creatinine Clearance: 83.3 mL/min (by C-G formula based on SCr of 0.73 mg/dL).  Liver Function Tests: Recent Labs  Lab 02/09/24 0356 02/10/24 0435  AST 22 24  ALT 20 27  ALKPHOS 87 80   BILITOT 0.4 0.6  PROT 6.5 6.3*  ALBUMIN 3.1* 2.9*    CBG: Recent Labs  Lab 02/14/24 0752 02/14/24 1119 02/14/24 1629 02/14/24 2123 02/15/24 0741  GLUCAP 204* 180* 171* 188* 128*     Recent Results (from the past 240 hours)  Culture, blood (Routine x 2)     Status: None   Collection Time: 02/07/24  6:45 PM   Specimen: BLOOD LEFT HAND  Result Value Ref Range Status   Specimen Description   Final    BLOOD LEFT HAND Performed at [facility], [address] [place], [place] [address]    Special Requests   Final    BOTTLES DRAWN AEROBIC AND ANAEROBIC Blood Culture adequate volume Performed at [facility], [address] [place], [place] [address]    Culture   Final    NO GROWTH 5 DAYS Performed at [facility], [address] [place], [place] [address]    Report Status 02/12/2024 FINAL  Final  Urine Culture     Status: Abnormal   Collection Time: 02/07/24  7:20 PM   Specimen: Urine, Clean Catch  Result Value Ref Range Status   Specimen Description   Final    URINE, CLEAN CATCH Performed at [facility], [address], [place], [place] [address]    Special Requests   Final    NONE Performed at [facility], [address] [place], [place] [address]    Culture (A)  Final    80,000 COLONIES/mL KLEBSIELLA PNEUMONIAE Confirmed Extended Spectrum Beta-Lactamase Producer (ESBL).  In bloodstream infections from ESBL organisms, carbapenems are preferred over piperacillin/tazobactam. They are shown to have a lower risk of mortality.    Report Status 02/09/2024 FINAL  Final   Organism ID, Bacteria KLEBSIELLA PNEUMONIAE (A)  Final      Susceptibility   Klebsiella pneumoniae - MIC*    AMPICILLIN >=32 RESISTANT Resistant     CEFAZOLIN  (URINE) Value in next row Resistant      >=32 RESISTANTThis is a modified FDA-approved test that has been validated and its performance characteristics determined by the  reporting laboratory.  This laboratory is certified under the Clinical Laboratory Improvement Amendments CLIA as qualified to perform high complexity clinical laboratory testing.    CEFEPIME  Value in next row Resistant      >=32 RESISTANTThis is a modified FDA-approved test that has been  validated and its performance characteristics determined by the reporting laboratory.  This laboratory is certified under the Clinical Laboratory Improvement Amendments CLIA as qualified to perform high complexity clinical laboratory testing.    ERTAPENEM  Value in next row Sensitive      >=32 RESISTANTThis is a modified FDA-approved test that has been validated and its performance characteristics determined by the reporting laboratory.  This laboratory is certified under the Clinical Laboratory Improvement Amendments CLIA as qualified to perform high complexity clinical laboratory testing.    CEFTRIAXONE  Value in next row Resistant      >=32 RESISTANTThis is a modified FDA-approved test that has been validated and its performance characteristics determined by the reporting laboratory.  This laboratory is certified under the Clinical Laboratory Improvement Amendments CLIA as qualified to perform high complexity clinical laboratory testing.    CIPROFLOXACIN  Value in next row Resistant      >=32 RESISTANTThis is a modified FDA-approved test that has been validated and its performance characteristics determined by the reporting laboratory.  This laboratory is certified under the Clinical Laboratory Improvement Amendments CLIA as qualified to perform high complexity clinical laboratory testing.    GENTAMICIN  Value in next row Resistant      >=32 RESISTANTThis is a modified FDA-approved test that has been validated and its performance characteristics determined by the reporting laboratory.  This laboratory is certified under the Clinical Laboratory Improvement Amendments CLIA as qualified to perform high complexity clinical  laboratory testing.    NITROFURANTOIN Value in next row Resistant      >=32 RESISTANTThis is a modified FDA-approved test that has been validated and its performance characteristics determined by the reporting laboratory.  This laboratory is certified under the Clinical Laboratory Improvement Amendments CLIA as qualified to perform high complexity clinical laboratory testing.    TRIMETH /SULFA  Value in next row Resistant      >=32 RESISTANTThis is a modified FDA-approved test that has been validated and its performance characteristics determined by the reporting laboratory.  This laboratory is certified under the Clinical Laboratory Improvement Amendments CLIA as qualified to perform high complexity clinical laboratory testing.    AMPICILLIN/SULBACTAM Value in next row Resistant      >=32 RESISTANTThis is a modified FDA-approved test that has been validated and its performance characteristics determined by the reporting laboratory.  This laboratory is certified under the Clinical Laboratory Improvement Amendments CLIA as qualified to perform high complexity clinical laboratory testing.    PIP/TAZO Value in next row Intermediate ug/mL     64 INTERMEDIATEThis is a modified FDA-approved test that has been validated and its performance characteristics determined by the reporting laboratory.  This laboratory is certified under the Clinical Laboratory Improvement Amendments CLIA as qualified to perform high complexity clinical laboratory testing.    MEROPENEM  Value in next row Sensitive      64 INTERMEDIATEThis is a modified FDA-approved test that has been validated and its performance characteristics determined by the reporting laboratory.  This laboratory is certified under the Clinical Laboratory Improvement Amendments CLIA as qualified to perform high complexity clinical laboratory testing.    * 80,000 COLONIES/mL KLEBSIELLA PNEUMONIAE  Culture, blood (Routine x 2)     Status: None   Collection Time:  02/07/24  7:58 PM   Specimen: BLOOD  Result Value Ref Range Status   Specimen Description   Final    BLOOD BLOOD RIGHT HAND Performed at [facility], [address] [place], [place] [address]    Special Requests  Final    Blood Culture adequate volume BOTTLES DRAWN AEROBIC AND ANAEROBIC Performed at [facility], [address] [place], [place] [address]    Culture   Final    NO GROWTH 5 DAYS Performed at [facility], [address], [place], [place] [address]    Report Status 02/13/2024 FINAL  Final         Radiology Studies: US  EKG SITE RITE Result Date: 02/14/2024 If Site Rite image not attached, placement could not be confirmed due to current cardiac rhythm.  DG C-Arm 1-60 Min-No Report Result Date: 02/13/2024 Fluoroscopy was utilized by the requesting physician.  No radiographic interpretation.   DG C-Arm 1-60 Min-No Report Result Date: 02/13/2024 Fluoroscopy was utilized by the requesting physician.  No radiographic interpretation.        Scheduled Meds:  allopurinol   300 mg Oral Daily   Chlorhexidine  Gluconate Cloth  6 each Topical Daily   diltiazem   120 mg Oral Daily   feeding supplement  237 mL Oral BID BM   ferrous sulfate   325 mg Oral Q breakfast   insulin  aspart  0-15 Units Subcutaneous TID WC   insulin  aspart  0-5 Units Subcutaneous QHS   lisinopril   10 mg Oral Daily   metoprolol  succinate  25 mg Oral Daily   polyethylene glycol  17 g Oral Daily   potassium citrate   10 mEq Oral Once per day on Monday Tuesday Wednesday Thursday Friday   senna-docusate  1 tablet Oral Daily   sodium chloride  flush  10-40 mL Intracatheter Q12H   tamsulosin   0.4 mg Oral QPC supper   Continuous Infusions:  meropenem  (MERREM ) IV 1 g (02/15/24 1004)     LOS: 8 days    Time spent: 40 minutes    Toribio Hummer, MD [facility]   To contact the attending provider between 7A-7P or the covering provider during  after hours 7P-7A, please log into the web site [email or web address] and access using universal [place] password for that web site. If you do not have the password, please call the hospital operator.  02/15/2024, 11:01 AM

## 2024-02-15 NOTE — Progress Notes (Signed)
 Peripherally Inserted Central Catheter Placement  The IV Nurse has discussed with the patient and/or persons authorized to consent for the patient, the purpose of this procedure and the potential benefits and risks involved with this procedure.  The benefits include less needle sticks, lab draws from the catheter, and the patient may be discharged home with the catheter. Risks include, but not limited to, infection, bleeding, blood clot (thrombus formation), and puncture of an artery; nerve damage and irregular heartbeat and possibility to perform a PICC exchange if needed/ordered by physician.  Alternatives to this procedure were also discussed.  Bard Power PICC patient education guide, fact sheet on infection prevention and patient information card has been provided to patient /or left at bedside.    PICC Placement Documentation  PICC Single Lumen 02/15/24 Right Cephalic 37 cm 0 cm (Active)  Indication for Insertion or Continuance of Line Prolonged intravenous therapies 02/15/24 0800  Exposed Catheter (cm) 0 cm 02/15/24 0800  Site Assessment Clean, Dry, Intact 02/15/24 0800  Line Status Flushed;Blood return noted;Saline locked 02/15/24 0800  Dressing Type Transparent 02/15/24 0800  Dressing Status Antimicrobial disc/dressing in place 02/15/24 0800  Line Care Connections checked and tightened 02/15/24 0800  Line Adjustment (NICU/IV Team Only) No 02/15/24 0800  Dressing Intervention New dressing 02/15/24 0800  Dressing Change Due 02/22/24 02/15/24 0800       Jaime Nichols 02/15/2024, 8:28 AM

## 2024-02-15 NOTE — Anesthesia Postprocedure Evaluation (Signed)
 Anesthesia Post Note  Patient: Jaime Nichols  Procedure(s) Performed: BILATERAL URETEROSCOPY, HOLIUM LASER, RETROGRADE PYLOGRAM, STENT (Bilateral)     Patient location during evaluation: PACU Anesthesia Type: General Level of consciousness: awake and alert Pain management: pain level controlled Vital Signs Assessment: post-procedure vital signs reviewed and stable Respiratory status: spontaneous breathing, nonlabored ventilation and respiratory function stable Cardiovascular status: blood pressure returned to baseline Postop Assessment: no apparent nausea or vomiting Anesthetic complications: no   No notable events documented.            Vertell Row

## 2024-02-15 NOTE — Progress Notes (Signed)
 [facility]  Date of Admission:  02/07/2024    Principal Problem:   Sepsis secondary to UTI [facility]) Active Problems:   Essential hypertension   OSA (obstructive sleep apnea)   Bilateral nephrolithiasis   Acute cystitis with hematuria   Sepsis (HCC)   Acute pyelonephritis   Type 2 diabetes mellitus without complication (HCC)   Chronic gout without tophus   Constipation   Anemia   Chronic heart failure with preserved ejection fraction [facility])          Assessment: 63 year old female with past medical history of [place]  gastroenteritis, diabetes type 2, hyperlipidemia, OSA on CPAP, PVCs bilateral nephrolithiasis supposed stent placement to the left ureter on 01/01/2024, current UTIs which was most recently treated with Levaquin then on Bactrim  present while flank pain found to have #Pyelonephritis in the setting of bilateral renal calculi #Recent history of left ureteral stent placement - Urine cultures grew Klebsiella pneumonia ESBL.  Patient is on meropenem  - Blood cultures remain negative - Taken to OR on 8/26 with urology for cytoscopy, stone removal and stent placement. Will reset the clock on abx x 2 weeks form OR. Transition to ertapenem  to complete abx.    OPAT ORDERS:  Diagnosis: Complicated uti ESBL Kleb pneumo Allergies  Allergen Reactions   Erythromycin Diarrhea and Nausea And Vomiting    Other reaction(s): stomach upset   Latex Other (See Comments)    REACTION: CONTACT DERMATITIS   Nickel Other (See Comments)    Poor healing- dental reasons   Statins Other (See Comments)    Joint pain   Crestor  [Rosuvastatin ] Other (See Comments)    Cannot take when taking Colchicine   Dapagliflozin     Other reaction(s): worsening of urine incontinence   Hydrochlorothiazide     Previously discontinued due to uric acid kidney stones   Metformin  Hcl Diarrhea and Other (See Comments)    Diarrhea at higher doses, GI Upset    Potassium Citrate  Other (See Comments)    Affected eyes- redness and crusty at 30 mEq/daily; pt states she has no reaction to the potassium citrate  that she is taking now and that this only happened when she was taking too much   Zocor [Simvastatin] Other (See Comments)    Joint pain     Discharge antibiotics to be given via PICC line:  Per pharmacy protocol ertapenem  1gm q24h Aim for Vancomycin trough 15-20 or AUC 400-550 (unless otherwise indicated)   Duration: 2 weks End Date: 9/9  [facility] Care Per Protocol with [employer] Use: Home health RN for IV administration and teaching, line care and labs.    Labs weekly while on IV antibiotics: c__ CBC with differential __ BMP **TWICE WEEKLY ON VANCOMYCIN  _c_ CMP __ CRP __ ESR __ Vancomycin trough TWICE WEEKLY __ CK  _c_ Please pull PIC at completion of IV antibiotics __ Please leave PIC in place until doctor has seen patient or been notified  Fax weekly labs to [phone]  Clinic Follow Up Appt: 9/24 with dr. dennise   Microbiology:   Antibiotics: merrem    SUBJECTIVE: Rsting in bed Interval: wbc 11k, afebrile overnight  Review of Systems: Review of Systems  All other systems reviewed and are negative.    Scheduled Meds:  allopurinol   300 mg Oral Daily   Chlorhexidine  Gluconate Cloth  6 each Topical Daily   diltiazem   120 mg Oral Daily   [START ON 02/16/2024] estradiol   1  Applicatorful Vaginal Once per day on Monday Wednesday Friday   feeding supplement  237 mL Oral BID BM   ferrous sulfate   325 mg Oral Q breakfast   insulin  aspart  0-15 Units Subcutaneous TID WC   insulin  aspart  0-5 Units Subcutaneous QHS   lisinopril   10 mg Oral Daily   metoprolol  succinate  25 mg Oral Daily   polyethylene glycol  17 g Oral Daily   potassium citrate   10 mEq Oral Once per day on Monday Tuesday Wednesday Thursday Friday   senna-docusate  1 tablet Oral Daily   sodium chloride  flush  10-40 mL Intracatheter Q12H    tamsulosin   0.4 mg Oral QPC supper   Continuous Infusions:  meropenem  (MERREM ) IV 1 g (02/15/24 1004)   PRN Meds:.acetaminophen  **OR** acetaminophen , bisacodyl , hyoscyamine , levalbuterol , ondansetron  **OR** ondansetron  (ZOFRAN ) IV, senna-docusate, sodium chloride  flush Allergies  Allergen Reactions   Erythromycin Diarrhea and Nausea And Vomiting    Other reaction(s): stomach upset   Latex Other (See Comments)    REACTION: CONTACT DERMATITIS   Nickel Other (See Comments)    Poor healing- dental reasons   Statins Other (See Comments)    Joint pain   Crestor  [Rosuvastatin ] Other (See Comments)    Cannot take when taking Colchicine   Dapagliflozin     Other reaction(s): worsening of urine incontinence   Hydrochlorothiazide     Previously discontinued due to uric acid kidney stones   Metformin  Hcl Diarrhea and Other (See Comments)    Diarrhea at higher doses, GI Upset   Potassium Citrate  Other (See Comments)    Affected eyes- redness and crusty at 30 mEq/daily; pt states she has no reaction to the potassium citrate  that she is taking now and that this only happened when she was taking too much   Zocor [Simvastatin] Other (See Comments)    Joint pain    OBJECTIVE: Vitals:   02/14/24 1330 02/14/24 1730 02/14/24 2121 02/15/24 0524  BP: (!) 116/48 134/67 134/70 (!) 127/58  Pulse: 70 74 81 78  Resp:  18 18 (!) 22  Temp: 97.7 F (36.5 C) 97.7 F (36.5 C) 99.5 F (37.5 C) 99 F (37.2 C)  TempSrc: Oral Oral Oral Oral  SpO2: 98% 94% 97% 93%  Weight:      Height:       Body mass index is 43.2 kg/m.  Physical Exam Constitutional:      Appearance: Normal appearance.  HENT:     Head: Normocephalic and atraumatic.     Right Ear: Tympanic membrane normal.     Left Ear: Tympanic membrane normal.     Nose: Nose normal.     Mouth/Throat:     Mouth: Mucous membranes are moist.  Eyes:     Extraocular Movements: Extraocular movements intact.     Conjunctiva/sclera: Conjunctivae  normal.     Pupils: Pupils are equal, round, and reactive to light.  Cardiovascular:     Rate and Rhythm: Normal rate and regular rhythm.     Heart sounds: No murmur heard.    No friction rub. No gallop.  Pulmonary:     Effort: Pulmonary effort is normal.     Breath sounds: Normal breath sounds.  Abdominal:     General: Abdomen is flat.     Palpations: Abdomen is soft.  Musculoskeletal:        General: Normal range of motion.  Skin:    General: Skin is warm and dry.  Neurological:  General: No focal deficit present.     Mental Status: She is alert and oriented to person, place, and time.  Psychiatric:        Mood and Affect: Mood normal.       Lab Results Lab Results  Component Value Date   WBC 14.8 (H) 02/15/2024   HGB 11.4 (L) 02/15/2024   HCT 36.4 02/15/2024   MCV 90.8 02/15/2024   PLT 325 02/15/2024    Lab Results  Component Value Date   CREATININE 0.73 02/15/2024   BUN 20 02/15/2024   NA 140 02/15/2024   K 4.2 02/15/2024   CL 103 02/15/2024   CO2 25 02/15/2024    Lab Results  Component Value Date   ALT 27 02/10/2024   AST 24 02/10/2024   ALKPHOS 80 02/10/2024   BILITOT 0.6 02/10/2024        Loney Stank, MD [facility] 02/15/2024, 1:13 PM

## 2024-02-15 NOTE — Progress Notes (Signed)
   02/15/24 2021  BiPAP/CPAP/SIPAP  BiPAP/CPAP/SIPAP Pt Type Adult  BiPAP/CPAP/SIPAP Resmed (from home)  Mask Type Nasal pillows  Dentures removed? Not applicable  EPAP 12.6 cmH2O  FiO2 (%) 21 %  Heater Temperature  (sterile water  supplied)  Patient Home Machine Yes  Safety Check Completed by RT for Home Unit Yes, no issues noted  Patient Home Mask Yes  Patient Home Tubing Yes  Auto Titrate No  Device Plugged into RED Power Outlet Yes  BiPAP/CPAP /SiPAP Vitals  Pulse Rate 76  Resp 19  SpO2 96 %  Bilateral Breath Sounds Clear;Diminished  MEWS Score/Color  MEWS Score 0  MEWS Score Color [place]

## 2024-02-15 NOTE — Progress Notes (Signed)
 2 Days Post-Op Subjective: Doing well, having some R sided ureteral spasms. No F/C/N/V. WBC did increase again to 14.   Objective: Vital signs in last 24 hours: Temp:  [97.7 F (36.5 C)-99.5 F (37.5 C)] 99 F (37.2 C) (08/28 0524) Pulse Rate:  [70-81] 78 (08/28 0524) Resp:  [18-22] 22 (08/28 0524) BP: (116-134)/(48-70) 127/58 (08/28 0524) SpO2:  [93 %-98 %] 93 % (08/28 0524) FiO2 (%):  [21 %] 21 % (08/27 2335)  Intake/Output from previous day: 08/27 0701 - 08/28 0700 In: 440 [P.O.:240; IV Piggyback:200] Out: -  Intake/Output this shift: No intake/output data recorded.  Physical Exam:  General: Alert and oriented CV: RRR Lungs: Clear GU: no foley   Lab Results: Recent Labs    02/13/24 0405 02/14/24 0424 02/15/24 0404  HGB 11.0* 11.5* 11.4*  HCT 35.0* 35.8* 36.4   BMET Recent Labs    02/14/24 0424 02/15/24 0404  NA 138 140  K 5.0 4.2  CL 99 103  CO2 25 25  GLUCOSE 197* 165*  BUN 15 20  CREATININE 0.72 0.73  CALCIUM  9.2 9.5     Studies/Results: US  EKG SITE RITE Result Date: 02/14/2024 If Site Rite image not attached, placement could not be confirmed due to current cardiac rhythm.  DG C-Arm 1-60 Min-No Report Result Date: 02/13/2024 Fluoroscopy was utilized by the requesting physician.  No radiographic interpretation.   DG C-Arm 1-60 Min-No Report Result Date: 02/13/2024 Fluoroscopy was utilized by the requesting physician.  No radiographic interpretation.    Assessment/Plan: 33 F w/ pyelonephritis in setting of left ureteral stent. Admitted to medicine. Right sided non obstructing stone.    # Pyelonephritis  - bilateral stents in place (R w/ strings) (L stent to be removed in clinic in 2 weeks) - continue brad spectrum abx Meropenem  - for total duration of 2 weeks needs likley OPAT.  - WBC continues to increase if WBC increases tomorrow (8/29)recommend getting CT w/ contrast to evalaute for renal abscess.    # Bilateral stones - s/p bilateral  URS  - - bilateral stents in place (R w/ strings to be removed Friday) (L stent to be removed in clinic in 2 weeks) - appt has been set up.    LOS: 8 days   Jackey Pea MD 02/15/2024, 9:10 AM [facility]

## 2024-02-16 ENCOUNTER — Other Ambulatory Visit (HOSPITAL_COMMUNITY): Payer: Self-pay

## 2024-02-16 DIAGNOSIS — A419 Sepsis, unspecified organism: Secondary | ICD-10-CM | POA: Diagnosis not present

## 2024-02-16 DIAGNOSIS — I5032 Chronic diastolic (congestive) heart failure: Secondary | ICD-10-CM | POA: Diagnosis not present

## 2024-02-16 DIAGNOSIS — D649 Anemia, unspecified: Secondary | ICD-10-CM | POA: Diagnosis not present

## 2024-02-16 DIAGNOSIS — G4733 Obstructive sleep apnea (adult) (pediatric): Secondary | ICD-10-CM | POA: Diagnosis not present

## 2024-02-16 LAB — CBC WITH DIFFERENTIAL/PLATELET
Abs Immature Granulocytes: 0.18 K/uL — ABNORMAL HIGH (ref 0.00–0.07)
Basophils Absolute: 0.1 K/uL (ref 0.0–0.1)
Basophils Relative: 1 %
Eosinophils Absolute: 0.2 K/uL (ref 0.0–0.5)
Eosinophils Relative: 2 %
HCT: 35.2 % — ABNORMAL LOW (ref 36.0–46.0)
Hemoglobin: 11.1 g/dL — ABNORMAL LOW (ref 12.0–15.0)
Immature Granulocytes: 2 %
Lymphocytes Relative: 32 %
Lymphs Abs: 3.5 K/uL (ref 0.7–4.0)
MCH: 28.9 pg (ref 26.0–34.0)
MCHC: 31.5 g/dL (ref 30.0–36.0)
MCV: 91.7 fL (ref 80.0–100.0)
Monocytes Absolute: 0.8 K/uL (ref 0.1–1.0)
Monocytes Relative: 7 %
Neutro Abs: 6.3 K/uL (ref 1.7–7.7)
Neutrophils Relative %: 56 %
Platelets: 309 K/uL (ref 150–400)
RBC: 3.84 MIL/uL — ABNORMAL LOW (ref 3.87–5.11)
RDW: 13.4 % (ref 11.5–15.5)
WBC: 11 K/uL — ABNORMAL HIGH (ref 4.0–10.5)
nRBC: 0 % (ref 0.0–0.2)

## 2024-02-16 LAB — BASIC METABOLIC PANEL WITH GFR
Anion gap: 11 (ref 5–15)
BUN: 19 mg/dL (ref 8–23)
CO2: 28 mmol/L (ref 22–32)
Calcium: 9.2 mg/dL (ref 8.9–10.3)
Chloride: 102 mmol/L (ref 98–111)
Creatinine, Ser: 0.75 mg/dL (ref 0.44–1.00)
GFR, Estimated: >60 mL/min (ref >60)
Glucose, Bld: 166 mg/dL — ABNORMAL HIGH (ref 70–99)
Potassium: 4.2 mmol/L (ref 3.5–5.1)
Sodium: 141 mmol/L (ref 135–145)

## 2024-02-16 LAB — GLUCOSE, CAPILLARY
Glucose-Capillary: 141 mg/dL — ABNORMAL HIGH (ref 70–99)
Glucose-Capillary: 146 mg/dL — ABNORMAL HIGH (ref 70–99)

## 2024-02-16 MED ORDER — ONDANSETRON HCL 4 MG PO TABS
4.0000 mg | ORAL_TABLET | Freq: Four times a day (QID) | ORAL | 0 refills | Status: DC | PRN
  Filled 2024-02-16: qty 20, 5d supply, fill #0

## 2024-02-16 MED ORDER — POLYETHYLENE GLYCOL 3350 17 G PO PACK: 17.0000 g | PACK | Freq: Every day | ORAL | Status: DC

## 2024-02-16 MED ORDER — FERROUS SULFATE 325 (65 FE) MG PO TABS: 325.0000 mg | ORAL_TABLET | Freq: Every day | ORAL | Status: DC

## 2024-02-16 MED ORDER — SODIUM CHLORIDE 0.9 % IV SOLN
1.0000 g | Freq: Once | INTRAVENOUS | Status: AC
  Administered 2024-02-16: 1 g via INTRAVENOUS
  Filled 2024-02-16 (×2): qty 1000

## 2024-02-16 MED ORDER — SENNOSIDES-DOCUSATE SODIUM 8.6-50 MG PO TABS: 1.0000 | ORAL_TABLET | Freq: Every day | ORAL | Status: DC

## 2024-02-16 MED ORDER — ENSURE PLUS HIGH PROTEIN PO LIQD: 237.0000 mL | Freq: Two times a day (BID) | ORAL | Status: DC

## 2024-02-16 MED ORDER — ACETAMINOPHEN 325 MG PO TABS: 650.0000 mg | ORAL_TABLET | ORAL | Status: AC | PRN

## 2024-02-16 NOTE — Progress Notes (Signed)
Discharge medication delivered to patient at bedside.

## 2024-02-16 NOTE — TOC Transition Note (Addendum)
 Transition of Care [facility]) - Discharge Note   Patient Details  Name: Jaime Nichols MRN: [id number] Date of Birth: [birth year]  [facility]) CM/SW Contact:  Tawni CHRISTELLA Eva, LCSW Phone Number: 02/16/2024, 2:17 PM   Clinical Narrative:     Pt to discharge home with IV antibiotics supplied by [facility]. The pt's home health nursing was arranged with [facility] as well.  Pt reports no transportation needs. IP Care Management sign off.   Final next level of care: Home w Home Health Services    Patient Goals and CMS Choice Patient states their goals for this hospitalization and ongoing recovery are:: retrun home with home heath          Discharge Placement                    Patient and family notified of of transfer: 02/16/24  Discharge Plan and Services Additional resources added to the After Visit Summary for                                       Social Drivers of Health (SDOH) Interventions SDOH Screenings   Food Insecurity: No Food Insecurity (02/08/2024)  Housing: Low Risk  (02/08/2024)  Transportation Needs: No Transportation Needs (02/08/2024)  Utilities: Not At Risk (02/08/2024)  Social Connections: Unknown (10/31/2021)   Received from [facility]  Tobacco Use: Low Risk  (02/07/2024)     Readmission Risk Interventions     No data to display

## 2024-02-16 NOTE — Discharge Summary (Signed)
 Physician Discharge Summary  Jaime Nichols FMW:[id number] DOB: 22-Apr-1961 DOA: 02/07/2024  PCP: Aisha Harvey, MD  Admit date: 02/07/2024 Discharge date: 02/16/2024  Time spent: 60 minutes  Recommendations for Outpatient Follow-up:  Follow-up with Aisha Harvey, MD in 2 weeks.  On follow-up patient diabetes will need to be reassessed as well as her hypertension.  Patient will need a basic metabolic profile done to follow-up on electrolytes and renal function. Follow-up with Dr. Shane, urology in 1 to 2 weeks as previously scheduled. Follow-up with Dr. Dennise, ID on 03/13/2024 at 3:45 PM.   Discharge Diagnoses:  Principal Problem:   Sepsis secondary to UTI [facility]) Active Problems:   Essential hypertension   OSA (obstructive sleep apnea)   Bilateral nephrolithiasis   Acute cystitis with hematuria   Sepsis (HCC)   Acute pyelonephritis   Type 2 diabetes mellitus without complication (HCC)   Chronic gout without tophus   Constipation   Anemia   Chronic heart failure with preserved ejection fraction [facility])   Discharge Condition: Stable and improved.  Diet recommendation: Carb modified diet  Filed Weights   02/07/24 1822  Weight: 108.9 kg    History of present illness:  HPI per Dr. Lou Romero Jaime Nichols is a 63 y.o. female with medical history significant for CAD, chronic diastolic HF, T2DM, HLD, HTN, morbid obesity, OSA on CPAP, PVCs, bilateral nephrolithiasis s/p recent left ureteral stent placement on 01/01/2024, recurrent UTI currently on Bactrim  and Cipro  now presented to the ED for evaluation of fever, chills, nausea and dysuria.  Patient reports that since having the left ureteral stent was placed 5 weeks ago, she has had persistent urinary tract infection. She completed 10 days of Levaquin without significant improvement.  She had a repeat urine studies and started on Bactrim  over a week ago.  She has continued to have bladder pain and dysuria. Yesterday, she  started feeling bad then experienced chills and left flank pain at night. This morning, she had a fever of up to 102 with associated nausea but no vomiting.  She continues to reports bladder pain but denies any abdominal pain, chest pain, dizziness, headache, hematuria or back pain.   ED Course: Initial vitals show temp 103.1, RR 18, HR 127, BP 147/78, SpO2 95% on room air. Initial labs significant for sodium 133, K+ 4.2, bicarb 19, glucose 215, creatinine 1.03, WBC 19.5, Hgb 13.5, platelet 284, PT/INR 14.5/1.1, lactic acid 1.5, UA shows moderate hemoglobinuria, moderate proteinuria, positive nitrite, large leuks, RBC >50, WBC >50 and many bacteria.  CT renal stone study shows stable left ureteral stent, stable mild left-sided hydronephrosis, unchanged 3 mm calculi in the proximal left ureter, unchanged left perinephric stranding and fluid and nonobstructing bilateral renal calculi.  Pt received Tylenol  650 mg x 1, IV NS 2 L bolus and IV cefepime .  Urology was consulted for evaluation. [facility] was consulted for admission.   Hospital Course:  #1 sepsis secondary to left pyelonephritis/ESBL Klebsiella pneumonia UTI/hydronephrosis with left ureteral stent in place/bilateral nephrolithiasis - Patient noted to have presented with persistent fever, dysuria, urinalysis consistent with UTI. - Urine cultures done with 80,000 colonies of ESBL Klebsiella pneumonia sensitive to meropenem . - Blood cultures ordered with no growth to date - Patient seen in consultation by urology who followed the patient and patient subsequently underwent cystoscopy, bilateral ureteroscopy with stone removal, ureteroscopic laser lithotripsy, bilateral 63F x 22 ureteral stent placement, bilateral retrograde pyelography with interpretation. - Patient remained afebrile and improved clinically during the hospitalization.  -  Patient seen in consultation by ID who recommended PICC line placement postoperatively and planning for at least 2 weeks of  IV antibiotics. - Patient was maintained on IV Merrem  while in house and discharged on IV ertapenem  per ID recommendations.  -Patient noted with a leukocytosis which improved by day of discharge.   - Patient seen and followed by urology throughout the hospitalization and will follow-up with urology and ID in the outpatient setting.    2.  Diabetes mellitus type 2 -Hemoglobin A1c 7.4. - Patient maintained on sliding scale insulin  throughout the hospitalization.   - Outpatient follow-up.   3.  Chronic HFpEF - Stable.  - Patient maintained on home regimen metoprolol , lisinopril , Cardizem .   4.  Hypertension - Remain controlled on lisinopril , metoprolol , Cardizem .   5.  Gout - Patient maintained on home regimen allopurinol .   6.  OSA -CPAP/BiPAP nightly.   7.  Anemia -Patient with no overt bleeding. - Hemoglobin stable at 11.1 by day of discharge. - Patient was maintained on oral iron supplementation.   - Outpatient follow-up.    8.  Constipation - Patient maintained on a bowel regimen of MiraLAX  daily, Senokot-S daily.    9.  Obesity class III -BMI of 43.2 kg/m -Lifestyle modification. - Outpatient follow-up with PCP.    Procedures: CT renal stone protocol 02/07/2024 Cystoscopy/bilateral ureteroscopy and stone removal/uteroscopic laser lead II trips he/bilateral 81F x 22 ureteral stent placement/bilateral retrograde pyelography with interpretation.  Urology: Dr. Shane 02/13/2024 PICC line placement 02/15/2024  Consultations: Urology: Dr. Elisabeth 02/08/2024 ID: Dr. Dennise 02/12/2024  Discharge Exam: Vitals:   02/16/24 0544 02/16/24 1315  BP: 127/65 119/61  Pulse: 80 78  Resp: 17   Temp: 98 F (36.7 C) 98.4 F (36.9 C)  SpO2: 95% 98%    General: NAD Cardiovascular: Regular rate rhythm no murmurs rubs or gallops.  No JVD.  No lower extremity edema. Respiratory: Clear to auscultation bilaterally.  No wheezes, no crackles, no rhonchi.  Fair movement.  Speaking in full  sentences.  Discharge Instructions   Discharge Instructions     [facility] pharmacist to adjust dose for Vancomycin, Aminoglycosides and other anti-infective therapies as requested by physician.   Complete by: As directed    [facility] to provide Cath Flo 2mg    Complete by: As directed    Administer for PICC line occlusion and as ordered by physician for other access device issues.   Anaphylaxis Kit: Provided to treat any anaphylactic reaction to the medication being provided to the patient if First Dose or when requested by physician   Complete by: As directed    Epinephrine  1mg /ml vial / amp: Administer 0.3mg  (0.74ml) subcutaneously once for moderate to severe anaphylaxis, nurse to call physician and pharmacy when reaction occurs and call 911 if needed for immediate care   Diphenhydramine  50mg /ml IV vial: Administer 25-50mg  IV/IM PRN for first dose reaction, rash, itching, mild reaction, nurse to call physician and pharmacy when reaction occurs   Sodium Chloride  0.9% NS 500ml IV: Administer if needed for hypovolemic blood pressure drop or as ordered by physician after call to physician with anaphylactic reaction   Change dressing on IV access line weekly and PRN   Complete by: As directed    Diet Carb Modified   Complete by: As directed    Flush IV access with Sodium Chloride  0.9% and Heparin 10 units/ml or 100 units/ml   Complete by: As directed    Home infusion instructions - [facility]   Complete by: As directed    Instructions: Flush IV access with Sodium Chloride  0.9% and Heparin 10units/ml or 100units/ml   Change dressing on IV access line: Weekly and PRN   Instructions Cath Flo 2mg : Administer for PICC Line occlusion and as ordered by physician for other access device   [facility] pharmacist to adjust dose for: Vancomycin, Aminoglycosides and other anti-infective therapies as requested by physician   Increase activity slowly    Complete by: As directed    Method of administration may be changed at the discretion of home infusion pharmacist based upon assessment of the patient and/or caregiver's ability to self-administer the medication ordered   Complete by: As directed    No wound care   Complete by: As directed       Allergies as of 02/16/2024       Reactions   Erythromycin Diarrhea, Nausea And Vomiting   Other reaction(s): stomach upset   Latex Other (See Comments)   REACTION: CONTACT DERMATITIS   Nickel Other (See Comments)   Poor healing- dental reasons   Statins Other (See Comments)   Joint pain   Crestor  [rosuvastatin ] Other (See Comments)   Cannot take when taking Colchicine   Dapagliflozin    Other reaction(s): worsening of urine incontinence   Hydrochlorothiazide    Previously discontinued due to uric acid kidney stones   Metformin  Hcl Diarrhea, Other (See Comments)   Diarrhea at higher doses, GI Upset   Potassium Citrate  Other (See Comments)   Affected eyes- redness and crusty at 30 mEq/daily; pt states she has no reaction to the potassium citrate  that she is taking now and that this only happened when she was taking too much   Zocor [simvastatin] Other (See Comments)   Joint pain        Medication List     TAKE these medications    acetaminophen  325 MG tablet Commonly known as: TYLENOL  Take 2 tablets (650 mg total) by mouth every 4 (four) hours as needed for mild pain (pain score 1-3), fever or moderate pain (pain score 4-6) (or Fever >/= 101).   allopurinol  300 MG tablet Commonly known as: ZYLOPRIM  Take 300 mg by mouth daily.   colchicine 0.6 MG tablet Take 0.6 mg by mouth daily as needed (gout flares).   D 1000 25 MCG (1000 UT) capsule Generic drug: Cholecalciferol Take 1,000 Units by mouth 3 (three) times a week.   diltiazem  120 MG 24 hr capsule Commonly known as: CARDIZEM  CD TAKE 1 CAPSULE BY MOUTH EVERY DAY   ertapenem  IVPB Commonly known as: INVANZ  Inject 1  g into the vein daily for 13 days. Indication:  ESBL Kleb pneumo UTI First Dose: Yes Last Day of Therapy:  02/27/24 Labs - Once weekly:  CBC/D and BMP, Labs - Once weekly: ESR and CRP Method of administration: Mini-Bag Plus / Gravity Method of administration may be changed at the discretion of home infusion pharmacist based upon assessment of the patient and/or caregiver's ability to self-administer the medication ordered.   estradiol  0.1 MG/GM vaginal cream Commonly known as: ESTRACE  Place 1 Applicatorful vaginally 3 (three) times a week.   feeding supplement Liqd Take 237 mLs by mouth 2 (two) times daily between meals. Start taking on: February 17, 2024   ferrous sulfate  325 (65 FE) MG tablet Take 1 tablet (325 mg total) by mouth daily with breakfast. Start taking on: February 17, 2024   hyoscyamine  0.125 MG Tbdp disintergrating tablet Commonly  known as: ANASPAZ  Place 1 tablet (0.125 mg total) under the tongue every 6 (six) hours as needed for up to 20 doses.   levalbuterol  45 MCG/ACT inhaler Commonly known as: XOPENEX  HFA Inhale 1-2 puffs into the lungs every 4 (four) hours as needed for shortness of breath.   lisinopril  10 MG tablet Commonly known as: ZESTRIL  Take 1 tablet (10 mg total) by mouth daily.   metFORMIN  500 MG 24 hr tablet Commonly known as: GLUCOPHAGE -XR Take 500 mg by mouth daily with breakfast.   metoprolol  succinate 25 MG 24 hr tablet Commonly known as: TOPROL -XL TAKE 1 TABLET (25 MG TOTAL) BY MOUTH DAILY.   ondansetron  4 MG tablet Commonly known as: ZOFRAN  Take 1 tablet (4 mg total) by mouth every 6 (six) hours as needed for nausea.   polyethylene glycol 17 g packet Commonly known as: MIRALAX  / GLYCOLAX  Take 17 g by mouth daily. Start taking on: February 17, 2024   Potassium Citrate  15 MEQ (1620 MG) Tbcr Take 15 mEq by mouth 3 (three) times a week. Monday, Wednesday. Friday   PRESCRIPTION MEDICATION CPAP- At bedtime   Repatha  SureClick 140 MG/ML  Soaj Generic drug: Evolocumab  INJECT 1 PEN INTO THE SKIN EVERY 14 (FOURTEEN) DAYS.   senna-docusate 8.6-50 MG tablet Commonly known as: Senokot-S Take 1 tablet by mouth daily. Start taking on: February 17, 2024   tamsulosin  0.4 MG Caps capsule Commonly known as: FLOMAX  Take 1 capsule (0.4 mg total) by mouth daily after supper. What changed:  when to take this reasons to take this   tirzepatide 15 MG/0.5ML Pen Commonly known as: MOUNJARO Inject 15 mg into the skin every Monday.   tiZANidine  4 MG tablet Commonly known as: ZANAFLEX  Take 0.5 tablets (2 mg total) by mouth every 6 (six) hours as needed for muscle spasms.               Discharge Care Instructions  (From admission, onward)           Start     Ordered   02/14/24 0000  Change dressing on IV access line weekly and PRN  (Home infusion instructions - [facility] )        02/14/24 1335           Allergies  Allergen Reactions   Erythromycin Diarrhea and Nausea And Vomiting    Other reaction(s): stomach upset   Latex Other (See Comments)    REACTION: CONTACT DERMATITIS   Nickel Other (See Comments)    Poor healing- dental reasons   Statins Other (See Comments)    Joint pain   Crestor  [Rosuvastatin ] Other (See Comments)    Cannot take when taking Colchicine   Dapagliflozin     Other reaction(s): worsening of urine incontinence   Hydrochlorothiazide     Previously discontinued due to uric acid kidney stones   Metformin  Hcl Diarrhea and Other (See Comments)    Diarrhea at higher doses, GI Upset   Potassium Citrate  Other (See Comments)    Affected eyes- redness and crusty at 30 mEq/daily; pt states she has no reaction to the potassium citrate  that she is taking now and that this only happened when she was taking too much   Zocor [Simvastatin] Other (See Comments)    Joint pain    Follow-up Information     Aisha Harvey, MD. Schedule an appointment as soon as possible for a visit in 2  week(s).   Specialty: Family Medicine Contact information: [address]  [place] [address] [phone]         Shane Steffan BROCKS, MD. Schedule an appointment as soon as possible for a visit in 1 week(s).   Specialty: Urology Why: Follow-up in 1 to 2 weeks or as scheduled. Contact information: [address] [place] [address] [phone]         Dennise Kingsley, MD Follow up on 03/13/2024.   Specialty: Infectious Diseases Why: Follow-up 3:45 PM. Contact information: [address], [address] [place] [address] [phone]                  The results of significant diagnostics from this hospitalization (including imaging, microbiology, ancillary and laboratory) are listed below for reference.    Significant Diagnostic Studies: US  EKG SITE RITE Result Date: 02/14/2024 If Site Rite image not attached, placement could not be confirmed due to current cardiac rhythm.  DG C-Arm 1-60 Min-No Report Result Date: 02/13/2024 Fluoroscopy was utilized by the requesting physician.  No radiographic interpretation.   DG C-Arm 1-60 Min-No Report Result Date: 02/13/2024 Fluoroscopy was utilized by the requesting physician.  No radiographic interpretation.   US  EKG SITE RITE Result Date: 02/12/2024 If [facility] image not attached, placement could not be confirmed due to current cardiac rhythm.  CT Renal Stone Study Result Date: 02/07/2024 CLINICAL DATA:  Abdominal and flank pain. EXAM: CT ABDOMEN AND PELVIS WITHOUT CONTRAST TECHNIQUE: Multidetector CT imaging of the abdomen and pelvis was performed following the standard protocol without IV contrast. RADIATION DOSE REDUCTION: This exam was performed according to the departmental dose-optimization program which includes automated exposure control, adjustment of the mA and/or kV according to patient size and/or use of iterative reconstruction technique. COMPARISON:  CT abdomen and pelvis 01/01/2024.  FINDINGS: Lower chest: No acute abnormality. Hepatobiliary: No focal liver abnormality is seen. Status post cholecystectomy. No biliary dilatation. Pancreas: Unremarkable. No pancreatic ductal dilatation or surrounding inflammatory changes. Spleen: Normal in size without focal abnormality. Adrenals/Urinary Tract: Left ureteral stent is in place, new from prior. There is mild left-sided hydronephrosis. 3 mm calculus in the proximal left ureter is unchanged in position. There is mild left perinephric stranding, similar to prior. Additional punctate nonobstructing left renal calculi are also stable. Right renal calculi are unchanged from the prior examination including a calculus in the right renal pelvis measuring 9 mm. There is no hydronephrosis. There is a cyst in the right kidney measuring 17 mm, unchanged from prior. The adrenal glands are within normal limits. Stomach/Bowel: Stomach is within normal limits. Appendix appears normal. No evidence of bowel wall thickening, distention, or inflammatory changes. Vascular/Lymphatic: Aortic atherosclerosis. No enlarged abdominal or pelvic lymph nodes. Reproductive: Status post hysterectomy. No adnexal masses. Other: There is no ascites. There is a small fat containing umbilical hernia. Musculoskeletal: No acute or significant osseous findings. IMPRESSION: 1. New left ureteral stent in place. 2. Stable mild left-sided hydronephrosis. Unchanged 3 mm calculus in the proximal left ureter. 3. Unchanged left perinephric stranding and fluid. Infection not excluded. 4. Additional nonobstructing bilateral renal calculi are unchanged. Aortic Atherosclerosis (ICD10-I70.0). Electronically Signed   By: Greig Pique M.D.   On: 02/07/2024 19:24    Microbiology: Recent Results (from the past 240 hours)  Culture, blood (Routine x 2)     Status: None   Collection Time: 02/07/24  6:45 PM   Specimen: BLOOD LEFT HAND  Result Value Ref Range Status   Specimen Description   Final     BLOOD LEFT  HAND Performed at [facility], [address] [place], [place] [address]    Special Requests   Final    BOTTLES DRAWN AEROBIC AND ANAEROBIC Blood Culture adequate volume Performed at [facility], [address] [place], [place] [address]    Culture   Final    NO GROWTH 5 DAYS Performed at [facility], [address], [place], [place] [address]    Report Status 02/12/2024 FINAL  Final  Urine Culture     Status: Abnormal   Collection Time: 02/07/24  7:20 PM   Specimen: Urine, Clean Catch  Result Value Ref Range Status   Specimen Description   Final    URINE, CLEAN CATCH Performed at [facility], [address] [place], [place] [address]    Special Requests   Final    NONE Performed at [facility], [address] [place], [place] [address]    Culture (A)  Final    80,000 COLONIES/mL KLEBSIELLA PNEUMONIAE Confirmed Extended Spectrum Beta-Lactamase Producer (ESBL).  In bloodstream infections from ESBL organisms, carbapenems are preferred over piperacillin/tazobactam. They are shown to have a lower risk of mortality.    Report Status 02/09/2024 FINAL  Final   Organism ID, Bacteria KLEBSIELLA PNEUMONIAE (A)  Final      Susceptibility   Klebsiella pneumoniae - MIC*    AMPICILLIN >=32 RESISTANT Resistant     CEFAZOLIN  (URINE) Value in next row Resistant      >=32 RESISTANTThis is a modified FDA-approved test that has been validated and its performance characteristics determined by the reporting laboratory.  This laboratory is certified under the Clinical Laboratory Improvement Amendments CLIA as qualified to perform high complexity clinical laboratory testing.    CEFEPIME  Value in next row Resistant      >=32 RESISTANTThis is a modified FDA-approved test that has been validated and its performance characteristics determined by the reporting laboratory.  This laboratory is certified under the Clinical  Laboratory Improvement Amendments CLIA as qualified to perform high complexity clinical laboratory testing.    ERTAPENEM  Value in next row Sensitive      >=32 RESISTANTThis is a modified FDA-approved test that has been validated and its performance characteristics determined by the reporting laboratory.  This laboratory is certified under the Clinical Laboratory Improvement Amendments CLIA as qualified to perform high complexity clinical laboratory testing.    CEFTRIAXONE  Value in next row Resistant      >=32 RESISTANTThis is a modified FDA-approved test that has been validated and its performance characteristics determined by the reporting laboratory.  This laboratory is certified under the Clinical Laboratory Improvement Amendments CLIA as qualified to perform high complexity clinical laboratory testing.    CIPROFLOXACIN  Value in next row Resistant      >=32 RESISTANTThis is a modified FDA-approved test that has been validated and its performance characteristics determined by the reporting laboratory.  This laboratory is certified under the Clinical Laboratory Improvement Amendments CLIA as qualified to perform high complexity clinical laboratory testing.    GENTAMICIN  Value in next row Resistant      >=32 RESISTANTThis is a modified FDA-approved test that has been validated and its performance characteristics determined by the reporting laboratory.  This laboratory is certified under the Clinical Laboratory Improvement Amendments CLIA as qualified to perform high complexity clinical laboratory testing.    NITROFURANTOIN Value in next row Resistant      >=32 RESISTANTThis is a modified FDA-approved test that has been  validated and its performance characteristics determined by the reporting laboratory.  This laboratory is certified under the Clinical Laboratory Improvement Amendments CLIA as qualified to perform high complexity clinical laboratory testing.    TRIMETH /SULFA  Value in next row Resistant       >=32 RESISTANTThis is a modified FDA-approved test that has been validated and its performance characteristics determined by the reporting laboratory.  This laboratory is certified under the Clinical Laboratory Improvement Amendments CLIA as qualified to perform high complexity clinical laboratory testing.    AMPICILLIN/SULBACTAM Value in next row Resistant      >=32 RESISTANTThis is a modified FDA-approved test that has been validated and its performance characteristics determined by the reporting laboratory.  This laboratory is certified under the Clinical Laboratory Improvement Amendments CLIA as qualified to perform high complexity clinical laboratory testing.    PIP/TAZO Value in next row Intermediate ug/mL     64 INTERMEDIATEThis is a modified FDA-approved test that has been validated and its performance characteristics determined by the reporting laboratory.  This laboratory is certified under the Clinical Laboratory Improvement Amendments CLIA as qualified to perform high complexity clinical laboratory testing.    MEROPENEM  Value in next row Sensitive      64 INTERMEDIATEThis is a modified FDA-approved test that has been validated and its performance characteristics determined by the reporting laboratory.  This laboratory is certified under the Clinical Laboratory Improvement Amendments CLIA as qualified to perform high complexity clinical laboratory testing.    * 80,000 COLONIES/mL KLEBSIELLA PNEUMONIAE  Culture, blood (Routine x 2)     Status: None   Collection Time: 02/07/24  7:58 PM   Specimen: BLOOD  Result Value Ref Range Status   Specimen Description   Final    BLOOD BLOOD RIGHT HAND Performed at [facility], [address] [place], [place] [address]    Special Requests   Final    Blood Culture adequate volume BOTTLES DRAWN AEROBIC AND ANAEROBIC Performed at [facility], [address] [place], [place] [address]    Culture   Final    NO  GROWTH 5 DAYS Performed at [facility], [address], [place], [place] [address]    Report Status 02/13/2024 FINAL  Final     Labs: Basic Metabolic Panel: Recent Labs  Lab 02/10/24 0435 02/11/24 0421 02/12/24 0428 02/13/24 0405 02/14/24 0424 02/15/24 0404 02/16/24 0334  NA 138 139 137 138 138 140 141  K 4.3 4.4 4.0 4.1 5.0 4.2 4.2  CL 102 101 102 102 99 103 102  CO2 27 28 26 26 25 25 28   GLUCOSE 144* 163* 141* 146* 197* 165* 166*  BUN 16 15 14 18 15 20 19   CREATININE 0.76 0.75 0.63 0.72 0.72 0.73 0.75  CALCIUM  8.9 9.0 8.5* 8.8* 9.2 9.5 9.2  MG 1.8 1.7  --   --   --  2.2  --   PHOS 2.8  --   --   --   --   --   --    Liver Function Tests: Recent Labs  Lab 02/10/24 0435  AST 24  ALT 27  ALKPHOS 78  BILITOT 0.6  PROT 6.3*  ALBUMIN 2.9*   No results for input(s): LIPASE, AMYLASE in the last 168 hours. No results for input(s): AMMONIA in the last 168 hours. CBC: Recent Labs  Lab 02/12/24 0428 02/13/24 0405 02/14/24 0424 02/15/24 0404 02/16/24 0334  WBC 7.6 7.8 11.5* 14.8* 11.0*  NEUTROABS  --   --   --  10.0* 6.3  HGB 11.4* 11.0* 11.5* 11.4* 11.1*  HCT 35.3* 35.0* 35.8* 36.4 35.2*  MCV 90.5 90.7 89.3 90.8 91.7  PLT 242 246 278 325 309   Cardiac Enzymes: No results for input(s): CKTOTAL, CKMB, CKMBINDEX, TROPONINI in the last 168 hours. BNP: BNP (last 3 results) Recent Labs    12/13/23 1017  BNP 12.2    ProBNP (last 3 results) No results for input(s): PROBNP in the last 8760 hours.  CBG: Recent Labs  Lab 02/15/24 1223 02/15/24 1638 02/15/24 2101 02/16/24 0831 02/16/24 1312  GLUCAP 159* 168* 198* 141* 146*       Signed:  Toribio Hummer MD.  [facility] 02/16/2024, 2:13 PM

## 2024-02-16 NOTE — Progress Notes (Signed)
 3 Days Post-Op Subjective: Patient doing well no N/V/F/C. WBC has decreased to 11 from 14 yesterday  Objective: Vital signs in last 24 hours: Temp:  [97.9 F (36.6 C)-98.9 F (37.2 C)] 98 F (36.7 C) (08/29 0544) Pulse Rate:  [75-80] 80 (08/29 0544) Resp:  [17-29] 17 (08/29 0544) BP: (118-127)/(55-65) 127/65 (08/29 0544) SpO2:  [93 %-96 %] 95 % (08/29 0544) FiO2 (%):  [21 %] 21 % (08/28 2021)  Intake/Output from previous day: 08/28 0701 - 08/29 0700 In: 370 [P.O.:360; I.V.:10] Out: -  Intake/Output this shift: No intake/output data recorded.  Physical Exam:  General: Alert and oriented CV: RRR Lungs: Clear GU: no foley   Lab Results: Recent Labs    02/14/24 0424 02/15/24 0404 02/16/24 0334  HGB 11.5* 11.4* 11.1*  HCT 35.8* 36.4 35.2*   BMET Recent Labs    02/15/24 0404 02/16/24 0334  NA 140 141  K 4.2 4.2  CL 103 102  CO2 25 28  GLUCOSE 165* 166*  BUN 20 19  CREATININE 0.73 0.75  CALCIUM  9.5 9.2     Studies/Results: US  EKG SITE RITE Result Date: 02/14/2024 If Site Rite image not attached, placement could not be confirmed due to current cardiac rhythm.   Assessment/Plan: 67 F w/ pyelonephritis in setting of left ureteral stent. Admitted to medicine. Right sided non obstructing stone.    # Pyelonephritis  - bilateral stents in place (R w/ strings) (L stent to be removed in clinic in 2 weeks) - continue brad spectrum abx Meropenem  - for total duration of 2 weeks needs likley OPAT.  - WBC has decreased today patient can be discharged on OPAT.  - Urology to sign off    # Bilateral stones - s/p bilateral URS  - - bilateral stents in place (R w/ strings to be removed Friday) (L stent to be removed in clinic in 2 weeks) - appt has been set up.    LOS: 9 days   Jackey Pea MD 02/16/2024, 7:10 AM [facility]

## 2024-02-16 NOTE — Progress Notes (Incomplete)
 PROGRESS NOTE    Jaime Nichols  FMW:[id number] DOB: 1961/03/27 DOA: 02/07/2024 PCP: Aisha Harvey, MD   Chief Complaint  Patient presents with   Code Sepsis    Brief Narrative: 63 year old with past medical history significant for CAD, chronic diastolic heart failure, diabetes type 2, hyperlipidemia, hypertension, morbid obesity, OSA on CPAP, PVCs, bilateral nephrolithiasis status post left ureteral stent placement on 01/01/2024 recurrent UTI currently she was taking Bactrim  and ciprofloxacin  presents for evaluation of fever chills, nausea dysuria, since she had left ureteral stent placed 5 weeks ago she has had persistent urinary tract infection.  She completed 10 days of Levaquin without significant improvement.  Patient presents with fever, tachycardia white blood cell 19 UA with more than 50 white blood cell.  CT renal stone study showed stable left ureteral stent, stable mild left-sided hydronephrosis, unchanged 3 mm calculus in the proximal left ureter unchanged left perinephric stranding. Patient was admitted for further management of infectious process.     63 year old with past medical history significant for pyelonephritis in the setting of left ureteral stent, right side nonobstructive stone, she was planning for ureteroscopy on 8/21st but this was canceled by urology due to patient's current hospital admission for sepsis and pyelonephritis     Assessment & Plan:   Principal Problem:   Sepsis secondary to UTI [facility]) Active Problems:   Essential hypertension   OSA (obstructive sleep apnea)   Bilateral nephrolithiasis   Acute cystitis with hematuria   Sepsis (HCC)   Acute pyelonephritis   Type 2 diabetes mellitus without complication (HCC)   Chronic gout without tophus   Constipation   Anemia   Chronic heart failure with preserved ejection fraction (HCC)  #1 sepsis secondary to left pyelonephritis/ESBL Klebsiella pneumonia UTI/hydronephrosis with left ureteral  stent in place/bilateral nephrolithiasis - Patient noted to have presented with persistent fever, dysuria, urinalysis consistent with UTI. - Urine cultures done with 80,000 colonies of ESBL Klebsiella pneumonia sensitive to meropenem . - Blood cultures ordered with no growth to date - Patient seen in consultation by urology who followed the patient and patient subsequently underwent cystoscopy, bilateral ureteroscopy with stone removal, ureteroscopic laser lithotripsy, bilateral 24F x 22 ureteral stent placement, bilateral retrograde pyelography with interpretation. - Patient improving clinically.   - Patient afebrile.  - Patient seen in consultation by ID who are recommending PICC line placement postoperatively and planning for at least 2 weeks of IV antibiotics. - PICC line placed this morning.  - Continue IV Merrem  while in house and discharged on IV ertapenem  per ID recommendations.  -Patient noted with a leukocytosis which seems to be trending up however patient afebrile. -Per urology if worsening leukocytosis in the next 24 hours we will need a CT with contrast to rule out abscess formation. - Urology and ID following and appreciate input and recommendations.  2.  Diabetes mellitus type 2 -Hemoglobin A1c 7.4. - CBG 128 this morning.   - SSI.   3.  Chronic HFpEF - Stable.  - Continue metoprolol , lisinopril , Cardizem .  4.  Hypertension - Controlled on current regimen of lisinopril , metoprolol , Cardizem .  5.  Gout - Allopurinol .  6.  OSA -CPAP/BiPAP nightly.  7.  Anemia -Patient with no overt bleeding. - Hemoglobin stable at 11.4. -Continue oral iron supplementation. - Follow.  8.  Constipation - Continue bowel regimen of MiraLAX  daily, Senokot-S daily.   9.  Obesity class III -BMI of 43.2 kg/m -Lifestyle modification. - Outpatient follow-up with PCP.   DVT prophylaxis: SCDs Code  Status: Full Family Communication: Updated patient.  No family at  bedside. Disposition: Likely home when clinically improved and cleared by urology and ID hopefully in the next 24 to 48 hours.   Status is: Inpatient Remains inpatient appropriate because: Severity of of illness   Consultants:  Urology: Dr. Elisabeth 02/08/2024 ID: Dr. Dennise 02/12/2024  Procedures:  CT renal stone protocol 02/07/2024 Cystoscopy/bilateral ureteroscopy and stone removal/uteroscopic laser lead II trips he/bilateral 44F x 22 ureteral stent placement/bilateral retrograde pyelography with interpretation.  Urology: Dr. Shane 02/13/2024 PICC line placement 02/15/2024  Antimicrobials:  Anti-infectives (From admission, onward)    Start     Dose/Rate Route Frequency Ordered Stop   02/14/24 0000  ertapenem  (INVANZ ) IVPB        1 g Intravenous Every 24 hours 02/14/24 1335 02/27/24 2359   02/13/24 1800  meropenem  (MERREM ) 1 g in sodium chloride  0.9 % 100 mL IVPB        1 g 200 mL/hr over 30 Minutes Intravenous Every 8 hours 02/13/24 1716     02/08/24 0000  meropenem  (MERREM ) 1 g in sodium chloride  0.9 % 100 mL IVPB  Status:  Discontinued        1 g 200 mL/hr over 30 Minutes Intravenous Every 8 hours 02/07/24 2309 02/13/24 1715   02/07/24 2045  ceFEPIme  (MAXIPIME ) 2 g in sodium chloride  0.9 % 100 mL IVPB        2 g 200 mL/hr over 30 Minutes Intravenous  Once 02/07/24 2030 02/07/24 2127         Subjective: Patient lying in bed.  Overall feeling better.  Complaining of some spasms in her right kidney area per patient.  No nausea or vomiting.  No significant abdominal pain.  Urinary symptoms improved.   Objective: Vitals:   02/15/24 1400 02/15/24 2014 02/15/24 2021 02/16/24 0544  BP:  (!) 119/57  127/65  Pulse:  75 76 80  Resp: 17 18 19 17   Temp:  98.9 F (37.2 C)  98 F (36.7 C)  TempSrc:  Oral  Oral  SpO2:  95% 96% 95%  Weight:      Height:       No intake or output data in the 24 hours ending 02/16/24 1258  Filed Weights   02/07/24 1822  Weight: 108.9 kg     Examination:  General exam: NAD Respiratory system: Lungs clear to auscultation bilaterally.  No wheezes, no crackles, no rhonchi.  Fair air movement.  Speaking in full sentences.   Cardiovascular system: Regular rate rhythm no murmurs rubs or gallops.  No JVD.  No lower extremity edema.  Gastrointestinal system: Abdomen is soft, nontender, nondistended, positive bowel sounds.  No rebound.  No guarding.  No significant right-sided TTP. Central nervous system: Alert and oriented. No focal neurological deficits. Extremities: Symmetric 5 x 5 power. Skin: No rashes, lesions or ulcers Psychiatry: Judgement and insight appear normal. Mood & affect appropriate.     Data Reviewed: I have personally reviewed following labs and imaging studies  CBC: Recent Labs  Lab 02/12/24 0428 02/13/24 0405 02/14/24 0424 02/15/24 0404 02/16/24 0334  WBC 7.6 7.8 11.5* 14.8* 11.0*  NEUTROABS  --   --   --  10.0* 6.3  HGB 11.4* 11.0* 11.5* 11.4* 11.1*  HCT 35.3* 35.0* 35.8* 36.4 35.2*  MCV 90.5 90.7 89.3 90.8 91.7  PLT 242 246 278 325 309    Basic Metabolic Panel: Recent Labs  Lab 02/10/24 0435 02/11/24 0421 02/12/24 0428 02/13/24 0405 02/14/24  0424 02/15/24 0404 02/16/24 0334  NA 138 139 137 138 138 140 141  K 4.3 4.4 4.0 4.1 5.0 4.2 4.2  CL 102 101 102 102 99 103 102  CO2 27 28 26 26 25 25 28   GLUCOSE 144* 163* 141* 146* 197* 165* 166*  BUN 16 15 14 18 15 20 19   CREATININE 0.76 0.75 0.63 0.72 0.72 0.73 0.75  CALCIUM  8.9 9.0 8.5* 8.8* 9.2 9.5 9.2  MG 1.8 1.7  --   --   --  2.2  --   PHOS 2.8  --   --   --   --   --   --     GFR: Estimated Creatinine Clearance: 83.3 mL/min (by C-G formula based on SCr of 0.75 mg/dL).  Liver Function Tests: Recent Labs  Lab 02/10/24 0435  AST 24  ALT 27  ALKPHOS 80  BILITOT 0.6  PROT 6.3*  ALBUMIN 2.9*    CBG: Recent Labs  Lab 02/15/24 0741 02/15/24 1223 02/15/24 1638 02/15/24 2101 02/16/24 0831  GLUCAP 128* 159* 168* 198* 141*      Recent Results (from the past 240 hours)  Culture, blood (Routine x 2)     Status: None   Collection Time: 02/07/24  6:45 PM   Specimen: BLOOD LEFT HAND  Result Value Ref Range Status   Specimen Description   Final    BLOOD LEFT HAND Performed at [facility], [address], [place], [place] [address]    Special Requests   Final    BOTTLES DRAWN AEROBIC AND ANAEROBIC Blood Culture adequate volume Performed at [facility], [address] [place], [place] [address]    Culture   Final    NO GROWTH 5 DAYS Performed at [facility], [address] [place], [place] [address]    Report Status 02/12/2024 FINAL  Final  Urine Culture     Status: Abnormal   Collection Time: 02/07/24  7:20 PM   Specimen: Urine, Clean Catch  Result Value Ref Range Status   Specimen Description   Final    URINE, CLEAN CATCH Performed at [facility], [address], [place], [place] [address]    Special Requests   Final    NONE Performed at [facility], [address] [place], [place] [address]    Culture (A)  Final    80,000 COLONIES/mL KLEBSIELLA PNEUMONIAE Confirmed Extended Spectrum Beta-Lactamase Producer (ESBL).  In bloodstream infections from ESBL organisms, carbapenems are preferred over piperacillin/tazobactam. They are shown to have a lower risk of mortality.    Report Status 02/09/2024 FINAL  Final   Organism ID, Bacteria KLEBSIELLA PNEUMONIAE (A)  Final      Susceptibility   Klebsiella pneumoniae - MIC*    AMPICILLIN >=32 RESISTANT Resistant     CEFAZOLIN  (URINE) Value in next row Resistant      >=32 RESISTANTThis is a modified FDA-approved test that has been validated and its performance characteristics determined by the reporting laboratory.  This laboratory is certified under the Clinical Laboratory Improvement Amendments CLIA as qualified to perform high complexity clinical laboratory testing.    CEFEPIME   Value in next row Resistant      >=32 RESISTANTThis is a modified FDA-approved test that has been validated and its performance characteristics determined by the reporting laboratory.  This laboratory is certified under the Clinical Laboratory Improvement Amendments CLIA as qualified to perform high complexity clinical laboratory testing.  ERTAPENEM  Value in next row Sensitive      >=32 RESISTANTThis is a modified FDA-approved test that has been validated and its performance characteristics determined by the reporting laboratory.  This laboratory is certified under the Clinical Laboratory Improvement Amendments CLIA as qualified to perform high complexity clinical laboratory testing.    CEFTRIAXONE  Value in next row Resistant      >=32 RESISTANTThis is a modified FDA-approved test that has been validated and its performance characteristics determined by the reporting laboratory.  This laboratory is certified under the Clinical Laboratory Improvement Amendments CLIA as qualified to perform high complexity clinical laboratory testing.    CIPROFLOXACIN  Value in next row Resistant      >=32 RESISTANTThis is a modified FDA-approved test that has been validated and its performance characteristics determined by the reporting laboratory.  This laboratory is certified under the Clinical Laboratory Improvement Amendments CLIA as qualified to perform high complexity clinical laboratory testing.    GENTAMICIN  Value in next row Resistant      >=32 RESISTANTThis is a modified FDA-approved test that has been validated and its performance characteristics determined by the reporting laboratory.  This laboratory is certified under the Clinical Laboratory Improvement Amendments CLIA as qualified to perform high complexity clinical laboratory testing.    NITROFURANTOIN Value in next row Resistant      >=32 RESISTANTThis is a modified FDA-approved test that has been validated and its performance characteristics determined  by the reporting laboratory.  This laboratory is certified under the Clinical Laboratory Improvement Amendments CLIA as qualified to perform high complexity clinical laboratory testing.    TRIMETH /SULFA  Value in next row Resistant      >=32 RESISTANTThis is a modified FDA-approved test that has been validated and its performance characteristics determined by the reporting laboratory.  This laboratory is certified under the Clinical Laboratory Improvement Amendments CLIA as qualified to perform high complexity clinical laboratory testing.    AMPICILLIN/SULBACTAM Value in next row Resistant      >=32 RESISTANTThis is a modified FDA-approved test that has been validated and its performance characteristics determined by the reporting laboratory.  This laboratory is certified under the Clinical Laboratory Improvement Amendments CLIA as qualified to perform high complexity clinical laboratory testing.    PIP/TAZO Value in next row Intermediate ug/mL     64 INTERMEDIATEThis is a modified FDA-approved test that has been validated and its performance characteristics determined by the reporting laboratory.  This laboratory is certified under the Clinical Laboratory Improvement Amendments CLIA as qualified to perform high complexity clinical laboratory testing.    MEROPENEM  Value in next row Sensitive      64 INTERMEDIATEThis is a modified FDA-approved test that has been validated and its performance characteristics determined by the reporting laboratory.  This laboratory is certified under the Clinical Laboratory Improvement Amendments CLIA as qualified to perform high complexity clinical laboratory testing.    * 80,000 COLONIES/mL KLEBSIELLA PNEUMONIAE  Culture, blood (Routine x 2)     Status: None   Collection Time: 02/07/24  7:58 PM   Specimen: BLOOD  Result Value Ref Range Status   Specimen Description   Final    BLOOD BLOOD RIGHT HAND Performed at [facility], [address],  [place], [place] [address]    Special Requests   Final    Blood Culture adequate volume BOTTLES DRAWN AEROBIC AND ANAEROBIC Performed at [facility], [address] [place], [place] [address]    Culture   Final  NO GROWTH 5 DAYS Performed at [facility], [address], [place], [place] [address]    Report Status 02/13/2024 FINAL  Final         Radiology Studies: No results found.       Scheduled Meds:  allopurinol   300 mg Oral Daily   Chlorhexidine  Gluconate Cloth  6 each Topical Daily   diltiazem   120 mg Oral Daily   estradiol   1 Applicatorful Vaginal Once per day on Monday Wednesday Friday   feeding supplement  237 mL Oral BID BM   ferrous sulfate   325 mg Oral Q breakfast   insulin  aspart  0-15 Units Subcutaneous TID WC   insulin  aspart  0-5 Units Subcutaneous QHS   lisinopril   10 mg Oral Daily   metoprolol  succinate  25 mg Oral Daily   polyethylene glycol  17 g Oral Daily   potassium citrate   10 mEq Oral Once per day on Monday Tuesday Wednesday Thursday Friday   senna-docusate  1 tablet Oral Daily   sodium chloride  flush  10-40 mL Intracatheter Q12H   tamsulosin   0.4 mg Oral QPC supper   Continuous Infusions:  meropenem  (MERREM ) IV 1 g (02/16/24 0926)     LOS: 9 days    Time spent: 40 minutes    Toribio Hummer, MD [facility]   To contact the attending provider between 7A-7P or the covering provider during after hours 7P-7A, please log into the web site [email or web address] and access using universal [place] password for that web site. If you do not have the password, please call the hospital operator.  02/16/2024, 12:58 PM

## 2024-02-17 DIAGNOSIS — A419 Sepsis, unspecified organism: Secondary | ICD-10-CM | POA: Diagnosis not present

## 2024-02-17 DIAGNOSIS — N3001 Acute cystitis with hematuria: Secondary | ICD-10-CM | POA: Diagnosis not present

## 2024-02-18 DIAGNOSIS — N3001 Acute cystitis with hematuria: Secondary | ICD-10-CM | POA: Diagnosis not present

## 2024-02-18 DIAGNOSIS — A419 Sepsis, unspecified organism: Secondary | ICD-10-CM | POA: Diagnosis not present

## 2024-02-19 DIAGNOSIS — A419 Sepsis, unspecified organism: Secondary | ICD-10-CM | POA: Diagnosis not present

## 2024-02-19 DIAGNOSIS — N3001 Acute cystitis with hematuria: Secondary | ICD-10-CM | POA: Diagnosis not present

## 2024-02-20 ENCOUNTER — Other Ambulatory Visit: Payer: Self-pay | Admitting: *Deleted

## 2024-02-20 DIAGNOSIS — E7800 Pure hypercholesterolemia, unspecified: Secondary | ICD-10-CM

## 2024-02-20 DIAGNOSIS — Z79899 Other long term (current) drug therapy: Secondary | ICD-10-CM

## 2024-02-20 DIAGNOSIS — A419 Sepsis, unspecified organism: Secondary | ICD-10-CM | POA: Diagnosis not present

## 2024-02-20 DIAGNOSIS — N3001 Acute cystitis with hematuria: Secondary | ICD-10-CM | POA: Diagnosis not present

## 2024-02-20 DIAGNOSIS — E785 Hyperlipidemia, unspecified: Secondary | ICD-10-CM

## 2024-02-20 DIAGNOSIS — I2510 Atherosclerotic heart disease of native coronary artery without angina pectoris: Secondary | ICD-10-CM

## 2024-02-21 DIAGNOSIS — A419 Sepsis, unspecified organism: Secondary | ICD-10-CM | POA: Diagnosis not present

## 2024-02-21 DIAGNOSIS — N3001 Acute cystitis with hematuria: Secondary | ICD-10-CM | POA: Diagnosis not present

## 2024-02-22 ENCOUNTER — Ambulatory Visit: Admitting: Cardiology

## 2024-02-22 ENCOUNTER — Telehealth: Payer: Self-pay

## 2024-02-22 DIAGNOSIS — A419 Sepsis, unspecified organism: Secondary | ICD-10-CM | POA: Diagnosis not present

## 2024-02-22 DIAGNOSIS — N3001 Acute cystitis with hematuria: Secondary | ICD-10-CM | POA: Diagnosis not present

## 2024-02-22 NOTE — Telephone Encounter (Signed)
 Patient called, reports she is not bouncing back and she is overall sleepy and fatigued. She is struggling to keep hydrated. Denies fever, chills, or nausea.   States she saw her labs from yesterday and is concerned her infection is not under control.   Charne Mcbrien, BSN, RN

## 2024-02-23 DIAGNOSIS — A419 Sepsis, unspecified organism: Secondary | ICD-10-CM | POA: Diagnosis not present

## 2024-02-23 DIAGNOSIS — N3001 Acute cystitis with hematuria: Secondary | ICD-10-CM | POA: Diagnosis not present

## 2024-02-23 NOTE — Telephone Encounter (Signed)
 Patient called to follow up. Patient stated that she is concerned about her lab work. Patient thinks that her IV abx need to be continued. Abx ends on 9/10, PICC line will be removed on 9/10, Kidney stent removal on 9/11.  Message sent to Dr.Singh in secure chat to contact patient directly to discuss concerns.   Jaime Nichols, CMA

## 2024-02-24 DIAGNOSIS — A419 Sepsis, unspecified organism: Secondary | ICD-10-CM | POA: Diagnosis not present

## 2024-02-24 DIAGNOSIS — N3001 Acute cystitis with hematuria: Secondary | ICD-10-CM | POA: Diagnosis not present

## 2024-02-25 DIAGNOSIS — A419 Sepsis, unspecified organism: Secondary | ICD-10-CM | POA: Diagnosis not present

## 2024-02-25 DIAGNOSIS — N3001 Acute cystitis with hematuria: Secondary | ICD-10-CM | POA: Diagnosis not present

## 2024-02-26 DIAGNOSIS — A419 Sepsis, unspecified organism: Secondary | ICD-10-CM | POA: Diagnosis not present

## 2024-02-26 DIAGNOSIS — N3001 Acute cystitis with hematuria: Secondary | ICD-10-CM | POA: Diagnosis not present

## 2024-02-27 DIAGNOSIS — A419 Sepsis, unspecified organism: Secondary | ICD-10-CM | POA: Diagnosis not present

## 2024-02-27 DIAGNOSIS — N3001 Acute cystitis with hematuria: Secondary | ICD-10-CM | POA: Diagnosis not present

## 2024-02-27 LAB — STONE ANALYSIS
Calcium Oxalate Dihydrate: 20 %
Calcium Oxalate Monohydrate: 80 %
Weight Calculi: 49 mg

## 2024-02-28 ENCOUNTER — Ambulatory Visit: Admitting: Physician Assistant

## 2024-02-28 DIAGNOSIS — N3001 Acute cystitis with hematuria: Secondary | ICD-10-CM | POA: Diagnosis not present

## 2024-02-28 DIAGNOSIS — A419 Sepsis, unspecified organism: Secondary | ICD-10-CM | POA: Diagnosis not present

## 2024-02-29 DIAGNOSIS — N10 Acute tubulo-interstitial nephritis: Secondary | ICD-10-CM | POA: Diagnosis not present

## 2024-02-29 DIAGNOSIS — N201 Calculus of ureter: Secondary | ICD-10-CM | POA: Diagnosis not present

## 2024-02-29 DIAGNOSIS — R3121 Asymptomatic microscopic hematuria: Secondary | ICD-10-CM | POA: Diagnosis not present

## 2024-02-29 DIAGNOSIS — B3731 Acute candidiasis of vulva and vagina: Secondary | ICD-10-CM | POA: Diagnosis not present

## 2024-03-04 ENCOUNTER — Other Ambulatory Visit: Payer: Self-pay | Admitting: Cardiology

## 2024-03-04 DIAGNOSIS — E538 Deficiency of other specified B group vitamins: Secondary | ICD-10-CM | POA: Diagnosis not present

## 2024-03-04 DIAGNOSIS — M8588 Other specified disorders of bone density and structure, other site: Secondary | ICD-10-CM | POA: Diagnosis not present

## 2024-03-04 DIAGNOSIS — I10 Essential (primary) hypertension: Secondary | ICD-10-CM | POA: Diagnosis not present

## 2024-03-04 DIAGNOSIS — E785 Hyperlipidemia, unspecified: Secondary | ICD-10-CM | POA: Diagnosis not present

## 2024-03-04 DIAGNOSIS — A419 Sepsis, unspecified organism: Secondary | ICD-10-CM | POA: Diagnosis not present

## 2024-03-04 DIAGNOSIS — E1165 Type 2 diabetes mellitus with hyperglycemia: Secondary | ICD-10-CM | POA: Diagnosis not present

## 2024-03-04 DIAGNOSIS — N390 Urinary tract infection, site not specified: Secondary | ICD-10-CM | POA: Diagnosis not present

## 2024-03-04 DIAGNOSIS — N200 Calculus of kidney: Secondary | ICD-10-CM | POA: Diagnosis not present

## 2024-03-04 DIAGNOSIS — I5032 Chronic diastolic (congestive) heart failure: Secondary | ICD-10-CM | POA: Diagnosis not present

## 2024-03-07 DIAGNOSIS — I10 Essential (primary) hypertension: Secondary | ICD-10-CM | POA: Diagnosis not present

## 2024-03-13 ENCOUNTER — Ambulatory Visit (INDEPENDENT_AMBULATORY_CARE_PROVIDER_SITE_OTHER): Payer: Self-pay | Admitting: Internal Medicine

## 2024-03-13 ENCOUNTER — Other Ambulatory Visit: Payer: Self-pay

## 2024-03-13 VITALS — BP 120/79 | HR 86 | Temp 98.5°F | Resp 16 | Wt 240.0 lb

## 2024-03-13 DIAGNOSIS — N390 Urinary tract infection, site not specified: Secondary | ICD-10-CM

## 2024-03-13 NOTE — Progress Notes (Unsigned)
 Patient: Jaime Nichols  DOB: [birth year] MRN: [id number] PCP: Aisha Harvey, MD    Chief Complaint  Patient presents with   Hospitalization Follow-up     Patient Active Problem List   Diagnosis Date Noted   Acute pyelonephritis 02/14/2024   Type 2 diabetes mellitus without complication 02/14/2024   Chronic gout without tophus 02/14/2024   Constipation 02/14/2024   Anemia 02/14/2024   Chronic heart failure with preserved ejection fraction (HCC) 02/14/2024   Acute cystitis with hematuria 02/07/2024   Sepsis (HCC) 02/07/2024   Diarrhea 06/28/2023   Yeast UTI 08/04/2021   Complicated UTI (urinary tract infection) 08/01/2021   Hepatic steatosis 08/01/2021   Uncontrolled type 2 diabetes mellitus with hyperglycemia, without long-term current use of insulin  (HCC) 08/01/2021   H/O lithotripsy 08/01/2021   Sepsis secondary to UTI (HCC) 07/02/2021   Bilateral nephrolithiasis 07/02/2021   Coronary artery calcification seen on CAT scan    PAT (paroxysmal atrial tachycardia) 05/24/2018   OSA (obstructive sleep apnea)    Chronic diastolic CHF (congestive heart failure) (HCC)    Essential hypertension 04/22/2014   Morbid obesity (HCC) 12/10/2013   Hyperlipidemia    PECTUS EXCAVATUM 01/31/2008   DYSPNEA 01/31/2008     Subjective:  Jaime Nichols is a 63 y.o. F past medical history of Salmonella [place]  gastroenteritis,diabetes type 2, hyperlipidemia, OSA on CPAP, PVCs bilateral nephrolithiasis sp stent placement to the left ureter on 01/01/2024, current UTIs which was most recently treated with Levaquin then on Bactrim  presented with pyelonephritis in the setting of bilateral renal calculi.  Urine cultures grew Klebsiella pneumonia ESBL on 8/20 patient was started on meropenem  then discharged on ertapenem  to complete 2 weeks from cystoscopy and stone removal with stent placement on 8/26.  EOT antibiotics 9/9. Today:.  Patient is doing well.  She completed abx.   denies fevers and chills Review of Systems  All other systems reviewed and are negative.   Past Medical History:  Diagnosis Date   Aortic atherosclerosis    Arthritis    CAD in native artery    very high calcium  score at 875.  coronary CTA  showed 30% RCA and LAD.   Chronic diastolic CHF (congestive heart failure) (HCC) 2012   Diabetes mellitus type 2 in obese    Dyspnea    with exertion   Fatty liver    Fibroid    History of kidney stones 09/2019   HTN (hypertension)    Hyperlipidemia    Morbid obesity (HCC)    OSA on CPAP    PAT (paroxysmal atrial tachycardia)    Premature atrial contractions    PVC's (premature ventricular contractions)     Outpatient Medications Prior to Visit  Medication Sig Dispense Refill   allopurinol  (ZYLOPRIM ) 300 MG tablet Take 300 mg by mouth daily.     colchicine 0.6 MG tablet Take 0.6 mg by mouth daily as needed (gout flares).     diltiazem  (CARDIZEM  CD) 120 MG 24 hr capsule TAKE 1 CAPSULE BY MOUTH EVERY DAY 90 capsule 2   estradiol  (ESTRACE ) 0.1 MG/GM vaginal cream Place 1 Applicatorful vaginally 3 (three) times a week.     Evolocumab  (REPATHA  SURECLICK) 140 MG/ML SOAJ INJECT 1 PEN INTO THE SKIN EVERY 14 (FOURTEEN) DAYS. 2 mL 11   levalbuterol  (XOPENEX  HFA) 45 MCG/ACT inhaler Inhale 1-2 puffs into the lungs every 4 (four) hours as needed for shortness of breath.     lisinopril  (ZESTRIL ) 10 MG tablet Take 1 tablet (  10 mg total) by mouth daily. 90 tablet 3   metFORMIN  (GLUCOPHAGE -XR) 500 MG 24 hr tablet Take 500 mg by mouth daily with breakfast.     metoprolol  succinate (TOPROL -XL) 25 MG 24 hr tablet TAKE 1 TABLET (25 MG TOTAL) BY MOUTH DAILY. 90 tablet 3   Potassium Citrate  15 MEQ (1620 MG) TBCR Take 15 mEq by mouth 3 (three) times a week. Monday, Wednesday. Friday     PRESCRIPTION MEDICATION CPAP- At bedtime     tirzepatide (MOUNJARO) 15 MG/0.5ML Pen Inject 15 mg into the skin every Monday.     tiZANidine  (ZANAFLEX ) 4 MG tablet Take 0.5 tablets  (2 mg total) by mouth every 6 (six) hours as needed for muscle spasms. 30 tablet 0   acetaminophen  (TYLENOL ) 325 MG tablet Take 2 tablets (650 mg total) by mouth every 4 (four) hours as needed for mild pain (pain score 1-3), fever or moderate pain (pain score 4-6) (or Fever >/= 101).     D 1000 25 MCG (1000 UT) capsule Take 1,000 Units by mouth 3 (three) times a week. (Patient not taking: Reported on 03/13/2024)     feeding supplement (ENSURE PLUS HIGH PROTEIN) LIQD Take 237 mLs by mouth 2 (two) times daily between meals.     ferrous sulfate  325 (65 FE) MG tablet Take 1 tablet (325 mg total) by mouth daily with breakfast.     hyoscyamine  (ANASPAZ ) 0.125 MG TBDP disintergrating tablet Place 1 tablet (0.125 mg total) under the tongue every 6 (six) hours as needed for up to 20 doses. 20 tablet 0   ondansetron  (ZOFRAN ) 4 MG tablet Take 1 tablet (4 mg total) by mouth every 6 (six) hours as needed for nausea. 20 tablet 0   polyethylene glycol (MIRALAX  / GLYCOLAX ) 17 g packet Take 17 g by mouth daily.     senna-docusate (SENOKOT-S) 8.6-50 MG tablet Take 1 tablet by mouth daily.     tamsulosin  (FLOMAX ) 0.4 MG CAPS capsule Take 1 capsule (0.4 mg total) by mouth daily after supper. (Patient not taking: Reported on 03/13/2024) 30 capsule 0   No facility-administered medications prior to visit.     Allergies  Allergen Reactions   Erythromycin Diarrhea and Nausea And Vomiting    Other reaction(s): stomach upset   Latex Other (See Comments)    REACTION: CONTACT DERMATITIS   Nickel Other (See Comments)    Poor healing- dental reasons   Statins Other (See Comments)    Joint pain   Crestor  [Rosuvastatin ] Other (See Comments)    Cannot take when taking Colchicine   Dapagliflozin     Other reaction(s): worsening of urine incontinence   Hydrochlorothiazide     Previously discontinued due to uric acid kidney stones   Metformin  Hcl Diarrhea and Other (See Comments)    Diarrhea at higher doses, GI Upset    Potassium Citrate  Other (See Comments)    Affected eyes- redness and crusty at 30 mEq/daily; pt states she has no reaction to the potassium citrate  that she is taking now and that this only happened when she was taking too much   Zocor [Simvastatin] Other (See Comments)    Joint pain    Social History   Tobacco Use   Smoking status: Never   Smokeless tobacco: Never  Vaping Use   Vaping status: Never Used  Substance Use Topics   Alcohol use: Not Currently   Drug use: No    Family History  Problem Relation Age of Onset   Hypertension  Mother    Heart disease Father        Died age 85s of a cardiac event - was told it was atherosclerosis but also had PVCs   Healthy Sister    Arrhythmia Sister        PVCs   Depression Brother    Healthy Sister    Diabetes Paternal Grandmother    Arrhythmia Other        Distant relative with WPW.    Objective:   Vitals:   03/13/24 1547 03/13/24 1549  BP:  120/79  Pulse:  86  Resp:  16  Temp:  98.5 F (36.9 C)  TempSrc:  Oral  SpO2:  (!) 7%  Weight: 240 lb (108.9 kg)    Body mass index is 43.2 kg/m.  Physical Exam Constitutional:      Appearance: Normal appearance.  HENT:     Head: Normocephalic and atraumatic.     Right Ear: Tympanic membrane normal.     Left Ear: Tympanic membrane normal.     Nose: Nose normal.     Mouth/Throat:     Mouth: Mucous membranes are moist.  Eyes:     Extraocular Movements: Extraocular movements intact.     Conjunctiva/sclera: Conjunctivae normal.     Pupils: Pupils are equal, round, and reactive to light.  Cardiovascular:     Rate and Rhythm: Normal rate and regular rhythm.     Heart sounds: No murmur heard.    No friction rub. No gallop.  Pulmonary:     Effort: Pulmonary effort is normal.     Breath sounds: Normal breath sounds.  Abdominal:     General: Abdomen is flat.     Palpations: Abdomen is soft.  Musculoskeletal:        General: Normal range of motion.  Skin:    General: Skin  is warm and dry.  Neurological:     General: No focal deficit present.     Mental Status: She is alert and oriented to person, place, and time.  Psychiatric:        Mood and Affect: Mood normal.     Lab Results: Lab Results  Component Value Date   WBC 11.0 (H) 02/16/2024   HGB 11.1 (L) 02/16/2024   HCT 35.2 (L) 02/16/2024   MCV 91.7 02/16/2024   PLT 309 02/16/2024    Lab Results  Component Value Date   CREATININE 0.75 02/16/2024   BUN 19 02/16/2024   NA 141 02/16/2024   K 4.2 02/16/2024   CL 102 02/16/2024   CO2 28 02/16/2024    Lab Results  Component Value Date   ALT 27 02/10/2024   AST 24 02/10/2024   ALKPHOS 80 02/10/2024   BILITOT 0.6 02/10/2024     Assessment & Plan:  #Pyelonephritis in the setting of bilateral renal calculi #Recent history of left ureteral stent placement - Urine cultures grew Klebsiella pneumonia ESBL on 8/20.  - Blood culturesNG - Taken to OR on 8/26 with urology for cytoscopy, stone removal and stent placement. Reset the clock on abx x 2 weeks form OR. Transition to ertapenem  to complete abx eo t9/9 -PICC out -Denies urinary symtoms today.  Discussed avoiding obtaining urine cultures unless symptomatic as we do not want to treat asymptomatic bacteriuria or colonization if that occurs.  Consult to call clinic if patient has symptoms. - Follow-up with ID as needed   Loney Stank, MD [facility]   03/13/24  4:04 PM I have personally spent 45 minutes involved in face-to-face and non-face-to-face activities for this patient on the day of the visit. Professional time spent includes the following activities: Preparing to see the patient (review of tests), Obtaining and/or reviewing separately obtained history (admission/discharge record), Performing a medically appropriate examination and/or evaluation , Ordering medications/tests/procedures, referring and communicating with other health care  professionals, Documenting clinical information in the EMR, Independently interpreting results (not separately reported), Communicating results to the patient/family/caregiver, Counseling and educating the patient/family/caregiver and Care coordination (not separately reported).

## 2024-03-14 DIAGNOSIS — E785 Hyperlipidemia, unspecified: Secondary | ICD-10-CM | POA: Diagnosis not present

## 2024-03-14 DIAGNOSIS — I2510 Atherosclerotic heart disease of native coronary artery without angina pectoris: Secondary | ICD-10-CM | POA: Diagnosis not present

## 2024-03-14 DIAGNOSIS — E1165 Type 2 diabetes mellitus with hyperglycemia: Secondary | ICD-10-CM | POA: Diagnosis not present

## 2024-03-14 DIAGNOSIS — I10 Essential (primary) hypertension: Secondary | ICD-10-CM | POA: Diagnosis not present

## 2024-03-28 DIAGNOSIS — I2510 Atherosclerotic heart disease of native coronary artery without angina pectoris: Secondary | ICD-10-CM | POA: Diagnosis not present

## 2024-03-28 DIAGNOSIS — E7800 Pure hypercholesterolemia, unspecified: Secondary | ICD-10-CM | POA: Diagnosis not present

## 2024-03-28 DIAGNOSIS — Z79899 Other long term (current) drug therapy: Secondary | ICD-10-CM | POA: Diagnosis not present

## 2024-03-28 DIAGNOSIS — E785 Hyperlipidemia, unspecified: Secondary | ICD-10-CM | POA: Diagnosis not present

## 2024-03-28 LAB — LIPID PANEL

## 2024-03-29 ENCOUNTER — Ambulatory Visit: Payer: Self-pay | Admitting: Physician Assistant

## 2024-03-29 LAB — COMPREHENSIVE METABOLIC PANEL WITH GFR
ALT: 17 IU/L (ref 0–32)
AST: 15 IU/L (ref 0–40)
Albumin: 4.3 g/dL (ref 3.9–4.9)
Alkaline Phosphatase: 113 IU/L (ref 49–135)
BUN/Creatinine Ratio: 14 (ref 12–28)
BUN: 12 mg/dL (ref 8–27)
Bilirubin Total: 0.5 mg/dL (ref 0.0–1.2)
CO2: 23 mmol/L (ref 20–29)
Calcium: 9.6 mg/dL (ref 8.7–10.3)
Chloride: 104 mmol/L (ref 96–106)
Creatinine, Ser: 0.88 mg/dL (ref 0.57–1.00)
Globulin, Total: 2.3 g/dL (ref 1.5–4.5)
Glucose: 146 mg/dL — AB (ref 70–99)
Potassium: 4.1 mmol/L (ref 3.5–5.2)
Sodium: 143 mmol/L (ref 134–144)
Total Protein: 6.6 g/dL (ref 6.0–8.5)
eGFR: 73 mL/min/1.73 (ref >59)

## 2024-03-29 LAB — LIPID PANEL
Cholesterol, Total: 141 mg/dL (ref 100–199)
HDL: 43 mg/dL (ref >39)
LDL CALC COMMENT:: 3.3 ratio (ref 0.0–4.4)
LDL Chol Calc (NIH): 74 mg/dL (ref 0–99)
Triglycerides: 135 mg/dL (ref 0–149)
VLDL Cholesterol Cal: 24 mg/dL (ref 5–40)

## 2024-04-02 NOTE — Progress Notes (Unsigned)
 Cardiology Office Note    Date:  04/03/2024  ID:  Margrett Kalb Overfelt, DOB 06/08/1961, MRN [id number] PCP:  Aisha Harvey, MD  Cardiologist:  Wilbert Bihari, MD  Electrophysiologist:  None   Chief Complaint: f/u CAD, palpitations  History of Present Illness: .    Jaime Nichols is a 63 y.o. female with visit-pertinent history of OSA (followed by Dr. Bihari), HTN, chronic diastolic CHF, DM, nonsustained atrial tach, PACs, PVCs, nonobstructive CAD by cor CT 2020, aortic atherosclerosis, recurrent kidney stones, old granulomatous disease and fatty liver by prior imaging, family history of early CV disease, morbid obesity, possible asthma, Salmonella gastroenteritis, pyelonephritis, complicated UTIs who presents for follow-up.   She has a longstanding history of dyspnea with exertion and exaggerated HR response with activity even back to her 20s. Her SOB has been felt multifactorial from obesity, pectus excavatum and possible asthma, also followed by pulmonology. In 2019 she noted heart racing and dyspnea with minimal activity which was replicated in clinic. D-dimer was normal adjusted for age and stress test was reassuring. CXR showed borderline cardiomegaly, aortic atherosclerosis, calcified mediastinal lymph nodes, clear lungs. She did not want to titrate metoprolol  so diltiazem  was added which helped. She was counseled on need for weight loss. CPX showed normal functional capacity with significant limitation due to severe obesity and related restrictive properties. She had an exaggerated heart rate and hypertensive response to exercise with mild oxygen desaturations to a low of 91% at peak exercise. High res CT scan 11/2017 by pulmonology saw calcified lymph nodes but no evidence of interstitial lung disease. PFTs 12/2017 showed small airways obstruction, mild restriction with a low ERV, reported as can be seen in poor patient effort, neuromuscular weakness or obesity. Coronary CTA 08/2018 showed  CAC 846 in 99%ile but otherwise mild nonobstructive CAD. This also saw the calcified lymph nodes once more reported due to old granulomatous disease. Repeat nuclear stress test in 03/2020 was normal. She is not on ASA as it has previously tended to exacerbate her interstitial cystitis per patient report. She is not a candidate for SGLT2i due to recurrent UTIs with sepsis and prior fungal infections. She has worn several monitors in the past, with findings showing a combination of NSR, sinus tach, PVCs, PACs, and nonsustained atrial tach - the last of which in 04/2020 showed <1% PVCs. She is not on statin due to myalgias and drug interaction with colchicine, on PCSK9i instead. Hydrochlorothiazide previously stopped due to uric acid kidney stones. Venous duplex 11/2023 negative for DVT, + cystic structure. Carotid US  12/2023 unremarkable. Echo 12/2023 showed EF 55-60%, G2DD, elevated LAP, normal IVC. She has had multiple medical issues the last year with Salmonella gastroenteritis end of 2024 as well as sepsis due to UTI, last admitted 01/2024.  She is seen back for follow-up today. For a while after her illness her blood pressure was low but has since normalized and back on med regimen listed below. Palpitations have subsided. Continues to have mild asymmetric L foot edema in a foot she had a prior lisfranc fracture. No claudication, chest pain, new dyspnea. Notes generalized fatigue and brain fog.   Labwork independently reviewed: 03/2024 K 4.1, Cr 0.88, LFTs ok, LDL 74, trig 135 01/2024 Hgb 11.1 plt OK, Mg 2.2 11/2023 TSH OK    ROS: .    Please see the history of present illness. All other systems are reviewed and otherwise negative.  Studies Reviewed: SABRA    EKG:  EKG is ordered today,  personally reviewed, demonstrating  EKG Interpretation Date/Time:  Wednesday April 03 2024 15:54:16 EDT Ventricular Rate:  89 PR Interval:  188 QRS Duration:  92 QT Interval:  358 QTC Calculation: 435 R  Axis:   -81  Text Interpretation: Normal sinus rhythm Left axis deviation Low voltage QRS Septal infarct (cited on or before 03-Apr-2024) Possible Lateral infarct (cited on or before 03-Apr-2024) When compared with ECG of 30-Nov-2023 11:27, No significant change was found Confirmed by Nollan Muldrow [phone]) on 04/03/2024 4:28:16 PM    CV Studies: Cardiac studies reviewed are outlined and summarized above. Otherwise please see EMR for full report.   Current Reported Medications:.    Current Meds  Medication Sig   allopurinol  (ZYLOPRIM ) 300 MG tablet Take 300 mg by mouth daily.   colchicine 0.6 MG tablet Take 0.6 mg by mouth daily as needed (gout flares).   D 1000 25 MCG (1000 UT) capsule Take 1,000 Units by mouth 3 (three) times a week.   diltiazem  (CARDIZEM  CD) 120 MG 24 hr capsule TAKE 1 CAPSULE BY MOUTH EVERY DAY   Evolocumab  (REPATHA  SURECLICK) 140 MG/ML SOAJ INJECT 1 PEN INTO THE SKIN EVERY 14 (FOURTEEN) DAYS.   levalbuterol  (XOPENEX  HFA) 45 MCG/ACT inhaler Inhale 1-2 puffs into the lungs every 4 (four) hours as needed for shortness of breath.   lisinopril  (ZESTRIL ) 10 MG tablet Take 1 tablet (10 mg total) by mouth daily.   metFORMIN  (GLUCOPHAGE -XR) 500 MG 24 hr tablet Take 500 mg by mouth daily with breakfast.   metoprolol  succinate (TOPROL -XL) 25 MG 24 hr tablet TAKE 1 TABLET (25 MG TOTAL) BY MOUTH DAILY.   Potassium Citrate  15 MEQ (1620 MG) TBCR Take 15 mEq by mouth 3 (three) times a week. Monday, Wednesday. Friday   tirzepatide (MOUNJARO) 15 MG/0.5ML Pen Inject 15 mg into the skin every Monday.   tiZANidine  (ZANAFLEX ) 4 MG tablet Take 0.5 tablets (2 mg total) by mouth every 6 (six) hours as needed for muscle spasms.    Physical Exam:    VS:  BP 124/66   Pulse 89   Ht 5' 2.75 (1.594 m)   Wt 240 lb (108.9 kg)   BMI 42.85 kg/m    Wt Readings from Last 3 Encounters:  04/03/24 240 lb (108.9 kg)  03/13/24 240 lb (108.9 kg)  02/07/24 240 lb (108.9 kg)    GEN: Well nourished,  well developed in no acute distress NECK: No JVD; No carotid bruits CARDIAC: RRR, no murmurs, rubs, gallops RESPIRATORY:  Clear to auscultation without rales, wheezing or rhonchi  ABDOMEN: Soft, non-tender, non-distended EXTREMITIES:  Mild L foot pedal edema; No acute deformity. Preserved pedal hair with good pedal pulses bilaterally  Asessement and Plan:.    1. CAD, HLD - no recent accelerating angina. She is not on ASA due to prior exacerbation of IC as above. Regarding lipids, last LDL slightly above goal at 74 back on Repatha . We discussed referral back to lipid clinic to discuss additional options for treatment (?bempedoic acid) and she wishes to work on diet/activity for now, can reassess in follow-up.  2. Chronic HFpEF with HTN - appears stable on current regimen which includes diltiazem  120mg  daily, lisinopril  10mg  dailyy, Toprol  25mg  daily. Previously taken off hydrochlorothiazide due to kidney stones. Also previously trialed on low dose Lasix  which patient had held in setting of multiple medical illnesses. Left foot edema appears likely related to venous insufficiency possibly exacerbated prior injury to this foot with current habitus. She has seen orthopedics as  well. LE venous duplex this summer was negative for DVT. Offered referral to vascular surgery for venous reflux eval. She will let us  know if she would like to pursue. Discussed mainstay of therapy being compression, elevation, weight management. Note she is not a candidate for SGLT2i due to prior recurrent UTI with sepsis.  3. Palpitations with history of PACs, PVCs, PAT - quiescent. Continue current diltiazem  120mg  daily, Toprol  25mg  daily. Previously considered contribution of diltiazem  to edema but given that she previously did not wish to pursue increased dose of metoprolol , would hold course as she has done well on this combination otherwise. Do not necessarily want to provoke one problem trying to account for another,  especially when edema is mild and asymmetric/locallized to left foot.    Disposition: F/u with Dr. Shlomo 04/2024 as scheduled as she is due for OSA f/u. Also encouraged ongoing f/u PCP For generalized fatigue and brain fog.  Signed, Karianne Nogueira N Hakan Nudelman, PA-C

## 2024-04-03 ENCOUNTER — Ambulatory Visit: Attending: Physician Assistant | Admitting: Physician Assistant

## 2024-04-03 ENCOUNTER — Encounter: Payer: Self-pay | Admitting: Physician Assistant

## 2024-04-03 VITALS — BP 124/66 | HR 89 | Ht 62.75 in | Wt 240.0 lb

## 2024-04-03 DIAGNOSIS — E785 Hyperlipidemia, unspecified: Secondary | ICD-10-CM | POA: Diagnosis not present

## 2024-04-03 DIAGNOSIS — I5032 Chronic diastolic (congestive) heart failure: Secondary | ICD-10-CM | POA: Diagnosis not present

## 2024-04-03 DIAGNOSIS — I2510 Atherosclerotic heart disease of native coronary artery without angina pectoris: Secondary | ICD-10-CM | POA: Diagnosis not present

## 2024-04-03 DIAGNOSIS — I4719 Other supraventricular tachycardia: Secondary | ICD-10-CM

## 2024-04-03 DIAGNOSIS — R002 Palpitations: Secondary | ICD-10-CM

## 2024-04-03 DIAGNOSIS — I493 Ventricular premature depolarization: Secondary | ICD-10-CM

## 2024-04-03 DIAGNOSIS — I491 Atrial premature depolarization: Secondary | ICD-10-CM

## 2024-04-03 NOTE — Patient Instructions (Addendum)
 Medication Instructions:   Your physician recommends that you continue on your current medications as directed. Please refer to the Current Medication list given to you today.  *If you need a refill on your cardiac medications before your next appointment, please call your pharmacy*   Follow-Up:  AS SCHEDULED WITH DR. SHLOMO 05/06/24 AT 9 AM

## 2024-04-09 DIAGNOSIS — R399 Unspecified symptoms and signs involving the genitourinary system: Secondary | ICD-10-CM | POA: Diagnosis not present

## 2024-04-09 DIAGNOSIS — N200 Calculus of kidney: Secondary | ICD-10-CM | POA: Diagnosis not present

## 2024-04-11 DIAGNOSIS — M25532 Pain in left wrist: Secondary | ICD-10-CM | POA: Diagnosis not present

## 2024-04-12 ENCOUNTER — Encounter: Payer: Self-pay | Admitting: Cardiology

## 2024-04-12 ENCOUNTER — Telehealth: Payer: Self-pay | Admitting: Cardiology

## 2024-04-12 NOTE — Telephone Encounter (Signed)
 Patient stated she has to get a new CPAP machine because her machine broke.  Patient stated she will need to get a new prescription for the machine as her currently company does not have and upgrade to her machine.

## 2024-04-17 ENCOUNTER — Telehealth: Payer: Self-pay | Admitting: Cardiology

## 2024-04-17 NOTE — Telephone Encounter (Signed)
 Pt is trying to get an Rx for a new cpap. Please advise.

## 2024-04-18 DIAGNOSIS — M170 Bilateral primary osteoarthritis of knee: Secondary | ICD-10-CM | POA: Diagnosis not present

## 2024-04-18 NOTE — Telephone Encounter (Signed)
 Patient will bring her SD card to our office on 04/18/2024 to get a download for Dr Shlomo.

## 2024-04-18 NOTE — Telephone Encounter (Signed)
 Patient is following up. She would like an update if possible.

## 2024-04-23 ENCOUNTER — Ambulatory Visit: Payer: Self-pay | Admitting: Cardiology

## 2024-04-23 DIAGNOSIS — G4733 Obstructive sleep apnea (adult) (pediatric): Secondary | ICD-10-CM

## 2024-04-23 DIAGNOSIS — I2510 Atherosclerotic heart disease of native coronary artery without angina pectoris: Secondary | ICD-10-CM

## 2024-04-23 DIAGNOSIS — I10 Essential (primary) hypertension: Secondary | ICD-10-CM

## 2024-04-23 DIAGNOSIS — R002 Palpitations: Secondary | ICD-10-CM

## 2024-04-23 DIAGNOSIS — I5032 Chronic diastolic (congestive) heart failure: Secondary | ICD-10-CM

## 2024-04-23 DIAGNOSIS — I493 Ventricular premature depolarization: Secondary | ICD-10-CM

## 2024-04-25 DIAGNOSIS — M170 Bilateral primary osteoarthritis of knee: Secondary | ICD-10-CM | POA: Diagnosis not present

## 2024-04-26 NOTE — Telephone Encounter (Signed)
 Per Dr Shlomo, Good Apnea events and compliance.  Continue current PAP settings.

## 2024-04-29 NOTE — Telephone Encounter (Signed)
 Me to Shlomo Wilbert SAUNDERS, MD  Patient needs a new Rx for a cpap last appointment was 6/24. She wants to order it online.  Shlomo Wilbert SAUNDERS, MD to Me  Please give Rx for ResMed CPAP at 12.6cm H2O with heated humidity.

## 2024-04-29 NOTE — Telephone Encounter (Signed)
 Order for cpap has been written and will call the patient to see how she wants to receive her Rx. At patients request Rx emailed to her at [email or web address]

## 2024-04-30 DIAGNOSIS — L853 Xerosis cutis: Secondary | ICD-10-CM | POA: Diagnosis not present

## 2024-04-30 DIAGNOSIS — H0261 Xanthelasma of right upper eyelid: Secondary | ICD-10-CM | POA: Diagnosis not present

## 2024-04-30 DIAGNOSIS — D2261 Melanocytic nevi of right upper limb, including shoulder: Secondary | ICD-10-CM | POA: Diagnosis not present

## 2024-04-30 DIAGNOSIS — L738 Other specified follicular disorders: Secondary | ICD-10-CM | POA: Diagnosis not present

## 2024-05-02 ENCOUNTER — Telehealth: Payer: Self-pay

## 2024-05-02 NOTE — Telephone Encounter (Signed)
 SABRA

## 2024-05-03 NOTE — Telephone Encounter (Signed)
 Addressed in encounter opened 04/23/24.

## 2024-05-06 ENCOUNTER — Encounter: Payer: Self-pay | Admitting: Cardiology

## 2024-05-06 ENCOUNTER — Ambulatory Visit: Attending: Cardiology | Admitting: Cardiology

## 2024-05-06 VITALS — BP 120/58 | HR 79 | Ht 62.75 in | Wt 245.0 lb

## 2024-05-06 DIAGNOSIS — I4719 Other supraventricular tachycardia: Secondary | ICD-10-CM

## 2024-05-06 DIAGNOSIS — I5032 Chronic diastolic (congestive) heart failure: Secondary | ICD-10-CM

## 2024-05-06 DIAGNOSIS — I2510 Atherosclerotic heart disease of native coronary artery without angina pectoris: Secondary | ICD-10-CM

## 2024-05-06 DIAGNOSIS — I10 Essential (primary) hypertension: Secondary | ICD-10-CM

## 2024-05-06 DIAGNOSIS — G4733 Obstructive sleep apnea (adult) (pediatric): Secondary | ICD-10-CM

## 2024-05-06 DIAGNOSIS — E785 Hyperlipidemia, unspecified: Secondary | ICD-10-CM

## 2024-05-06 DIAGNOSIS — M79605 Pain in left leg: Secondary | ICD-10-CM

## 2024-05-06 NOTE — Addendum Note (Signed)
 Addended by: Robby Bulkley C on: 05/06/2024 09:37 AM   Modules accepted: Orders

## 2024-05-06 NOTE — Addendum Note (Signed)
 Addended by: Lanique Gonzalo C on: 05/06/2024 09:43 AM   Modules accepted: Orders

## 2024-05-06 NOTE — Patient Instructions (Addendum)
 Medication Instructions:  Your physician recommends that you continue on your current medications as directed. Please refer to the Current Medication list given to you today.   *If you need a refill on your cardiac medications before your next appointment, please call your pharmacy*  Lab Work: - Fasting lipid panel and ALT due around 08/06/24.  If you have labs (blood work) drawn today and your tests are completely normal, you will receive your results only by: MyChart Message (if you have MyChart) OR A paper copy in the mail If you have any lab test that is abnormal or we need to change your treatment, we will call you to review the results.  Testing/Procedures: Your physician has requested that you have a lower extremity arterial duplex. This test is an ultrasound of the arteries in the legs or arms. It looks at arterial blood flow in the legs and arms. Allow one hour for Lower and Upper Arterial scans. There are no restrictions or special instructions.  Please note: We ask at that you not bring children with you during ultrasound (echo/ vascular) testing. Due to room size and safety concerns, children are not allowed in the ultrasound rooms during exams. Our front office staff cannot provide observation of children in our lobby area while testing is being conducted. An adult accompanying a patient to their appointment will only be allowed in the ultrasound room at the discretion of the ultrasound technician under special circumstances. We apologize for any inconvenience.   Follow-Up: At [facility], you and your health needs are our priority.  As part of our continuing mission to provide you with exceptional heart care, our providers are all part of one team.  This team includes your primary Cardiologist (physician) and Advanced Practice Providers or APPs (Physician Assistants and Nurse Practitioners) who all work together to provide you with the care you need, when you need  it.  Your next appointment:   1 year(s)  Provider:   Wilbert Bihari, MD    We recommend signing up for the patient portal called MyChart.  Sign up information is provided on this After Visit Summary.  MyChart is used to connect with patients for Virtual Visits (Telemedicine).  Patients are able to view lab/test results, encounter notes, upcoming appointments, etc.  Non-urgent messages can be sent to your provider as well.   To learn more about what you can do with MyChart, go to [email or web address].

## 2024-05-06 NOTE — Progress Notes (Signed)
 Cardiology Office Note:    Date:  05/06/2024   ID:  Jaime Nichols, DOB Sep 19, 1960, MRN [id number]  PCP:  Aisha Harvey, MD  Cardiologist:  Wilbert Bihari, MD    Referring MD: Aisha Harvey, MD   Chief Complaint  Patient presents with   Coronary Artery Disease   Hypertension   Hyperlipidemia   Sleep Apnea     History of Present Illness:    Jaime Nichols is a 63 y.o. female with a hx of OSA on CPAP, obesity, chronic diastolic CHF, PAT and HTN. She has chronic SOB that is multifactorial from obesity, pectus excavatum and possible asthma, followed by Dr. Loanne.   She is here today and is doing well. She has chronic DOE related to deconditioning and morbid obesity.  She denies any chest pain or pressure, PND, orthopnea, dizziness, palpitations or syncope. She has had some edema in her left foot since April after she went to [facility] and got a bunch of bug bites. She also had an injury a long time ago to that foot.  She saw her foot MD and he said it was arthritis.  She recently got a new PAP device with an Airsense 11 and is doing well with it.  She tolerates the nasal mask and feels the pressure is adequate.  Since going on PAP she feels rested in the am but does get some fatigue during the day but thinks it is not related to her OSA.  She denies any significant mouth or nasal dryness or nasal congestion.  Patient denies any episodes of bruxism, restless legs, No gagging hallucinations or cataplectic events.    Past Medical History:  Diagnosis Date   Aortic atherosclerosis    Arthritis    CAD in native artery    very high calcium  score at 875.  coronary CTA  showed 30% RCA and LAD.   Chronic diastolic CHF (congestive heart failure) (HCC) 2012   Diabetes mellitus type 2 in obese    Dyspnea    with exertion   Fatty liver    Fibroid    History of kidney stones 09/2019   HTN (hypertension)    Hyperlipidemia    Morbid obesity (HCC)    OSA on CPAP    PAT (paroxysmal  atrial tachycardia)    Premature atrial contractions    PVC's (premature ventricular contractions)     Past Surgical History:  Procedure Laterality Date   ABDOMINAL HYSTERECTOMY  06/20/2008   BSO   Benign uterine polyps  01/18/2009   cholwecystectomy      CYSTOSCOPY W/ URETERAL STENT PLACEMENT Right 07/02/2021   Procedure: CYSTOSCOPY WITH RETROGRADE PYELOGRAM/URETERAL STENT PLACEMENT;  Surgeon: Rosalind Zachary NOVAK, MD;  Location: [facility];  Service: Urology;  Laterality: Right;   CYSTOSCOPY/RETROGRADE/URETEROSCOPY Left 01/01/2024   Procedure: CYSTOSCOPY/RETROGRADE/STENT PLACEMENT;  Surgeon: Shane Steffan BROCKS, MD;  Location: [facility];  Service: Urology;  Laterality: Left;  WITH LEFT URETERAL STENT PLACEMENT   CYSTOSCOPY/URETEROSCOPY/HOLMIUM LASER/STENT PLACEMENT Right 07/28/2021   Procedure: RIGHT URETEROSCOPY/ RETROGRADE / HOLMIUM LASER/ STONE BASKETRY, STENT EXCHANGE;  Surgeon: Cam Morene ORN, MD;  Location: [facility];  Service: Urology;  Laterality: Right;   CYSTOSCOPY/URETEROSCOPY/HOLMIUM LASER/STENT PLACEMENT Bilateral 02/13/2024   Procedure: BILATERAL URETEROSCOPY, HOLIUM LASER, RETROGRADE PYLOGRAM, STENT;  Surgeon: Shane Steffan BROCKS, MD;  Location: [facility];  Service: Urology;  Laterality: Bilateral;   DILATION AND CURETTAGE OF UTERUS  2010   EXTRACORPOREAL SHOCK WAVE LITHOTRIPSY Left 06/05/2023   Procedure: LEFT EXTRACORPOREAL SHOCK WAVE  LITHOTRIPSY (ESWL);  Surgeon: Alvaro Ricardo KATHEE Mickey., MD;  Location: [facility];  Service: Urology;  Laterality: Left;   HYSTEROSCOPY  2010   TONSILLECTOMY     URETEROSCOPY WITH HOLMIUM LASER LITHOTRIPSY Left 2021    Current Medications: Current Meds  Medication Sig   acetaminophen  (TYLENOL ) 325 MG tablet Take 2 tablets (650 mg total) by mouth every 4 (four) hours as needed for mild pain (pain score 1-3), fever or moderate pain (pain score 4-6) (or Fever >/= 101).   allopurinol  (ZYLOPRIM ) 300 MG tablet Take 300 mg  by mouth daily.   colchicine 0.6 MG tablet Take 0.6 mg by mouth daily as needed (gout flares).   D 1000 25 MCG (1000 UT) capsule Take 1,000 Units by mouth 3 (three) times a week.   diltiazem  (CARDIZEM  CD) 120 MG 24 hr capsule TAKE 1 CAPSULE BY MOUTH EVERY DAY   estradiol  (ESTRACE ) 0.1 MG/GM vaginal cream Place 1 Applicatorful vaginally 3 (three) times a week.   Evolocumab  (REPATHA  SURECLICK) 140 MG/ML SOAJ INJECT 1 PEN INTO THE SKIN EVERY 14 (FOURTEEN) DAYS.   levalbuterol  (XOPENEX  HFA) 45 MCG/ACT inhaler Inhale 1-2 puffs into the lungs every 4 (four) hours as needed for shortness of breath.   lisinopril  (ZESTRIL ) 10 MG tablet Take 1 tablet (10 mg total) by mouth daily.   metFORMIN  (GLUCOPHAGE -XR) 500 MG 24 hr tablet Take 500 mg by mouth daily with breakfast.   metoprolol  succinate (TOPROL -XL) 25 MG 24 hr tablet TAKE 1 TABLET (25 MG TOTAL) BY MOUTH DAILY.   Potassium Citrate  15 MEQ (1620 MG) TBCR Take 15 mEq by mouth 3 (three) times a week. Monday, Wednesday. Friday   PRESCRIPTION MEDICATION CPAP- At bedtime   tirzepatide (MOUNJARO) 15 MG/0.5ML Pen Inject 15 mg into the skin every Monday.   valACYclovir (VALTREX) 1000 MG tablet Take 2,000 mg by mouth 2 (two) times daily. (Patient taking differently: Take 1,000 mg by mouth as needed.)     Allergies:   Erythromycin, Latex, Nickel, Statins, Crestor  [rosuvastatin ], Dapagliflozin, Hydrochlorothiazide, Metformin  hcl, Potassium citrate , and Zocor [simvastatin]   Social History   Socioeconomic History   Marital status: Married    Spouse name: Not on file   Number of children: Not on file   Years of education: Not on file   Highest education level: Not on file  Occupational History   Not on file  Tobacco Use   Smoking status: Never   Smokeless tobacco: Never  Vaping Use   Vaping status: Never Used  Substance and Sexual Activity   Alcohol use: Not Currently   Drug use: No   Sexual activity: Yes    Partners: Male    Birth  control/protection: Surgical    Comment: hyst  Other Topics Concern   Not on file  Social History Narrative   Not on file   Social Drivers of Health   Financial Resource Strain: Not on file  Food Insecurity: No Food Insecurity (02/08/2024)   Hunger Vital Sign    Worried About Running Out of Food in the Last Year: Never true    Ran Out of Food in the Last Year: Never true  Transportation Needs: No Transportation Needs (02/08/2024)   PRAPARE - Administrator, Civil Service (Medical): No    Lack of Transportation (Non-Medical): No  Physical Activity: Not on file  Stress: Not on file  Social Connections: Unknown (10/31/2021)   Received from [facility]   Social Network    Social  Network: Not on file     Family History: The patient's family history includes Arrhythmia in her sister and another family member; Depression in her brother; Diabetes in her paternal grandmother; Healthy in her sister and sister; Heart disease in her father; Hypertension in her mother.  ROS:   Please see the history of present illness.    ROS  All other systems reviewed and negative.   EKGs/Labs/Other Studies Reviewed:    The following studies were reviewed today: PAP compliance download  Recent Labs: 12/13/2023: BNP 12.2; TSH 2.240 02/15/2024: Magnesium  2.2 02/16/2024: Hemoglobin 11.1; Platelets 309 03/28/2024: ALT 17; BUN 12; Creatinine, Ser 0.88; Potassium 4.1; Sodium 143   Recent Lipid Panel    Component Value Date/Time   CHOL 141 03/28/2024 1207   TRIG 135 03/28/2024 1207   HDL 43 03/28/2024 1207   CHOLHDL 3.3 03/28/2024 1207   LDLCALC 74 03/28/2024 1207    Physical Exam:    VS:  BP (!) 120/58 (BP Location: Right Arm, Patient Position: Sitting, Cuff Size: Large)   Pulse 79   Ht 5' 2.75 (1.594 m)   Wt 245 lb (111.1 kg)   SpO2 97%   BMI 43.75 kg/m     Wt Readings from Last 3 Encounters:  05/06/24 245 lb (111.1 kg)  04/03/24 240 lb (108.9 kg)  03/13/24 240 lb (108.9 kg)     GEN: Well nourished, well developed in no acute distress HEENT: Normal NECK: No JVD; No carotid bruits LYMPHATICS: No lymphadenopathy CARDIAC:RRR, no murmurs, rubs, gallops RESPIRATORY:  Clear to auscultation without rales, wheezing or rhonchi  ABDOMEN: Soft, non-tender, non-distended MUSCULOSKELETAL:  No edema; No deformity  SKIN: Warm and dry NEUROLOGIC:  Alert and oriented x 3 PSYCHIATRIC:  Normal affect  ASSESSMENT:    1. Chronic diastolic CHF (congestive heart failure) (HCC)   2. Essential hypertension   3. PAT (paroxysmal atrial tachycardia)   4. OSA (obstructive sleep apnea)   5. CAD in native artery   6. Hyperlipidemia LDL goal <70     PLAN:    In order of problems listed above:  1.  Chronic diastolic CHF  -She is euvolemic on exam today -she has chronic SOB related to allergies, Obesity and asthma  -I think a large part of her DOE is related to obesity and deconditioning -She has not required any diuretic therapy   2.  Hypertension  -Blood pressure controlled on exam today 120/58 mmHg -Continue Cardizem  CD 120 mg daily, Zestril  10 mg daily and Toprol  XL 25 mg daily with as needed refills -I have personally reviewed and interpreted outside labs performed by patient's PCP which showed serum creatinine 0.88 and potassium 4.1 on 03/28/2024  3.  PAT  -Repeat event monitor 04/2020 showed occasional PVCs -Denies any palpitations -Continue Cardizem  CD 220 mg daily and Toprol -XL 25 mg daily with as needed refills  4. OSA - The patient is tolerating PAP therapy well without any problems. The PAP download performed by his DME was personally reviewed and interpreted by me today and showed an AHI of 0.4 /hr on 12.6 cm H2O with 23 % compliance in using more than 4 hours nightly.  The patient has been using and benefiting from PAP use and will continue to benefit from therapy.  - I have encouraged her be more compliant with her device  5. ASCAD -coronary CTA  showed 30% RCA  and LAD.   -She has not had any anginal symptoms since I saw her last -Continue prescription management  with Toprol -XL 25 mg daily, Repatha  -She is statin intolerant -Nuclear stress test 2019 and 2021 showed no ischemia   6.  Hyperlipidemia  -her LDL goal is less than 70.   -Continue Repatha  140 mg every 14 days with as needed refills -I have personally reviewed and interpreted outside labs performed by patient's PCP which showed LDL 74, HDL 43, triglycerides 135, ALT 17 on 03/28/2024 -I encouraged her to try work on diet more and repeat in 3 months  7.  Class 3 Morbid Obesity with BMI 45 and comorbidities including OSA and HTN - Currently on Mounjaro  8.  RLE edema -this is mainly in her left foot but saw her ortho and the felt it was arthritis due to remote surgery on her foot -LE venous dopplers 11/2023 showed bakers cyst on the left but no DVT -check arterial doppler in the left leg   Medication Adjustments/Labs and Tests Ordered: Current medicines are reviewed at length with the patient today.  Concerns regarding medicines are outlined above.  No orders of the defined types were placed in this encounter.  Followup with me in 1 year  Signed, Wilbert Bihari, MD  05/06/2024 9:09 AM    [facility]

## 2024-05-10 NOTE — Addendum Note (Signed)
 Addended by: Shahiem Bedwell C on: 05/10/2024 09:38 AM   Modules accepted: Orders

## 2024-05-22 ENCOUNTER — Encounter (HOSPITAL_COMMUNITY): Payer: Self-pay

## 2024-05-22 ENCOUNTER — Ambulatory Visit (HOSPITAL_COMMUNITY)
Admission: RE | Admit: 2024-05-22 | Discharge: 2024-05-22 | Disposition: A | Discharge status: Home / Self Care | Source: Ambulatory Visit | Attending: Cardiology | Admitting: Cardiology

## 2024-05-22 ENCOUNTER — Ambulatory Visit (HOSPITAL_COMMUNITY)
Admission: RE | Admit: 2024-05-22 | Discharge: 2024-05-22 | Disposition: A | Source: Ambulatory Visit | Attending: Cardiology | Admitting: Cardiology

## 2024-05-22 DIAGNOSIS — M79605 Pain in left leg: Secondary | ICD-10-CM

## 2024-05-23 ENCOUNTER — Ambulatory Visit: Payer: Self-pay | Admitting: Cardiology

## 2024-05-23 LAB — VAS US ABI WITH/WO TBI
Left ABI: 1.18
Right ABI: 1.21

## 2024-05-28 ENCOUNTER — Other Ambulatory Visit: Payer: Self-pay

## 2024-05-29 ENCOUNTER — Ambulatory Visit: Admitting: Physician Assistant

## 2024-06-03 ENCOUNTER — Ambulatory Visit: Admitting: Physician Assistant

## 2024-06-24 ENCOUNTER — Other Ambulatory Visit: Payer: Self-pay | Admitting: Cardiology

## 2024-06-24 DIAGNOSIS — I2510 Atherosclerotic heart disease of native coronary artery without angina pectoris: Secondary | ICD-10-CM

## 2024-06-24 DIAGNOSIS — E785 Hyperlipidemia, unspecified: Secondary | ICD-10-CM

## 2024-06-25 ENCOUNTER — Telehealth: Payer: Self-pay | Admitting: Pharmacy Technician

## 2024-06-25 ENCOUNTER — Encounter: Payer: Self-pay | Admitting: Cardiology

## 2024-06-25 ENCOUNTER — Other Ambulatory Visit (HOSPITAL_COMMUNITY): Payer: Self-pay

## 2024-06-25 NOTE — Telephone Encounter (Signed)
 Pharmacy Patient Advocate Encounter   Received notification from Patient Advice Request messages that prior authorization for REPATHA  is required/requested.   Insurance verification completed.   The patient is insured through [place].   Per test claim: PA required; PA submitted to above mentioned insurance via Latent Key/confirmation #/EOC [id number] Status is pending

## 2024-06-25 NOTE — Telephone Encounter (Signed)
 Pharmacy Patient Advocate Encounter   Received notification from Patient Advice Request messages that prior authorization for repatha  is required/requested.   Insurance verification completed.   The patient is insured through [place].   Per test claim: PA required; PA started via CoverMyMeds. KEY [id number] . Waiting for clinical questions to populate.

## 2024-06-26 NOTE — Telephone Encounter (Signed)
 Pharmacy Patient Advocate Encounter  Received notification from [employer] that Prior Authorization for repatha  has been APPROVED from 06/20/24 to 06/19/25   PA #/Case ID/Reference #: [id number]

## 2024-06-27 NOTE — Telephone Encounter (Signed)
 Spoke to patient and discuss the appropriate dose. Will start taking every 14 days now on.

## 2024-06-27 NOTE — Telephone Encounter (Signed)
 I will defer to pharmD on this as routed. Appreciate their input how these are typically advised.

## 2024-07-06 ENCOUNTER — Other Ambulatory Visit (HOSPITAL_BASED_OUTPATIENT_CLINIC_OR_DEPARTMENT_OTHER): Payer: Self-pay | Admitting: Family Medicine

## 2024-07-06 DIAGNOSIS — M8588 Other specified disorders of bone density and structure, other site: Secondary | ICD-10-CM
